# Patient Record
Sex: Female | Born: 1935
Health system: Southern US, Community
[De-identification: ages and names within clinical notes are randomized; demographics above are authoritative.]

## PROBLEM LIST (undated history)

## (undated) DIAGNOSIS — D649 Anemia, unspecified: Secondary | ICD-10-CM

## (undated) DIAGNOSIS — I4892 Unspecified atrial flutter: Secondary | ICD-10-CM

## (undated) DIAGNOSIS — I10 Essential (primary) hypertension: Secondary | ICD-10-CM

## (undated) DIAGNOSIS — I701 Atherosclerosis of renal artery: Secondary | ICD-10-CM

## (undated) DIAGNOSIS — M1990 Unspecified osteoarthritis, unspecified site: Secondary | ICD-10-CM

## (undated) DIAGNOSIS — I4891 Unspecified atrial fibrillation: Secondary | ICD-10-CM

## (undated) DIAGNOSIS — R002 Palpitations: Secondary | ICD-10-CM

## (undated) DIAGNOSIS — E039 Hypothyroidism, unspecified: Secondary | ICD-10-CM

## (undated) HISTORY — DX: Unspecified osteoarthritis, unspecified site: M19.90

## (undated) HISTORY — DX: Unspecified atrial fibrillation: I48.91

## (undated) HISTORY — DX: Palpitations: R00.2

## (undated) HISTORY — DX: Hypothyroidism, unspecified: E03.9

## (undated) HISTORY — PX: TUBAL LIGATION: SHX77

## (undated) HISTORY — DX: Unspecified atrial flutter: I48.92

## (undated) HISTORY — DX: Anemia, unspecified: D64.9

## (undated) HISTORY — PX: KNEE ARTHROSCOPY: SUR90

## (undated) HISTORY — PX: A FLUTTER ABLATION: SHX5348

## (undated) HISTORY — DX: Essential (primary) hypertension: I10

---

## 1997-05-11 ENCOUNTER — Other Ambulatory Visit: Admission: RE | Admit: 1997-05-11 | Discharge: 1997-05-11 | Payer: Self-pay | Admitting: Obstetrics and Gynecology

## 1998-05-31 ENCOUNTER — Other Ambulatory Visit: Admission: RE | Admit: 1998-05-31 | Discharge: 1998-05-31 | Payer: Self-pay | Admitting: Obstetrics and Gynecology

## 1999-06-01 ENCOUNTER — Other Ambulatory Visit: Admission: RE | Admit: 1999-06-01 | Discharge: 1999-06-01 | Payer: Self-pay | Admitting: Obstetrics and Gynecology

## 2000-06-04 ENCOUNTER — Other Ambulatory Visit: Admission: RE | Admit: 2000-06-04 | Discharge: 2000-06-04 | Payer: Self-pay | Admitting: Obstetrics and Gynecology

## 2001-05-15 ENCOUNTER — Ambulatory Visit (HOSPITAL_COMMUNITY): Admission: RE | Admit: 2001-05-15 | Discharge: 2001-05-15 | Payer: Self-pay | Admitting: Cardiology

## 2003-12-06 ENCOUNTER — Ambulatory Visit: Payer: Self-pay | Admitting: Cardiology

## 2004-04-09 ENCOUNTER — Ambulatory Visit: Payer: Self-pay | Admitting: Internal Medicine

## 2004-04-17 ENCOUNTER — Ambulatory Visit: Payer: Self-pay | Admitting: Internal Medicine

## 2004-05-24 ENCOUNTER — Ambulatory Visit: Payer: Self-pay | Admitting: Internal Medicine

## 2004-07-09 ENCOUNTER — Ambulatory Visit: Payer: Self-pay | Admitting: Internal Medicine

## 2005-05-16 ENCOUNTER — Ambulatory Visit: Payer: Self-pay | Admitting: Internal Medicine

## 2005-06-05 ENCOUNTER — Ambulatory Visit: Payer: Self-pay | Admitting: Cardiology

## 2005-06-05 ENCOUNTER — Ambulatory Visit: Payer: Self-pay | Admitting: Internal Medicine

## 2005-06-07 ENCOUNTER — Ambulatory Visit: Payer: Self-pay

## 2005-06-27 ENCOUNTER — Ambulatory Visit: Payer: Self-pay | Admitting: Internal Medicine

## 2005-07-04 ENCOUNTER — Ambulatory Visit (HOSPITAL_COMMUNITY): Admission: RE | Admit: 2005-07-04 | Discharge: 2005-07-04 | Payer: Self-pay | Admitting: Cardiovascular Disease

## 2005-12-03 ENCOUNTER — Ambulatory Visit: Payer: Self-pay | Admitting: *Deleted

## 2005-12-27 ENCOUNTER — Ambulatory Visit: Payer: Self-pay | Admitting: Internal Medicine

## 2006-01-30 ENCOUNTER — Ambulatory Visit (HOSPITAL_BASED_OUTPATIENT_CLINIC_OR_DEPARTMENT_OTHER): Admission: RE | Admit: 2006-01-30 | Discharge: 2006-01-30 | Payer: Self-pay | Admitting: Orthopedic Surgery

## 2006-04-04 ENCOUNTER — Ambulatory Visit: Payer: Self-pay | Admitting: Internal Medicine

## 2006-04-07 ENCOUNTER — Ambulatory Visit: Payer: Self-pay | Admitting: Internal Medicine

## 2006-04-10 ENCOUNTER — Ambulatory Visit: Payer: Self-pay | Admitting: Cardiology

## 2006-04-17 ENCOUNTER — Ambulatory Visit: Payer: Self-pay | Admitting: Cardiology

## 2006-04-23 ENCOUNTER — Ambulatory Visit: Payer: Self-pay | Admitting: Cardiovascular Disease

## 2006-04-30 ENCOUNTER — Ambulatory Visit: Payer: Self-pay | Admitting: Internal Medicine

## 2006-04-30 LAB — CONVERTED CEMR LAB
BUN: 18 mg/dL (ref 6–23)
Calcium: 9.7 mg/dL (ref 8.4–10.5)
Eosinophils Absolute: 0 10*3/uL (ref 0.0–0.6)
GFR calc non Af Amer: 52 mL/min
HCT: 33.7 % — ABNORMAL LOW (ref 36.0–46.0)
Hemoglobin: 11.7 g/dL — ABNORMAL LOW (ref 12.0–15.0)
Monocytes Absolute: 0.6 10*3/uL (ref 0.2–0.7)
Monocytes Relative: 8.6 % (ref 3.0–11.0)
Neutrophils Relative %: 67 % (ref 43.0–77.0)
Prothrombin Time: 20.8 s — ABNORMAL HIGH (ref 10.0–14.0)
RBC: 3.65 M/uL — ABNORMAL LOW (ref 3.87–5.11)
Sodium: 141 meq/L (ref 135–145)

## 2006-05-07 ENCOUNTER — Ambulatory Visit: Payer: Self-pay | Admitting: Cardiology

## 2006-05-09 ENCOUNTER — Ambulatory Visit (HOSPITAL_COMMUNITY): Admission: RE | Admit: 2006-05-09 | Discharge: 2006-05-10 | Payer: Self-pay | Admitting: Internal Medicine

## 2006-05-09 ENCOUNTER — Ambulatory Visit: Payer: Self-pay | Admitting: Internal Medicine

## 2006-05-12 ENCOUNTER — Ambulatory Visit: Payer: Self-pay | Admitting: Cardiology

## 2006-05-21 ENCOUNTER — Ambulatory Visit: Payer: Self-pay | Admitting: Internal Medicine

## 2006-06-06 ENCOUNTER — Ambulatory Visit: Payer: Self-pay | Admitting: Cardiology

## 2006-06-13 ENCOUNTER — Ambulatory Visit: Payer: Self-pay | Admitting: Internal Medicine

## 2006-07-03 ENCOUNTER — Ambulatory Visit: Payer: Self-pay | Admitting: Internal Medicine

## 2006-09-09 IMAGING — CT CT HEART WO/W CTA ONLY W/ CA
1 of 2 series · 8 of 20 positions shown, 10 images · IV contrast (APPLIED)
Comparison: none

CLINICAL DATA: 69-year-old female followed by Dr. Bushula, with history of paroxysmal atrial tachycardia for which she was on Flecainide.  Recent myocardial perfusion study showed possible reversibility in the anterior and apical wall.  She has a history of a normal coronary catheterization in Saturday April, 2001.
CARDIAC CT (INCLUDING CORONARY CALCIUM SCORING, CORONARY ARTERY CTA, AND LEFT VENTRICULAR FUNCTION EVALUATION) ? 07/04/05:
TECHNIQUE: The patient received 50 mg of oral Toprol XL on the night prior to and the morning of the examination.  Due to her PAT, she required 12.5 mg of intravenous Lopressor and was also given 150 mg of intravenous amiodarone prior to imaging.  0.4 mg of sublingual nitroglycerin was administered immediately prior to imaging.  The heart rate was in the low 70?s, and several premature atrial contractions occurred during the imaging sequence. 
After an AP topogram, unenhanced CT through the heart was performed at 3-mm collimation for the purposes of coronary calcium scoring.  20 cc of Omnipaque 350 were then utilized for a test bolus, with the region of interest curve drawn in the ascending aorta just distal to the coronary origins.  80 cc of Omnipaque 350 was then used for the coronary CTA, followed by a saline chaser bolus (100 cc Omnipaque 350 total).  Gantry rotation speed was 330 milliseconds, and 0.6-mm collimation was utilized with 0.4-mm overlap.

[Series 6: soft tissue · axial · 0.52mm/px · z∈[-265,-139]mm · 8 of 54 slices shown, 10 images]
[im 6/54  vessel]
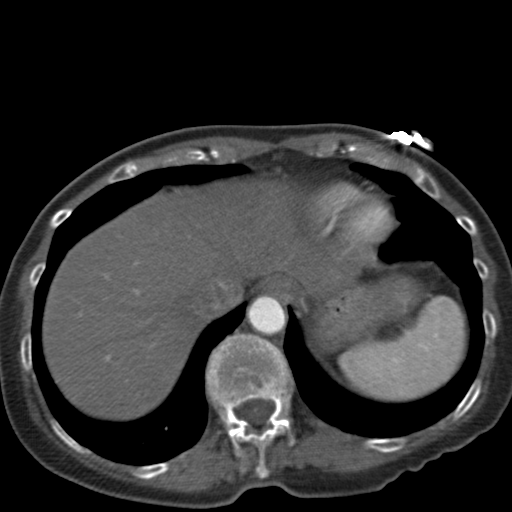
[im 6/54  lung]
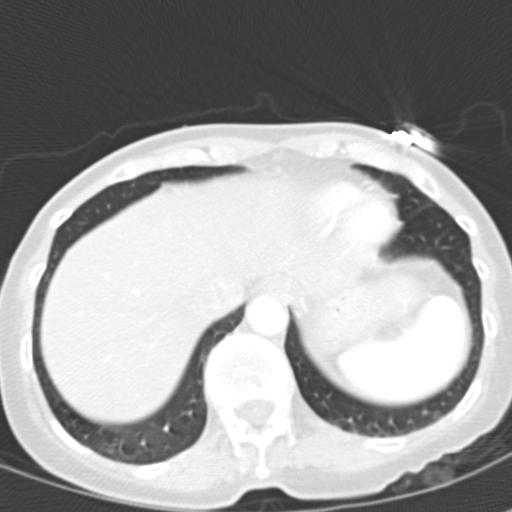
[im 12/54  vessel]
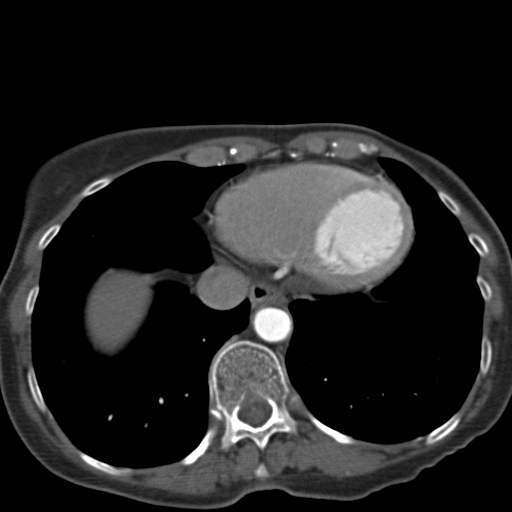
[im 18/54  vessel]
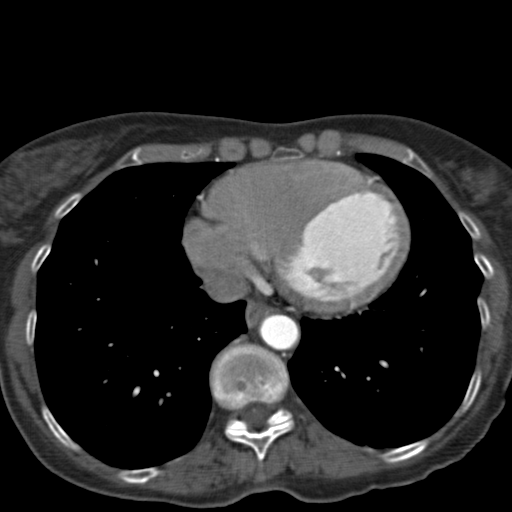
[im 24/54  vessel]
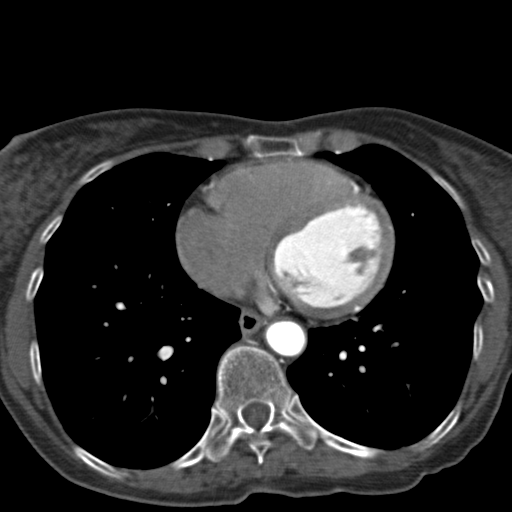
[im 30/54  vessel]
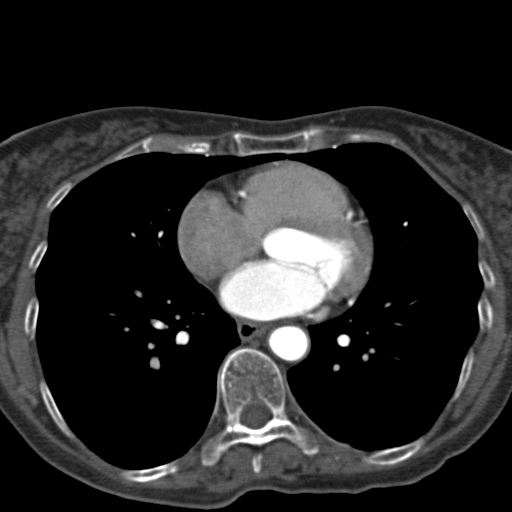
[im 30/54  lung]
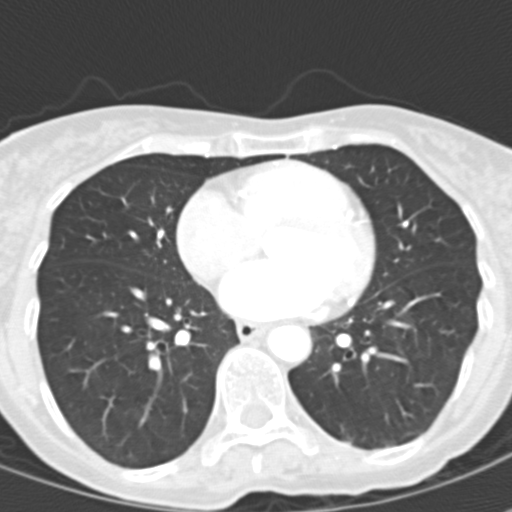
[im 36/54  vessel]
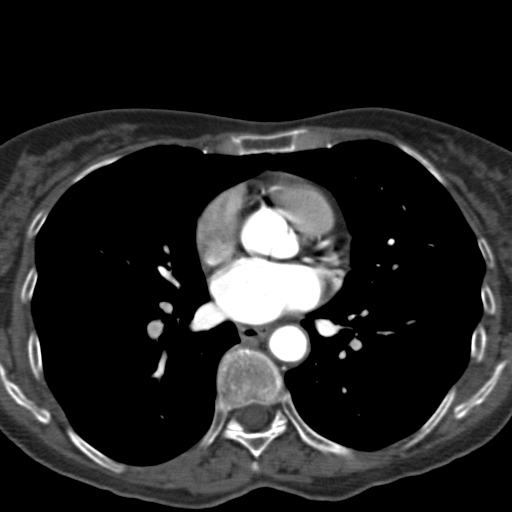
[im 42/54  vessel]
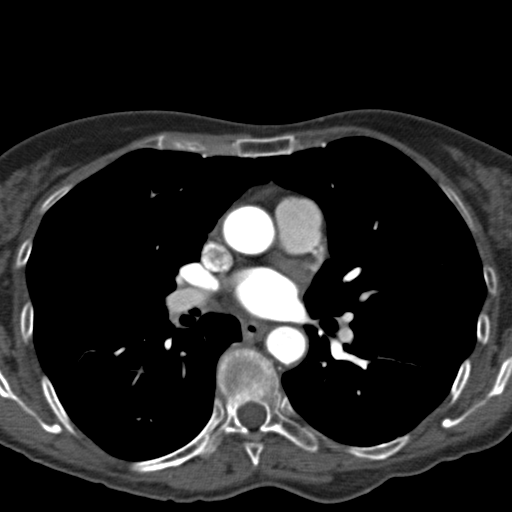
[im 48/54  vessel]
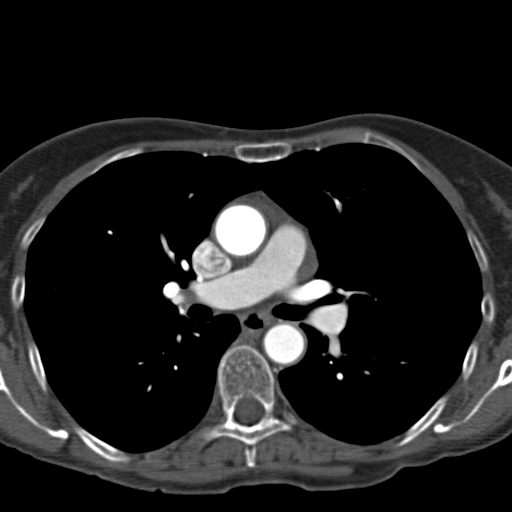

[8 of 20 positions shown; findings below may reference images not displayed]

FINDINGS: Coronary calcium score measured 0. 
Coronary CTA:  Because of the multiple premature atrial contractions, some misregistration artifact occurred, but a diagnostic study was obtained.  
The left main coronary artery arises normally from the left coronary cusp.  The left anterior descending artery is widely patent.  There are two diagonal branches arising from the LAD, which are normal. 
The left circumflex coronary artery is free of significant disease.  There are two obtuse marginals, which are also normal. 
The right coronary artery is widely patent throughout its course.  I believe a small posterior descending artery arises from the right coronary artery, making her right dominant. 
The aortic valve is trileaflet, and there is no evidence of aortic stenosis.  The left atrial appendage is normal in appearance.  There is an accessory right upper pulmonary vein.
LV function:  Time volume analysis estimated the resting left ventricular ejection fraction at 46%.
Extracardiac findings:  There is no significant lymphadenopathy in the visualized mediastinum.  The visualized upper abdomen is normal for the early arterial phase of enhancement.  The visualized lungs are clear, and there are no pleural effusions.
IMPRESSION: 1.  Coronary artery calcium Agatston score of 0.
2.  Normal appearing coronary arteries throughout. 
3.  Estimated resting left ventricular ejection fraction 46% with normal wall motion. 
4.  Accessory right upper pulmonary vein. 
This examination was performed in conjunction with Dr. Renars Unwin of [place] [facility]. [REDACTED] to [place] this study.

## 2006-11-13 ENCOUNTER — Ambulatory Visit: Payer: Self-pay | Admitting: Internal Medicine

## 2006-12-05 ENCOUNTER — Ambulatory Visit: Payer: Self-pay | Admitting: Internal Medicine

## 2007-01-20 ENCOUNTER — Ambulatory Visit: Payer: Self-pay | Admitting: Cardiovascular Disease

## 2007-02-12 ENCOUNTER — Ambulatory Visit: Payer: Self-pay | Admitting: Internal Medicine

## 2007-11-30 ENCOUNTER — Ambulatory Visit: Payer: Self-pay | Admitting: Internal Medicine

## 2007-11-30 LAB — CONVERTED CEMR LAB
Basophils Relative: 0.6 % (ref 0.0–3.0)
Eosinophils Relative: 1.4 % (ref 0.0–5.0)
Hemoglobin: 11.1 g/dL — ABNORMAL LOW (ref 12.0–15.0)
Lymphocytes Relative: 23.4 % (ref 12.0–46.0)
MCV: 93.4 fL (ref 78.0–100.0)
Monocytes Absolute: 0.3 10*3/uL (ref 0.1–1.0)
Monocytes Relative: 3.9 % (ref 3.0–12.0)
Neutro Abs: 5.4 10*3/uL (ref 1.4–7.7)
Platelets: 183 10*3/uL (ref 150–400)
RBC: 3.38 M/uL — ABNORMAL LOW (ref 3.87–5.11)
TSH: 2.15 microintl units/mL (ref 0.35–5.50)

## 2008-04-01 ENCOUNTER — Ambulatory Visit: Payer: Self-pay | Admitting: Internal Medicine

## 2008-05-04 ENCOUNTER — Other Ambulatory Visit: Payer: Self-pay | Admitting: Internal Medicine

## 2008-06-22 ENCOUNTER — Telehealth: Payer: Self-pay | Admitting: Internal Medicine

## 2008-08-02 ENCOUNTER — Telehealth: Payer: Self-pay | Admitting: Internal Medicine

## 2008-09-03 DIAGNOSIS — I10 Essential (primary) hypertension: Secondary | ICD-10-CM | POA: Insufficient documentation

## 2008-09-03 DIAGNOSIS — Z9889 Other specified postprocedural states: Secondary | ICD-10-CM | POA: Insufficient documentation

## 2008-09-03 DIAGNOSIS — Z8679 Personal history of other diseases of the circulatory system: Secondary | ICD-10-CM | POA: Insufficient documentation

## 2008-10-25 ENCOUNTER — Telehealth: Payer: Self-pay | Admitting: Internal Medicine

## 2008-11-14 ENCOUNTER — Telehealth: Payer: Self-pay | Admitting: Internal Medicine

## 2008-11-15 ENCOUNTER — Ambulatory Visit: Payer: Self-pay | Admitting: Internal Medicine

## 2008-11-16 ENCOUNTER — Ambulatory Visit: Payer: Self-pay | Admitting: Internal Medicine

## 2008-11-16 DIAGNOSIS — E875 Hyperkalemia: Secondary | ICD-10-CM | POA: Insufficient documentation

## 2008-11-17 LAB — CONVERTED CEMR LAB
BUN: 26 mg/dL — ABNORMAL HIGH (ref 6–23)
CO2: 29 meq/L (ref 19–32)
CO2: 27 meq/L (ref 19–32)
Calcium: 9.8 mg/dL (ref 8.4–10.5)
Calcium: 9.3 mg/dL (ref 8.4–10.5)
Cholesterol: 208 mg/dL — ABNORMAL HIGH (ref 0–200)
Creatinine, Ser: 1.6 mg/dL — ABNORMAL HIGH (ref 0.4–1.2)
Direct LDL: 84.9 mg/dL
Glucose, Bld: 75 mg/dL (ref 70–99)
HDL: 100.5 mg/dL (ref >39.00)
Potassium: 5.7 meq/L — ABNORMAL HIGH (ref 3.5–5.1)
Potassium: 4.7 meq/L (ref 3.5–5.1)
Sodium: 141 meq/L (ref 135–145)
Sodium: 140 meq/L (ref 135–145)
Total CHOL/HDL Ratio: 2
Triglycerides: 50 mg/dL (ref 0.0–149.0)

## 2008-11-18 ENCOUNTER — Ambulatory Visit: Payer: Self-pay | Admitting: Internal Medicine

## 2009-01-12 ENCOUNTER — Telehealth: Payer: Self-pay | Admitting: Internal Medicine

## 2009-03-16 ENCOUNTER — Encounter (INDEPENDENT_AMBULATORY_CARE_PROVIDER_SITE_OTHER): Payer: Self-pay | Admitting: *Deleted

## 2009-04-11 ENCOUNTER — Telehealth: Payer: Self-pay | Admitting: Internal Medicine

## 2009-05-03 ENCOUNTER — Encounter: Payer: Self-pay | Admitting: Internal Medicine

## 2009-05-25 ENCOUNTER — Encounter: Payer: Self-pay | Admitting: Internal Medicine

## 2009-06-01 ENCOUNTER — Ambulatory Visit: Payer: Self-pay | Admitting: Internal Medicine

## 2009-12-04 ENCOUNTER — Ambulatory Visit: Payer: Self-pay | Admitting: Internal Medicine

## 2009-12-20 ENCOUNTER — Ambulatory Visit: Payer: Self-pay | Admitting: Internal Medicine

## 2009-12-22 ENCOUNTER — Telehealth: Payer: Self-pay | Admitting: Internal Medicine

## 2009-12-22 LAB — CONVERTED CEMR LAB
BUN: 15 mg/dL
CO2: 29 meq/L
Calcium: 9.5 mg/dL
Chloride: 104 meq/L
Creatinine, Ser: 1.3 mg/dL — ABNORMAL HIGH
GFR calc non Af Amer: 41.28 mL/min
Glucose, Bld: 75 mg/dL
Potassium: 4.8 meq/L
Sodium: 143 meq/L

## 2010-02-22 NOTE — Progress Notes (Signed)
Summary: refill meds   Phone Note Refill Request Call back at Home Phone [phone] Call back at [phone] Message from:  Patient on April 11, 2009 4:07 PM  Refills Requested: Medication #1:  LISINOPRIL 10 MG TABS Take one tablet by mouth daily [facility] [place]    Method Requested: Fax to Local Pharmacy Initial call taken by: Lorne Skeens,  April 11, 2009 4:07 PM    Prescriptions: LISINOPRIL 10 MG TABS (LISINOPRIL) Take one tablet by mouth daily  #30 x 6   Entered by:   Burnett Kanaris, CNA   Authorized by:   Sherrill Raring, MD, [facility]   Signed by:   Burnett Kanaris, CNA on 04/12/2009   Method used:   [facility]  [address] [id number]       [address]       [place], [place]  [address]       Ph: [phone] or [phone]       Fax: [phone]   RxID:   [id number]

## 2010-02-22 NOTE — Assessment & Plan Note (Signed)
Summary: 6 month ov/sl  Medications Added LISINOPRIL 10 MG TABS (LISINOPRIL) Take one-half- tablet by mouth daily FLECAINIDE ACETATE 50 MG TABS (FLECAINIDE ACETATE) Take one tablet by mouth every 12 hours MOBIC 15 MG TABS (MELOXICAM) matbe three times a week * COSAMINE DS multi vit for bones      Allergies Added: NKDA  Visit Type:  Follow-up Primary Provider:  Dr Bufford Spikes  CC:  no complaints.  History of Present Illness:  [name] is a 75 year old with a history of hypertension, atrial flutter (s/p ablation) and atrial fibrillation.  She is on flecanide. Since I saw her last fall she has been doing well.  She denies palpitations.  Breathing is good.  No chest pain.  She is planning to increase her activity this summer.  Current Medications (verified): 1)  Lisinopril 10 Mg Tabs (Lisinopril) .... Take One-Half- Tablet By Mouth Daily 2)  Flecainide Acetate 50 Mg Tabs (Flecainide Acetate) .... Take One Tablet By Mouth Every 12 Hours 3)  Aspirin 81 Mg Tbec (Aspirin) .... Take One Tablet By Mouth Daily 4)  Calcium Carbonate   Powd (Calcium Carbonate) .Marland Kitchen.. 1 Tab Once Daily 5)  Multivitamins   Tabs (Multiple Vitamin) .Marland Kitchen.. 1 Tab Once Daily 6)  Fosamax 70 Mg Tabs (Alendronate Sodium) .... Q Weekly 7)  Prempro 0.3-1.5 Mg Tabs (Conj Estrog-Medroxyprogest Ace) .Marland Kitchen.. 1 Tab Twice A Week 8)  Aleve 220 Mg Tabs (Naproxen Sodium) .... As Needed 9)  Mobic 15 Mg Tabs (Meloxicam) .... Matbe Three Times A Week 10)  Synthroid 50 Mcg Tabs (Levothyroxine Sodium) .Marland Kitchen.. 1 Tablet Every Day 11)  Dilacor Xr 240 Mg Xr24h-Cap (Diltiazem Hcl) .Marland Kitchen.. 1 Tablet Every Day 12)  Cosamine Ds .... Multi Vit For Bones  Allergies (verified): No Known Drug Allergies  Past History:  Past medical, surgical, family and social histories (including risk factors) reviewed, and no changes noted (except as noted below).  Past Medical History: Reviewed history from 11/18/2008 and no changes required. Atrial flutter, s/p  ablation PALPITATIONS, HX OF (ICD-V12.50) HYPERTENSION (ICD-401.9)  Atrial fibrillation  Past Surgical History: Reviewed history from 09/03/2008 and no changes required. * CATHETER ABLATION of atrial flutter ARTHROSCOPY, KNEE, HX OF (ICD-V45.89)  Family History: Reviewed history from 09/03/2008 and no changes required. Father:Died from stroke age 31 Mother:Died from stroke age 36  Social History: Reviewed history from 09/03/2008 and no changes required. Retired  Married  1 child No tobacco  Review of Systems       all systems reviewed.  Negative to the above problem except as noted above.  Note labs in April showed. K of 5.5, Cr of 1.53.  Patient admits to not drinking much fluid during the day.  Vital Signs:  Patient profile:   75 year old female Height:      68 inches Weight:      140 pounds BMI:     21.36 Pulse rate:   57 / minute BP sitting:   115 / 69  (left arm) Cuff size:   regular  Vitals Entered By: Burnett Kanaris, CNA (Jun 01, 2009 2:24 PM)  Physical Exam  Additional Exam:  Patient is in NAD HEENT:  Normocephalic, atraumatic. EOMI, PERRLA.  Neck: JVP is normal. No thyromegaly. No bruits.  Lungs: clear to auscultation. No rales no wheezes.  Heart: Regular rate and rhythm with frequent isolated skips. Normal S1, S2. No S3.   No significant murmurs. PMI not displaced.  Abdomen:  Supple, nontender. Normal bowel sounds. No  masses. No hepatomegaly.  Extremities:   Good distal pulses throughout. No lower extremity edema.  Musculoskeletal :moving all extremities.  Neuro:   alert and oriented x3.    EKG  Procedure date:  06/01/2009  Findings:      NSR.   80 bpm.   Occasional PAC.   RBBB.  Impression & Recommendations:  Problem # 1:  PALPITATIONS, HX OF (ICD-V12.50) Patient is s/p atiral flutter ablation.  She remains on flecanide.  symptomatically doing well.  I would keep on same regimen.  She has had labs repeated by dr. Shela Commons office.  She will make  sure they are faxed here. I encouraged her to increase her fluid intake, encouraged her to  increase her activity.  Problem # 2:  HYPERPOTASSEMIA (ICD-276.7) Lab was repeated.  Pending.  Problem # 3:  ATRIAL FIBRILLATION, HX OF (ICD-V12.59) COntinue meds.  Problem # 4:  HYPERTENSION (ICD-401.9) Adequate control  Other Orders: EKG w/ Interpretation (93000)

## 2010-02-22 NOTE — Assessment & Plan Note (Signed)
Summary: 1 YR F/U  Medications Added MOBIC 15 MG TABS (MELOXICAM) 1 tablet, as needed      Allergies Added: NKDA  Visit Type:  Follow-up Primary Provider:  Dr Bufford Spikes   History of Present Illness: [name] is a 75 year old with a history of hypertension, atrial flutter (s/p ablation) and atrial fibrillation.  She is on flecanide. Since I saw she deneis palpitations.  NO chest pain.  No SOB.  No dizziness  Current Medications (verified): 1)  Lisinopril 10 Mg Tabs (Lisinopril) .... Take One-Half- Tablet By Mouth Daily 2)  Flecainide Acetate 50 Mg Tabs (Flecainide Acetate) .... Take One Tablet By Mouth Every 12 Hours 3)  Aspirin 81 Mg Tbec (Aspirin) .... Take One Tablet By Mouth Daily 4)  Calcium Carbonate   Powd (Calcium Carbonate) .Marland Kitchen.. 1 Tab Once Daily 5)  Multivitamins   Tabs (Multiple Vitamin) .Marland Kitchen.. 1 Tab Once Daily 6)  Fosamax 70 Mg Tabs (Alendronate Sodium) .... Q Weekly 7)  Prempro 0.3-1.5 Mg Tabs (Conj Estrog-Medroxyprogest Ace) .Marland Kitchen.. 1 Tab Twice A Week 8)  Synthroid 50 Mcg Tabs (Levothyroxine Sodium) .Marland Kitchen.. 1 Tablet Every Day 9)  Dilacor Xr 240 Mg Xr24h-Cap (Diltiazem Hcl) .Marland Kitchen.. 1 Tablet Every Day 10)  Cosamine Ds .... Multi Vit For Bones 11)  Mobic 15 Mg Tabs (Meloxicam) .Marland Kitchen.. 1 Tablet, As Needed  Allergies (verified): No Known Drug Allergies  Past History:  Past medical, surgical, family and social histories (including risk factors) reviewed, and no changes noted (except as noted below).  Past Medical History: Reviewed history from 11/18/2008 and no changes required. Atrial flutter, s/p ablation PALPITATIONS, HX OF (ICD-V12.50) HYPERTENSION (ICD-401.9)  Atrial fibrillation  Past Surgical History: Reviewed history from 09/03/2008 and no changes required. * CATHETER ABLATION of atrial flutter ARTHROSCOPY, KNEE, HX OF (ICD-V45.89)  Family History: Reviewed history from 09/03/2008 and no changes required. Father:Died from stroke age 58 Mother:Died from stroke  age 38  Social History: Reviewed history from 09/03/2008 and no changes required. Retired  Married  1 child No tobacco  Review of Systems       All systems reviewed.  Neg to above problem.  Vital Signs:  Patient profile:   75 year old female Height:      68 inches Weight:      143 pounds BMI:     21.82 Pulse rate:   56 / minute BP sitting:   150 / 82  (left arm) Cuff size:   regular  Vitals Entered By: Caralee Ates CMA (December 04, 2009 4:32 PM)  Physical Exam  Additional Exam:  Patinet is in NAD HEENT:  Normocephalic, atraumatic. EOMI, PERRLA.  Neck: JVP is normal. No thyromegaly. No bruits.  Lungs: clear to auscultation. No rales no wheezes.  Heart: Regularly rate and rhythm. Normal S1, S2. No S3.   No significant murmurs. PMI not displaced.  Abdomen:  Supple, nontender. Normal bowel sounds. No masses. No hepatomegaly.  Extremities:   Good distal pulses throughout. No lower extremity edema.  Musculoskeletal :moving all extremities.  Neuro:   alert and oriented x3.    EKG  Procedure date:  12/04/2009  Findings:      NSR with PACs  Impression & Recommendations:  Problem # 1:  ATRIAL FIBRILLATION, HX OF (ICD-V12.59) Remains in SR with PACs.  I would continue on current regimen.  Problem # 2:  HYPERTENSION (ICD-401.9) Keep on same regimen.  BP is up today but at home is much lower.  Occas dizzy Follow at  home .  Bring cuff to next doctors visit.  If persistently low could stop lisinopril Will need to f/u on KCL Her updated medication list for this problem includes:    Lisinopril 10 Mg Tabs (Lisinopril) .Marland Kitchen... Take one-half- tablet by mouth daily    Aspirin 81 Mg Tbec (Aspirin) .Marland Kitchen... Take one tablet by mouth daily    Dilacor Xr 240 Mg Xr24h-cap (Diltiazem hcl) .Marland Kitchen... 1 tablet every day  Appended Document: 1 YR F/U LMOM to schedule BMET per Dr.Itha Kroeker.  Appended Document: 1 YR F/U Called patient...she will have BMET on 11/30.

## 2010-02-22 NOTE — Letter (Signed)
Summary: Appointment - Reminder 2  [facility], Main Office  [address]   [place], [place] [address]   Phone: [phone]  Fax: [phone]     March 16, 2009 MRN: [id number]   Amy [name] [address] [place], [place]  [address]   Dear Ms. Gaughran,  Our records indicate that it is time to schedule a follow-up appointment with Dr. Tenny Craw. It is very important that we reach you to schedule this appointment. We look forward to participating in your health care needs. Please contact us at the number listed above at your earliest convenience to schedule your appointment.  If you are unable to make an appointment at this time, give [place] a call so we can update our records.     Sincerely,   Migdalia Dk [facility] Scheduling Team

## 2010-02-22 NOTE — Progress Notes (Signed)
Summary: pt rtn call   Phone Note Call from Patient Call back at Home Phone [phone]   Caller: Patient Reason for Call: Talk to Nurse, Talk to Doctor, Lab or Test Results Summary of Call: pt rtn call  Initial call taken by: Omer Jack,  December 22, 2009 12:43 PM  Follow-up for Phone Call        12/22/09--1240pm--results of BMET given to pt and advised to continue with present msd regime--pt agrees--nt Follow-up by: Ledon Snare, RN,  December 22, 2009 12:50 PM

## 2010-03-16 ENCOUNTER — Ambulatory Visit: Payer: Self-pay | Admitting: Internal Medicine

## 2010-04-04 ENCOUNTER — Encounter: Payer: Self-pay | Admitting: Internal Medicine

## 2010-04-09 ENCOUNTER — Ambulatory Visit: Payer: Self-pay | Admitting: Internal Medicine

## 2010-05-09 ENCOUNTER — Other Ambulatory Visit: Payer: Self-pay | Admitting: Internal Medicine

## 2010-05-18 ENCOUNTER — Encounter: Payer: Self-pay | Admitting: Internal Medicine

## 2010-05-21 ENCOUNTER — Encounter: Payer: Self-pay | Admitting: Internal Medicine

## 2010-05-21 ENCOUNTER — Ambulatory Visit (INDEPENDENT_AMBULATORY_CARE_PROVIDER_SITE_OTHER): Payer: Medicare Other | Admitting: Internal Medicine

## 2010-05-21 DIAGNOSIS — I119 Hypertensive heart disease without heart failure: Secondary | ICD-10-CM

## 2010-05-21 DIAGNOSIS — Z8679 Personal history of other diseases of the circulatory system: Secondary | ICD-10-CM

## 2010-05-21 DIAGNOSIS — R002 Palpitations: Secondary | ICD-10-CM

## 2010-05-21 DIAGNOSIS — I10 Essential (primary) hypertension: Secondary | ICD-10-CM

## 2010-05-21 NOTE — Patient Instructions (Signed)
Your physician has recommended that you wear a holter monitor. Holter monitors are medical devices that record the heart's electrical activity. Doctors most often use these monitors to diagnose arrhythmias. Arrhythmias are problems with the speed or rhythm of the heartbeat. The monitor is a small, portable device. You can wear one while you do your normal daily activities. This is usually used to diagnose what is causing palpitations/syncope (passing out).  Your physician recommends that you schedule a follow-up appointment in: November 2012

## 2010-05-22 NOTE — Assessment & Plan Note (Signed)
Adequate control.  Continue.

## 2010-05-22 NOTE — Assessment & Plan Note (Signed)
Will set up for 48 hour holter monitor.    Ectopy is more pronounced.  On ASA

## 2010-05-22 NOTE — Progress Notes (Signed)
HPI Patient is a 75 year old with a history of hypertension, atrial flutter (s/p ablation) and atrial fibrllation.  She is on Flecanide.  I saw her last in the Fall, 2011. Since seen she has done OK   She denies signif palpitations.  No dizziness.  No SOB.  No Known Allergies  Current Outpatient Prescriptions  Medication Sig Dispense Refill  . alendronate (FOSAMAX) 70 MG tablet Take 70 mg by mouth every 7 (seven) days. Take with a full glass of water on an empty stomach.       Marland Kitchen aspirin 81 MG tablet Take 81 mg by mouth daily.        . Calcium Carbonate POWD 1 tablet by Does not apply route daily.        Marland Kitchen diltiazem (DILACOR XR) 240 MG 24 hr capsule Take 240 mg by mouth daily.        . flecainide (TAMBOCOR) 50 MG tablet TAKE ONE TABLET BY MOUTH EVERY 12 HOURS  60 tablet  6  . levothyroxine (SYNTHROID) 50 MCG tablet Take 50 mcg by mouth daily.        . meloxicam (MOBIC) 15 MG tablet Take 15 mg by mouth as needed.        . Multiple Vitamin (MULTIVITAMIN PO) Take by mouth. Multivitamin for bones       . Multiple Vitamin (MULTIVITAMIN PO) Take by mouth daily.        Marland Kitchen estrogen, conjugated,-medroxyprogesterone (PREMPRO) 0.3-1.5 MG per tablet Take 1 tablet by mouth 2 (two) times a week. ON HOLD      . lisinopril (PRINIVIL,ZESTRIL) 10 MG tablet 1/2 tablet by mouth daily         Past Medical History  Diagnosis Date  . Atrial flutter     s/p ablation  . Palpitations   . HTN (hypertension)   . Atrial fibrillation     Past Surgical History  Procedure Date  . A flutter ablation   . Knee arthroscopy     Family History  Problem Relation Age of Onset  . Stroke Father 10    died from stroke at age 44  . Stroke Mother 4    died from stroke at age 53    History   Social History  . Marital Status: Married    Spouse Name: N/A    Number of Children: 1  . Years of Education: N/A   Occupational History  . Retired    Social History Main Topics  . Smoking status: Never Smoker   .  Smokeless tobacco: Not on file  . Alcohol Use: Not on file  . Drug Use: Not on file  . Sexually Active: Not on file   Other Topics Concern  . Not on file   Social History Narrative  . No narrative on file    Review of Systems:  All systems reviewed.  They are negative to the above problem except as previously stated.  Vital Signs: BP 138/68  Pulse 77  Ht 5\' 8"  (1.727 m)  Wt 140 lb (63.504 kg)  BMI 21.29 kg/m2  Physical Exam  HEENT:  Normocephalic, atraumatic. EOMI, PERRLA.  Neck: JVP is normal. No thyromegaly. No bruits.  Lungs: clear to auscultation. No rales no wheezes.  Heart: Initally very irregular the regular with frequent skips. Normal S1, S2. No S3.   No significant murmurs. PMI not displaced.  Abdomen:  Supple, nontender. Normal bowel sounds. No masses. No hepatomegaly.  Extremities:   Good distal pulses  throughout. No lower extremity edema.  Musculoskeletal :moving all extremities.  Neuro:   alert and oriented x3.  CN II-XII grossly intact. EKG:  NSR  77 bpm.  RBBB>  Occasional PAC   Assessment and Plan:

## 2010-05-28 ENCOUNTER — Encounter (INDEPENDENT_AMBULATORY_CARE_PROVIDER_SITE_OTHER): Payer: Medicare Other

## 2010-05-28 DIAGNOSIS — R002 Palpitations: Secondary | ICD-10-CM

## 2010-05-31 ENCOUNTER — Telehealth: Payer: Self-pay | Admitting: *Deleted

## 2010-05-31 DIAGNOSIS — I4891 Unspecified atrial fibrillation: Secondary | ICD-10-CM

## 2010-05-31 MED ORDER — WARFARIN SODIUM 5 MG PO TABS: ORAL_TABLET | ORAL | Status: DC

## 2010-05-31 NOTE — Telephone Encounter (Signed)
Dr.Ross called patient concerning her holter monitor that showed atrial fib and the need to start Coumadin. [facility] [place].

## 2010-06-04 ENCOUNTER — Telehealth: Payer: Self-pay | Admitting: Internal Medicine

## 2010-06-04 NOTE — Telephone Encounter (Signed)
Pt wants to know what else she needs to do because she has not heard anything since she was put on coumadin

## 2010-06-04 NOTE — Telephone Encounter (Signed)
Called patient and advised per Dr.Ross to increase Flecainide to 75mg  BID check INR on 5/15 and have 12 lead EKG on 5/18. Patient verbalized understanding.

## 2010-06-05 ENCOUNTER — Ambulatory Visit (INDEPENDENT_AMBULATORY_CARE_PROVIDER_SITE_OTHER): Payer: Medicare Other | Admitting: *Deleted

## 2010-06-05 DIAGNOSIS — I4891 Unspecified atrial fibrillation: Secondary | ICD-10-CM | POA: Insufficient documentation

## 2010-06-05 NOTE — Assessment & Plan Note (Signed)
[facility]                       [place] CARDIOLOGY OFFICE NOTE   Amy [name], Amy [name]                       MRN:          [id number]  DATE:11/13/2006                            DOB:          [birth year]    IDENTIFICATION:  Amy [name] is a 75 year old woman with a history of  atrial arrhythmia.  She is status post atrial flutter ablation.  I last  saw her back in June.   In the interval, she still has complaints of palpitations.  She tried to  get an appointment actually in July but could not, so she contacted her  primary M.D. who increased her verapamil to 240.  She says when she  explains the palpitations, they last about 30 seconds.  She gets them 2-  4 times per week, sometimes less than that.  She gets some dizziness or  spacy feeling with these.  Her blood pressure at home has been 110 to  130.   CURRENT MEDICATIONS:  1. Aspirin 81.  2. Fosamax.  3. Multivitamin.  4. Calcium with D.  5. Hydrochlorothiazide 12.5.  6. Verapamil 240.  7. Note, recently started estrogen replacement and hot flashes are      improving.   PHYSICAL EXAMINATION:  GENERAL:  The patient is in no distress.  VITAL SIGNS:  Blood pressure is 112/72, pulse is 78, weight 141 up from  137 on last visit.  LUNGS:  Clear.  CARDIAC:  Regular rate and rhythm.  S1 S2.  No S3.  No murmurs.  ABDOMEN:  Benign.   IMPRESSION:  1. Atrial arrhythmias.  Would have the patient set up for an event      monitor, continue on current regimen.  2. Floating feeling again.  I am not sure what these represent but      will follow and watch event monitor.  3. Health care maintenance.  Excellent lipid control.   Followup in 6 months.  But, I will be in touch with her regarding the  monitor results.     Pricilla Riffle, MD, [facility]  Electronically Signed    PVR/MedQ  DD: 11/13/2006  DT: 11/14/2006  Job #: [id number]

## 2010-06-05 NOTE — Assessment & Plan Note (Signed)
[facility]                         ELECTROPHYSIOLOGY OFFICE NOTE   Amy [name], Amy [name]                       MRN:          [id number]  DATE:06/13/2006                            DOB:          [birth year]    Amy [name] returns today for followup.  She is a very pleasant middle-  aged woman with a history of atrial flutter who underwent  electrophysiologic study and catheter ablation back in April.  At that  time we saw nonsustained atypical flutter of unclear clinical  significance.  The patient has had no palpitations since we performed  catheter ablation.  She states that she checks her heart rate and blood  pressure on a regular basis and has had no heart rates above 100.  When  she was out of rhythm, her heart rate was over 120 b.p.m.  She has had  no syncope and otherwise feeling well.   PHYSICAL EXAM:  She is a pleasant, well-appearing woman in no acute  distress.  The blood pressure was 142/72, the pulse was 81 and regular,  the respirations were 18.  NECK:  No jugular venous distention.  LUNGS:  Clear bilaterally to auscultation.  There were no wheezes, rales  or rhonchi.  CARDIOVASCULAR EXAM:  A regular rate and rhythm with normal S1 and a  split S2.  EXTREMITIES:  No edema.   MEDICATIONS:  1. Aspirin 81 mg daily.  2. Fosamax 70 a day.  3. Multivitamin.  4. Calcium supplement.  5. HCTZ 12.5 daily.  6. Verapamil 180 mg daily.  7. Flecainide 50 mg t.i.d.  8. Coumadin.   IMPRESSION:  1. Atrial flutter status post catheter ablation.  2. Hypertension.  3. Chronic Coumadin therapy secondary to number 1.  4. Hypertension.   DISCUSSION:  [name] has had problems with compliance in the past.  She appears to be maintaining sinus rhythm very nicely.  She is now a  month out from her catheter ablation.  I have recommended that she go  ahead and stop her Coumadin and stop her low dose flecainide at present.  If she develops atrial  fibrillation, then I would have her start back  her flecainide as well as her Coumadin therapy.  I will plan to see her  back in the office on a p.r.n. basis.  She is  instructed to call us if she notices that her cardiac monitor heart rate  is above 100 b.p.m. or if she feels her heart beating irregularly.     [name]. Ladona Ridgel, MD  Electronically Signed    GWT/MedQ  DD: 06/13/2006  DT: 06/13/2006  Job #: [id number]   cc:   Samuel Jester

## 2010-06-05 NOTE — Assessment & Plan Note (Signed)
[facility] HEALTHCARE                            CARDIOLOGY OFFICE NOTE   TILIA, FASO                       MRN:          [id number]  DATE:11/30/2007                            DOB:          12-21-1935    IDENTIFICATION:  Amy [name] is a 75 year old woman.  I last saw her back  in January of this year.  She has a history of palpitations, atrial  flutter, status post ablation.  Also, history of hypertension.   When I saw her last, I recommended, she continue on flecainide and  Cartia.  Note, she had a vent monitor in December 2008, that showed no  AFib.  I recommended, she continue on 50 b.i.d. of flecainide.   Since seen, [place].  She  said for the past few weeks she was feeling more tired.  Her diastolic  pressure was running a little low.  She was concerned she backed down on  her Cartia to 180 mg (from 360).  She is also taking her flecainide only  once a day 100 mg.   She thinks her energy might be picking up now.  She denies any  palpitations.   CURRENT MEDICINES:  1. Aspirin 81 mg.  2. Fosamax 70 q. week.  3. Multivitamin.  4. Calcium.  5. Hormone with Prempro.  6. Lisinopril 10.  7. Dilacor 180.  8. Vitamin D.  9. Tylenol PM.  10.Aleve.   PHYSICAL EXAMINATION:  GENERAL:  On exam, the patient is in no distress  at rest.  VITAL SIGNS:  Blood pressure is 143/49, pulse is 70 and regular, weight  143.  NECK:  JVP is normal.  No bruits.  No thyromegaly.  LUNGS:  Clear without rales or wheezes.  CARDIAC:  Regular rate and rhythm, S1 and S2.  No S3, no murmurs.  ABDOMEN:  Benign.  No hepatomegaly.  EXTREMITIES:  Good pulses.  No edema.   A 12-lead EKG shows normal sinus rhythm 70 beats per minute.  Right  bundle-branch block with a PR interval of 156 milliseconds.   1. Atrial arrhythmias.  Clinically, it seems to be doing better.  I      discussed with Amy [name] she really should be on.  Flecainide 50      b.i.d. and I recommended this.  2. Hypertension, adequate control.  I am not convinced the diastolic      where it is, is explaining her symptoms.  We will check a thyroid      as well as CBC today.  Forward to Dr. Charm Barges.   I encouraged her to stay active.  Otherwise, I will plan followup for 6  months' time.     Amy Riffle, MD, [facility]  Electronically Signed    PVR/MedQ  DD: 11/30/2007  DT: 12/01/2007  Job #: [id number]   cc:   Amy [name]

## 2010-06-05 NOTE — Assessment & Plan Note (Signed)
[facility]ALTHC[facility]                       [place] CARDIOLOGY OFFICE NOTE   VERGENE, MARLAND                       MRN:          [id number]  DATE:07/03/2006                            DOB:          1935/09/30    IDENTIFICATION:  Ms. Davern is a 75 year old woman with a history of  atrial arrhythmias.  She is now status post flutter ablation.  She was  last seen in cardiology clinic by [name]. Ladona Ridgel, MD, back on May 23.   In the interval she denies any significant palpitations.  She says  sometimes in the morning she will get up and so some chores before  eating and before taking her medicines and she just feels kind of funny  in her head.  She checks her pulse, her blood pressure, they are okay.  She feels just a little goofy.   At other times she feels fine.  Again, this is the only time of the day.  Note, she is off hormone replacement therapy.   CURRENT MEDICATIONS:  1. Aspirin 81 mg daily.  2. Fosamax 70 mg each week.  3. Multivitamin.  4. Calcium with vitamin D.  5. Hydrochlorothiazide 12.5 mg daily.  6. Verapamil 180 mg daily.   PHYSICAL EXAMINATION:  GENERAL:  On exam, the patient is in no distress.  VITAL SIGNS:  Blood pressure is 118/62, pulse is 76, weight 137.  LUNGS:  Clear.  NECK:  JVP is normal.  CARDIAC:  Regular rate and rhythm, S2 and S2, no S3, no murmurs.  ABDOMEN:  Benign.  EXTREMITIES:  No edema.   IMPRESSION:  1. Atrial arrhythmia seems to be resolved.  Again, will watch for      atrial fibrillation as he had said.  If it comes back, we will      start flecainide as well as Coumadin, but would not for now.      Again, she had documented rhythm issues and I do not get a sense      she is having any now.  2. Floaty feeling.  I am not sure what this represents.  I question      a little hypoglycemic.  Her fasting sugars were in the 50s.      Question coming off of hormone replacement.  Unclear.  I would      follow.   Before she does her morning chores, I encourage her to eat      a snack.  3. Health care maintenance.  Again, recent labs were done.  Total      cholesterol 232 but her HDL was 111, LDL was 108, triglycerides 66.      Again, follow.   I encouraged her to stay active.  Again, she is quite anxious.  Both  parents had strokes.  I think she is okay to stay on aspirin for now.  Will set follow-up for October.   ADDENDUM:  If she feels bad and her blood pressure is on the low side,  she could try stepping back and stopping her hydrochlorothiazide.     Pricilla Riffle, MD, [facility]  Electronically Signed    PVR/MedQ  DD: 07/03/2006  DT: 07/04/2006  Job #: [id number]

## 2010-06-05 NOTE — Assessment & Plan Note (Signed)
[facility] HEALTHCARE                       [place] CARDIOLOGY OFFICE NOTE   Amy [name], Amy [name]                       MRN:          [id number]  DATE:02/12/2007                            DOB:          Nov 24, 1935    IDENTIFICATION:  Ms. Love is a 75 year old woman whom I last saw back  in October 2008.  She has a history of atrial fibrillation as well as  atrial flutter (post flutter ablation).  She also has a history of  hypertension.   Since seen, I have been in touch with her actually on the phone.  We  increased her verapamil to 360 daily.  On telemonitor, she had a rapid  atrial fibrillation.  She remains on flecainide 50 b.i.d. (higher dose  led to widening of the QRS).  She says with the higher dose of  verapamil, she is feeling better, and on review of her telemetry she  actually was improved by the monitor.   She comes in today.  She is concerned about her Maxzide leading to sun  sensitivity.  She likes to sit out in the sun in the summer and wonders  if it can be switched.  Note she is doing better with her hair now that  she is off the beta blocker.  Review of her blood pressures from home  checked.  It has been anywhere from the 110s to 150 at the most.  Average looked like it is in the 120s-130s.  Her pulse has been good.   PHYSICAL EXAMINATION:  GENERAL:  On exam, the patient is currently in no  distress.  VITAL SIGNS:  Her blood pressure is 137/63, pulse 77, weight is 143.  LUNGS:  Clear.  CARDIAC:  Regular rate and rhythm, S1, S2.  No S3.  ABDOMEN:  Benign.  EXTREMITIES:  No edema.   IMPRESSION:  1. Atrial arrhythmias.  Continue on flecainide and Cartia at current      dosing.  She is clinically doing better.  2. Hypertension.  I have given her a prescription for lisinopril.  She      had been on it for a short time the past.  I am not sure what      happened.  There were some times when she was concerned about hair      loss, and  things were changed.  She asked about Tekturna today, but      on review of my notes she did not tolerate it when placed by her      primary physician, Dr. Charm Barges, for a short time.  Also, it could be      quite expensive.  Other options would be Diovan or Cozaar.  She      will call back.  She has got some medicine at home.  Stop the      Maxzide for now, and call back with her pressures. Otherwise,      tentative followup for 6 months.     Pricilla Riffle, MD, [facility]  Electronically Signed    PVR/MedQ  DD: 02/12/2007  DT: 02/13/2007  Job #:  [id number]   cc:   Dr. Charm Barges

## 2010-06-05 NOTE — Assessment & Plan Note (Signed)
[facility] HEALTHCARE                            CARDIOLOGY OFFICE NOTE   JACKELINE, GUTKNECHT                       MRN:          [id number]  DATE:04/01/2008                            DOB:          09/11/1935    IDENTIFICATION:  Ms. Nakajima is a 75 year old woman.  I last saw her back  in November.  She has a history of atrial flutter status post ablation;  atrial fibrillation on flecainide.  She also has a history of  hypertension.   Since seen she has not had any significant palpitations.  She is walking  a lot without a problem.   She does note spells where she will get flushed.  Her husband says her  face or ears flushed.  Her blood pressure will drop into the 160 range.  She denies any wheezing.  No GI complaints and the episode eases off on  its own.  Not associated with any particular activity any time of day.  No palpitations.   CURRENT MEDICINES:  Include  1. Aspirin 81.  2. Fosamax every week.  3. Multivitamin.  4. Calcium.  5. Prempro twice a week.  6. Lisinopril 10.  7. Dilacor 180.  8. Tylenol.  9. Flecainide 50 b.i.d.   PHYSICAL EXAMINATION:  GENERAL:  The patient is in no distress.  VITAL SIGNS:  Blood pressure 143/49.  Pulse is 70 and regular.  Weight  143 stable.  NECK:  JVP is normal.  LUNGS:  Clear.  No wheezes.  CARDIAC:  Regular rate and rhythm with occasional skip.  S1 and S2.  No  murmurs.  ABDOMEN:  Benign.  EXTREMITIES:  No edema.   Rhythm strip shows normal sinus rhythm with frequent PACs.   While examining, the patient noted some flushing indeed.  Indeed her  right ear became red.  I checked her blood pressure that was 140/70.   IMPRESSION:  1. Atrial arrhythmia, overall doing fairly well.  I would keep her on      the same regimen.  2. Hypertension, adequate control.  She does have some high spells,      little odd with flushing.  I am not convinced if there is any other      endocrine problems to explain this,  but I would have her talk to      Dr. Charm Barges regarding her hormone therapy.  May be this needs to be      increased some.  She has not switched to a generic.   Again I will defer any further workup with regards to flushing unless  she becomes more problematic or associated with other symptoms beyond.  I would defer again workup beyond, possibly increasing her hormone  therapy.   I will set to see her in 6 months, sooner if problems develops.     Pricilla Riffle, MD, [facility]  Electronically Signed    PVR/MedQ  DD: 04/01/2008  DT: 04/02/2008  Job #: [id number]   cc:   Samuel Jester

## 2010-06-08 ENCOUNTER — Other Ambulatory Visit: Payer: Self-pay | Admitting: *Deleted

## 2010-06-08 ENCOUNTER — Encounter (INDEPENDENT_AMBULATORY_CARE_PROVIDER_SITE_OTHER): Payer: Medicare Other

## 2010-06-08 DIAGNOSIS — R9431 Abnormal electrocardiogram [ECG] [EKG]: Secondary | ICD-10-CM

## 2010-06-08 MED ORDER — DILTIAZEM HCL ER 240 MG PO CP24: 240.0000 mg | ORAL_CAPSULE | Freq: Every day | ORAL | Status: DC

## 2010-06-08 NOTE — Assessment & Plan Note (Signed)
[facility] HEALTHCARE                       [place] CARDIOLOGY OFFICE NOTE   TENNA, LACKO                       MRN:          [id number]  DATE:02/27/2006                            DOB:          January 13, 1936    IDENTIFICATION:  Ms. Pucci did not have an appointment today.  She comes  in with her husband.  I follow her also in cardiology clinic.  She was  last seen in December and actually did not have a followup appointment  set.   She has a history of hypertension, also PACs, paroxysmal SVT.   When I saw her last, she was not tolerating Tekturna.  I put her on  lisinopril 10? 20.  I told her to start with 10, increase to 20.  It is  not clear which dose she is on.   She comes in today with a blood pressure chart.  Note also she had went  back to [place] but did not tolerate it and has gone back since to her  verapamil.  Takes a Flecainide 1/2 tablet b.i.d.  Has taken lisinopril  and, intermittently, her hydrochlorothiazide, which I had told her to  hold.  Her blood pressure has been variable.  There have been days where  she is running quite high up in to the 175 range.  She is taking  lisinopril for this and then she has had days where it is low.  After a  fluid pill, it is 89/50.  Again, her heartbeat at times is a bit  irregular, question if this is affecting the accuracy of her blood  pressure recordings.   Otherwise feeling well.   Current medications by understanding including:  1. Verapamil 90 daily.  2. Hydrochlorothiazide p.r.n. 25.  3. Flecainide 50 b.i.d.  4. Lisinopril ?10 versus 20.  5. Aspirin 81 mg daily.  6. Fosamax.  7. Prempro.  8. Multivitamin.  9. Calcium with D.   PHYSICAL EXAM:  The patient is in no distress today.  Blood pressure is 156 systolic.   1. What I have recommended is that she add hydrochlorothiazide 1/2      tablet daily to her regimen on a regular basis.  Continue on the      Verapamil 90.  Continue on  lisinopril 10 versus 20.  Continue on      flecainide.  Continue on aspirin.  All set to see her in 6 weeks'      time.  She should start taking her blood pressure in a couples'      week time.  2. Ectopy.  Continue on Flecainide.  I will review.  I do not have her      chart available at present,      with extensive ectopy.  I do not think she has had a Holter      monitor.  I know this must have been documented.  We will review.      Continue on current regimen for now.     Pricilla Riffle, MD, [facility]  Electronically Signed    PVR/MedQ  DD: 02/27/2006  DT:  02/27/2006  Job #: [id number]

## 2010-06-08 NOTE — Assessment & Plan Note (Signed)
[facility] HEALTHCARE                         ELECTROPHYSIOLOGY OFFICE NOTE   Amy [name]                       MRN:          [id number]  DATE:04/07/2006                            DOB:          1935/12/03    REFERRING PHYSICIAN:  Pricilla Riffle, MD, [facility]   Amy [name] is referred today by Dr. Dietrich Pates for evaluation of atrial  flutter.   HISTORY OF PRESENT ILLNESS:  The patient is a very pleasant 75 year old  woman who has a history of palpitations for many years.  She has had  documented episodes of atrial tach in the past and perhaps some atrial  flutter, although this is not very well characterized, and perhaps some  afib, although again looking at her strips her rhythm diagnosis is very  difficult to know for certain.  The patient, however, over the last  several weeks has had increasingly frequent episodes of tachy  palpitations and was found to have heart rate in the 130-140 b.p.m.  range.  She was seen in the office last week and was found to be in  atrial flutter with 2:1 AV conduction.  At that time, she was on  flecainide for her palpitations.  Of note, the patient has been tried on  beta-blockers in the past and had alopecia, by her report; she had been  on low-dose verapamil since then as well.  She is also on HCTZ.   ADDITIONAL PAST MEDICAL HISTORY:  Notable for:  1. A history of hypertension, which has been very well controlled in      the past.  2. She has a history of a right bundle branch block in the past.   FAMILY HISTORY:  Notable for both of her parents dying of strokes in  their 68's, the etiology of the stroke was unknown.   SOCIAL HISTORY:  1. The patient is married.  2. She denies tobacco or ethanol use.   Of note, the patient had a coronary CT scan done back in June of 2007  which demonstrated no calcium.   REVIEW OF SYSTEMS:  Is notable for palpitations.  She has very minimal  dyspnea with exertion.  She denies  chest pain, she denies syncope, she  denies much in the way of arthritic problems or GI problems, she denies  any neurologic problems.  The rest of her review of systems was  unremarkable.   PHYSICAL EXAM:  She is a pleasant, well-appearing woman in no distress.  The blood pressure today was 112/62, the pulse was 90 and irregular, the  respirations were 18, the weight was 140 pounds.  HEENT:  Normocephalic and atraumatic.  Pupils equal and round.  Oropharynx is moist.  Sclera are anicteric.  NECK:  No jugular venous distention, there was no thyromegaly, trachea  was midline, the carotids were 2+ and symmetric.  LUNGS:  Clear bilaterally to auscultation.  There are no wheezes, rales  or rhonchi.  There was no increased worker breathing.  CARDIOVASCULAR:  Irregular rate and rhythm with a normal S1 and S2.  I  do not appreciate any murmurs today.  The PMI was not enlarged nor was  it laterally displaced.  ABDOMINAL:  Soft, nontender, nondistended.  There was no organomegaly.  The bowel sounds were present, there was no rebound or guarding.  EXTREMITIES:  No clubbing, cyanosis or edema.  The pulses were 2+ and  symmetric.  NEUROLOGIC:  Alert and oriented x3 with cranial nerves intact.  The  strength was 5/5 and symmetric.   An EKG today demonstrates sinus rhythm with PACs.   IMPRESSION:  1. Symptomatic atrial flutter.  2. Questionable atrial fibrillation in the past.  3. Questionable atrial tachycardia.   DISCUSSION:  I have discussed the treatment options with the patient and  her husband in detail. I have recommended that we proceed with catheter  ablation of her atrial flutter as this appears to be her dominant  arrhythmia.  I would expect that she would require flecainide and I  would probably at least, in the short term, have her on Coumadin.  Today  I have written her a prescription for Coumadin 5 mg daily.  Following  her ablation, we would go through a period of watchful  waiting and if  her symptoms were controlled on flecainide and verapamil then we might  not make her go back to Coumadin.  On the other hand, if she ends up  having recurrences of afib despite flecainide then Coumadin,  particularly with her family history being so strong for dying of  strokes, would be worthwhile.     Amy [name]. Amy Ridgel, MD  Electronically Signed    GWT/MedQ  DD: 04/07/2006  DT: 04/08/2006  Job #: [id number]   cc:   Amy [name]

## 2010-06-08 NOTE — Op Note (Signed)
NAMEORETA, SOLOWAY                ACCOUNT NO.:  [id number]   MEDICAL RECORD NO.:  [id number]          PATIENT TYPE:  OIB   LOCATION:  4735                         FACILITY:  [facility]   PHYSICIAN:  [name]. Ladona Ridgel, MD    DATE OF BIRTH:  [birth year]   DATE OF PROCEDURE:  05/09/2006  DATE OF DISCHARGE:                               OPERATIVE REPORT   PROCEDURE PERFORMED:  Electrophysiologic study and RF catheter ablation  of atrial flutter.   INTRODUCTION:  The patient is a very pleasant 75 year old woman with a  history of atrial arrhythmias in the past.  She was found to have atrial  flutter and follow-up with a rapid ventricular response in the setting  of right bundle branch block.  She is now referred for  electrophysiologic study and catheter ablation.   PROCEDURE:  After informed consent was obtained, the patient was taken  to the diagnostic EP lab in the fasting state.  After the usual  preparation and draping, intravenous fentanyl and midazolam was given  for sedation.  A 6-French hexapolar catheter was inserted percutaneously  in the right jugular vein and advanced to the coronary sinus.  A 7-  [place] 20 pole halo catheter was inserted percutaneously in the right  femoral vein and advanced to right atrium.  A 5-French quadripolar  catheter was inserted percutaneously in the right femoral vein and  advanced to the His bundle region.  After measuring basic intervals,  rapid atrial pacing was carried out from the coronary sinus at pacing  cycle length of 600 milliseconds abdomen stepwise decreased down to 430  milliseconds where AV Wenckebach was observed.  During rapid atrial  pacing, the PR interval was less than the RR interval and there was no  inducible SVT.  Next, programmed atrial stimulation was carried out from  the coronary sinus and  the high right atrium at base drive cycle length  of 161 milliseconds.  The S1 S2 interval was stepwise decreased down to  440  milliseconds where the AV node ERP was observed.  During probing  stimulation, there were no AH jumps and no echo beats and no inducible  SVT.  Additional rapid atrial pacing was then carried out from the  coronary sinus at a pacing cycle length of 390 milliseconds and stepwise  decreased down to 230 milliseconds resulting in the initiation of atrial  flutter.  This was a right bundle branch block atrial flutter with cycle  length of 315 milliseconds.  Mapping was carried out demonstrating  typical counterclockwise tricuspid annular reentry around the right  atrium and earliest activation in the CS from proximal to distal.  With  confirmation of typical counterclockwise atrial flutter, a 7-French  quadripolar ablation catheter was maneuvered into the right atrium and  on in the usual atrial flutter isthmus between the tricuspid valve  annulus and the inferior vena cava.  Seven RF energy applications were  delivered in this area.  It should be noted that mapping demonstrated  that the atrial flutter isthmus was smaller than usual.  Her heart was  rightwardly rotated.  During the second RF energy application, atrial  flutter terminated after slowing and sinus rhythm was restored.  To  additional RF energy applications resulted in isthmus block.  Three  bonus RF energy applications were then delivered.  During this time, the  ablation catheter was maneuvered into the right ventricle and rapid  ventricular pacing was carried out from the right ventricle at 600  milliseconds which demonstrated VA dissociation.  Programmed ventricular  stimulation was also carried out from the right ventricle at 600  milliseconds also demonstrating VA dissociation.  At this point,  additional rapid atrial pacing was carried out from the coronary sinus,  resulting in the induction of nonsustained left atrial flutter at a  cycle length of 235 milliseconds.  This atrial flutter was atypical in  appearance and  terminated spontaneously.  With all of the above, the  patient was observed for 30 minutes and had no recurrent atrial flutter  isthmus conduction. The catheters removed.  Hemostasis was assured and  the patient was returned to his room in satisfactory condition.   COMPLICATIONS:  There were no immediate procedure complications.   RESULTS:  1. Baseline ECG:  The baseline ECG demonstrates sinus rhythm with      right bundle branch block.  2. Baseline intervals:  The sinus node cycle length was 770      milliseconds.  The HV interval was 60 milliseconds.  The QRS      duration was 154 milliseconds.  3. Rapid ventricular pacing:  Rapid atrial pacing from the RV apex      demonstrated VA dissociation at 600 milliseconds.  4. Programmed ventricular stimulation:  Programmed ventricular      stimulation was carried out from the RV apex at a base drive cycle      length of 600 milliseconds also demonstrating VA dissociation.  5. Rapid atrial pacing:  Rapid atrial pacing was carried out from the      coronary sinus and high right atrium, base drive cycle length of      600 milliseconds and stepwise decreased down to 430 milliseconds      demonstrating AV Wenckebach.  During rapid atrial pacing, the PR      interval was less than the RR interval and there was no inducible      SVT.  6. Programmed atrial stimulation:  Programmed atrial stimulation was      carried out from the coronary sinus and high right atrium, base      drive cycle length of 478 milliseconds.  The S1 S2 interval      stepwise decreased down to 440 milliseconds where the AV node ERP      was observed.  During programmed atrial stimulation there were no      AH jumps and no echo beats and no inducible SVT.  7. Arrhythmias observed:      a.     Atrial flutter:  Initiation of rapid atrial pacing duration       was sustained, termination was with catheter ablation cycle length       350 milliseconds.     b.     Atypical  flutter:  Initiation of rapid atrial pacing       duration was sustained cycle length 235 milliseconds, method of       termination was spontaneous.  Of note, this was nonsustained.  8. Mapping:  Mapping of the atrial flutter isthmus demonstrated a      smaller than usual  atrial flutter isthmus.  9. RF energy application:  A total of seven RF energy applications      were delivered.  During the second RF energy application, atrial      flutter was terminated with RF catheter ablation.  Isthmus      conduction was present.  Two additional RF energy applications were      delivered resulting in isthmus block.  Three bonus RF energy      applications were then delivered and the patient was observed 30      minutes with no atrial flutter isthmus conduction.   CONCLUSION:  This study demonstrates successful electrophysiologic study  and radiofrequency catheter ablation of typical atrial flutter with  total seven radiofrequency energy applications delivered at the usual  atrial flutter isthmus.  It also demonstrates inducible nonsustained  left atrial flutter of uncertain clinical significance.      [name]. Ladona Ridgel, MD  Electronically Signed     GWT/MEDQ  D:  05/09/2006  T:  05/09/2006  Job:  [id number]   cc:   Pricilla Riffle, MD, [facility]  Samuel Jester

## 2010-06-08 NOTE — Assessment & Plan Note (Signed)
[facility]                       [facility] CARDIOLOGY OFFICE NOTE   ALAIJAH, GIBLER                       MRN:          [id number]  DATE:12/27/2005                            DOB:          1935-10-18    IDENTIFICATION:  Ms. Sherman is a 75 year old woman who I followed in  Cardiology Clinic.  She has a history of palpitations.  She was last  seen actually on November 13 by Jacolyn Reedy for blood pressure  elevation.   HISTORY OF PRESENT ILLNESS:  The patient has a longstanding history of  palpitations with PACs, paroxysmal SVT.  She is on flecainide therapy as  well as verapamil (note with beta blockers she had excessive hair loss).   Her blood pressure back on Halloween was elevated at 207.  She called  her primary MD and was placed on Tekturna.  Blood pressure dropped some,  into the 120s to 150s over several weeks.  There were still peaks in the  170s.   Note the patient since has gone down on November 18 to a half of  verapamil, half of flecainide, concerned again about excess hair loss as  well as constipation.  Her constipation has improved but the hair loss  continues.  Blood pressures since this change were in the 140s to 160s,  even 170s.  She was seen on November 13 and diuretic,  hydrochlorothiazide 25, was started, and blood pressure has responded  nicely into the 110s to 140s mainly, with occasional peak at 185.   The patient notes no increased palpitations, the constipation is a  little better.   CURRENT MEDICATIONS:  1. Verapamil 90.  2. Hydrochlorothiazide 25.  3. Flecainide 50 b.i.d.  4. Tekturna 150 daily.  5. Aspirin 81 daily.  6. Fosamax q. week.  7. Prempro.  8. Multivitamin, Centrum.  9. Calcium with D b.i.d.   PHYSICAL EXAMINATION:  The patient is in no distress.  Blood pressure is 130/70, pulse is 80 and regular, weight 140.  LUNGS:  Clear.  NECK:  JVP is normal, no thyromegaly.  CARDIAC:  Regular rate and  rhythm, S1, S2, no S3, S4 or murmurs.  ABDOMEN:  Benign.  EXTREMITIES:  No edema.   IMPRESSION:  1. Hypertension, which I have simplified her medicine and from a cost      perspective as well I have given her a prescription for lisinopril      20 instead of the Tekturna, start at 10 and increase to 20, at some      point may be able to make a combination of hydrochlorothiazide with      the lisinopril if she tolerates it.  Again, side effects with the      hair loss low, less than 1% with lisinopril.  2. Palpitations.  Since she has not noticed a change, stay on      flecainide b.i.d. 50, continue on half of verapamil.  I will need      to review her records, her main chart is not here today.  She has      tried Cardizem in the  past, it might give her a little less      constipation and also may be a little less hair loss.  Again, side      effect profile for verapamil is less than 1% with alopecia.  I am      not sure how to explain all this.  I am not convinced the 2 drugs      are acting in synergy to promote hair loss.   The patient does note her father had no hair, does not know of other  family members though on his side, as they have all died.   I will be in touch with the patient regarding followup.     Pricilla Riffle, MD, [facility]  Electronically Signed    PVR/MedQ  DD: 12/27/2005  DT: 12/28/2005  Job #: [id number]   cc:   Samuel Jester

## 2010-06-08 NOTE — Assessment & Plan Note (Signed)
[facility]                              CARDIOLOGY OFFICE NOTE   Amy [name], Amy [name]                       MRN:          [id number]  DATE:12/03/2005                            DOB:          December 29, 1935    The patient is seen as an add-on in the [facility] clinic on December 03, 2005, for hypertension.  This is a very pleasant 75 year old married white  female patient of Dr. Dietrich Pates who is followed for palpitations with PACs  and paroxysmal supraventricular tachycardia who is currently being treated  with flecainide.  Recently, her blood pressure has gone up and she was  started on Tekturna by her family doctor.  She was here today because her  blood pressure is still up and she wanted to make sure it is not interacting  with her flecainide.   The patient also is having difficulty with constipation and hair loss.  She  said she had hair loss on Toprol-XL and this got better when it was stopped  and she was switched to verapamil, but now she is having it on verapamil.  She has a Medical sales representative with side effects that include hair loss with  verapamil with her, and has also printed out side effects with flecainide,  which she is worried about the interactive with verapamil.   CURRENT MEDICATIONS:  1. Prempro 0.3/1.5 every third or fourth day.  2. Fosamax 70 mg weekly.  3. Calcium 600 two daily.  4. Multivitamin daily.  5. Allegra 180 daily.  6. Verapamil 180 mg daily.  7. Aspirin 81 mg daily.  8. Tekturna 150 mg daily.  9. Flecainide 100 mg b.i.d.   PHYSICAL EXAMINATION:  GENERAL:  This is a very pleasant 75 year old white  female in no acute distress.  VITAL SIGNS:  Blood pressure 150/72, pulse 88, weight 142.  NECK:  Without JVD, HJR, bruit or thyroid enlargement.  LUNGS:  Clear anterior, posterior and lateral.  HEART:  Regular rate and rhythm at 80 beats per minute.  Normal S1 and S2  with a 1/6 systolic ejection murmur at the left  sternal border.  ABDOMEN:  Soft without organomegaly, masses, lesions or abnormal tenderness.  EXTREMITIES:  Without cyanosis, clubbing or edema.  She has good distal  pulses.   EKG:  Normal sinus rhythm with right bundle-branch block, no acute change.   IMPRESSION:  1. Hypertension, uncontrolled.  2. Palpitations with premature atrial contractions and paroxysmal      supraventricular tachycardia being treated with flecainide and      verapamil.  3. Chronic right bundle-branch block.  4. Osteoporosis.  5. Normal coronary arteries on catheterization in 2003 with recent CT of      her coronaries after an abnormal Cardiolite showing calcium score of 0,      ejection fraction of 46%.  6. Hair loss, question secondary to verapamil.   PLAN AT THIS TIME:  I have asked the patient to take hydrochlorothiazide 25  mg in addition to her other medications to help better control her blood  pressure.  I will schedule  her a followup with Dr. Tenny Craw to talk about  possibly stopping the verapamil because of the constipation and hair loss.  She will see her back within a month and I have asked her to follow up with  her medical doctor for further blood pressure management.      Jacolyn Reedy, PA-C  Electronically Signed      Cecil Cranker, MD, [facility]  Electronically Signed   ML/MedQ  DD: 12/03/2005  DT: 12/03/2005  Job #: [id number]   cc:   Samuel Jester

## 2010-06-08 NOTE — Op Note (Signed)
NAMEKAROLYN, MESSING                ACCOUNT NO.:  [id number]   MEDICAL RECORD NO.:  [id number]          PATIENT TYPE:  AMB   LOCATION:  DSC                          FACILITY:  [facility]   PHYSICIAN:  Loreta Ave, M.D. DATE OF BIRTH:  [birth year]   DATE OF PROCEDURE:  01/30/2006  DATE OF DISCHARGE:                               OPERATIVE REPORT   PREOPERATIVE DIAGNOSIS:  Right knee medial and lateral meniscus tear,  chondromalacia patella.   POSTOPERATIVE DIAGNOSIS:  Right knee medial and lateral meniscus tear,  chondromalacia patella.  Some focal grade 2 and 3 chondromalacia  weightbearing down lateral femoral condyle and condylar loose bodies.   OPERATIVE PROCEDURE:  Right knee exam under anesthesia, arthroscopy,  relatively extensive lateral meniscectomy, and posterior horn medial  meniscectomy, chondroplasty patella with removal of condylar loose  bodies.   SURGEON:  Loreta Ave, M.D.   ASSISTANT:  Genene Churn. Denton Meek.   ANESTHESIA:  General.   BLOOD LOSS:  Minimal.   TOURNIQUET:  Not employed.   SPECIMENS:  None.   CULTURES:  None.   COMPLICATIONS:  None.   DRESSING:  Soft compressive.   PROCEDURE:  The patient brought to the operating room, placed on the  operative table in the supine position.  After adequate anesthesia had  been obtained, the right knee examined.  Chondromalacia of patella with  crepitus.  Good motion and stability.  Tourniquet leg holder applied.  The leg prepped and draped in the usual sterile fashion.  Three portals  created.  One superolateral and one in each medial and lateral  parapatella.  Inflow catheter introduced.  Standard arthroscope  introduced, knee inspected.  Grade 2 and 3 chondromalacia of patella,  peaking at the patella and relatively diffuse.  Chondroplasty to a  stable surface.  Tracking good.  Trochlea looked good.  Cruciate  ligament was intact.  The medial compartment not much in the way of  degenerative change,  but complex tearing of posterior half of the  meniscus.  Taken out to the rim, tapering into __________ meniscus,  salvaging the anterior half.  Laterally marked complex tearing of the  entire lateral meniscus.  Entire anterior half removed and about half of  the posterior half before I got back to healthy tissue.  Hemostasis with  cautery.  A few condylar loose bodies removed.  One focal 1.5 cm area of  grade 3 change posterior aspect lateral femoral condyle, but other than  that that  compartment looked good.  Entire knee examined.  No other findings  appreciated.  Instruments and fluid removed.  Portals of the knee  injected with Marcaine.  Portals closed with 4-0 nylon.  Sterile  compression dressing applied.  Anesthesia reversed.  Brought to recovery  room.  Tolerated surgery well.  No complications.      Loreta Ave, M.D.  Electronically Signed     DFM/MEDQ  D:  01/30/2006  T:  01/30/2006  Job:  [id number]

## 2010-06-08 NOTE — Cardiovascular Report (Signed)
[place]  Patient:    Amy [name], Amy [name] Visit Number: [id number] MRN: [id number]          Service Type: CAT Location: [facility] 2867 01 Attending Physician:  Ronaldo Miyamoto Dictated by:   Arturo Morton Riley Kill, M.D. [facility] Proc. Date: 05/15/01 Admit Date:  05/15/2001 Discharge Date: 05/15/2001   CC:         Jonelle Sidle, M.D. [facility]  Samuel Jester, M.D.  CV Laboratory   Cardiac Catheterization  INDICATIONS: The patient is a delightful 75 year old, who has had an episode of chest pain. Her baseline ECG showed right bundle-branch block with rightward oriented axis. She had an exercise tolerance test which demonstrated exercise-induced ST depression and because of this she was referred for coronary arteriography by Dr. Diona Browner. Her Cardiolite also suggested inferobasal ischemia. The echocardiogram which was read by Dr. Andee Lineman revealed left ventricular hypertrophy. The right-sided chambers were not enlarged. There was some thickening of both the aortic and mitral valves, but no significant stenosis.  PROCEDURES: 1. Left heart catheterization. 2. Selective coronary arteriography. 3. Selective left ventriculography. 4. Proximal root aortography.  DESCRIPTION OF PROCEDURE: The procedure was performed from the right femoral artery using 6 French catheters.  She tolerated the procedure well without complication. She was taken to the holding area in satisfactory clinical condition.  HEMODYNAMICS: 1. Central aorta 160/72. 2. Left ventricular 144/11. 3. No aortic left ventricular gradient on pullback.  ANGIOGRAPHIC DATA: 1. Ventriculography was performed in the RAO projection. Overall, systolic    function was well preserved and no segmental abnormalities contraction    were identified. 2. The aortic root shot revealed a normal sized aorta with no evidence of    aortic dissection. We actually performed this because of ostial    spasm in  the right coronary artery and this revealed that the right    ostium was widely patent after we administered aortic root nitroglycerin. 3. The left main coronary artery appeared to be free of critical disease. 4. The left anterior descending artery coursed to the apex.  The LAD had    one small to moderate sized diagonal branch and then terminated with the    bifurcation at the apex. The LAD appeared smooth and free of critical    disease. 5. The circumflex proper provided a small first marginal, a very large second    marginal and then terminated as a vessel with four smaller posterolateral    branches and possibly a codominant system. It was free of critical disease. 6. The right coronary artery demonstrates a little bit of ostial spasm. The    distal right is very small in caliber. There is a fairly acute marginal    branch of which appears to be free of critical disease.  CONCLUSIONS: 1. Normal left ventricular function. 2. No evidence of aortic dissection. 3. Widely patent coronary arteries without luminal irregularities.  DISPOSITION: The patient will be seen and followed by Dr. Diona Browner. Dictated by:   Arturo Morton Riley Kill, M.D. [facility] Attending Physician:  Ronaldo Miyamoto DD:  05/15/01 TD:  05/16/01 Job: [id number]

## 2010-06-08 NOTE — Assessment & Plan Note (Signed)
[facility]                       [place] CARDIOLOGY OFFICE NOTE   AKASHA, MELENA                       MRN:          [id number]  DATE:04/04/2006                            DOB:          03-Jan-1936    IDENTIFICATION:  Ms. Trotti is a woman who I have followed in clinic.  She has a history of atrial tachycardia.  She was last seen in  cardiology clinic in February.  At the time I recommended adding back  hydrochlorothiazide for better blood pressure control; continue on  verapamil, lisinopril; continue flecainide and aspirin.   Note since seen the patient has continued palpitations.  She actually  took an extra flecainide.  She comes in today for further evaluation.  Denies dizziness.   CURRENT MEDICATIONS:  1. Flecainide 50 b.i.d.  2. Aspirin 81 mg daily.  3. Fosamax weekly.  4. Multivitamin.  5. Calcium with D b.i.d.  6. Verapamil one-half tablet of 40 daily.  7. Lisinopril 10 daily.  8. Hydrochlorothiazide 12.5 daily.   PHYSICAL EXAMINATION:  GENERAL:  The patient is in no distress.  VITAL SIGNS:  Blood pressure is 110/68, pulse is 130 and regular, weight  135, down from 140.  LUNGS:  Clear.  CARDIAC:  Regular rate and rhythm, tachycardic.  S1, S2.  No S3, no  murmurs.  ABDOMEN:  Benign.  EXTREMITIES:  No edema.   A 12-lead EKG shows a wide-complex tachycardia, right bundle branch  morphology that is similar to her previous, presumed therefore SVT,  looks like atrial flutter with flutter waves in leads III and F.  With  carotid sinus massage I was unable to slow the rhythm.  I did not try  adenosine.   I have discussed with [name].  He will see the patient on Monday.  I told her for now to stop the flecainide, continue on verapamil.  She  has been very sensitive as far as her medicines.  I told her, though, to  take an additional whole verapamil, up to two verapamil per day, to slow  her rate down.  She will see Dr.  Ladona Ridgel on Monday.  For now, continue on  aspirin though she may need Coumadin but would hold for now.     Pricilla Riffle, MD, [facility]  Electronically Signed   PVR/MedQ  DD: 04/07/2006  DT: 04/07/2006  Job #: [id number]

## 2010-06-08 NOTE — Discharge Summary (Signed)
Amy [name], Amy [name]                ACCOUNT NO.:  [id number]   MEDICAL RECORD NO.:  [id number]          PATIENT TYPE:  OIB   LOCATION:  4735                         FACILITY:  [facility]   PHYSICIAN:  [name]. Ladona Ridgel, MD    DATE OF BIRTH:  November 25, 1935   DATE OF ADMISSION:  05/09/2006  DATE OF DISCHARGE:  05/10/2006                               DISCHARGE SUMMARY   PRIMARY CARDIOLOGIST:  Dr. Huston Foley   ELECTROPHYSIOLOGIST:  Dr. Ladona Ridgel   PRIMARY CARE PHYSICIAN:  Dr. Samuel Jester   PRINCIPAL DIAGNOSIS:  Atrial flutter, symptomatic.   SECONDARY DIAGNOSES:  1. Questionable history of atrial fibrillation in the past.  2. Questionable atrial tachycardia.  3. Hypertension.  4. Palpitations.  5. Nonobstructive coronary artery disease by cath in August of 2003.   ALLERGIES:  BETA BLOCKERS CAUSE ALOPECIA.   PROCEDURE:  Atrial flutter radiofrequency ablation.   HISTORY OF PRESENT ILLNESS:  A 75 year old Caucasian female with prior  history of palpitations and documented atrial tachycardia and atrial  flutter treated with verapamil and flecainide in the past who recently  saw Dr. Gilman Schmidt in clinic on April 07, 2006 at which point  decision was made to pursue radiofrequency ablation of atrial flutter.   HOSPITAL COURSE:  Patient presented to the electrophysiology lab on  May 09, 2006 and underwent successful atrial flutter radiofrequency  ablation.  The patient tolerated the procedure well and has had no  additional atrial flutter.  She has been ambulating without difficulty  and will be discharged home today in satisfactory condition.   DISCHARGE LABS:  Hemoglobin 12.7, hematocrit 37.2, WBC 7.6, platelets  259.  PT 22.2, INR 1.9.  Sodium 138, potassium 3.4, chloride 100, CO2  31, BUN 17, creatinine 1.16, glucose 96, calcium 9.9.   DISPOSITION:  The patient is being discharged home today in good  condition.   FOLLOWUP PLANS AND APPOINTMENTS:  She has a followup appointment  with  Dr. Gilman Schmidt on May 23 at 10:15 a.m.   DISCHARGE MEDICATIONS:  1. Verapamil 100 80 mg 1 1/2 tablets q.d.  2. Enteric-coated aspirin 81 mg q.d.  3. Fosamax 70 mg q.week.  4. HCTZ 12.5 mg q.d.  5. Flecainide 50 mg t.i.d.   OUTSTANDING LABS AND STUDIES:  None.   Duration of discharge encounter 32 minutes including physician time.      Nicolasa Ducking, ANP      [name]. Ladona Ridgel, MD  Electronically Signed    CB/MEDQ  D:  05/10/2006  T:  05/10/2006  Job:  [id number]   cc:   Samuel Jester

## 2010-06-11 ENCOUNTER — Ambulatory Visit (INDEPENDENT_AMBULATORY_CARE_PROVIDER_SITE_OTHER): Payer: Medicare Other | Admitting: *Deleted

## 2010-06-11 DIAGNOSIS — I4891 Unspecified atrial fibrillation: Secondary | ICD-10-CM

## 2010-06-11 LAB — POCT INR: INR: 2.4

## 2010-06-11 MED ORDER — ATENOLOL 25 MG PO TABS: 25.0000 mg | ORAL_TABLET | Freq: Every day | ORAL | Status: DC

## 2010-06-11 NOTE — Patient Instructions (Signed)
Per pt saw Dr Tenny Craw in office for EKG at last week, she advised she was going to add another medication to pt's current regimen, but pt checked at [facility] and no rx had been called in.  Called Dr Tenny Craw and she wishes for pt to start Atenolol 25mg  qd. Rx sent to pharmacy pt aware, done electronically while pt was in office.

## 2010-06-19 ENCOUNTER — Ambulatory Visit (INDEPENDENT_AMBULATORY_CARE_PROVIDER_SITE_OTHER): Payer: Medicare Other | Admitting: *Deleted

## 2010-06-19 ENCOUNTER — Telehealth: Payer: Self-pay | Admitting: Internal Medicine

## 2010-06-19 DIAGNOSIS — I4891 Unspecified atrial fibrillation: Secondary | ICD-10-CM

## 2010-06-19 NOTE — Telephone Encounter (Signed)
Pt needs flecainide to be call in [phone] pt needs 90 days supply

## 2010-06-20 ENCOUNTER — Other Ambulatory Visit: Payer: Self-pay | Admitting: *Deleted

## 2010-06-20 NOTE — Telephone Encounter (Signed)
Pt flecainide Rx called into pharmacy for 90 day supply with 3 additional refills.  Judithe Modest, CMA

## 2010-06-28 ENCOUNTER — Other Ambulatory Visit: Payer: Self-pay

## 2010-06-28 DIAGNOSIS — I4891 Unspecified atrial fibrillation: Secondary | ICD-10-CM

## 2010-06-28 MED ORDER — WARFARIN SODIUM 5 MG PO TABS: ORAL_TABLET | ORAL | Status: DC

## 2010-07-03 ENCOUNTER — Ambulatory Visit (INDEPENDENT_AMBULATORY_CARE_PROVIDER_SITE_OTHER): Payer: Medicare Other | Admitting: *Deleted

## 2010-07-03 ENCOUNTER — Encounter: Payer: Self-pay | Admitting: Physician Assistant

## 2010-07-03 ENCOUNTER — Ambulatory Visit (INDEPENDENT_AMBULATORY_CARE_PROVIDER_SITE_OTHER): Payer: Medicare Other | Admitting: Physician Assistant

## 2010-07-03 VITALS — BP 146/65 | HR 95 | Resp 14 | Ht 68.0 in | Wt 140.0 lb

## 2010-07-03 DIAGNOSIS — I4891 Unspecified atrial fibrillation: Secondary | ICD-10-CM

## 2010-07-03 DIAGNOSIS — I491 Atrial premature depolarization: Secondary | ICD-10-CM

## 2010-07-03 MED ORDER — ATENOLOL 25 MG PO TABS: 25.0000 mg | ORAL_TABLET | Freq: Two times a day (BID) | ORAL | Status: DC

## 2010-07-03 NOTE — Patient Instructions (Signed)
Your physician recommends that you schedule a follow-up appointment in: 4 weeks with Dr Tenny Craw  Your physician has recommended you make the following change in your medication: Continue with the Flecainide at 50mg  twice daily and increase Atenolol to 25mg  twice daily

## 2010-07-03 NOTE — Assessment & Plan Note (Addendum)
As noted above, we had a long discussion regarding heart rate variability and the possibility that PACs may be causing difficulty in detecting her heart rate on her blood pressure cuff.  She was uncomfortable taking Flecainide 75 mg twice a day.  She reduced it back to 50 mg BID on her own.  She can continue this.  Her heart rate is elevated.  She does have lots of PACs.  I have recommended that she increase her atenolol to 25 mg twice a day.  She can continue to monitor blood pressure and heart rate at home and followup with Dr. Tenny Craw in 4 weeks.

## 2010-07-03 NOTE — Progress Notes (Signed)
History of Present Illness: Primary Cardiologist:  Dr. Dietrich Pates  Amy [name] is a 75 y.o. female With a history of atrial fibrillation and atrial flutter and hypertension.  She has a history of radio frequency catheter ablation.  She is on flecainide therapy.  She had normal coronaries by catheterization in 2003.  She had a cardiac CT in 2007 that demonstrated normal coronary arteries.  She saw Dr. Tenny Craw in April and had a Holter monitor.  This apparently demonstrated atrial fibrillation.  She was placed on Coumadin.  She was also asked to increase her flecainide to 75 mg twice a day.  She has noted increased heart rate variability on her blood pressure cuff at home.  She was concerned about this and cut her foot on a back to 50 mg twice a day.  She had multiple questions and set up an appointment today.  We talked about how low blood pressure should be.  We also talked about her heart rate variability.  She has multiple PACs on her EKG today.  I explained to her that this may be causing abnormal readings with her blood pressure cuff.  She denies chest pain, shortness of breath, syncope.  She denies orthopnea, PND or edema.  She denies palpitations.  Past Medical History  Diagnosis Date  . Atrial flutter     s/p ablation  . Palpitations   . HTN (hypertension)   . Atrial fibrillation     Current Outpatient Prescriptions  Medication Sig Dispense Refill  . alendronate (FOSAMAX) 70 MG tablet Take 70 mg by mouth every 7 (seven) days. Take with a full glass of water on an empty stomach.       Marland Kitchen aspirin 81 MG tablet Take 81 mg by mouth daily.        Marland Kitchen atenolol (TENORMIN) 25 MG tablet Take 1 tablet (25 mg total) by mouth daily.  30 tablet  11  . Calcium Carbonate-Vitamin D (CALCIUM + D PO) 1 tab po qd       . diltiazem (DILACOR XR) 240 MG 24 hr capsule Take 1 capsule (240 mg total) by mouth daily.  30 capsule  11  . estrogen, conjugated,-medroxyprogesterone (PREMPRO) 0.3-1.5 MG per tablet Take 1  tablet by mouth 2 (two) times a week.       . flecainide (TAMBOCOR) 50 MG tablet Take 50 mg by mouth 2 (two) times daily.       Marland Kitchen levothyroxine (SYNTHROID) 50 MCG tablet Take 50 mcg by mouth daily.        . meloxicam (MOBIC) 15 MG tablet Take 15 mg by mouth as needed.        . Multiple Vitamin (MULTIVITAMIN PO) Take by mouth. Multivitamin for bones       . Multiple Vitamin (MULTIVITAMIN PO) Take by mouth daily.        Marland Kitchen warfarin (COUMADIN) 5 MG tablet Take as directed   30 tablet  0  . DISCONTD: Calcium Carbonate POWD 1 tablet by Does not apply route daily.          Allergies: No Known Allergies  Vital Signs: BP 146/65  Pulse 95  Resp 14  Ht 5\' 8"  (1.727 m)  Wt 140 lb (63.504 kg)  BMI 21.29 kg/m2  PHYSICAL EXAM: Well nourished, well developed, in no acute distress HEENT: normal Neck: no JVD Cardiac:  normal S1, S2; Regularly irregular; no murmur Lungs:  clear to auscultation bilaterally, no wheezing, rhonchi or rales Abd: soft, nontender Ext:  no edema Skin: warm and dry Neuro:  CNs 2-12 intact, no focal abnormalities noted  EKG:  Sinus rhythm, heart rate 95, frequent PACs, nonspecific ST-T wave changes, right bundle branch block, no significant change when compared to prior tracing  ASSESSMENT AND PLAN:

## 2010-07-31 ENCOUNTER — Encounter: Payer: Medicare Other | Admitting: *Deleted

## 2010-07-31 ENCOUNTER — Ambulatory Visit (INDEPENDENT_AMBULATORY_CARE_PROVIDER_SITE_OTHER): Payer: Medicare Other | Admitting: Internal Medicine

## 2010-07-31 DIAGNOSIS — I4891 Unspecified atrial fibrillation: Secondary | ICD-10-CM

## 2010-07-31 MED ORDER — WARFARIN SODIUM 5 MG PO TABS: ORAL_TABLET | ORAL | Status: DC

## 2010-08-03 ENCOUNTER — Encounter: Payer: Medicare Other | Admitting: *Deleted

## 2010-08-03 ENCOUNTER — Ambulatory Visit: Payer: Medicare Other | Admitting: Internal Medicine

## 2010-08-28 ENCOUNTER — Ambulatory Visit (INDEPENDENT_AMBULATORY_CARE_PROVIDER_SITE_OTHER): Payer: Medicare Other | Admitting: *Deleted

## 2010-08-28 ENCOUNTER — Encounter: Payer: Medicare Other | Admitting: *Deleted

## 2010-08-28 DIAGNOSIS — I4891 Unspecified atrial fibrillation: Secondary | ICD-10-CM

## 2010-08-28 LAB — POCT INR: INR: 2.2

## 2010-09-26 ENCOUNTER — Encounter: Payer: Medicare Other | Admitting: *Deleted

## 2010-09-26 ENCOUNTER — Ambulatory Visit (INDEPENDENT_AMBULATORY_CARE_PROVIDER_SITE_OTHER): Payer: Medicare Other | Admitting: *Deleted

## 2010-09-26 DIAGNOSIS — I4891 Unspecified atrial fibrillation: Secondary | ICD-10-CM

## 2010-09-26 LAB — POCT INR: INR: 2.1

## 2010-10-02 ENCOUNTER — Other Ambulatory Visit: Payer: Self-pay | Admitting: Cardiology

## 2010-10-02 DIAGNOSIS — I4891 Unspecified atrial fibrillation: Secondary | ICD-10-CM

## 2010-10-02 MED ORDER — WARFARIN SODIUM 5 MG PO TABS: ORAL_TABLET | ORAL | Status: DC

## 2010-10-02 NOTE — Telephone Encounter (Signed)
Med refill

## 2010-10-18 ENCOUNTER — Ambulatory Visit (INDEPENDENT_AMBULATORY_CARE_PROVIDER_SITE_OTHER): Payer: Medicare Other | Admitting: *Deleted

## 2010-10-18 DIAGNOSIS — I4891 Unspecified atrial fibrillation: Secondary | ICD-10-CM

## 2010-11-15 ENCOUNTER — Ambulatory Visit (INDEPENDENT_AMBULATORY_CARE_PROVIDER_SITE_OTHER): Payer: Medicare Other | Admitting: *Deleted

## 2010-11-15 DIAGNOSIS — I4891 Unspecified atrial fibrillation: Secondary | ICD-10-CM

## 2010-11-26 ENCOUNTER — Other Ambulatory Visit: Payer: Self-pay | Admitting: *Deleted

## 2010-11-26 MED ORDER — FLECAINIDE ACETATE 50 MG PO TABS: 50.0000 mg | ORAL_TABLET | Freq: Two times a day (BID) | ORAL | Status: DC

## 2010-12-28 ENCOUNTER — Ambulatory Visit (INDEPENDENT_AMBULATORY_CARE_PROVIDER_SITE_OTHER): Payer: Medicare Other | Admitting: *Deleted

## 2010-12-28 ENCOUNTER — Encounter: Payer: Self-pay | Admitting: Internal Medicine

## 2010-12-28 ENCOUNTER — Ambulatory Visit (INDEPENDENT_AMBULATORY_CARE_PROVIDER_SITE_OTHER): Payer: Medicare Other | Admitting: Internal Medicine

## 2010-12-28 VITALS — BP 148/68 | HR 83 | Ht 68.0 in | Wt 138.0 lb

## 2010-12-28 DIAGNOSIS — I4891 Unspecified atrial fibrillation: Secondary | ICD-10-CM

## 2010-12-28 DIAGNOSIS — Z8679 Personal history of other diseases of the circulatory system: Secondary | ICD-10-CM

## 2010-12-28 DIAGNOSIS — I10 Essential (primary) hypertension: Secondary | ICD-10-CM

## 2010-12-28 LAB — CBC WITH DIFFERENTIAL/PLATELET
Basophils Absolute: 0 10*3/uL (ref 0.0–0.1)
Eosinophils Relative: 0 % (ref 0.0–5.0)
HCT: 35.7 % — ABNORMAL LOW (ref 36.0–46.0)
Hemoglobin: 12 g/dL (ref 12.0–15.0)
Lymphs Abs: 1.4 10*3/uL (ref 0.7–4.0)
MCV: 92.8 fl (ref 78.0–100.0)
Monocytes Absolute: 0.8 10*3/uL (ref 0.1–1.0)
Monocytes Relative: 9.3 % (ref 3.0–12.0)
Neutro Abs: 6.1 10*3/uL (ref 1.4–7.7)
Platelets: 339 10*3/uL (ref 150.0–400.0)
RDW: 13.1 % (ref 11.5–14.6)

## 2010-12-28 LAB — BASIC METABOLIC PANEL
BUN: 15 mg/dL (ref 6–23)
CO2: 26 mEq/L (ref 19–32)
Chloride: 104 mEq/L (ref 96–112)
GFR: 43.79 mL/min — ABNORMAL LOW (ref >60.00)
Glucose, Bld: 90 mg/dL (ref 70–99)
Potassium: 4.1 mEq/L (ref 3.5–5.1)
Sodium: 140 mEq/L (ref 135–145)

## 2010-12-28 NOTE — Progress Notes (Signed)
HPI  Patient is a 75 year old with a history of atrial flutter (s/p ablation), afib.  She has been on flecanide and is now on coumadin She was seen by s Alben Spittle in June  On 75 bid of flecanide she noticed increased HR variablitiy.  She cut back to 50 bid.  He went ahead and increased her b blocker.  She could not tolerate and stopped it. Since seen she has done ok.  No signif palpitations.  BP fairly well contolled.  No CP.  No Known Allergies  Current Outpatient Prescriptions  Medication Sig Dispense Refill  . alendronate (FOSAMAX) 70 MG tablet Take 70 mg by mouth every 7 (seven) days. Take with a full glass of water on an empty stomach.       Marland Kitchen aspirin 81 MG tablet Take 81 mg by mouth daily.        . Calcium Carbonate-Vitamin D (CALCIUM + D PO) 1 tab po qd       . diltiazem (DILACOR XR) 240 MG 24 hr capsule Take 1 capsule (240 mg total) by mouth daily.  30 capsule  11  . estrogen, conjugated,-medroxyprogesterone (PREMPRO) 0.3-1.5 MG per tablet Take 1 tablet by mouth 2 (two) times a week.       . flecainide (TAMBOCOR) 50 MG tablet Take 1 tablet (50 mg total) by mouth 2 (two) times daily.  60 tablet  2  . levothyroxine (SYNTHROID) 50 MCG tablet Take 50 mcg by mouth daily.        . meloxicam (MOBIC) 15 MG tablet Take 15 mg by mouth as needed.        . Multiple Vitamin (MULTIVITAMIN PO) Take by mouth. Multivitamin for bones       . warfarin (COUMADIN) 5 MG tablet Take as directed by Anticoagulation clinic    35 tablet  3    Past Medical History  Diagnosis Date  . Atrial flutter     s/p ablation  . Palpitations   . HTN (hypertension)   . Atrial fibrillation     Past Surgical History  Procedure Date  . A flutter ablation   . Knee arthroscopy     Family History  Problem Relation Age of Onset  . Stroke Father 65    died from stroke at age 5  . Stroke Mother 69    died from stroke at age 58    History   Social History  . Marital Status: Married    Spouse Name: N/A   Number of Children: 1  . Years of Education: N/A   Occupational History  . Retired    Social History Main Topics  . Smoking status: Never Smoker   . Smokeless tobacco: Not on file  . Alcohol Use: Not on file  . Drug Use: Not on file  . Sexually Active: Not on file   Other Topics Concern  . Not on file   Social History Narrative  . No narrative on file    Review of Systems:  All systems reviewed.  They are negative to the above problem except as previously stated.  Vital Signs: BP 148/68  Pulse 83  Ht 5\' 8"  (1.727 m)  Wt 138 lb (62.596 kg)  BMI 20.98 kg/m2  Physical Exam  Patient is in NAD HEENT:  Normocephalic, atraumatic. EOMI, PERRLA.  Neck: JVP is normal. No thyromegaly. No bruits.  Lungs: clear to auscultation. No rales no wheezes.  Heart: Regular rate and rhythm. Normal S1, S2. No S3.  No significant murmurs. PMI not displaced.  Abdomen:  Supple, nontender. Normal bowel sounds. No masses. No hepatomegaly.  Extremities:   Good distal pulses throughout. No lower extremity edema.  Musculoskeletal :moving all extremities.  Neuro:   alert and oriented x3.  CN II-XII grossly intact.  EKG  Sinus rhythm.  83 bpm.  RBBB.  Assessment and Plan:

## 2010-12-28 NOTE — Patient Instructions (Signed)
Your physician wants you to follow-up in:  6 months. You will receive a reminder letter in the mail two months in advance. If you don't receive a letter, please call our office to schedule the follow-up appointment.   

## 2010-12-30 NOTE — Assessment & Plan Note (Signed)
Symptoms have improved.  I am not sure why.  I would not change regimen.

## 2010-12-30 NOTE — Assessment & Plan Note (Addendum)
Fair control  Better at other times.  Keep on same regimen.  Wii check BMET

## 2011-02-08 ENCOUNTER — Ambulatory Visit (INDEPENDENT_AMBULATORY_CARE_PROVIDER_SITE_OTHER): Payer: Medicare Other | Admitting: *Deleted

## 2011-02-08 DIAGNOSIS — I4891 Unspecified atrial fibrillation: Secondary | ICD-10-CM

## 2011-02-18 ENCOUNTER — Other Ambulatory Visit: Payer: Self-pay | Admitting: Pharmacist

## 2011-02-18 DIAGNOSIS — I4891 Unspecified atrial fibrillation: Secondary | ICD-10-CM

## 2011-02-18 MED ORDER — WARFARIN SODIUM 5 MG PO TABS: ORAL_TABLET | ORAL | Status: DC

## 2011-02-26 ENCOUNTER — Other Ambulatory Visit: Payer: Self-pay | Admitting: Internal Medicine

## 2011-02-26 MED ORDER — FLECAINIDE ACETATE 50 MG PO TABS: 50.0000 mg | ORAL_TABLET | Freq: Two times a day (BID) | ORAL | Status: DC

## 2011-03-01 ENCOUNTER — Other Ambulatory Visit: Payer: Self-pay

## 2011-03-01 MED ORDER — FLECAINIDE ACETATE 50 MG PO TABS: 50.0000 mg | ORAL_TABLET | Freq: Two times a day (BID) | ORAL | Status: DC

## 2011-03-08 ENCOUNTER — Ambulatory Visit (INDEPENDENT_AMBULATORY_CARE_PROVIDER_SITE_OTHER): Payer: Medicare Other | Admitting: *Deleted

## 2011-03-08 DIAGNOSIS — I4891 Unspecified atrial fibrillation: Secondary | ICD-10-CM

## 2011-03-28 ENCOUNTER — Ambulatory Visit (INDEPENDENT_AMBULATORY_CARE_PROVIDER_SITE_OTHER): Payer: Medicare Other | Admitting: Internal Medicine

## 2011-03-28 ENCOUNTER — Encounter: Payer: Self-pay | Admitting: Internal Medicine

## 2011-03-28 VITALS — BP 154/70 | HR 72 | Ht 69.0 in | Wt 142.0 lb

## 2011-03-28 DIAGNOSIS — Z8679 Personal history of other diseases of the circulatory system: Secondary | ICD-10-CM

## 2011-03-28 DIAGNOSIS — I4891 Unspecified atrial fibrillation: Secondary | ICD-10-CM

## 2011-03-28 DIAGNOSIS — I10 Essential (primary) hypertension: Secondary | ICD-10-CM

## 2011-03-28 LAB — PROTIME-INR
INR: 1.4 ratio — ABNORMAL HIGH (ref 0.8–1.0)
Prothrombin Time: 15.6 s — ABNORMAL HIGH (ref 10.2–12.4)

## 2011-03-28 LAB — BASIC METABOLIC PANEL
Chloride: 105 mEq/L (ref 96–112)
Creatinine, Ser: 1.3 mg/dL — ABNORMAL HIGH (ref 0.4–1.2)
Potassium: 4.4 mEq/L (ref 3.5–5.1)
Sodium: 140 mEq/L (ref 135–145)

## 2011-03-28 NOTE — Assessment & Plan Note (Signed)
BP is up and down   Better at home. Will ask her to increased atenolol to 25/12.5 per day.  She is very sensitive to meds. Exercise.  Follow BP.  Call if remains high

## 2011-03-28 NOTE — Assessment & Plan Note (Signed)
Discussed with S. Putt.  Will check Cr.  Based on previous would prob recomm Pradaxa 150 bid.  Will check INR today IN regards to palpitations, she was the one who pulled back on flecanide 75 bid as she felt this lead to increased HR varialbity. I have asked her to increase atenolol to 25 AM 12.5 pm  Will follow.

## 2011-03-28 NOTE — Patient Instructions (Signed)
Atenolol 25mg  one half a tab 2 times per day.  Lab work today. We will call you about starting Pradaxa.

## 2011-03-28 NOTE — Progress Notes (Signed)
HPI Patient is a 76 year old with a history of atrial flutter (s/p ablation) and afib.  I saw her in December.  When i saw her she was not having signif palpitations.   She returns today to discuss other anticoagulants. She says she feels her heart flutter not infrequently  No dizziness.  No signif SOB. Her bp has been labile, up at times.  She is taking 25 (1) of atenolol.  No Known Allergies  Current Outpatient Prescriptions  Medication Sig Dispense Refill  . alendronate (FOSAMAX) 70 MG tablet Take 70 mg by mouth every 7 (seven) days. Take with a full glass of water on an empty stomach.       Marland Kitchen aspirin 81 MG tablet Take 81 mg by mouth daily.        Marland Kitchen atenolol (TENORMIN) 25 MG tablet Pt takes if she needs it PRN      . Calcium Carbonate-Vitamin D (CALCIUM + D PO) 1 tab po qd       . diltiazem (DILACOR XR) 240 MG 24 hr capsule Take 1 capsule (240 mg total) by mouth daily.  30 capsule  11  . flecainide (TAMBOCOR) 50 MG tablet Take 1 tablet (50 mg total) by mouth 2 (two) times daily.  60 tablet  10  . levothyroxine (SYNTHROID) 50 MCG tablet Take 50 mcg by mouth daily.        . meloxicam (MOBIC) 15 MG tablet Take 15 mg by mouth as needed.        . Multiple Vitamin (MULTIVITAMIN PO) Take by mouth. Multivitamin for bones       . warfarin (COUMADIN) 5 MG tablet Take as directed by Anticoagulation clinic  35 tablet  3    Past Medical History  Diagnosis Date  . Atrial flutter     s/p ablation  . Palpitations   . HTN (hypertension)   . Atrial fibrillation     Past Surgical History  Procedure Date  . A flutter ablation   . Knee arthroscopy     Family History  Problem Relation Age of Onset  . Stroke Father 31    died from stroke at age 52  . Stroke Mother 25    died from stroke at age 74    History   Social History  . Marital Status: Married    Spouse Name: N/A    Number of Children: 1  . Years of Education: N/A   Occupational History  . Retired    Social History Main  Topics  . Smoking status: Never Smoker   . Smokeless tobacco: Not on file  . Alcohol Use: Not on file  . Drug Use: Not on file  . Sexually Active: Not on file   Other Topics Concern  . Not on file   Social History Narrative  . No narrative on file    Review of Systems:  All systems reviewed.  They are negative to the above problem except as previously stated.  Vital Signs: BP 154/70  Pulse 72  Ht 5\' 9"  (1.753 m)  Wt 142 lb (64.411 kg)  BMI 20.97 kg/m2  Physical Exam Patient is in NAD HEENT:  Normocephalic, atraumatic. EOMI, PERRLA.  Neck: JVP is normal. No thyromegaly. No bruits.  Lungs: clear to auscultation. No rales no wheezes.  Heart: Regular rate and rhythm. Normal S1, S2. No S3.   No significant murmurs. PMI not displaced.  Abdomen:  Supple, nontender. Normal bowel sounds. No masses. No hepatomegaly.  Extremities:   Good distal pulses throughout. No lower extremity edema.  Musculoskeletal :moving all extremities.  Neuro:   alert and oriented x3.  CN II-XII grossly intact.   Assessment and Plan:

## 2011-03-28 NOTE — Progress Notes (Signed)
Patient ID: Amy [name], female   DOB: 1935-12-21, 77 y.o.   MRN: [id number]

## 2011-04-01 ENCOUNTER — Other Ambulatory Visit: Payer: Self-pay | Admitting: *Deleted

## 2011-04-01 MED ORDER — DABIGATRAN ETEXILATE MESYLATE 150 MG PO CAPS: 150.0000 mg | ORAL_CAPSULE | Freq: Two times a day (BID) | ORAL | Status: DC

## 2011-04-16 ENCOUNTER — Telehealth: Payer: Self-pay | Admitting: Internal Medicine

## 2011-04-16 DIAGNOSIS — I4891 Unspecified atrial fibrillation: Secondary | ICD-10-CM

## 2011-04-16 DIAGNOSIS — Z79899 Other long term (current) drug therapy: Secondary | ICD-10-CM

## 2011-04-16 DIAGNOSIS — I10 Essential (primary) hypertension: Secondary | ICD-10-CM

## 2011-04-16 NOTE — Telephone Encounter (Signed)
Patient is taken Pradaxa 150 mg twice a day since 03/30/11  She is easily bruce and her face gets read her body and face has worm feelings during the night. Patient has only 3 pills left from the samples given. She would like to have more samples until MD decides if she  needs to continue or change to Xarelto. A 24/150 mg sample Pradaxa capsules placed at the front desk included a saving card, Patient aware.

## 2011-04-16 NOTE — Telephone Encounter (Signed)
Please return call to patient at [phone] regarding medication

## 2011-04-17 MED ORDER — RIVAROXABAN 15 MG PO TABS: 15.0000 mg | ORAL_TABLET | Freq: Every day | ORAL | Status: DC

## 2011-04-17 NOTE — Telephone Encounter (Signed)
Kennon Rounds,  Thoughts.  Is Xarelto 15 out?  She is fixated on this. Mult drug sensitivities

## 2011-04-17 NOTE — Telephone Encounter (Signed)
Patient called back. She will stop Pradaxa and start Xarelto 15mg  every day per Dr.Ross. Advised will let her know when to have BMET done again. Will ask Dr.Ross and call her back.

## 2011-04-17 NOTE — Telephone Encounter (Signed)
LMOM for CB

## 2011-04-17 NOTE — Telephone Encounter (Signed)
SCr from 3/7 was 1.3.  CrCl- 59mL/min.  Okay to use Xarelto 15mg  daily.  Pt should be aware that all anticoagulation agents have the potential to cause her to bruise easily.  I'm not sure how often she takes the meloxicam but the more she takes this, the more likely she will have bruising.

## 2011-04-19 NOTE — Telephone Encounter (Signed)
BMET in 1 week.  CBC at that time.

## 2011-04-25 NOTE — Telephone Encounter (Signed)
Pt aware and will come in on Monday for repeat labs as ordered by Dr Tenny Craw

## 2011-04-29 ENCOUNTER — Other Ambulatory Visit (INDEPENDENT_AMBULATORY_CARE_PROVIDER_SITE_OTHER): Payer: Medicare Other

## 2011-04-29 DIAGNOSIS — I4891 Unspecified atrial fibrillation: Secondary | ICD-10-CM

## 2011-04-29 DIAGNOSIS — Z79899 Other long term (current) drug therapy: Secondary | ICD-10-CM

## 2011-04-29 DIAGNOSIS — I10 Essential (primary) hypertension: Secondary | ICD-10-CM

## 2011-04-29 LAB — CBC WITH DIFFERENTIAL/PLATELET
Basophils Relative: 0.7 % (ref 0.0–3.0)
Eosinophils Relative: 0 % (ref 0.0–5.0)
Lymphocytes Relative: 20.9 % (ref 12.0–46.0)
MCV: 95 fl (ref 78.0–100.0)
Monocytes Absolute: 0.7 10*3/uL (ref 0.1–1.0)
Neutrophils Relative %: 66.1 % (ref 43.0–77.0)
RBC: 2.86 Mil/uL — ABNORMAL LOW (ref 3.87–5.11)
WBC: 5.6 10*3/uL (ref 4.5–10.5)

## 2011-04-29 LAB — BASIC METABOLIC PANEL
Chloride: 105 mEq/L (ref 96–112)
Creatinine, Ser: 1.4 mg/dL — ABNORMAL HIGH (ref 0.4–1.2)

## 2011-04-30 ENCOUNTER — Telehealth: Payer: Self-pay | Admitting: *Deleted

## 2011-04-30 DIAGNOSIS — D649 Anemia, unspecified: Secondary | ICD-10-CM

## 2011-04-30 DIAGNOSIS — Z79899 Other long term (current) drug therapy: Secondary | ICD-10-CM

## 2011-04-30 NOTE — Telephone Encounter (Signed)
Per note from dr ross--will contact pt and advise to hold xarelto, come in for CBC and Anemia panel and per s. Putt will give her stool cards to take home for testing stool for blood--at this time was not able to get pt on phone, but left message to call us back--cell phone not set up for messages--nt

## 2011-04-30 NOTE — Telephone Encounter (Signed)
Spoke with Amy [name].  She is aware to not take any more Xarelto until further notice.  She is going to come into the office tomorrow for lab work.  She states he had a stool sample done at the end of March.  She is going to bring those results tomorrow so we can review and decide if she needs to repeat this or not.

## 2011-05-01 ENCOUNTER — Ambulatory Visit (INDEPENDENT_AMBULATORY_CARE_PROVIDER_SITE_OTHER): Payer: Medicare Other | Admitting: *Deleted

## 2011-05-01 DIAGNOSIS — Z79899 Other long term (current) drug therapy: Secondary | ICD-10-CM

## 2011-05-01 DIAGNOSIS — D649 Anemia, unspecified: Secondary | ICD-10-CM

## 2011-05-01 LAB — CBC
HCT: 29.3 % — ABNORMAL LOW (ref 36.0–46.0)
Hemoglobin: 9.7 g/dL — ABNORMAL LOW (ref 12.0–15.0)
MCHC: 33.3 g/dL (ref 30.0–36.0)
MCV: 94.9 fl (ref 78.0–100.0)
Platelets: 177 10*3/uL (ref 150.0–400.0)
RBC: 3.08 Mil/uL — ABNORMAL LOW (ref 3.87–5.11)

## 2011-05-01 LAB — IBC PANEL
Saturation Ratios: 11.3 % — ABNORMAL LOW (ref 20.0–50.0)
Transferrin: 279 mg/dL (ref 212.0–360.0)

## 2011-05-02 ENCOUNTER — Encounter: Payer: Self-pay | Admitting: Gastroenterology

## 2011-05-02 NOTE — Telephone Encounter (Signed)
Called patient and advised she needs GI consult per Dr.Ross. Set up to see Dr.Jacobs on 4/30 at 915 am at [facility].

## 2011-05-03 MED ORDER — CARBONYL IRON 45 MG PO TABS: 45.0000 mg | ORAL_TABLET | Freq: Every day | ORAL | Status: DC

## 2011-05-03 NOTE — Telephone Encounter (Signed)
Patient will pick up stool cards today and after she completes this she will start [facility] per Dr.Ross.

## 2011-05-03 NOTE — Telephone Encounter (Signed)
Addended by: Vickie Epley on: 05/03/2011 11:06 AM   Modules accepted: Orders

## 2011-05-06 ENCOUNTER — Other Ambulatory Visit: Payer: Medicare Other

## 2011-05-06 LAB — HEMOCCULT SLIDES (X 3 CARDS)
OCCULT 1: NEGATIVE
OCCULT 2: NEGATIVE

## 2011-05-17 ENCOUNTER — Encounter: Payer: Self-pay | Admitting: Internal Medicine

## 2011-05-21 ENCOUNTER — Ambulatory Visit (INDEPENDENT_AMBULATORY_CARE_PROVIDER_SITE_OTHER): Payer: Medicare Other | Admitting: Gastroenterology

## 2011-05-21 ENCOUNTER — Encounter: Payer: Self-pay | Admitting: Gastroenterology

## 2011-05-21 VITALS — BP 142/76 | HR 60 | Ht 68.0 in | Wt 139.0 lb

## 2011-05-21 DIAGNOSIS — R195 Other fecal abnormalities: Secondary | ICD-10-CM

## 2011-05-21 DIAGNOSIS — D649 Anemia, unspecified: Secondary | ICD-10-CM

## 2011-05-21 DIAGNOSIS — Z800 Family history of malignant neoplasm of digestive organs: Secondary | ICD-10-CM

## 2011-05-21 MED ORDER — MOVIPREP 100 G PO SOLR: 1.0000 | ORAL | Status: DC

## 2011-05-21 NOTE — Patient Instructions (Addendum)
You will be set up for a colonoscopy (FH of colon cancer, new anemia) You will be set up for an upper endoscopy. (dark stools last month, anemia).

## 2011-05-21 NOTE — Progress Notes (Signed)
HPI: This is a  very pleasant 76 year old woman whom I am meeting for the first time today.  CBC 3-4 months ago showed a hemoglobin of 12, 3 weeks ago it was 9.1, 2 weeks ago 9.7. Iron levels were equal. Stool for occult blood, done at home was negative at this month   Low blood counts.  No overt gi bleeding.  No nose bleeding.  No vaginal bleeding.  She does now remember dark colored stools about a month ago, this went away after 5 days.  No nausea or abd pains.  Constipation is chornic for her, no changes recently.    Coumadin for about 1 year (afib), then pradaxa (she wanted to change), then xaralto (this was stopped due to anemia 3 weeks ago).    Colonoscopy at [facility] 11 years ago, no polyps and no cancers, +tics per patient.  Her brother had colon cancer.  Retired Theme park manager, very organized.  Takes meloxicam about 2-3 times a week.   Review of systems: Pertinent positive and negative review of systems were noted in the above HPI section. Complete review of systems was performed and was otherwise normal.    Past Medical History  Diagnosis Date  . Atrial flutter     s/p ablation  . Palpitations   . HTN (hypertension)   . Arthritis   . Anemia     Past Surgical History  Procedure Date  . A flutter ablation   . Knee arthroscopy     Current Outpatient Prescriptions  Medication Sig Dispense Refill  . alendronate (FOSAMAX) 70 MG tablet Take 70 mg by mouth every 7 (seven) days. Take with a full glass of water on an empty stomach.       Marland Kitchen aspirin 325 MG tablet Take 325 mg by mouth daily.      Marland Kitchen atenolol (TENORMIN) 25 MG tablet Pt takes if she needs it PRN      . Calcium Carbonate-Vitamin D (CALCIUM + D PO) 1 tab po qd       . carbonyl iron (FEOSOL) 45 MG TABS Take 1 tablet (45 mg total) by mouth daily.  30 tablet  1  . diltiazem (DILACOR XR) 240 MG 24 hr capsule Take 1 capsule (240 mg total) by mouth daily.  30 capsule  11  . flecainide (TAMBOCOR) 50 MG tablet  Take 1 tablet (50 mg total) by mouth 2 (two) times daily.  60 tablet  10  . levothyroxine (SYNTHROID) 50 MCG tablet Take 50 mcg by mouth daily.        . meloxicam (MOBIC) 15 MG tablet Take 15 mg by mouth as needed.        . Multiple Vitamin (MULTIVITAMIN PO) Take by mouth. Multivitamin for bones       . Rivaroxaban (XARELTO) 15 MG TABS tablet Take 15 mg by mouth daily with supper. ON HOLD      . DISCONTD: estrogen, conjugated,-medroxyprogesterone (PREMPRO) 0.3-1.5 MG per tablet Take 1 tablet by mouth 2 (two) times a week.       Marland Kitchen DISCONTD: Rivaroxaban (XARELTO) 15 MG TABS tablet Take 1 tablet (15 mg total) by mouth daily with supper.  30 tablet  3  . DISCONTD: warfarin (COUMADIN) 5 MG tablet Take as directed by Anticoagulation clinic  35 tablet  3    Allergies as of 05/21/2011  . (No Known Allergies)    Family History  Problem Relation Age of Onset  . Stroke Father 13    died from  stroke at age 64  . Stroke Mother 77    died from stroke at age 66  . Colon cancer Brother   . Heart disease Mother     History   Social History  . Marital Status: Married    Spouse Name: N/A    Number of Children: 1  . Years of Education: N/A   Occupational History  . Retired    Social History Main Topics  . Smoking status: Never Smoker   . Smokeless tobacco: Never Used  . Alcohol Use: No  . Drug Use: No  . Sexually Active: Not on file   Other Topics Concern  . Not on file   Social History Narrative   Occ caffeine        Physical Exam: BP 142/76  Pulse 60  Ht 5\' 8"  (1.727 m)  Wt 139 lb (63.05 kg)  BMI 21.13 kg/m2 Constitutional: generally well-appearing Psychiatric: alert and oriented x3 Eyes: extraocular movements intact Mouth: oral pharynx moist, no lesions Neck: supple no lymphadenopathy Cardiovascular: heart regular rate and rhythm Lungs: clear to auscultation bilaterally Abdomen: soft, nontender, nondistended, no obvious ascites, no peritoneal signs, normal bowel  sounds Extremities: no lower extremity edema bilaterally Skin: no lesions on visible extremities    Assessment and plan: 76 y.o. female with   family history of colon cancer, dark stools one month ago, last colonoscopy 11 years ago.  She is anemic.  We'll proceed with full colonoscopy and upper endoscopy if no clear source is identified in her colon. She is on meloxicam periodically, aspirin daily, she was on blood thinners up until a couple weeks ago. I think there is a good chance that she has peptic ulcer disease. She did have dark-colored stools for about 4 days of one month ago. She is on no proton pump inhibitors but pending her results of exams tomorrow I might start her on them.

## 2011-05-22 ENCOUNTER — Ambulatory Visit (AMBULATORY_SURGERY_CENTER): Payer: Medicare Other | Admitting: Gastroenterology

## 2011-05-22 ENCOUNTER — Encounter: Payer: Self-pay | Admitting: Gastroenterology

## 2011-05-22 VITALS — BP 137/59 | HR 84 | Temp 98.0°F | Resp 18 | Ht 68.0 in | Wt 139.0 lb

## 2011-05-22 DIAGNOSIS — K319 Disease of stomach and duodenum, unspecified: Secondary | ICD-10-CM

## 2011-05-22 DIAGNOSIS — D126 Benign neoplasm of colon, unspecified: Secondary | ICD-10-CM

## 2011-05-22 DIAGNOSIS — D649 Anemia, unspecified: Secondary | ICD-10-CM

## 2011-05-22 DIAGNOSIS — K2990 Gastroduodenitis, unspecified, without bleeding: Secondary | ICD-10-CM

## 2011-05-22 DIAGNOSIS — R195 Other fecal abnormalities: Secondary | ICD-10-CM

## 2011-05-22 DIAGNOSIS — Z800 Family history of malignant neoplasm of digestive organs: Secondary | ICD-10-CM

## 2011-05-22 DIAGNOSIS — K635 Polyp of colon: Secondary | ICD-10-CM

## 2011-05-22 MED ORDER — ESOMEPRAZOLE MAGNESIUM 40 MG PO CPDR: 40.0000 mg | DELAYED_RELEASE_CAPSULE | Freq: Every day | ORAL | Status: DC

## 2011-05-22 MED ORDER — SODIUM CHLORIDE 0.9 % IV SOLN: 500.0000 mL | INTRAVENOUS | Status: DC

## 2011-05-22 NOTE — Op Note (Signed)
[facility] [address] [place], [place]  [address]  COLONOSCOPY PROCEDURE REPORT  PATIENT:  Amy, [name]  MR#:  [id number] BIRTHDATE:  1935-12-28, 75 yrs. old  GENDER:  female ENDOSCOPIST:  Rachael Fee, MD REF. BY:  Samuel Jester, PROCEDURE DATE:  05/22/2011 PROCEDURE:  Colonoscopy with snare polypectomy ASA CLASS:  Class II INDICATIONS:  anemia, FH of colon cancer (brother) MEDICATIONS:   Fentanyl 50 mcg IV, These medications were titrated to patient response per physician's verbal order, Versed 8 mg IV  DESCRIPTION OF PROCEDURE:   After the risks benefits and alternatives of the procedure were thoroughly explained, informed consent was obtained.  Digital rectal exam was performed and revealed no rectal masses.   The LB [id number] endoscope was introduced through the anus and advanced to the cecum, which was identified by both the appendix and ileocecal valve, without limitations.  The quality of the prep was good..  The instrument was then slowly withdrawn as the colon was fully examined. <<PROCEDUREIMAGES>> FINDINGS:  A sessile polyp was found in the sigmoid colon. This was 7mm across, removed with cold snare and sent to pathology (jar 1) (see image4 and image5).  Moderate diverticulosis was found in the sigmoid to descending colon segments (see image1).  This was otherwise a normal examination of the colon (see image2, image3, and image6).   Retroflexed views in the rectum revealed no abnormalities. COMPLICATIONS:  None  ENDOSCOPIC IMPRESSION: 1) Sessile polyp in the sigmoid colon; this was removed and sent to pathology 2) Moderate diverticulosis in the sigmoid to descending colon segments 3) Otherwise normal examination  RECOMMENDATIONS: 1) Given your significant family history of colon cancer, you should have a repeat colonoscopy in 5 years even if the polyp removed today is NOT precancerous 2) EGD now to continue the GI workup of your  recent anemia  ______________________________ Rachael Fee, MD  n. eSIGNED:   Rachael Fee at 05/22/2011 09:54 AM  [name], [id number]

## 2011-05-22 NOTE — Patient Instructions (Addendum)

## 2011-05-22 NOTE — Op Note (Signed)
[facility] [address] [place], [place]  [address]  ENDOSCOPY PROCEDURE REPORT  PATIENT:  Amy [name], Amy [name]  MR#:  [id number] BIRTHDATE:  August 24, 1935, 75 yrs. old  GENDER:  female ENDOSCOPIST:  Rachael Fee, MD PROCEDURE DATE:  05/22/2011 PROCEDURE:  EGD with biopsy, [address] ASA CLASS:  Class II INDICATIONS:  dark stools one month ago, new anemia MEDICATIONS:    Fentanyl 25 mcg IV, These medications were titrated to patient response per physician's verbal order, There was residual sedation effect present from prior procedure, Versed 1 mg IV TOPICAL ANESTHETIC:  none  DESCRIPTION OF PROCEDURE:   After the risks benefits and alternatives of the procedure were thoroughly explained, informed consent was obtained.  The LB [id number] endoscope was introduced through the mouth and advanced to the second portion of the duodenum, without limitations.  The instrument was slowly withdrawn as the mucosa was fully examined. <<PROCEDUREIMAGES>> There was moderate gastritis, with erythema throughout and small erosions in distal stomach. Biopsies taken and sent to pathology (jar 1). There was mild duodenitis (see image3 and image5). Otherwise the examination was normal (see image6, image7, and image1).    Retroflexed views revealed no abnormalities.    The scope was then withdrawn from the patient and the procedure completed. COMPLICATIONS:  None  ENDOSCOPIC IMPRESSION: 1) Moderate gastritis and duodenitis, biopsied to check for H. pylori 2) Otherwise normal examination  RECOMMENDATIONS: Start once daily PPI (nexium called into your pharmacy). You should remain on this for as long as you require daily aspirin (indefinitely). If biopsies show H. pylori you will be started on appropriate antibiotics. It is safe to resume blood thinners if needed (I will forward this to Dr. Tenny Craw)  _____________________________ Rachael Fee, MD  cc: Samuel Jester, MD; Dietrich Pates,  MD  n. eSIGNED:   Rachael Fee at 05/22/2011 10:06 AM  [name], [id number]

## 2011-05-22 NOTE — Progress Notes (Signed)
Pt and husband told to call Dr. Charlott Rakes office re: restarting blood thinners and understanding voiced.  Patient did not experience any of the following events: a burn prior to discharge; a fall within the facility; wrong site/side/patient/procedure/implant event; or a hospital transfer or hospital admission upon discharge from the facility. [phone]) Patient did not have preoperative order for IV antibiotic SSI prophylaxis. [phone])

## 2011-05-23 ENCOUNTER — Telehealth: Payer: Self-pay | Admitting: *Deleted

## 2011-05-23 NOTE — Telephone Encounter (Signed)
  Follow up Call-  Call back number 05/22/2011  Post procedure Call Back phone  # [phone]  Permission to leave phone message Yes     Patient questions:  Do you have a fever, pain , or abdominal swelling? no Pain Score  0 *  Have you tolerated food without any problems? yes  Have you been able to return to your normal activities? yes  Do you have any questions about your discharge instructions: Diet   no Medications  no Follow up visit  no  Do you have questions or concerns about your Care? no  Actions: * If pain score is 4 or above: No action needed, pain <4.

## 2011-05-24 ENCOUNTER — Ambulatory Visit (INDEPENDENT_AMBULATORY_CARE_PROVIDER_SITE_OTHER): Payer: Medicare Other | Admitting: Internal Medicine

## 2011-05-24 ENCOUNTER — Encounter: Payer: Self-pay | Admitting: Internal Medicine

## 2011-05-24 VITALS — BP 131/65 | HR 76 | Ht 68.0 in | Wt 139.0 lb

## 2011-05-24 DIAGNOSIS — K2970 Gastritis, unspecified, without bleeding: Secondary | ICD-10-CM

## 2011-05-24 MED ORDER — OMEPRAZOLE 20 MG PO CPDR: 20.0000 mg | DELAYED_RELEASE_CAPSULE | Freq: Two times a day (BID) | ORAL | Status: DC

## 2011-05-24 NOTE — Progress Notes (Signed)
HPI Patient is a 76 year old with a hsitory of atrial flutter (s/p ablation), afib and HTN.  I saw her back in March.  She had been on coumadin Rx and then switched to pradaxa.  Did not tolerate.  Switched to [employer].  Hgb dropped some to 9s.  I stopped and referred patient to GI. COlonoscopy showed 1 polyp as well as diverticulosis.  EGD showed moderate gastritis and duodenitis. No Known Allergies  Current Outpatient Prescriptions  Medication Sig Dispense Refill  . alendronate (FOSAMAX) 70 MG tablet Take 70 mg by mouth every 7 (seven) days. Take with a full glass of water on an empty stomach.       Marland Kitchen aspirin 325 MG tablet Take 325 mg by mouth daily.      Marland Kitchen atenolol (TENORMIN) 25 MG tablet Pt takes if she needs it PRN      . Calcium Carbonate-Vitamin D (CALCIUM + D PO) 1 tab po qd       . carbonyl iron (FEOSOL) 45 MG TABS Take 1 tablet (45 mg total) by mouth daily.  30 tablet  1  . diltiazem (DILACOR XR) 240 MG 24 hr capsule Take 1 capsule (240 mg total) by mouth daily.  30 capsule  11  . esomeprazole (NEXIUM) 40 MG capsule Take 1 capsule (40 mg total) by mouth daily.  30 capsule  11  . flecainide (TAMBOCOR) 50 MG tablet Take 1 tablet (50 mg total) by mouth 2 (two) times daily.  60 tablet  10  . levothyroxine (SYNTHROID) 50 MCG tablet Take 50 mcg by mouth daily.        . meloxicam (MOBIC) 15 MG tablet Take 15 mg by mouth as needed.        . Multiple Vitamin (MULTIVITAMIN PO) Take by mouth. Multivitamin for bones       . Rivaroxaban (XARELTO) 15 MG TABS tablet Take 15 mg by mouth daily with supper. ON HOLD      . DISCONTD: estrogen, conjugated,-medroxyprogesterone (PREMPRO) 0.3-1.5 MG per tablet Take 1 tablet by mouth 2 (two) times a week.       Marland Kitchen DISCONTD: warfarin (COUMADIN) 5 MG tablet Take as directed by Anticoagulation clinic  35 tablet  3    Past Medical History  Diagnosis Date  . Atrial flutter     s/p ablation  . Palpitations   . HTN (hypertension)   . Arthritis   . Anemia      Past Surgical History  Procedure Date  . A flutter ablation   . Knee arthroscopy   . Tubal ligation     Family History  Problem Relation Age of Onset  . Stroke Father 32    died from stroke at age 2  . Stroke Mother 40    died from stroke at age 23  . Colon cancer Brother   . Heart disease Mother     History   Social History  . Marital Status: Married    Spouse Name: N/A    Number of Children: 1  . Years of Education: N/A   Occupational History  . Retired    Social History Main Topics  . Smoking status: Never Smoker   . Smokeless tobacco: Never Used  . Alcohol Use: No  . Drug Use: No  . Sexually Active: Not on file   Other Topics Concern  . Not on file   Social History Narrative   Occ caffeine     Review of Systems:  All  systems reviewed.  They are negative to the above problem except as previously stated.  Vital Signs: BP 131/65  Pulse 76  Ht 5\' 8"  (1.727 m)  Wt 139 lb (63.05 kg)  BMI 21.13 kg/m2  Physical Exam  HEENT:  Normocephalic, atraumatic. EOMI, PERRLA.  Neck: JVP is normal. No thyromegaly. No bruits.  Lungs: clear to auscultation. No rales no wheezes.  Heart: Regular rate and rhythm. Normal S1, S2. No S3.   No significant murmurs. PMI not displaced.  Abdomen:  Supple, nontender. Normal bowel sounds. No masses. No hepatomegaly.  Extremities:   Good distal pulses throughout. No lower extremity edema.  Musculoskeletal :moving all extremities.  Neuro:   alert and oriented x3.  CN II-XII grossly intact.   Assessment and Plan:   1.  Atrial fib.  Intermittent. With age, history HTN would recomm continued anticoag.  With recent EGD showing gastritis, would take PPI and hold Xarelto for another week Will need to follow H/H Continue other meds. 2.  HTN  Continue meds.

## 2011-05-24 NOTE — Patient Instructions (Signed)
Omeprazole 20mg  2 times per day times 1 month then 1 per day  Aspirin for 1 week then stop.  Start Xarelto in 1 week.

## 2011-05-31 ENCOUNTER — Encounter: Payer: Self-pay | Admitting: Gastroenterology

## 2011-06-05 ENCOUNTER — Other Ambulatory Visit: Payer: Self-pay | Admitting: Internal Medicine

## 2011-06-05 MED ORDER — DILTIAZEM HCL ER 240 MG PO CP24: 240.0000 mg | ORAL_CAPSULE | Freq: Every day | ORAL | Status: DC

## 2011-06-06 ENCOUNTER — Telehealth: Payer: Self-pay | Admitting: Internal Medicine

## 2011-06-06 NOTE — Telephone Encounter (Signed)
New msg Pt wants to know should she resume taking aspirin and what dosage. She said if she is not there you can leave a message

## 2011-06-06 NOTE — Telephone Encounter (Signed)
Called patient back and advised no aspirin while taking  Xarelto per Dr.Ross' last note. Will let her know that patient called.

## 2011-06-26 ENCOUNTER — Ambulatory Visit: Payer: Self-pay | Admitting: Cardiovascular Disease

## 2011-06-26 DIAGNOSIS — I4891 Unspecified atrial fibrillation: Secondary | ICD-10-CM

## 2011-08-12 ENCOUNTER — Encounter: Payer: Self-pay | Admitting: Internal Medicine

## 2011-09-10 ENCOUNTER — Other Ambulatory Visit: Payer: Self-pay | Admitting: Internal Medicine

## 2011-09-28 ENCOUNTER — Other Ambulatory Visit: Payer: Self-pay | Admitting: Internal Medicine

## 2011-10-04 ENCOUNTER — Ambulatory Visit: Payer: Medicare Other | Admitting: Internal Medicine

## 2011-11-14 ENCOUNTER — Other Ambulatory Visit: Payer: Self-pay | Admitting: Internal Medicine

## 2011-11-27 ENCOUNTER — Other Ambulatory Visit: Payer: Self-pay | Admitting: Internal Medicine

## 2011-11-29 ENCOUNTER — Encounter: Payer: Self-pay | Admitting: Internal Medicine

## 2011-11-29 ENCOUNTER — Ambulatory Visit (INDEPENDENT_AMBULATORY_CARE_PROVIDER_SITE_OTHER): Payer: Medicare Other | Admitting: Internal Medicine

## 2011-11-29 VITALS — BP 140/64 | HR 65 | Ht 68.0 in | Wt 141.0 lb

## 2011-11-29 DIAGNOSIS — I4891 Unspecified atrial fibrillation: Secondary | ICD-10-CM

## 2011-11-29 LAB — CBC WITH DIFFERENTIAL/PLATELET
Basophils Relative: 0.3 % (ref 0.0–3.0)
Eosinophils Relative: 0.7 % (ref 0.0–5.0)
HCT: 37.7 % (ref 36.0–46.0)
Lymphs Abs: 1.8 10*3/uL (ref 0.7–4.0)
MCV: 93.6 fl (ref 78.0–100.0)
Monocytes Absolute: 0.8 10*3/uL (ref 0.1–1.0)
Neutro Abs: 5.3 10*3/uL (ref 1.4–7.7)
RBC: 4.02 Mil/uL (ref 3.87–5.11)
WBC: 7.9 10*3/uL (ref 4.5–10.5)

## 2011-11-29 NOTE — Patient Instructions (Signed)
Lab work today We will call you with results.  Your physician wants you to follow-up in: August 2014 You will receive a reminder letter in the mail two months in advance. If you don't receive a letter, please call our office to schedule the follow-up appointment.  

## 2011-11-29 NOTE — Progress Notes (Signed)
HPI Patient is a 76 year old with a hsitory of atrial flutter (s/p ablation), afib and HTN. I saw her back in May. SInce seen she has done well.  No signif palpitations.  Breathing is good.    Has kept a bp log.  BP is in 150s to 160s at times.   No Known Allergies  Current Outpatient Prescriptions  Medication Sig Dispense Refill  . alendronate (FOSAMAX) 70 MG tablet Take 70 mg by mouth every 7 (seven) days. Take with a full glass of water on an empty stomach.       Marland Kitchen atenolol (TENORMIN) 25 MG tablet Pt takes if she needs it PRN      . Calcium Carbonate-Vitamin D (CALCIUM + D PO) 1 tab po qd       . diltiazem (DILACOR XR) 240 MG 24 hr capsule Take 1 capsule (240 mg total) by mouth daily.  30 capsule  11  . flecainide (TAMBOCOR) 50 MG tablet TAKE 1 TABLET BY MOUTH TWO TIMES DAILY  60 tablet  5  . levothyroxine (SYNTHROID) 50 MCG tablet Take 50 mcg by mouth daily.        . meloxicam (MOBIC) 15 MG tablet Take 15 mg by mouth as needed.        . Multiple Vitamin (MULTIVITAMIN PO) Take by mouth. Multivitamin for bones       . XARELTO 15 MG TABS tablet TAKE 1 TABLET EVERY DAY WITH SUPPER  30 tablet  0  . [DISCONTINUED] estrogen, conjugated,-medroxyprogesterone (PREMPRO) 0.3-1.5 MG per tablet Take 1 tablet by mouth 2 (two) times a week.       . [DISCONTINUED] warfarin (COUMADIN) 5 MG tablet Take as directed by Anticoagulation clinic  35 tablet  3    Past Medical History  Diagnosis Date  . Atrial flutter     s/p ablation  . Palpitations   . HTN (hypertension)   . Arthritis   . Anemia     Past Surgical History  Procedure Date  . A flutter ablation   . Knee arthroscopy   . Tubal ligation     Family History  Problem Relation Age of Onset  . Stroke Father 43    died from stroke at age 62  . Stroke Mother 53    died from stroke at age 76  . Colon cancer Brother   . Heart disease Mother     History   Social History  . Marital Status: Married    Spouse Name: N/A    Number of  Children: 1  . Years of Education: N/A   Occupational History  . Retired    Social History Main Topics  . Smoking status: Never Smoker   . Smokeless tobacco: Never Used  . Alcohol Use: No  . Drug Use: No  . Sexually Active: Not on file   Other Topics Concern  . Not on file   Social History Narrative   Occ caffeine     Review of Systems:  All systems reviewed.  They are negative to the above problem except as previously stated.  Vital Signs: BP 140/64  Pulse 65  Ht 5\' 8"  (1.727 m)  Wt 141 lb (63.957 kg)  BMI 21.44 kg/m2  Physical Exam  Patient is in NAD HEENT:  Normocephalic, atraumatic. EOMI, PERRLA.  Neck: JVP is normal.  No bruits.  Lungs: clear to auscultation. No rales no wheezes.  Heart: Regular rate and rhythm. Normal S1, S2. No S3.   No  significant murmurs. PMI not displaced.  Abdomen:  Supple, nontender. Normal bowel sounds. No masses. No hepatomegaly.  Extremities:   Good distal pulses throughout. No lower extremity edema.  Musculoskeletal :moving all extremities.  Neuro:   alert and oriented x3.  CN II-XII grossly intact.  EKG:  SR  65 bpm  RBBB.    Assessment and Plan:  1.  Atrial arrhythmias.  Doing well.  No significant symptoms.   2.  HTN  I would recomm that she take atenolol regularly  Follow BP and HR.

## 2011-12-15 ENCOUNTER — Other Ambulatory Visit: Payer: Self-pay | Admitting: Internal Medicine

## 2011-12-16 ENCOUNTER — Telehealth: Payer: Self-pay | Admitting: Internal Medicine

## 2011-12-16 NOTE — Telephone Encounter (Signed)
New PRoblem:    Patient called in wanting to speak with you about her BP.  It had risen to 185/106 yesterday and after taking three of her atenolol (TENORMIN) 25 MG tablet was 150 this morning.  Please call back.

## 2011-12-16 NOTE — Telephone Encounter (Signed)
Called patient back. She wanted to report her BP readings. States that she feels fine without any complaints. No increased salt intake.  BP in the AM around 130/50 to 130/65 and in the PM up to 185/70. Heart rate is regular between 52 and 72. The message taken stating BP of 185/106 is INCORRECT per the patient. She is currently taking Atenolol 25mg  2 times per day,Cardizem 240mg  every day, and Flecainide 50mg  2 times per day.  Advised will pass message to Dr.Ross and call her back.

## 2011-12-17 NOTE — Telephone Encounter (Signed)
Patient's bp is up/down.  One concern is if cuff she is using is still reliable Important to know this if she is making med changes In the office BP was good (140/70_  OK to take 2 tenormin at mid day (say 4 PM) and follow BP May need switch to another agent See if she can use another cuff, check elswhere Do we have cuff at office to lend  BP is not in danger zone if truly high

## 2011-12-17 NOTE — Telephone Encounter (Signed)
LMOM for call back. 

## 2011-12-17 NOTE — Telephone Encounter (Signed)
Patient called back. Advised her to have cuff checked for accuracy at office near her home and if BP continues to rise at the end of the day, she will take Atenolol 50mg  at 4 pm and monitor.

## 2012-03-02 ENCOUNTER — Telehealth: Payer: Self-pay | Admitting: Internal Medicine

## 2012-03-02 NOTE — Telephone Encounter (Signed)
Pt's insurance needs prior auth for xaerlto, [phone], pt requesting call when done [phone]

## 2012-03-02 NOTE — Telephone Encounter (Signed)
Completed and pt aware.

## 2012-05-28 ENCOUNTER — Other Ambulatory Visit: Payer: Self-pay | Admitting: Internal Medicine

## 2012-08-21 ENCOUNTER — Encounter: Payer: Self-pay | Admitting: Internal Medicine

## 2012-08-21 ENCOUNTER — Ambulatory Visit (INDEPENDENT_AMBULATORY_CARE_PROVIDER_SITE_OTHER): Payer: Medicare Other | Admitting: Internal Medicine

## 2012-08-21 VITALS — BP 159/61 | HR 73 | Ht 67.0 in | Wt 140.0 lb

## 2012-08-21 DIAGNOSIS — I4891 Unspecified atrial fibrillation: Secondary | ICD-10-CM

## 2012-08-21 LAB — CBC WITH DIFFERENTIAL/PLATELET
Basophils Relative: 0.4 % (ref 0.0–3.0)
Eosinophils Absolute: 0 10*3/uL (ref 0.0–0.7)
MCHC: 33.8 g/dL (ref 30.0–36.0)
MCV: 92.3 fl (ref 78.0–100.0)
Monocytes Absolute: 0.7 10*3/uL (ref 0.1–1.0)
Neutrophils Relative %: 69.9 % (ref 43.0–77.0)
Platelets: 208 10*3/uL (ref 150.0–400.0)
RDW: 13.4 % (ref 11.5–14.6)

## 2012-08-21 LAB — BASIC METABOLIC PANEL
BUN: 26 mg/dL — ABNORMAL HIGH (ref 6–23)
GFR: 36.25 mL/min — ABNORMAL LOW (ref >60.00)
Potassium: 4.2 mEq/L (ref 3.5–5.1)
Sodium: 139 mEq/L (ref 135–145)

## 2012-08-21 MED ORDER — ATENOLOL 25 MG PO TABS: 25.0000 mg | ORAL_TABLET | ORAL | Status: DC | PRN

## 2012-08-21 NOTE — Patient Instructions (Addendum)
No medication changes were made today  You will need lab work today:  Cbc, bmp  We will call you with your results.   Your physician wants you to follow-up in: March 2015 with Dr. Tenny Craw.  You will receive a reminder letter in the mail two months in advance. If you don't receive a letter, please call our office to schedule the follow-up appointment.

## 2012-08-21 NOTE — Progress Notes (Signed)
HPI Patient is a 77 year old with a hsitory of atrial flutter (s/p ablation), afib and HTN. I saw her back in May.  SInce seen she has done well. No signif palpitations. Breathing is good.  BP at home is OK She was last in clinic in [place] 2013 Admits to taking Xarelto every other day. No Known Allergies  Current Outpatient Prescriptions  Medication Sig Dispense Refill  . alendronate (FOSAMAX) 70 MG tablet Take 70 mg by mouth every 7 (seven) days. Take with a full glass of water on an empty stomach.       Marland Kitchen atenolol (TENORMIN) 25 MG tablet Pt takes if she needs it PRN      . Calcium Carbonate-Vitamin D (CALCIUM + D PO) 1 tab po qd       . diltiazem (CARDIZEM CD) 240 MG 24 hr capsule TAKE ONE CAPSULE BY MOUTH EVERY DAY  30 capsule  6  . flecainide (TAMBOCOR) 50 MG tablet TAKE 1 TABLET BY MOUTH TWO TIMES DAILY  60 tablet  5  . levothyroxine (SYNTHROID) 50 MCG tablet Take 50 mcg by mouth daily.        . meloxicam (MOBIC) 15 MG tablet Take 15 mg by mouth as needed.        . Multiple Vitamin (MULTIVITAMIN PO) Take by mouth. Multivitamin for bones       . XARELTO 15 MG TABS tablet TAKE 1 TABLET BY MOUTH EVERY DAY WITH SUPPER  30 tablet  5  . [DISCONTINUED] estrogen, conjugated,-medroxyprogesterone (PREMPRO) 0.3-1.5 MG per tablet Take 1 tablet by mouth 2 (two) times a week.       . [DISCONTINUED] warfarin (COUMADIN) 5 MG tablet Take as directed by Anticoagulation clinic  35 tablet  3   No current facility-administered medications for this visit.    Past Medical History  Diagnosis Date  . Atrial flutter     s/p ablation  . Palpitations   . HTN (hypertension)   . Arthritis   . Anemia     Past Surgical History  Procedure Laterality Date  . A flutter ablation    . Knee arthroscopy    . Tubal ligation      Family History  Problem Relation Age of Onset  . Stroke Father 48    died from stroke at age 2  . Stroke Mother 57    died from stroke at age 12  . Colon cancer Brother   .  Heart disease Mother     History   Social History  . Marital Status: Married    Spouse Name: N/A    Number of Children: 1  . Years of Education: N/A   Occupational History  . Retired    Social History Main Topics  . Smoking status: Never Smoker   . Smokeless tobacco: Never Used  . Alcohol Use: No  . Drug Use: No  . Sexually Active: Not on file   Other Topics Concern  . Not on file   Social History Narrative   Occ caffeine     Review of Systems:  All systems reviewed.  They are negative to the above problem except as previously stated.  Vital Signs: BP 159/61  Pulse 73  Ht 5\' 7"  (1.702 m)  Wt 140 lb (63.504 kg)  BMI 21.92 kg/m2  SpO2 100%  Physical Exam Patient is in NAD HEENT:  Normocephalic, atraumatic. EOMI, PERRLA.  Neck: JVP is normal.  No bruits.  Lungs: clear to auscultation. No rales  no wheezes.  Heart: Regular rate and rhythm. Normal S1, S2. No S3.   No significant murmurs. PMI not displaced.  Abdomen:  Supple, nontender. Normal bowel sounds. No masses. No hepatomegaly.  Extremities:   Good distal pulses throughout. No lower extremity edema.  Musculoskeletal :moving all extremities.  Neuro:   alert and oriented x3.  CN II-XII grossly intact.  EKG;  SR   73 bpm.  RBBB.  Assessment and Plan:  1.  Arrhythmia  Patient doing well  No signif palpitaitons.  Check labs today  I have told her she needs to take Xarelto daily  2.  HTN  BP is increased on my check.  Follow for now.  She will have checked with cuff calibrated at home

## 2012-08-25 ENCOUNTER — Telehealth: Payer: Self-pay | Admitting: Cardiology

## 2012-08-25 ENCOUNTER — Other Ambulatory Visit: Payer: Self-pay | Admitting: *Deleted

## 2012-08-25 ENCOUNTER — Telehealth: Payer: Self-pay | Admitting: *Deleted

## 2012-08-25 DIAGNOSIS — I4891 Unspecified atrial fibrillation: Secondary | ICD-10-CM

## 2012-08-25 MED ORDER — RIVAROXABAN 15 MG PO TABS: ORAL_TABLET | ORAL | Status: DC

## 2012-08-25 NOTE — Telephone Encounter (Signed)
Message copied by Barrie Folk on Tue Aug 25, 2012  4:27 PM ------      Message from: Dietrich Pates V      Created: Tue Aug 25, 2012  3:35 PM       CBC shows Hgb is minimally decreased      BMET Amy [name] appears to be a little on dry side      I would reocmm that she increase her fluid intake during day so that she is urinating more      WOuld repeat BMET and CBC in about 4 wks. ------

## 2012-08-25 NOTE — Telephone Encounter (Signed)
I spoke with pt & husband about lab results She will come in 09/28/12 for repeat lab work. They will be back from [facility] by then. Will increase fluid intake. Mylo Red RN

## 2012-08-25 NOTE — Telephone Encounter (Signed)
I spoke with the patient. She has reviewed her results on my chart.

## 2012-08-25 NOTE — Telephone Encounter (Signed)
New Prob    Pt calling about previous lab work on Aug 2.

## 2012-08-26 ENCOUNTER — Other Ambulatory Visit: Payer: Self-pay

## 2012-09-28 ENCOUNTER — Other Ambulatory Visit (INDEPENDENT_AMBULATORY_CARE_PROVIDER_SITE_OTHER): Payer: Medicare Other

## 2012-09-28 DIAGNOSIS — I4891 Unspecified atrial fibrillation: Secondary | ICD-10-CM

## 2012-09-28 LAB — CBC WITH DIFFERENTIAL/PLATELET
Basophils Absolute: 0 10*3/uL (ref 0.0–0.1)
Basophils Relative: 0.3 % (ref 0.0–3.0)
Hemoglobin: 11.6 g/dL — ABNORMAL LOW (ref 12.0–15.0)
Lymphocytes Relative: 24.8 % (ref 12.0–46.0)
Monocytes Relative: 10 % (ref 3.0–12.0)
Neutro Abs: 4.1 10*3/uL (ref 1.4–7.7)
RBC: 3.79 Mil/uL — ABNORMAL LOW (ref 3.87–5.11)
WBC: 6.4 10*3/uL (ref 4.5–10.5)

## 2012-09-28 LAB — BASIC METABOLIC PANEL
BUN: 16 mg/dL (ref 6–23)
CO2: 29 mEq/L (ref 19–32)
Chloride: 105 mEq/L (ref 96–112)
Creatinine, Ser: 1.2 mg/dL (ref 0.4–1.2)

## 2012-11-25 ENCOUNTER — Other Ambulatory Visit: Payer: Self-pay

## 2012-11-25 MED ORDER — FLECAINIDE ACETATE 50 MG PO TABS: ORAL_TABLET | ORAL | Status: DC

## 2012-11-26 ENCOUNTER — Other Ambulatory Visit: Payer: Self-pay

## 2012-12-26 ENCOUNTER — Other Ambulatory Visit: Payer: Self-pay | Admitting: Internal Medicine

## 2013-02-01 ENCOUNTER — Telehealth: Payer: Self-pay | Admitting: Internal Medicine

## 2013-02-01 NOTE — Telephone Encounter (Signed)
Spoke with [facility] and appt made for pt to see Dr. Simeon Craft on February 02, 2013 at 2:00. I spoke with pt and gave her appt time and address and phone number for [facility].

## 2013-02-01 NOTE — Telephone Encounter (Signed)
New message   C/o nose bleeds x 4 . Stop taken xarelto on 1/6 .

## 2013-02-01 NOTE — Telephone Encounter (Signed)
Spoke with pt who reports she had first nose bleed in October. Since then has had 4 nosebleeds since end of December. Had one on 1/6 and stopped Xarelto at that time. Also had one on 1/10 and earlier today. Episode today was "not bad" per pt report. She reports chronic nasal drainage and earlier today noticed blood in this drainage. She pinched nose and it stopped after 5 minutes. I explained to pt that it is not uncommon to have occasional nose bleeds while on Xarelto. She is hesitant to resume Xarelto.  I told pt I would review with Dr. Harrington Challenger

## 2013-02-01 NOTE — Telephone Encounter (Signed)
Reviewed with Dr. Harrington Challenger who wants pt to stay off Xarelto for now and start ASA 325 mg daily. Pt will need to eventually go back on Xarelto but cause of bleeding needs to be determined first. Refer pt to [facility] for appt as soon as possible.I spoke with pt and gave her this information.

## 2013-02-03 NOTE — Telephone Encounter (Signed)
Spoke with pt, she had two spots in her nose that dr Simeon Craft cauterized. She saw him yesterday. She cont off the xarelto, she cont to take the aspirin. Will discuss with dr Harrington Challenger regarding when the pt should restart the xarelto. Pt agreed with this plan.

## 2013-02-04 NOTE — Telephone Encounter (Signed)
Can Dr Theressa Millard office fax his clnic note to review?

## 2013-02-05 NOTE — Telephone Encounter (Signed)
Left message for medical records in dr gore's office to fax note.

## 2013-02-08 NOTE — Telephone Encounter (Signed)
Note from dr Simeon Craft reviewed by dr Harrington Challenger, Spoke with pt, aware to stop aspirin and restart xarelto today. Patient voiced understanding

## 2013-03-22 ENCOUNTER — Ambulatory Visit: Payer: Medicare Other | Admitting: Internal Medicine

## 2013-03-22 ENCOUNTER — Other Ambulatory Visit: Payer: Self-pay | Admitting: *Deleted

## 2013-03-22 ENCOUNTER — Other Ambulatory Visit: Payer: Self-pay

## 2013-03-22 MED ORDER — FLECAINIDE ACETATE 50 MG PO TABS
ORAL_TABLET | ORAL | Status: DC
Start: 2013-03-22 — End: 2013-08-11

## 2013-03-22 MED ORDER — DILTIAZEM HCL ER COATED BEADS 240 MG PO CP24
ORAL_CAPSULE | ORAL | Status: DC
Start: 2013-03-22 — End: 2013-05-26

## 2013-04-19 ENCOUNTER — Telehealth: Payer: Self-pay | Admitting: *Deleted

## 2013-04-19 NOTE — Telephone Encounter (Signed)
PA to [facility] for xarelto 

## 2013-04-19 NOTE — Telephone Encounter (Signed)
PA for patients xarelto approved through 04/19/2014, [place] # [id number]

## 2013-05-03 ENCOUNTER — Ambulatory Visit (INDEPENDENT_AMBULATORY_CARE_PROVIDER_SITE_OTHER): Payer: Medicare Other | Admitting: Internal Medicine

## 2013-05-03 ENCOUNTER — Encounter: Payer: Self-pay | Admitting: Internal Medicine

## 2013-05-03 VITALS — BP 130/74 | HR 60 | Ht 67.0 in | Wt 141.0 lb

## 2013-05-03 DIAGNOSIS — I491 Atrial premature depolarization: Secondary | ICD-10-CM

## 2013-05-03 DIAGNOSIS — Z79899 Other long term (current) drug therapy: Secondary | ICD-10-CM

## 2013-05-03 DIAGNOSIS — E875 Hyperkalemia: Secondary | ICD-10-CM

## 2013-05-03 DIAGNOSIS — I4891 Unspecified atrial fibrillation: Secondary | ICD-10-CM

## 2013-05-03 LAB — BASIC METABOLIC PANEL
BUN: 18 mg/dL (ref 6–23)
CHLORIDE: 103 meq/L (ref 96–112)
CO2: 28 meq/L (ref 19–32)
Calcium: 9.3 mg/dL (ref 8.4–10.5)
Creatinine, Ser: 1.2 mg/dL (ref 0.4–1.2)
GFR: 48.79 mL/min — ABNORMAL LOW (ref >60.00)
GLUCOSE: 85 mg/dL (ref 70–99)
Potassium: 4.3 mEq/L (ref 3.5–5.1)
Sodium: 139 mEq/L (ref 135–145)

## 2013-05-03 LAB — CBC
HEMATOCRIT: 35.4 % — AB (ref 36.0–46.0)
HEMOGLOBIN: 11.9 g/dL — AB (ref 12.0–15.0)
MCHC: 33.4 g/dL (ref 30.0–36.0)
MCV: 93.2 fl (ref 78.0–100.0)
Platelets: 211 10*3/uL (ref 150.0–400.0)
RBC: 3.8 Mil/uL — ABNORMAL LOW (ref 3.87–5.11)
RDW: 13.2 % (ref 11.5–14.6)
WBC: 8.2 10*3/uL (ref 4.5–10.5)

## 2013-05-03 NOTE — Progress Notes (Signed)
HPI Patient is a 78 year old with a hsitory of atrial flutter (s/p ablation), afib and HTN. I saw her back in August Since seen she has done well  Rare palpitaition  Breathing is good.   Occasionally forgets Xarelto.    No Known Allergies  Current Outpatient Prescriptions  Medication Sig Dispense Refill  . alendronate (FOSAMAX) 70 MG tablet Take 70 mg by mouth every 7 (seven) days. Take with a full glass of water on an empty stomach.       Marland Kitchen atenolol (TENORMIN) 25 MG tablet Take 1 tablet (25 mg total) by mouth as needed. Pt takes if she needs it PRN  90 tablet  3  . Calcium Carbonate-Vitamin D (CALCIUM + D PO) 1 tab po qd       . diltiazem (CARDIZEM CD) 240 MG 24 hr capsule TAKE ONE CAPSULE BY MOUTH EVERY DAY  90 capsule  0  . flecainide (TAMBOCOR) 50 MG tablet TAKE 1 TABLET BY MOUTH TWO TIMES DAILY  180 tablet  0  . levothyroxine (SYNTHROID) 50 MCG tablet Take 50 mcg by mouth daily.        . meloxicam (MOBIC) 15 MG tablet Take 15 mg by mouth as needed.        . Multiple Vitamin (MULTIVITAMIN PO) Take by mouth. Multivitamin for bones       . Rivaroxaban (XARELTO) 15 MG TABS tablet TAKE 1 TABLET BY MOUTH EVERY DAY WITH SUPPER  30 tablet  5  . [DISCONTINUED] estrogen, conjugated,-medroxyprogesterone (PREMPRO) 0.3-1.5 MG per tablet Take 1 tablet by mouth 2 (two) times a week.       . [DISCONTINUED] warfarin (COUMADIN) 5 MG tablet Take as directed by Anticoagulation clinic  35 tablet  3   No current facility-administered medications for this visit.    Past Medical History  Diagnosis Date  . Atrial flutter     s/p ablation  . Palpitations   . HTN (hypertension)   . Arthritis   . Anemia     Past Surgical History  Procedure Laterality Date  . A flutter ablation    . Knee arthroscopy    . Tubal ligation      Family History  Problem Relation Age of Onset  . Stroke Father 23    died from stroke at age 62  . Stroke Mother 16    died from stroke at age 54  . Colon cancer Brother    . Heart disease Mother     History   Social History  . Marital Status: Married    Spouse Name: N/A    Number of Children: 1  . Years of Education: N/A   Occupational History  . Retired    Social History Main Topics  . Smoking status: Never Smoker   . Smokeless tobacco: Never Used  . Alcohol Use: No  . Drug Use: No  . Sexual Activity: Not on file   Other Topics Concern  . Not on file   Social History Narrative   Occ caffeine     Review of Systems:  All systems reviewed.  They are negative to the above problem except as previously stated.  Vital Signs: BP 130/74  Pulse 60  Ht 5\' 7"  (1.702 m)  Wt 141 lb (63.957 kg)  BMI 22.08 kg/m2  Physical Exam Patient is in NAD HEENT:  Normocephalic, atraumatic. EOMI, PERRLA.  Neck: JVP is normal.  No bruits.  Lungs: clear to auscultation. No rales no wheezes.  Heart:  Regular rate and rhythm. Normal S1, S2. No S3.   No significant murmurs. PMI not displaced.  Abdomen:  Supple, nontender. Normal bowel sounds. No masses. No hepatomegaly.  Extremities:   Good distal pulses throughout. No lower extremity edema.  Musculoskeletal :moving all extremities.  Neuro:   alert and oriented x3.  CN II-XII grossly intact.  EKG;  SR   60 bpm.  RBBB.  Assessment and Plan:  1.  Arrhythmia  Patient doing well  No signif palpitaitons.  Check labs today  I have told her she needs to take Xarelto daily  2.  HTN  BP is good  Keep on same regimen

## 2013-05-03 NOTE — Patient Instructions (Signed)
Your physician recommends that you continue on your current medications as directed. Please refer to the Current Medication list given to you today.  Your physician recommends that you return for lab work in: TODAY (BMET, CBC)  Your physician wants you to follow-up in: [place].  You will receive a reminder letter in the mail two months in advance. If you don't receive a letter, please call our office to schedule the follow-up appointment.

## 2013-05-06 ENCOUNTER — Telehealth: Payer: Self-pay | Admitting: Internal Medicine

## 2013-05-06 NOTE — Telephone Encounter (Signed)
Spoke with pt, aware of lab results. 

## 2013-05-06 NOTE — Telephone Encounter (Signed)
New message     rtn call back to nurse.

## 2013-05-26 ENCOUNTER — Other Ambulatory Visit: Payer: Self-pay | Admitting: *Deleted

## 2013-05-26 MED ORDER — DILTIAZEM HCL ER COATED BEADS 240 MG PO CP24: ORAL_CAPSULE | ORAL | Status: DC

## 2013-07-08 ENCOUNTER — Other Ambulatory Visit: Payer: Self-pay | Admitting: Internal Medicine

## 2013-08-11 ENCOUNTER — Other Ambulatory Visit: Payer: Self-pay | Admitting: Internal Medicine

## 2013-08-17 ENCOUNTER — Encounter: Payer: Self-pay | Admitting: Internal Medicine

## 2013-09-07 ENCOUNTER — Other Ambulatory Visit: Payer: Self-pay | Admitting: Physician Assistant

## 2013-09-07 DIAGNOSIS — D229 Melanocytic nevi, unspecified: Secondary | ICD-10-CM

## 2013-09-07 HISTORY — DX: Melanocytic nevi, unspecified: D22.9

## 2013-09-30 ENCOUNTER — Other Ambulatory Visit: Payer: Self-pay | Admitting: Physician Assistant

## 2013-10-01 ENCOUNTER — Other Ambulatory Visit: Payer: Self-pay

## 2013-10-01 MED ORDER — ATENOLOL 25 MG PO TABS
25.0000 mg | ORAL_TABLET | ORAL | Status: DC | PRN
Start: 2013-10-01 — End: 2014-12-29

## 2013-11-19 ENCOUNTER — Ambulatory Visit (INDEPENDENT_AMBULATORY_CARE_PROVIDER_SITE_OTHER): Payer: Medicare Other | Admitting: Internal Medicine

## 2013-11-19 ENCOUNTER — Encounter: Payer: Self-pay | Admitting: Internal Medicine

## 2013-11-19 VITALS — BP 146/76 | HR 68 | Ht 67.0 in | Wt 139.8 lb

## 2013-11-19 DIAGNOSIS — I491 Atrial premature depolarization: Secondary | ICD-10-CM

## 2013-11-19 NOTE — Progress Notes (Addendum)
HPI Patient is a 78 year old with a hsitory of atrial flutter (s/p ablation), afib and HTN.  Since I saw her last, she has done well  No palpitations.  No SOB   Taking Xarelto every other day.  Concerned about bleeding  Says she does not feel she is having afib   No Known Allergies  Current Outpatient Prescriptions  Medication Sig Dispense Refill  . alendronate (FOSAMAX) 70 MG tablet Take 70 mg by mouth every 7 (seven) days. Take with a full glass of water on an empty stomach.       Marland Kitchen atenolol (TENORMIN) 25 MG tablet Take 1 tablet (25 mg total) by mouth as needed. Pt takes if she needs it PRN  30 tablet  3  . Calcium Carbonate-Vitamin D (CALCIUM + D PO) 1 tab po qd       . diltiazem (CARDIZEM CD) 240 MG 24 hr capsule TAKE ONE CAPSULE BY MOUTH EVERY DAY  90 capsule  1  . flecainide (TAMBOCOR) 50 MG tablet TAKE 1 TABLET BY MOUTH TWO TIMES DAILY  180 tablet  3  . levothyroxine (SYNTHROID) 50 MCG tablet Take 50 mcg by mouth daily.        . meloxicam (MOBIC) 15 MG tablet Take 15 mg by mouth as needed.        . Multiple Vitamin (MULTIVITAMIN PO) Take by mouth. Multivitamin for bones       . XARELTO 15 MG TABS tablet TAKE 1 TABLET EVERY DAY WITH SUPPER  30 tablet  3  . [DISCONTINUED] estrogen, conjugated,-medroxyprogesterone (PREMPRO) 0.3-1.5 MG per tablet Take 1 tablet by mouth 2 (two) times a week.       . [DISCONTINUED] warfarin (COUMADIN) 5 MG tablet Take as directed by Anticoagulation clinic  35 tablet  3   No current facility-administered medications for this visit.    Past Medical History  Diagnosis Date  . Atrial flutter     s/p ablation  . Palpitations   . HTN (hypertension)   . Arthritis   . Anemia     Past Surgical History  Procedure Laterality Date  . A flutter ablation    . Knee arthroscopy    . Tubal ligation      Family History  Problem Relation Age of Onset  . Stroke Father 76    died from stroke at age 26  . Stroke Mother 38    died from stroke at age 64  .  Colon cancer Brother   . Heart disease Mother     History   Social History  . Marital Status: Married    Spouse Name: N/A    Number of Children: 1  . Years of Education: N/A   Occupational History  . Retired    Social History Main Topics  . Smoking status: Never Smoker   . Smokeless tobacco: Never Used  . Alcohol Use: No  . Drug Use: No  . Sexual Activity: Not on file   Other Topics Concern  . Not on file   Social History Narrative   Occ caffeine     Review of Systems:  All systems reviewed.  They are negative to the above problem except as previously stated.  Vital Signs: BP 146/76  Pulse 68  Ht 5\' 7"  (1.702 m)  Wt 139 lb 12.8 oz (63.413 kg)  BMI 21.89 kg/m2 BP on recheck 188/86 Physical Exam Patient is in NAD HEENT:  Normocephalic, atraumatic. EOMI, PERRLA.  Neck: JVP is normal.  No bruits.  Lungs: clear to auscultation. No rales no wheezes.  Heart: Regular rate and rhythm. Normal S1, S2. No S3.   No significant murmurs. PMI not displaced.  Abdomen:  Supple, nontender. Normal bowel sounds. No masses. No hepatomegaly.  Extremities:   Good distal pulses throughout. No lower extremity edema.  Musculoskeletal :moving all extremities.  Neuro:   alert and oriented x3.  CN II-XII grossly intact.  EKG;  SR   68 bpm.  RBBB.  Assessment and Plan:  1.  Arrhythmia  Patient denies palpitations.  EKG without ectopy  Still, concerned about asymtomatic arrhythmia Will review with EP  Discussed risk of CVA on subtherapeutic anticoag.  2.  HTN  BP on initial check fair  Second check high.  They have a new cuff  Need to have calibrated and follow

## 2013-11-22 ENCOUNTER — Other Ambulatory Visit: Payer: Self-pay | Admitting: *Deleted

## 2013-11-22 MED ORDER — DILTIAZEM HCL ER COATED BEADS 240 MG PO CP24: ORAL_CAPSULE | ORAL | Status: DC

## 2013-11-26 ENCOUNTER — Telehealth: Payer: Self-pay | Admitting: Internal Medicine

## 2013-11-26 NOTE — Telephone Encounter (Signed)
New message     Talk to the nurse about ordering lab work and having her PCP draw it

## 2013-11-29 NOTE — Telephone Encounter (Signed)
Patient will not be seeing PCP until 12/15, her labs done that day will be forwarded to Dr. Harrington Challenger.  She wanted to make sure it was ok to wait until then. Told her that would be fine, if Dr. Harrington Challenger wanted labs sooner we will contact her. Pt verbalizes understanding.

## 2013-12-29 ENCOUNTER — Encounter: Payer: Self-pay | Admitting: Internal Medicine

## 2014-04-28 ENCOUNTER — Other Ambulatory Visit: Payer: Self-pay | Admitting: Internal Medicine

## 2014-05-30 ENCOUNTER — Encounter: Payer: Self-pay | Admitting: Internal Medicine

## 2014-05-30 ENCOUNTER — Ambulatory Visit (INDEPENDENT_AMBULATORY_CARE_PROVIDER_SITE_OTHER): Payer: Medicare Other | Admitting: Internal Medicine

## 2014-05-30 VITALS — BP 160/70 | HR 70 | Ht 68.0 in | Wt 139.1 lb

## 2014-05-30 DIAGNOSIS — E039 Hypothyroidism, unspecified: Secondary | ICD-10-CM

## 2014-05-30 DIAGNOSIS — E785 Hyperlipidemia, unspecified: Secondary | ICD-10-CM

## 2014-05-30 DIAGNOSIS — I10 Essential (primary) hypertension: Secondary | ICD-10-CM | POA: Diagnosis not present

## 2014-05-30 LAB — BASIC METABOLIC PANEL
BUN: 22 mg/dL (ref 6–23)
CHLORIDE: 104 meq/L (ref 96–112)
CO2: 29 meq/L (ref 19–32)
CREATININE: 1.31 mg/dL — AB (ref 0.40–1.20)
Calcium: 10 mg/dL (ref 8.4–10.5)
GFR: 41.86 mL/min — ABNORMAL LOW (ref >60.00)
Glucose, Bld: 93 mg/dL (ref 70–99)
Potassium: 4.4 mEq/L (ref 3.5–5.1)
Sodium: 138 mEq/L (ref 135–145)

## 2014-05-30 LAB — LIPID PANEL
Cholesterol: 204 mg/dL — ABNORMAL HIGH (ref 0–200)
HDL: 102.7 mg/dL (ref >39.00)
LDL CALC: 92 mg/dL (ref 0–99)
NonHDL: 101.3
TRIGLYCERIDES: 48 mg/dL (ref 0.0–149.0)
Total CHOL/HDL Ratio: 2
VLDL: 9.6 mg/dL (ref 0.0–40.0)

## 2014-05-30 LAB — TSH: TSH: 2.57 u[IU]/mL (ref 0.35–4.50)

## 2014-05-30 LAB — CBC
HCT: 35.3 % — ABNORMAL LOW (ref 36.0–46.0)
Hemoglobin: 11.9 g/dL — ABNORMAL LOW (ref 12.0–15.0)
MCHC: 33.8 g/dL (ref 30.0–36.0)
MCV: 90.8 fl (ref 78.0–100.0)
PLATELETS: 212 10*3/uL (ref 150.0–400.0)
RBC: 3.89 Mil/uL (ref 3.87–5.11)
RDW: 13.5 % (ref 11.5–15.5)
WBC: 6 10*3/uL (ref 4.0–10.5)

## 2014-05-30 NOTE — Progress Notes (Signed)
Cardiology Office Note   Date:  05/30/2014   ID:  SHANTANIQUE HODO, DOB Dec 28, 1935, MRN [id number]  PCP:  Octavio Graves, DO  Cardiologist:   Dorris Carnes, MD   No chief complaint on file.     History of Present Illness: Amy [name] is a 79 y.o. female with a history of atrial flutter (s/p ablation), afib, HTN   I saw her in October    Since seen she deneis palpitations  No SOB  No dizziness Takes bp at home  Says it is good  Did not bring cuff     Current Outpatient Prescriptions  Medication Sig Dispense Refill  . alendronate (FOSAMAX) 70 MG tablet Take 70 mg by mouth every 7 (seven) days. Take with a full glass of water on an empty stomach.     . ALPHAGAN P 0.1 % SOLN Place 1 drop into the left eye 3 (three) times daily.   4  . atenolol (TENORMIN) 25 MG tablet Take 1 tablet (25 mg total) by mouth as needed. Pt takes if she needs it PRN 30 tablet 3  . Calcium Carbonate-Vitamin D (CALCIUM + D PO) 1 tab po qd     . diltiazem (CARDIZEM CD) 240 MG 24 hr capsule TAKE ONE CAPSULE BY MOUTH EVERY DAY 90 capsule 3  . DUREZOL 0.05 % EMUL   1  . flecainide (TAMBOCOR) 50 MG tablet TAKE 1 TABLET BY MOUTH TWO TIMES DAILY 180 tablet 3  . ketorolac (ACULAR) 0.5 % ophthalmic solution Place 1 drop into the right eye 3 (three) times daily.   1  . levothyroxine (SYNTHROID) 50 MCG tablet Take 50 mcg by mouth daily.      . meloxicam (MOBIC) 15 MG tablet Take 15 mg by mouth as needed.      . Multiple Vitamin (MULTIVITAMIN PO) Take by mouth. Multivitamin for bones     . ofloxacin (OCUFLOX) 0.3 % ophthalmic solution Place 1 drop into the right eye 3 (three) times daily.   1  . XARELTO 15 MG TABS tablet TAKE 1 TABLET BY MOUTH EVERY DAY WITH SUPPER 30 tablet 1  . [DISCONTINUED] estrogen, conjugated,-medroxyprogesterone (PREMPRO) 0.3-1.5 MG per tablet Take 1 tablet by mouth 2 (two) times a week.     . [DISCONTINUED] warfarin (COUMADIN) 5 MG tablet Take as directed by Anticoagulation clinic 35 tablet 3     No current facility-administered medications for this visit.    Allergies:   Review of patient's allergies indicates no known allergies.   Past Medical History  Diagnosis Date  . Atrial flutter     s/p ablation  . Palpitations   . HTN (hypertension)   . Arthritis   . Anemia     Past Surgical History  Procedure Laterality Date  . A flutter ablation    . Knee arthroscopy    . Tubal ligation       Social History:  The patient  reports that she has never smoked. She has never used smokeless tobacco. She reports that she does not drink alcohol or use illicit drugs.   Family History:  The patient's family history includes Colon cancer in her brother; Heart disease in her mother; Stroke (age of onset: 31) in her mother; Stroke (age of onset: 26) in her father.    ROS:  Please see the history of present illness. All other systems are reviewed and  Negative to the above problem except as noted.    PHYSICAL EXAM: VS:  BP 160/70 mmHg  Pulse 70  Ht 5\' 8"  (1.727 m)  Wt 139 lb 1.9 oz (63.104 kg)  BMI 21.16 kg/m2  GEN: Well nourished, well developed, in no acute distress HEENT: normal Neck: no JVD, carotid bruits, or masses Cardiac: RRR; no murmurs, rubs, or gallops,no edema  Respiratory:  clear to auscultation bilaterally, normal work of breathing GI: soft, nontender, nondistended, + BS  No hepatomegaly  MS: no deformity Moving all extremities   Skin: warm and dry, no rash Neuro:  Strength and sensation are intact Psych: euthymic mood, full affect   EKG:  EKG is not  ordered today.     Lipid Panel    Component Value Date/Time   CHOL 208* 11/15/2008 0924   TRIG 50.0 11/15/2008 0924   HDL 100.50 11/15/2008 0924   CHOLHDL 2 11/15/2008 0924   VLDL 10.0 11/15/2008 0924   LDLDIRECT 84.9 11/15/2008 0924      Wt Readings from Last 3 Encounters:  05/30/14 139 lb 1.9 oz (63.104 kg)  11/19/13 139 lb 12.8 oz (63.413 kg)  05/03/13 141 lb (63.957 kg)      ASSESSMENT AND  PLAN: 1  PAF  Denies palpitations  Still taking Xarelto qod  I have discouraged  WIll check CBC and BMET today  2. HCM  Check TSH and lipds    3  HTN  BP is high today  She says it is always up in doctors office   Bring cuff to IM clinic to calibrate  WIl lcall for numbers    Signed, Dorris Carnes, MD  05/30/2014 9:30 AM    [facility] [place], [place], [place]  [address] Phone: [phone]; Fax: [phone]

## 2014-05-30 NOTE — Patient Instructions (Signed)
Medication Instructions:  Your physician recommends that you continue on your current medications as directed. Please refer to the Current Medication list given to you today.   Labwork: Your physician recommends that you return for lab work TODAY (BMET, CBC, LIPIDS, TSH)   Testing/Procedures:   Follow-Up: Your physician wants you to follow-up in: [place].  You will receive a reminder letter in the mail two months in advance. If you don't receive a letter, please call our office to schedule the follow-up appointment. A  Any Other Special Instructions Will Be Listed Below (If Applicable).

## 2014-07-18 ENCOUNTER — Other Ambulatory Visit: Payer: Self-pay

## 2014-08-19 ENCOUNTER — Other Ambulatory Visit: Payer: Self-pay | Admitting: Internal Medicine

## 2014-08-21 ENCOUNTER — Other Ambulatory Visit: Payer: Self-pay | Admitting: Internal Medicine

## 2014-11-03 ENCOUNTER — Other Ambulatory Visit: Payer: Self-pay | Admitting: Internal Medicine

## 2014-11-15 ENCOUNTER — Other Ambulatory Visit: Payer: Self-pay | Admitting: Internal Medicine

## 2014-12-02 ENCOUNTER — Encounter: Payer: Self-pay | Admitting: Internal Medicine

## 2014-12-02 ENCOUNTER — Ambulatory Visit (INDEPENDENT_AMBULATORY_CARE_PROVIDER_SITE_OTHER): Payer: Medicare Other | Admitting: Internal Medicine

## 2014-12-02 VITALS — BP 146/70 | HR 58 | Ht 68.0 in | Wt 136.8 lb

## 2014-12-02 DIAGNOSIS — I4891 Unspecified atrial fibrillation: Secondary | ICD-10-CM | POA: Diagnosis not present

## 2014-12-02 LAB — CBC
HCT: 38.1 % (ref 36.0–46.0)
Hemoglobin: 12.4 g/dL (ref 12.0–15.0)
MCH: 30.5 pg (ref 26.0–34.0)
MCHC: 32.5 g/dL (ref 30.0–36.0)
MCV: 93.6 fL (ref 78.0–100.0)
MPV: 10.8 fL (ref 8.6–12.4)
Platelets: 294 10*3/uL (ref 150–400)
RBC: 4.07 MIL/uL (ref 3.87–5.11)
RDW: 13.3 % (ref 11.5–15.5)
WBC: 8 10*3/uL (ref 4.0–10.5)

## 2014-12-02 LAB — BASIC METABOLIC PANEL WITH GFR
BUN: 15 mg/dL (ref 7–25)
CO2: 24 mmol/L (ref 20–31)
Calcium: 9.7 mg/dL (ref 8.6–10.4)
Chloride: 104 mmol/L (ref 98–110)
Creat: 1.32 mg/dL — ABNORMAL HIGH (ref 0.60–0.93)
Glucose, Bld: 93 mg/dL (ref 65–99)
Potassium: 4.6 mmol/L (ref 3.5–5.3)
Sodium: 139 mmol/L (ref 135–146)

## 2014-12-02 NOTE — Patient Instructions (Signed)
Medication Instructions:   NO CHANGE  Labwork:  Your physician recommends that you HAVE LAB WORK TODAY  Follow-Up:  Your physician wants you to follow-up in: 6 MONTHS WITH DR ROSS You will receive a reminder letter in the mail two months in advance. If you don't receive a letter, please call our office to schedule the follow-up appointment.   If you need a refill on your cardiac medications before your next appointment, please call your pharmacy.    

## 2014-12-02 NOTE — Progress Notes (Addendum)
Cardiology Office Note   Date:  12/02/2014   ID:  Amy [name], DOB June 24, 1935, MRN [id number]  PCP:  Octavio Graves, DO  Cardiologist:   Dorris Carnes, MD   Chief Complaint  Patient presents with  . Atrial Fibrillation      History of Present Illness: Amy [name] is a 79 y.o. female with a history of atrial flutter (s/p ablation), afib, HTN   I saw her in May 2016    Since seen she has done well  Denies palipitations  Breathing is OK  No CP         Current Outpatient Prescriptions  Medication Sig Dispense Refill  . alendronate (FOSAMAX) 70 MG tablet Take 70 mg by mouth every 7 (seven) days. Take with a full glass of water on an empty stomach.     Marland Kitchen atenolol (TENORMIN) 25 MG tablet Take 1 tablet (25 mg total) by mouth as needed. Pt takes if she needs it PRN 30 tablet 3  . Calcium Carbonate-Vitamin D (CALCIUM + D PO) 1 tab po qd     . diltiazem (CARDIZEM CD) 240 MG 24 hr capsule TAKE ONE CAPSULE BY MOUTH EVERY DAY 90 capsule 0  . DUREZOL 0.05 % EMUL   1  . flecainide (TAMBOCOR) 50 MG tablet TAKE 1 TABLET BY MOUTH TWO TIMES DAILY 180 tablet 1  . ketorolac (ACULAR) 0.5 % ophthalmic solution Place 1 drop into the right eye 3 (three) times daily.   1  . latanoprost (XALATAN) 0.005 % ophthalmic solution Place 1 drop into both eyes daily.  4  . levothyroxine (SYNTHROID) 50 MCG tablet Take 50 mcg by mouth daily.      . meloxicam (MOBIC) 15 MG tablet Take 15 mg by mouth as needed.      . Multiple Vitamin (MULTIVITAMIN PO) Take by mouth. Multivitamin for bones     . XARELTO 15 MG TABS tablet TAKE 1 TABLET BY MOUTH EVERY DAY WITH SUPPER 30 tablet 1  . [DISCONTINUED] estrogen, conjugated,-medroxyprogesterone (PREMPRO) 0.3-1.5 MG per tablet Take 1 tablet by mouth 2 (two) times a week.     . [DISCONTINUED] warfarin (COUMADIN) 5 MG tablet Take as directed by Anticoagulation clinic 35 tablet 3   No current facility-administered medications for this visit.    Allergies:   Review of  patient's allergies indicates no known allergies.   Past Medical History  Diagnosis Date  . Atrial flutter ([place])     s/p ablation  . Palpitations   . HTN (hypertension)   . Arthritis   . Anemia     Past Surgical History  Procedure Laterality Date  . A flutter ablation    . Knee arthroscopy    . Tubal ligation       Social History:  The patient  reports that she has never smoked. She has never used smokeless tobacco. She reports that she does not drink alcohol or use illicit drugs.   Family History:  The patient's family history includes Colon cancer in her brother; Heart disease in her mother; Stroke (age of onset: 12) in her mother; Stroke (age of onset: 77) in her father.    ROS:  Please see the history of present illness. All other systems are reviewed and  Negative to the above problem except as noted.    PHYSICAL EXAM: VS:  BP 146/70 mmHg  Pulse 58  Ht 5\' 8"  (1.727 m)  Wt 62.052 kg (136 lb 12.8 oz)  BMI 20.81 kg/m2  GEN: Well nourished, well developed, in no acute distress HEENT: normal Neck: no JVD, carotid bruits, or masses Cardiac: RRR; no murmurs, rubs, or gallops,no edema  Respiratory:  clear to auscultation bilaterally, normal work of breathing GI: soft, nontender, nondistended, + BS  No hepatomegaly  MS: no deformity Moving all extremities   Skin: warm and dry, no rash Neuro:  Strength and sensation are intact Psych: euthymic mood, full affect   EKG:  EKG is ordered today.  SB 58 bpm  RBBB     Lipid Panel    Component Value Date/Time   CHOL 204* 05/30/2014 1009   TRIG 48.0 05/30/2014 1009   HDL 102.70 05/30/2014 1009   CHOLHDL 2 05/30/2014 1009   VLDL 9.6 05/30/2014 1009   LDLCALC 92 05/30/2014 1009   LDLDIRECT 84.9 11/15/2008 0924      Wt Readings from Last 3 Encounters:  12/02/14 62.052 kg (136 lb 12.8 oz)  05/30/14 63.104 kg (139 lb 1.9 oz)  11/19/13 63.413 kg (139 lb 12.8 oz)      ASSESSMENT AND PLAN:  1.  Rhythm.  Pt s/p atrial  flutter ablation  Als a history of atrial fib  Currently in SR   I would keep on current regime  Check labs.   Pt admits to taking xarlto every other day.  I reviewed that this is not adequate for anticoag  2.  Thyroid  Check TSH  F/U in 6 months      Signed, Dorris Carnes, MD  12/02/2014 11:53 AM    [facility], [place], [place]  [address] Phone: [phone]; Fax: [phone]

## 2014-12-03 LAB — TSH: TSH: 2.536 u[IU]/mL (ref 0.350–4.500)

## 2014-12-29 ENCOUNTER — Other Ambulatory Visit: Payer: Self-pay | Admitting: *Deleted

## 2014-12-29 ENCOUNTER — Telehealth: Payer: Self-pay | Admitting: Internal Medicine

## 2014-12-29 MED ORDER — ATENOLOL 25 MG PO TABS: 25.0000 mg | ORAL_TABLET | Freq: Every day | ORAL | Status: DC | PRN

## 2014-12-29 NOTE — Telephone Encounter (Signed)
°*  STAT* If patient is at the pharmacy, call can be transferred to refill team.  1. Which medications need to be refilled? (please list name of each medication and dose if known) atenolol 25mg    2. Which pharmacy/location (including street and city if local pharmacy) is medication to be sent to?[facility]  3. Do they need a 30 day or 90 day supply? [place]

## 2014-12-29 NOTE — Telephone Encounter (Signed)
Rx sent in

## 2015-02-15 ENCOUNTER — Other Ambulatory Visit: Payer: Self-pay | Admitting: Internal Medicine

## 2015-02-15 NOTE — Telephone Encounter (Signed)
Fay Records, MD at 12/02/2014 11:53 AM  flecainide (TAMBOCOR) 50 MG tabletTAKE 1 TABLET BY MOUTH TWO TIMES DAILY diltiazem (CARDIZEM CD) 240 MG 24 hr capsule TAKE ONE CAPSULE BY MOUTH EVERY DAY ASSESSMENT AND PLAN:  1. Rhythm. Pt s/p atrial flutter ablation Als a history of atrial fib Currently in SR  I would keep on current regime Check labs. Pt admits to taking xarlto every other day. I reviewed that this is not adequate for anticoag  Patient Instructions     Medication Instructions:   NO CHANGE

## 2015-04-11 ENCOUNTER — Other Ambulatory Visit: Payer: Self-pay | Admitting: Internal Medicine

## 2015-05-09 ENCOUNTER — Other Ambulatory Visit: Payer: Self-pay | Admitting: Internal Medicine

## 2015-05-13 ENCOUNTER — Other Ambulatory Visit: Payer: Self-pay | Admitting: Internal Medicine

## 2015-06-05 ENCOUNTER — Ambulatory Visit (INDEPENDENT_AMBULATORY_CARE_PROVIDER_SITE_OTHER): Payer: Medicare Other | Admitting: Internal Medicine

## 2015-06-05 VITALS — BP 177/76 | HR 66 | Ht 68.0 in | Wt 134.8 lb

## 2015-06-05 DIAGNOSIS — I10 Essential (primary) hypertension: Secondary | ICD-10-CM

## 2015-06-05 LAB — CBC
HEMATOCRIT: 36.8 % (ref 35.0–45.0)
Hemoglobin: 12.1 g/dL (ref 11.7–15.5)
MCH: 30.6 pg (ref 27.0–33.0)
MCHC: 32.9 g/dL (ref 32.0–36.0)
MCV: 92.9 fL (ref 80.0–100.0)
MPV: 10.3 fL (ref 7.5–12.5)
PLATELETS: 222 10*3/uL (ref 140–400)
RBC: 3.96 MIL/uL (ref 3.80–5.10)
RDW: 13 % (ref 11.0–15.0)
WBC: 6 10*3/uL (ref 3.8–10.8)

## 2015-06-05 LAB — LIPID PANEL
CHOLESTEROL: 218 mg/dL — AB (ref 125–200)
HDL: 109 mg/dL (ref >=46)
LDL Cholesterol: 99 mg/dL (ref <130)
TRIGLYCERIDES: 50 mg/dL (ref <150)
Total CHOL/HDL Ratio: 2 Ratio (ref <=5.0)
VLDL: 10 mg/dL (ref <30)

## 2015-06-05 LAB — BASIC METABOLIC PANEL
BUN: 24 mg/dL (ref 7–25)
CHLORIDE: 106 mmol/L (ref 98–110)
CO2: 25 mmol/L (ref 20–31)
CREATININE: 1.31 mg/dL — AB (ref 0.60–0.93)
Calcium: 9.7 mg/dL (ref 8.6–10.4)
Glucose, Bld: 93 mg/dL (ref 65–99)
Potassium: 4.3 mmol/L (ref 3.5–5.3)
SODIUM: 140 mmol/L (ref 135–146)

## 2015-06-05 LAB — TSH: TSH: 2.42 mIU/L

## 2015-06-05 NOTE — Progress Notes (Addendum)
Cardiology Office Note   Date:  06/05/2015   ID:  Amy [name], DOB 1935/05/17, MRN [id number]  PCP:  Octavio Graves, DO  Cardiologist:   Dorris Carnes, MD   F/U of PAF      History of Present Illness: Amy [name] is a 80 y.o. female with a history of atrial flutter (s/p ablation) , atrial fib, HTN  I saw her in November 2016 SInce seen she deneis palpitations  Breathing is OK Notes BP at home mainly in 140s  Occasional 160, 170   Denies CP     Outpatient Prescriptions Prior to Visit  Medication Sig Dispense Refill  . alendronate (FOSAMAX) 70 MG tablet Take 70 mg by mouth every 7 (seven) days. Take with a full glass of water on an empty stomach.     Marland Kitchen atenolol (TENORMIN) 25 MG tablet Take 1 tablet (25 mg total) by mouth daily as needed. 30 tablet 11  . Calcium Carbonate-Vitamin D (CALCIUM + D PO) 1 tab po qd     . diltiazem (CARDIZEM CD) 240 MG 24 hr capsule TAKE ONE CAPSULE BY MOUTH EVERY DAY 90 capsule 1  . flecainide (TAMBOCOR) 50 MG tablet TAKE 1 TABLET BY MOUTH TWO TIMES DAILY 180 tablet 1  . latanoprost (XALATAN) 0.005 % ophthalmic solution Place 1 drop into both eyes daily.  4  . levothyroxine (SYNTHROID) 50 MCG tablet Take 50 mcg by mouth daily.      . meloxicam (MOBIC) 15 MG tablet Take 15 mg by mouth as needed.      . Multiple Vitamin (MULTIVITAMIN PO) Take by mouth. Multivitamin for bones     . XARELTO 15 MG TABS tablet TAKE 1 TABLET BY MOUTH EVERY DAY WITH SUPPER 30 tablet 6  . DUREZOL 0.05 % EMUL Reported on 06/05/2015  1  . ketorolac (ACULAR) 0.5 % ophthalmic solution Place 1 drop into the right eye 3 (three) times daily. Reported on 06/05/2015  1   No facility-administered medications prior to visit.     Allergies:   Review of patient's allergies indicates no known allergies.   Past Medical History  Diagnosis Date  . Atrial flutter ([place])     s/p ablation  . Palpitations   . HTN (hypertension)   . Arthritis   . Anemia     Past Surgical History    Procedure Laterality Date  . A flutter ablation    . Knee arthroscopy    . Tubal ligation       Social History:  The patient  reports that she has never smoked. She has never used smokeless tobacco. She reports that she does not drink alcohol or use illicit drugs.   Family History:  The patient's family history includes Colon cancer in her brother; Heart disease in her mother; Stroke (age of onset: 72) in her mother; Stroke (age of onset: 58) in her father.    ROS:  Please see the history of present illness. All other systems are reviewed and  Negative to the above problem except as noted.    PHYSICAL EXAM: VS:  BP 177/76 mmHg  Pulse 66  Ht 5\' 8"  (1.727 m)  Wt 134 lb 12.8 oz (61.145 kg)  BMI 20.50 kg/m2  GEN: Well nourished, well developed, in no acute distress HEENT: normal Neck: no JVD, carotid bruits, or masses Cardiac: RRR; no murmurs, rubs, or gallops,no edema  Respiratory:  clear to auscultation bilaterally, normal work of breathing GI: soft, nontender, nondistended, + BS  No hepatomegaly  MS: no deformity Moving all extremities   Skin: warm and dry, no rash Neuro:  Strength and sensation are intact Psych: euthymic mood, full affect   EKG:  EKG is not ordered today.   Lipid Panel    Component Value Date/Time   CHOL 204* 05/30/2014 1009   TRIG 48.0 05/30/2014 1009   HDL 102.70 05/30/2014 1009   CHOLHDL 2 05/30/2014 1009   VLDL 9.6 05/30/2014 1009   LDLCALC 92 05/30/2014 1009   LDLDIRECT 84.9 11/15/2008 0924      Wt Readings from Last 3 Encounters:  06/05/15 134 lb 12.8 oz (61.145 kg)  12/02/14 136 lb 12.8 oz (62.052 kg)  05/30/14 139 lb 1.9 oz (63.104 kg)      ASSESSMENT AND PLAN:  1  HTN  BP is high today  She says it is usually in 130s to 140s  Occasa 150/160  I have asked her to take fairly regularly at home  Keep log  Bring cuff to next visit  If running in 150s or higher needs to call bak  2.  Rhythm  Denies palpitations   Keep on same regimen   SHe is a little erratic on anticoag   Reviewed   Will get labs today     Disposition:   FU with me in 6 months    Signed, Dorris Carnes, MD  06/05/2015 9:14 AM    [facility], [place], [place]  [address] Phone: [phone]; Fax: [phone]

## 2015-06-05 NOTE — Patient Instructions (Signed)
Your physician recommends that you continue on your current medications as directed. Please refer to the Current Medication list given to you today. Your physician recommends that you return for lab work in: today (cbc, bmet, lipids, tsh)  Your physician wants you to follow-up in: 6 months with Dr. Harrington Challenger.  You will receive a reminder letter in the mail two months in advance. If you don't receive a letter, please call our office to schedule the follow-up appointment.

## 2015-06-12 ENCOUNTER — Telehealth: Payer: Self-pay | Admitting: Internal Medicine

## 2015-06-12 NOTE — Telephone Encounter (Signed)
Patient informed of results. Verbalizes understanding. She has been checking BP three times daily, its been in normal range every time.  [place] She will call if BP gets high/remains.

## 2015-06-12 NOTE — Telephone Encounter (Signed)
Called and got voice mail. Advised that I will call back later today.

## 2015-06-12 NOTE — Telephone Encounter (Signed)
New Message:  Pt is calling in to get the results to her lab tests that was done on 5/15. Please f/u with the pt.

## 2015-11-06 ENCOUNTER — Other Ambulatory Visit: Payer: Self-pay | Admitting: Internal Medicine

## 2015-11-18 ENCOUNTER — Other Ambulatory Visit: Payer: Self-pay | Admitting: Internal Medicine

## 2015-11-27 ENCOUNTER — Other Ambulatory Visit: Payer: Self-pay | Admitting: Internal Medicine

## 2015-12-29 ENCOUNTER — Encounter: Payer: Self-pay | Admitting: Internal Medicine

## 2016-01-04 ENCOUNTER — Encounter: Payer: Self-pay | Admitting: Internal Medicine

## 2016-01-04 ENCOUNTER — Ambulatory Visit (INDEPENDENT_AMBULATORY_CARE_PROVIDER_SITE_OTHER): Payer: Medicare Other | Admitting: Internal Medicine

## 2016-01-04 VITALS — BP 124/70 | HR 60 | Ht 67.0 in | Wt 134.2 lb

## 2016-01-04 DIAGNOSIS — I4891 Unspecified atrial fibrillation: Secondary | ICD-10-CM | POA: Diagnosis not present

## 2016-01-04 DIAGNOSIS — Z8679 Personal history of other diseases of the circulatory system: Secondary | ICD-10-CM

## 2016-01-04 DIAGNOSIS — I10 Essential (primary) hypertension: Secondary | ICD-10-CM | POA: Diagnosis not present

## 2016-01-04 LAB — CBC
HCT: 35.7 % (ref 35.0–45.0)
HEMOGLOBIN: 11.7 g/dL (ref 11.7–15.5)
MCH: 31 pg (ref 27.0–33.0)
MCHC: 32.8 g/dL (ref 32.0–36.0)
MCV: 94.7 fL (ref 80.0–100.0)
MPV: 10.2 fL (ref 7.5–12.5)
Platelets: 233 10*3/uL (ref 140–400)
RBC: 3.77 MIL/uL — AB (ref 3.80–5.10)
RDW: 13.3 % (ref 11.0–15.0)
WBC: 7.2 10*3/uL (ref 3.8–10.8)

## 2016-01-04 LAB — BASIC METABOLIC PANEL
BUN: 22 mg/dL (ref 7–25)
CHLORIDE: 108 mmol/L (ref 98–110)
CO2: 25 mmol/L (ref 20–31)
Calcium: 9.2 mg/dL (ref 8.6–10.4)
Creat: 1.5 mg/dL — ABNORMAL HIGH (ref 0.60–0.93)
Glucose, Bld: 81 mg/dL (ref 65–99)
Potassium: 4.7 mmol/L (ref 3.5–5.3)
SODIUM: 140 mmol/L (ref 135–146)

## 2016-01-04 LAB — TSH: TSH: 2.52 mIU/L

## 2016-01-04 NOTE — Patient Instructions (Signed)
Your physician recommends that you continue on your current medications as directed. Please refer to the Current Medication list given to you today.  Your physician recommends that you return for lab work today (bmet, cbc, tsh)  Your physician wants you to follow-up in: 6 months with Dr. Harrington Challenger [facility]). You will receive a reminder letter in the mail two months in advance. If you don't receive a letter, please call our office to schedule the follow-up appointment.

## 2016-01-04 NOTE — Progress Notes (Signed)
Cardiology Office Note   Date:  01/04/2016   ID:  [name], DOB 1935-10-04, MRN [id number]  PCP:  [name], PA-C  Cardiologist:   Dorris Carnes, MD    F/U of HTN and PAF     History of Present Illness: Amy [name] is a 80 y.o. female with a history of atrial flutter (s/p ablation), PAF and HTN  I saw her in May 2017 Since seen she has done OK  No signif palpitations  No SOB   Brings in BP log from home and cuff    BPs are labile  107s to 175  Higher BP is often early am   Takes atenolol only if BP is high        Outpatient Medications Prior to Visit  Medication Sig Dispense Refill  . atenolol (TENORMIN) 25 MG tablet Take 1 tablet (25 mg total) by mouth daily as needed. 30 tablet 11  . Calcium Carbonate-Vitamin D (CALCIUM + D PO) 1 tab po qd     . diltiazem (CARDIZEM CD) 240 MG 24 hr capsule TAKE ONE CAPSULE BY MOUTH EVERY DAY 90 capsule 1  . flecainide (TAMBOCOR) 50 MG tablet TAKE 1 TABLET BY MOUTH TWO TIMES DAILY 180 tablet 1  . latanoprost (XALATAN) 0.005 % ophthalmic solution Place 1 drop into both eyes daily.  4  . levothyroxine (SYNTHROID) 50 MCG tablet Take 50 mcg by mouth daily.      . meloxicam (MOBIC) 15 MG tablet Take 15 mg by mouth as needed.      . Multiple Vitamin (MULTIVITAMIN PO) Take by mouth. Multivitamin for bones     . XARELTO 15 MG TABS tablet TAKE 1 TABLET BY MOUTH EVERY DAY WITH SUPPER 30 tablet 6  . alendronate (FOSAMAX) 70 MG tablet Take 70 mg by mouth every 7 (seven) days. Take with a full glass of water on an empty stomach.      No facility-administered medications prior to visit.      Allergies:   Patient has no known allergies.   Past Medical History:  Diagnosis Date  . Anemia   . Arthritis   . Atrial flutter ([place])    s/p ablation  . HTN (hypertension)   . Palpitations     Past Surgical History:  Procedure Laterality Date  . A FLUTTER ABLATION    . KNEE ARTHROSCOPY    . TUBAL LIGATION       Social History:  The  patient  reports that she has never smoked. She has never used smokeless tobacco. She reports that she does not drink alcohol or use drugs.   Family History:  The patient's family history includes Colon cancer in her brother; Heart disease in her mother; Stroke (age of onset: 75) in her mother; Stroke (age of onset: 49) in her father.    ROS:  Please see the history of present illness. All other systems are reviewed and  Negative to the above problem except as noted.    PHYSICAL EXAM: VS:  BP 124/70   Pulse 60   Ht 5\' 7"  (1.702 m)   Wt 134 lb 3.2 oz (60.9 kg)   LMP  (LMP Unknown)   BMI 21.02 kg/m      BP L arm Pt's cuff:  180/80  BP office cuff  180/70  \  GEN: Well nourished, well developed, in no acute distress  HEENT: normal  Neck: no JVD, carotid bruits, or masses Cardiac: RRR; no murmurs,  rubs, or gallops,no edema  Respiratory:  clear to auscultation bilaterally, normal work of breathing GI: soft, nontender, nondistended, + BS  No hepatomegaly  MS: no deformity Moving all extremities   Skin: warm and dry, no rash Neuro:  Strength and sensation are intact Psych: euthymic mood, full affect   EKG:  EKG is ordered today.   Lipid Panel    Component Value Date/Time   CHOL 218 (H) 06/05/2015 0946   TRIG 50 06/05/2015 0946   HDL 109 06/05/2015 0946   CHOLHDL 2.0 06/05/2015 0946   VLDL 10 06/05/2015 0946   LDLCALC 99 06/05/2015 0946   LDLDIRECT 84.9 11/15/2008 0924      Wt Readings from Last 3 Encounters:  01/04/16 134 lb 3.2 oz (60.9 kg)  06/05/15 134 lb 12.8 oz (61.1 kg)  12/02/14 136 lb 12.8 oz (62.1 kg)      ASSESSMENT AND PLAN:  1  HTN  Need to verify arms  Tech in office did R arm  Above reflect L WIll review labs  She probably should be on low dose of something to help steady BP  2  PAF  Deneis symptoms  Will check CBC and BMET     Current medicines are reviewed at length with the patient today.  The patient does not have concerns regarding  medicines.  Signed, Dorris Carnes, MD  01/04/2016 10:39 AM    [place] [place], [address], [place]  [address] Phone: [phone]; Fax: [phone]

## 2016-01-10 ENCOUNTER — Other Ambulatory Visit: Payer: Self-pay | Admitting: *Deleted

## 2016-01-10 MED ORDER — ATENOLOL 25 MG PO TABS: 25.0000 mg | ORAL_TABLET | Freq: Every day | ORAL | 11 refills | Status: DC

## 2016-01-10 NOTE — Progress Notes (Signed)
Notes Recorded by Fay Records, MD on 01/07/2016 at 11:16 PM EST REviewed labs with pt  Discussed taht she needs to get adequate fluids Also discussed bp I would recomm that she take b blocker regularlly Not PRN  Asked her to call back with BP response in a couple wks     Changed atenolol from PRN to daily.

## 2016-01-12 ENCOUNTER — Telehealth: Payer: Self-pay | Admitting: Internal Medicine

## 2016-01-12 NOTE — Telephone Encounter (Signed)
Patient wanted to just call and let Dr. Harrington Challenger know that she is feeling good. She wanted to report her BP's. Patient denies any dizziness, lightheadedness, chest pain or SOB. Patient's BP 146/67 HR 63 today. Highest 168/77 (only one day) and lowest 116/52 (mornings mostly). Informed patient that we would send message to Dr. Harrington Challenger and her nurse. Encouraged patient to call back if she has any other concerns or questions. Patient verbalized understanding.

## 2016-01-12 NOTE — Telephone Encounter (Signed)
PT CALL WITH MED UPDATE FROM STARTING NEW MED LAST FRIDAY-PLS CALL

## 2016-01-16 NOTE — Telephone Encounter (Signed)
Good   Keep on same meds Continue tokeep log  Call if runs high consistently

## 2016-01-17 NOTE — Telephone Encounter (Signed)
Spoke with patient and reviewed Dr. Harrington Challenger' advice.  I reviewed BP parameters with patient and she verbalized understanding and agreement to call back with abnormal BP readings.  She thanked me for the call.

## 2016-01-26 ENCOUNTER — Ambulatory Visit (INDEPENDENT_AMBULATORY_CARE_PROVIDER_SITE_OTHER): Payer: Medicare Other | Admitting: Physician Assistant

## 2016-01-26 ENCOUNTER — Encounter: Payer: Self-pay | Admitting: Physician Assistant

## 2016-01-26 VITALS — BP 139/54 | HR 58 | Temp 97.0°F | Ht 67.0 in | Wt 135.0 lb

## 2016-01-26 DIAGNOSIS — I491 Atrial premature depolarization: Secondary | ICD-10-CM | POA: Diagnosis not present

## 2016-01-26 DIAGNOSIS — Z8679 Personal history of other diseases of the circulatory system: Secondary | ICD-10-CM

## 2016-01-26 DIAGNOSIS — N3281 Overactive bladder: Secondary | ICD-10-CM | POA: Diagnosis not present

## 2016-01-26 DIAGNOSIS — E039 Hypothyroidism, unspecified: Secondary | ICD-10-CM | POA: Diagnosis not present

## 2016-01-26 DIAGNOSIS — I10 Essential (primary) hypertension: Secondary | ICD-10-CM

## 2016-01-26 DIAGNOSIS — I4891 Unspecified atrial fibrillation: Secondary | ICD-10-CM | POA: Diagnosis not present

## 2016-01-26 DIAGNOSIS — M150 Primary generalized (osteo)arthritis: Secondary | ICD-10-CM | POA: Diagnosis not present

## 2016-01-26 DIAGNOSIS — M159 Polyosteoarthritis, unspecified: Secondary | ICD-10-CM

## 2016-01-26 MED ORDER — MELOXICAM 15 MG PO TABS: 15.0000 mg | ORAL_TABLET | ORAL | 2 refills | Status: DC | PRN

## 2016-01-26 MED ORDER — OXYBUTYNIN CHLORIDE ER 5 MG PO TB24: 5.0000 mg | ORAL_TABLET | Freq: Every day | ORAL | 3 refills | Status: DC

## 2016-01-26 MED ORDER — LEVOTHYROXINE SODIUM 50 MCG PO TABS: 50.0000 ug | ORAL_TABLET | Freq: Every day | ORAL | 3 refills | Status: DC

## 2016-01-26 NOTE — Patient Instructions (Signed)
Overactive Bladder, Adult Introduction Overactive bladder is a group of urinary symptoms. With overactive bladder, you may suddenly feel the need to pass urine (urinate) right away. After feeling this sudden urge, you might also leak urine if you cannot get to the bathroom fast enough (urinary incontinence). These symptoms might interfere with your daily work or social activities. Overactive bladder symptoms may also wake you up at night. Overactive bladder affects the nerve signals between your bladder and your brain. Your bladder may get the signal to empty before it is full. Very sensitive muscles can also make your bladder squeeze too soon. What are the causes? Many things can cause an overactive bladder. Possible causes include:  Urinary tract infection.  Infection of nearby tissues, such as the prostate.  Prostate enlargement.  Being pregnant with twins or more (multiples).  Surgery on the uterus or urethra.  Bladder stones, inflammation, or tumors.  Drinking too much caffeine or alcohol.  Certain medicines, especially those that you take to help your body get rid of extra fluid (diuretics) by increasing urine production.  Muscle or nerve weakness, especially from:  A spinal cord injury.  Stroke.  Multiple sclerosis.  Parkinson disease.  Diabetes. This can cause a high urine volume that fills the bladder so quickly that the normal urge to urinate is triggered very strongly.  Constipation. A buildup of too much stool can put pressure on your bladder. What increases the risk? You may be at greater risk for overactive bladder if you:  Are an older adult.  Smoke.  Are going through menopause.  Have prostate problems.  Have a neurological disease, such as stroke, dementia, Parkinson disease, or multiple sclerosis (MS).  Eat or drink things that irritate the bladder. These include alcohol, spicy food, and caffeine.  Are overweight or obese. What are the signs or  symptoms? The signs and symptoms of an overactive bladder include:  Sudden, strong urges to urinate.  Leaking urine.  Urinating eight or more times per day.  Waking up to urinate two or more times per night. How is this diagnosed? Your health care provider may suspect overactive bladder based on your symptoms. The health care provider will do a physical exam and take your medical history. Blood or urine tests may also be done. For example, you might need to have a bladder function test to check how well you can hold your urine. You might also need to see a health care provider who specializes in the urinary tract (urologist). How is this treated? Treatment for overactive bladder depends on the cause of your condition and whether it is mild or severe. Certain treatments can be done in your health care provider's office or clinic. You can also make lifestyle changes at home. Options include: Behavioral Treatments  Biofeedback. A specialist uses sensors to help you become aware of your body's signals.  Keeping a daily log of when you need to urinate and what happens after the urge. This may help you manage your condition.  Bladder training. This helps you learn to control the urge to urinate by following a schedule that directs you to urinate at regular intervals (timed voiding). At first, you might have to wait a few minutes after feeling the urge. In time, you should be able to schedule bathroom visits an hour or more apart.  Kegel exercises. These are exercises to strengthen the pelvic floor muscles, which support the bladder. Toning these muscles can help you control urination, even if your bladder muscles   are overactive. A specialist will teach you how to do these exercises correctly. They require daily practice.  Weight loss. If you are obese or overweight, losing weight might relieve your symptoms of overactive bladder. Talk to your health care provider about losing weight and whether  there is a specific program or method that would work best for you.  Diet change. This might help if constipation is making your overactive bladder worse. Your health care provider or a dietitian can explain ways to change what you eat to ease constipation. You might also need to consume less alcohol and caffeine or drink other fluids at different times of the day.  Stopping smoking.  Wearing pads to absorb leakage while you wait for other treatments to take effect. Physical Treatments  Electrical stimulation. Electrodes send gentle pulses of electricity to strengthen the nerves or muscles that help to control the bladder. Sometimes, the electrodes are placed outside of the body. In other cases, they might be placed inside the body (implanted). This treatment can take several months to have an effect.  Supportive devices. Women may need a plastic device that fits into the vagina and supports the bladder (pessary). Medicines  Several medicines can help treat overactive bladder and are usually used along with other treatments. Some are injected into the muscles involved in urination. Others come in pill form. Your health care provider may prescribe:  Antispasmodics. These medicines block the signals that the nerves send to the bladder. This keeps the bladder from releasing urine at the wrong time.  Tricyclic antidepressants. These types of antidepressants also relax bladder muscles. Surgery  You may have a device implanted to help manage the nerve signals that indicate when you need to urinate.  You may have surgery to implant electrodes for electrical stimulation.  Sometimes, very severe cases of overactive bladder require surgery to change the shape of the bladder. Follow these instructions at home:  Take medicines only as directed by your health care provider.  Use any implants or a pessary as directed by your health care provider.  Make any diet or lifestyle changes that are  recommended by your health care provider. These might include:  Drinking less fluid or drinking at different times of the day. If you need to urinate often during the night, you may need to stop drinking fluids early in the evening.  Cutting down on caffeine or alcohol. Both can make an overactive bladder worse. Caffeine is found in coffee, tea, and sodas.  Doing Kegel exercises to strengthen muscles.  Losing weight if you need to.  Eating a healthy and balanced diet to prevent constipation.  Keep a journal or log to track how much and when you drink and also when you feel the need to urinate. This will help your health care provider to monitor your condition. Contact a health care provider if:  Your symptoms do not get better after treatment.  Your pain and discomfort are getting worse.  You have more frequent urges to urinate.  You have a fever. Get help right away if: You are not able to control your bladder at all. This information is not intended to replace advice given to you by your health care provider. Make sure you discuss any questions you have with your health care provider. Document Released: 11/03/2008 Document Revised: 06/15/2015 Document Reviewed: 06/02/2013  2017 Elsevier  

## 2016-01-28 DIAGNOSIS — M159 Polyosteoarthritis, unspecified: Secondary | ICD-10-CM | POA: Insufficient documentation

## 2016-01-28 DIAGNOSIS — M150 Primary generalized (osteo)arthritis: Secondary | ICD-10-CM

## 2016-01-28 DIAGNOSIS — E039 Hypothyroidism, unspecified: Secondary | ICD-10-CM | POA: Insufficient documentation

## 2016-01-28 NOTE — Progress Notes (Signed)
BP (!) 139/54   Pulse (!) 58   Temp 97 F (36.1 C) (Oral)   Ht 5\' 7"  (1.702 m)   Wt 135 lb (61.2 kg)   LMP  (LMP Unknown)   BMI 21.14 kg/m    Subjective:    Patient ID: Amy [name], female    DOB: 12-Oct-1935, 81 y.o.   MRN: [id number]  Amy [name] is a 81 y.o. female presenting on 01/26/2016 for New Patient (Initial Visit) and Establish Care  HPI Patient here to be established as new patient at [facility].  This patient is [facility] to [facility] from [facility]. This patient comes in for periodic recheck on medications and conditions. All medications are reviewed today. There are no reports of any problems with the medications. All of the medical conditions are reviewed and updated.  Lab work is reviewed and will be ordered as medically necessary. There are no new problems reported with today's visit.    Past Medical History:  Diagnosis Date  . Anemia   . Arthritis   . Atrial flutter ([place])    s/p ablation  . HTN (hypertension)   . Palpitations    Relevant past medical, surgical, family and social history reviewed and updated as indicated. Interim medical history since our last visit reviewed. Allergies and medications reviewed and updated.   Data reviewed from any sources in EPIC.  Review of Systems  Constitutional: Negative.  Negative for activity change, fatigue and fever.  HENT: Negative.   Eyes: Negative.   Respiratory: Negative.  Negative for cough.   Cardiovascular: Negative.  Negative for chest pain.  Gastrointestinal: Negative.  Negative for abdominal pain.  Endocrine: Negative.   Genitourinary: Negative.  Negative for dysuria.  Musculoskeletal: Negative.   Skin: Negative.   Neurological: Negative.      Social History   Social History  . Marital status: Married    Spouse name: N/A  . Number of children: 1  . Years of education: N/A   Occupational History  . Retired    Social History Main Topics  . Smoking status: Never  Smoker  . Smokeless tobacco: Never Used  . Alcohol use No  . Drug use: No  . Sexual activity: Not on file   Other Topics Concern  . Not on file   Social History Narrative   Occ caffeine     Past Surgical History:  Procedure Laterality Date  . A FLUTTER ABLATION    . KNEE ARTHROSCOPY    . TUBAL LIGATION      Family History  Problem Relation Age of Onset  . Stroke Father 65    died from stroke at age 62  . Stroke Mother 30    died from stroke at age 75  . Heart disease Mother   . Colon cancer Brother     Allergies as of 01/26/2016   No Known Allergies     Medication List       Accurate as of 01/26/16 11:59 PM. Always use your most recent med list.          atenolol 25 MG tablet Commonly known as:  TENORMIN Take 1 tablet (25 mg total) by mouth daily.   CALCIUM + D PO 1 tab po qd   diltiazem 240 MG 24 hr capsule Commonly known as:  CARDIZEM CD TAKE ONE CAPSULE BY MOUTH EVERY DAY   dorzolamide 2 % ophthalmic solution Commonly known as:  TRUSOPT Place 1 drop into  both eyes 2 (two) times daily.   flecainide 50 MG tablet Commonly known as:  TAMBOCOR TAKE 1 TABLET BY MOUTH TWO TIMES DAILY   latanoprost 0.005 % ophthalmic solution Commonly known as:  XALATAN Place 1 drop into both eyes daily.   levothyroxine 50 MCG tablet Commonly known as:  SYNTHROID Take 1 tablet (50 mcg total) by mouth daily.   meloxicam 15 MG tablet Commonly known as:  MOBIC Take 1 tablet (15 mg total) by mouth as needed.   MULTIVITAMIN PO Take by mouth. Multivitamin for bones   oxybutynin 5 MG 24 hr tablet Commonly known as:  DITROPAN-XL Take 1 tablet (5 mg total) by mouth at bedtime.   XARELTO 15 MG Tabs tablet Generic drug:  Rivaroxaban TAKE 1 TABLET BY MOUTH EVERY DAY WITH SUPPER          Objective:    BP (!) 139/54   Pulse (!) 58   Temp 97 F (36.1 C) (Oral)   Ht 5\' 7"  (1.702 m)   Wt 135 lb (61.2 kg)   LMP  (LMP Unknown)   BMI 21.14 kg/m   No Known  Allergies Wt Readings from Last 3 Encounters:  01/26/16 135 lb (61.2 kg)  01/04/16 134 lb 3.2 oz (60.9 kg)  06/05/15 134 lb 12.8 oz (61.1 kg)    Physical Exam  Constitutional: She is oriented to person, place, and time. She appears well-developed and well-nourished.  HENT:  Head: Normocephalic and atraumatic.  Right Ear: Tympanic membrane, external ear and ear canal normal.  Left Ear: Tympanic membrane, external ear and ear canal normal.  Nose: Nose normal. No rhinorrhea.  Mouth/Throat: Oropharynx is clear and moist and mucous membranes are normal. No oropharyngeal exudate or posterior oropharyngeal erythema.  Eyes: Conjunctivae and EOM are normal. Pupils are equal, round, and reactive to light.  Neck: Normal range of motion. Neck supple.  Cardiovascular: Normal rate, regular rhythm, normal heart sounds and intact distal pulses.   Pulmonary/Chest: Effort normal and breath sounds normal.  Abdominal: Soft. Bowel sounds are normal.  Neurological: She is alert and oriented to person, place, and time. She has normal reflexes.  Skin: Skin is warm and dry. No rash noted.  Psychiatric: She has a normal mood and affect. Her behavior is normal. Judgment and thought content normal.    Results for orders placed or performed in visit on [id number]  Basic metabolic panel  Result Value Ref Range   Sodium 140 135 - 146 mmol/L   Potassium 4.7 3.5 - 5.3 mmol/L   Chloride 108 98 - 110 mmol/L   CO2 25 20 - 31 mmol/L   Glucose, Bld 81 65 - 99 mg/dL   BUN 22 7 - 25 mg/dL   Creat 1.50 (H) 0.60 - 0.93 mg/dL   Calcium 9.2 8.6 - 10.4 mg/dL  CBC  Result Value Ref Range   WBC 7.2 3.8 - 10.8 K/uL   RBC 3.77 (L) 3.80 - 5.10 MIL/uL   Hemoglobin 11.7 11.7 - 15.5 g/dL   HCT 35.7 35.0 - 45.0 %   MCV 94.7 80.0 - 100.0 fL   MCH 31.0 27.0 - 33.0 pg   MCHC 32.8 32.0 - 36.0 g/dL   RDW 13.3 11.0 - 15.0 %   Platelets 233 140 - 400 K/uL   MPV 10.2 7.5 - 12.5 fL  TSH  Result Value Ref Range   TSH 2.52 mIU/L       Assessment & Plan:   1. Atrial fibrillation, unspecified type (  [place]) Xarelto 15mg  one daily  2. Essential hypertension Diltiazem CD 240mg  one daily flecainaide 50mg  1 BID Atenolol 25 mg one daily  3. Premature atrial contractions  4. History of cardiovascular disorder  5. Overactive bladder - oxybutynin (DITROPAN-XL) 5 MG 24 hr tablet; Take 1 tablet (5 mg total) by mouth at bedtime.  Dispense: 90 tablet; Refill: 3  6. Hypothyroidism, unspecified type - levothyroxine (SYNTHROID) 50 MCG tablet; Take 1 tablet (50 mcg total) by mouth daily.  Dispense: 90 tablet; Refill: 3  7. Primary osteoarthritis involving multiple joints - meloxicam (MOBIC) 15 MG tablet; Take 1 tablet (15 mg total) by mouth as needed.  Dispense: 90 tablet; Refill: 2   Continue all other maintenance medications as listed above. Educational handout given for health maintenance                        Follow up plan: Return in about 1 year (around 01/25/2017) for recheck.  [name] PA-C [facility] [address]  [place], [place] [address] [phone]   01/28/2016, 6:04 PM

## 2016-02-06 ENCOUNTER — Other Ambulatory Visit: Payer: Self-pay | Admitting: Internal Medicine

## 2016-04-10 ENCOUNTER — Other Ambulatory Visit: Payer: Self-pay | Admitting: *Deleted

## 2016-04-11 ENCOUNTER — Other Ambulatory Visit: Payer: Self-pay | Admitting: Internal Medicine

## 2016-04-11 MED ORDER — ATENOLOL 25 MG PO TABS: 25.0000 mg | ORAL_TABLET | Freq: Every day | ORAL | 2 refills | Status: DC | PRN

## 2016-04-11 MED ORDER — RIVAROXABAN 15 MG PO TABS: ORAL_TABLET | ORAL | 5 refills | Status: DC

## 2016-04-11 NOTE — Telephone Encounter (Signed)
Agge 80 Wt 60.9 kg (01/04/2016)  Saw Dr Harrington Challenger Dec 14,2017 SrCr 1.5 (01/04/2016 ) Hgb 11.7 HCT 35.7 (01/04/2016) CrCl 29.7 Refill done for Xarelto 15mg  daily

## 2016-04-18 ENCOUNTER — Encounter: Payer: Self-pay | Admitting: Gastroenterology

## 2016-04-30 ENCOUNTER — Other Ambulatory Visit: Payer: Self-pay | Admitting: Internal Medicine

## 2016-05-20 ENCOUNTER — Encounter: Payer: Self-pay | Admitting: Internal Medicine

## 2016-05-21 ENCOUNTER — Other Ambulatory Visit: Payer: Self-pay | Admitting: Internal Medicine

## 2016-05-29 ENCOUNTER — Encounter: Payer: Self-pay | Admitting: Internal Medicine

## 2016-07-08 ENCOUNTER — Ambulatory Visit: Payer: Medicare Other | Admitting: Internal Medicine

## 2016-07-08 ENCOUNTER — Ambulatory Visit: Payer: Medicare Other | Admitting: Gastroenterology

## 2016-07-11 ENCOUNTER — Encounter: Payer: Self-pay | Admitting: Internal Medicine

## 2016-07-12 ENCOUNTER — Ambulatory Visit: Payer: Medicare Other | Admitting: Internal Medicine

## 2016-07-27 NOTE — Progress Notes (Signed)
Cardiology Office Note   Date:  07/29/2016   ID:  Amy [name], DOB 1935/12/07, MRN [id number]  PCP:  [name], PA-C  Cardiologist:   Dorris Carnes, MD    F/U of HTN and PAF     History of Present Illness: Amy [name] is a 81 y.o. female with a history of atrial flutter (s/p ablation), PAF and HTN  I saw her in Dec 2017 Since seen her breathing has been good  No CP  No palpitations  Walking some, admits to needing to do more      Outpatient Medications Prior to Visit  Medication Sig Dispense Refill  . atenolol (TENORMIN) 25 MG tablet Take 1 tablet (25 mg total) by mouth daily as needed. 90 tablet 2  . Calcium Carbonate-Vitamin D (CALCIUM + D PO) 1 tab po qd     . diltiazem (CARDIZEM CD) 240 MG 24 hr capsule TAKE ONE CAPSULE BY MOUTH EVERY DAY 90 capsule 1  . dorzolamide (TRUSOPT) 2 % ophthalmic solution Place 1 drop into both eyes 2 (two) times daily.  4  . flecainide (TAMBOCOR) 50 MG tablet TAKE 1 TABLET BY MOUTH TWO TIMES DAILY 180 tablet 1  . latanoprost (XALATAN) 0.005 % ophthalmic solution Place 1 drop into both eyes daily.  4  . levothyroxine (SYNTHROID) 50 MCG tablet Take 1 tablet (50 mcg total) by mouth daily. 90 tablet 3  . meloxicam (MOBIC) 15 MG tablet Take 1 tablet (15 mg total) by mouth as needed. 90 tablet 2  . Multiple Vitamin (MULTIVITAMIN PO) Take by mouth. Multivitamin for bones     . oxybutynin (DITROPAN-XL) 5 MG 24 hr tablet Take 1 tablet (5 mg total) by mouth at bedtime. 90 tablet 3  . Rivaroxaban (XARELTO) 15 MG TABS tablet TAKE 1 TABLET BY MOUTH EVERY DAY WITH SUPPER 30 tablet 5   No facility-administered medications prior to visit.      Allergies:   Patient has no known allergies.   Past Medical History:  Diagnosis Date  . Anemia   . Arthritis   . Atrial flutter ([place])    s/p ablation  . HTN (hypertension)   . Palpitations     Past Surgical History:  Procedure Laterality Date  . A FLUTTER ABLATION    . KNEE ARTHROSCOPY    . TUBAL  LIGATION       Social History:  The patient  reports that she has never smoked. She has never used smokeless tobacco. She reports that she does not drink alcohol or use drugs.   Family History:  The patient's family history includes Colon cancer in her brother; Heart disease in her mother; Stroke (age of onset: 54) in her mother; Stroke (age of onset: 24) in her father.    ROS:  Please see the history of present illness. All other systems are reviewed and  Negative to the above problem except as noted.    PHYSICAL EXAM: VS:  BP 138/68   Pulse (!) 55   Ht 5\' 7"  (1.702 m)   Wt 60.7 kg (133 lb 12.8 oz)   LMP  (LMP Unknown)   BMI 20.96 kg/m      BP L arm Pt's cuff:  186/80  BP office cuff  19070  \  GEN: Well nourished, well developed, in no acute distress  HEENT: normal  Neck: no JVD, carotid bruits, or masses Cardiac: RRR; no murmurs, rubs, or gallops,no edema  Respiratory:  clear to auscultation bilaterally,  normal work of breathing GI: soft, nontender, nondistended, + BS  No hepatomegaly  MS: no deformity Moving all extremities   Skin: warm and dry, no rash Neuro:  Strength and sensation are intact Psych: euthymic mood, full affect   EKG:  EKG is ordered today.  SB  RBBB  QRS 166    Lipid Panel    Component Value Date/Time   CHOL 218 (H) 06/05/2015 0946   TRIG 50 06/05/2015 0946   HDL 109 06/05/2015 0946   CHOLHDL 2.0 06/05/2015 0946   VLDL 10 06/05/2015 0946   LDLCALC 99 06/05/2015 0946   LDLDIRECT 84.9 11/15/2008 0924      Wt Readings from Last 3 Encounters:  07/29/16 60.7 kg (133 lb 12.8 oz)  01/26/16 61.2 kg (135 lb)  01/04/16 60.9 kg (134 lb 3.2 oz)      ASSESSMENT AND PLAN:  1  HTN  Pt brings in cuff to clinic today along with log   ON my check of cuff rel equal BP readings from home are up and down  HIghest 150s/  Lowest 90s  Pt asympomtatic throughout I woukd keep on same regimen  2  PAF  Continue on current regimen  Check labs  F?U in 6  months       Current medicines are reviewed at length with the patient today.  The patient does not have concerns regarding medicines.  Signed, Dorris Carnes, MD  07/29/2016 8:29 AM    [facility] [place], [place], [place]  [address] Phone: [phone]; Fax: [phone]

## 2016-07-29 ENCOUNTER — Ambulatory Visit (INDEPENDENT_AMBULATORY_CARE_PROVIDER_SITE_OTHER): Payer: Medicare Other | Admitting: Internal Medicine

## 2016-07-29 ENCOUNTER — Encounter: Payer: Self-pay | Admitting: Internal Medicine

## 2016-07-29 VITALS — BP 138/68 | HR 55 | Ht 67.0 in | Wt 133.8 lb

## 2016-07-29 DIAGNOSIS — I10 Essential (primary) hypertension: Secondary | ICD-10-CM | POA: Diagnosis not present

## 2016-07-29 DIAGNOSIS — E039 Hypothyroidism, unspecified: Secondary | ICD-10-CM

## 2016-07-29 DIAGNOSIS — I4891 Unspecified atrial fibrillation: Secondary | ICD-10-CM

## 2016-07-29 LAB — LIPID PANEL
Chol/HDL Ratio: 1.8 ratio (ref 0.0–4.4)
Cholesterol, Total: 205 mg/dL — ABNORMAL HIGH (ref 100–199)
HDL: 112 mg/dL (ref >39)
LDL CALC: 82 mg/dL (ref 0–99)
Triglycerides: 55 mg/dL (ref 0–149)
VLDL CHOLESTEROL CAL: 11 mg/dL (ref 5–40)

## 2016-07-29 LAB — BASIC METABOLIC PANEL
BUN / CREAT RATIO: 17 (ref 12–28)
BUN: 24 mg/dL (ref 8–27)
CO2: 21 mmol/L (ref 20–29)
Calcium: 9.6 mg/dL (ref 8.7–10.3)
Chloride: 105 mmol/L (ref 96–106)
Creatinine, Ser: 1.4 mg/dL — ABNORMAL HIGH (ref 0.57–1.00)
GFR calc Af Amer: 41 mL/min/{1.73_m2} — ABNORMAL LOW (ref >59)
GFR calc non Af Amer: 36 mL/min/{1.73_m2} — ABNORMAL LOW (ref >59)
GLUCOSE: 84 mg/dL (ref 65–99)
Potassium: 4.9 mmol/L (ref 3.5–5.2)
Sodium: 143 mmol/L (ref 134–144)

## 2016-07-29 LAB — CBC
HEMOGLOBIN: 11.9 g/dL (ref 11.1–15.9)
Hematocrit: 35.6 % (ref 34.0–46.6)
MCH: 31.2 pg (ref 26.6–33.0)
MCHC: 33.4 g/dL (ref 31.5–35.7)
MCV: 93 fL (ref 79–97)
Platelets: 220 10*3/uL (ref 150–379)
RBC: 3.81 x10E6/uL (ref 3.77–5.28)
RDW: 13 % (ref 12.3–15.4)
WBC: 6 10*3/uL (ref 3.4–10.8)

## 2016-07-29 LAB — TSH: TSH: 4.59 u[IU]/mL — ABNORMAL HIGH (ref 0.450–4.500)

## 2016-07-29 NOTE — Patient Instructions (Signed)
Your physician recommends that you continue on your current medications as directed. Please refer to the Current Medication list given to you today.  Your physician recommends that you return for lab work today (BMET, CBC, LIPIDS, TSH)  Your physician wants you to follow-up in: [place]. You will receive a reminder letter in the mail two months in advance. If you don't receive a letter, please call our office to schedule the follow-up appointment.

## 2016-07-30 ENCOUNTER — Ambulatory Visit: Payer: Medicare Other | Admitting: Physician Assistant

## 2016-08-05 ENCOUNTER — Ambulatory Visit: Payer: Medicare Other | Admitting: Gastroenterology

## 2016-08-19 ENCOUNTER — Ambulatory Visit: Payer: Medicare Other | Admitting: Gastroenterology

## 2016-10-18 ENCOUNTER — Ambulatory Visit: Payer: Medicare Other | Admitting: Gastroenterology

## 2016-11-05 ENCOUNTER — Other Ambulatory Visit: Payer: Self-pay | Admitting: Internal Medicine

## 2016-11-21 ENCOUNTER — Other Ambulatory Visit: Payer: Self-pay | Admitting: Internal Medicine

## 2016-11-28 ENCOUNTER — Telehealth: Payer: Self-pay | Admitting: Internal Medicine

## 2016-11-28 NOTE — Telephone Encounter (Signed)
New Message       *STAT* If patient is at the pharmacy, call can be transferred to refill team.   1. Which medications need to be refilled? (please list name of each medication and dose if known)  flecainide (TAMBOCOR) 50 MG tablet TAKE 1 TABLET BY MOUTH TWO TIMES DAILY     2. Which pharmacy/location (including street and city if local pharmacy) is medication to be sent to?  [facility]   3. Do they need a 30 day or 90 day supply? 90days

## 2016-11-28 NOTE — Telephone Encounter (Signed)
Called pt's pharmacy to inform them that pt's medication has already been sent to their pharmacy with refills. Pharmacy's tech verbalized understanding.

## 2016-12-03 ENCOUNTER — Encounter: Payer: Self-pay | Admitting: Physician Assistant

## 2016-12-03 ENCOUNTER — Ambulatory Visit: Payer: Medicare Other | Admitting: Physician Assistant

## 2016-12-03 VITALS — BP 130/54 | HR 59 | Temp 97.0°F | Ht 67.0 in | Wt 134.8 lb

## 2016-12-03 DIAGNOSIS — I4891 Unspecified atrial fibrillation: Secondary | ICD-10-CM

## 2016-12-03 DIAGNOSIS — H9313 Tinnitus, bilateral: Secondary | ICD-10-CM

## 2016-12-03 DIAGNOSIS — E039 Hypothyroidism, unspecified: Secondary | ICD-10-CM | POA: Diagnosis not present

## 2016-12-03 DIAGNOSIS — M1990 Unspecified osteoarthritis, unspecified site: Secondary | ICD-10-CM | POA: Insufficient documentation

## 2016-12-03 MED ORDER — APOAEQUORIN 10 MG PO CAPS: 1.0000 | ORAL_CAPSULE | Freq: Every day | ORAL | Status: DC

## 2016-12-03 NOTE — Patient Instructions (Signed)
In a few days you may receive a survey in the mail or online from [employer] regarding your visit with us today. Please take a moment to fill this out. Your feedback is very important to our whole office. It can help us better understand your needs as well as improve your experience and satisfaction. Thank you for taking your time to complete it. We care about you.  Senan Urey, PA-C  

## 2016-12-03 NOTE — Progress Notes (Signed)
BP (!) 130/54   Pulse (!) 59   Temp (!) 97 F (36.1 C) (Oral)   Ht 5\' 7"  (1.702 m)   Wt 134 lb 12.8 oz (61.1 kg)   LMP  (LMP Unknown)   BMI 21.11 kg/m    Subjective:    Patient ID: Amy [name], female    DOB: Dec 14, 1935, 81 y.o.   MRN: [id number]  HPI: Amy [name] is a 81 y.o. female presenting on 12/03/2016 for Follow-up (6 month ); Hypothyroidism; and Hypertension  This patient comes in for 29-month recheck on her conditions.  She has hypothyroidism, hypertension, atrial fibrillation with anticoagulation medication.  She also has long-standing joint pain and osteoarthritis.  She had a history of a meniscal tear in her right knee.  She is mostly complaining of pain in both knees and sometimes swelling in the left.  It does come and go.  It is not persistent.  She has not had any falls.  We have discussed using an orthopedic referral in the future.  She agrees with this.  She is also been feeling quite good with her atrial fibrillation.  Coincidentally ever since she was put on Xarelto she has not had any atrial fibrillation.  But she is still taking her medication well.  She will be due labs in 6 months.  Relevant past medical, surgical, family and social history reviewed and updated as indicated. Allergies and medications reviewed and updated.  Past Medical History:  Diagnosis Date  . Anemia   . Arthritis   . Atrial flutter ([place])    s/p ablation  . HTN (hypertension)   . Palpitations     Past Surgical History:  Procedure Laterality Date  . A FLUTTER ABLATION    . KNEE ARTHROSCOPY    . TUBAL LIGATION      Review of Systems  Constitutional: Negative.  Negative for activity change, fatigue and fever.  HENT: Positive for tinnitus.   Eyes: Negative.   Respiratory: Negative.  Negative for cough.   Cardiovascular: Negative.  Negative for chest pain.  Gastrointestinal: Negative.  Negative for abdominal pain.  Endocrine: Negative.   Genitourinary: Positive for frequency.  Negative for dysuria.  Musculoskeletal: Positive for arthralgias and joint swelling.  Skin: Negative.   Neurological: Negative.   Psychiatric/Behavioral: Negative.     Allergies as of 12/03/2016   No Known Allergies     Medication List        Accurate as of 12/03/16  3:12 PM. Always use your most recent med list.          Apoaequorin 10 MG Caps Commonly known as:  PREVAGEN Take 1 tablet daily by mouth.   atenolol 25 MG tablet Commonly known as:  TENORMIN Take 1 tablet (25 mg total) by mouth daily as needed.   Biotin 5000 MCG Caps Take by mouth.   CALCIUM + D PO 1 tab po qd   diltiazem 240 MG 24 hr capsule Commonly known as:  CARDIZEM CD TAKE ONE CAPSULE BY MOUTH EVERY DAY   flecainide 50 MG tablet Commonly known as:  TAMBOCOR TAKE 1 TABLET BY MOUTH TWO TIMES DAILY   latanoprost 0.005 % ophthalmic solution Commonly known as:  XALATAN Place 1 drop into both eyes daily.   levothyroxine 50 MCG tablet Commonly known as:  SYNTHROID Take 1 tablet (50 mcg total) by mouth daily.   meloxicam 15 MG tablet Commonly known as:  MOBIC Take 1 tablet (15 mg total) by mouth as needed.  Rivaroxaban 15 MG Tabs tablet Commonly known as:  XARELTO TAKE 1 TABLET BY MOUTH EVERY DAY WITH SUPPER          Objective:    BP (!) 130/54   Pulse (!) 59   Temp (!) 97 F (36.1 C) (Oral)   Ht 5\' 7"  (1.702 m)   Wt 134 lb 12.8 oz (61.1 kg)   LMP  (LMP Unknown)   BMI 21.11 kg/m   No Known Allergies  Physical Exam  Constitutional: She is oriented to person, place, and time. She appears well-developed and well-nourished.  HENT:  Head: Normocephalic and atraumatic.  Right Ear: Tympanic membrane, external ear and ear canal normal.  Left Ear: Tympanic membrane, external ear and ear canal normal.  Nose: Nose normal. No rhinorrhea.  Mouth/Throat: Oropharynx is clear and moist and mucous membranes are normal. No oropharyngeal exudate or posterior oropharyngeal erythema.  Eyes:  Conjunctivae and EOM are normal. Pupils are equal, round, and reactive to light.  Neck: Normal range of motion. Neck supple.  Cardiovascular: Normal rate, regular rhythm, normal heart sounds and intact distal pulses.  Pulmonary/Chest: Effort normal and breath sounds normal.  Abdominal: Soft. Bowel sounds are normal.  Neurological: She is alert and oriented to person, place, and time. She has normal reflexes.  Skin: Skin is warm and dry. No rash noted.  Psychiatric: She has a normal mood and affect. Her behavior is normal. Judgment and thought content normal.  Nursing note and vitals reviewed.   Results for orders placed or performed in visit on 07/29/16  CBC  Result Value Ref Range   WBC 6.0 3.4 - 10.8 x10E3/uL   RBC 3.81 3.77 - 5.28 x10E6/uL   Hemoglobin 11.9 11.1 - 15.9 g/dL   Hematocrit 35.6 34.0 - 46.6 %   MCV 93 79 - 97 fL   MCH 31.2 26.6 - 33.0 pg   MCHC 33.4 31.5 - 35.7 g/dL   RDW 13.0 12.3 - 15.4 %   Platelets 220 150 - 379 [id number]  Basic metabolic panel  Result Value Ref Range   Glucose 84 65 - 99 mg/dL   BUN 24 8 - 27 mg/dL   Creatinine, Ser 1.40 (H) 0.57 - 1.00 mg/dL   GFR calc non Af Amer 36 (L) >59 mL/min/1.73   GFR calc Af Amer 41 (L) >59 mL/min/1.73   BUN/Creatinine Ratio 17 12 - 28   Sodium 143 134 - 144 mmol/L   Potassium 4.9 3.5 - 5.2 mmol/L   Chloride 105 96 - 106 mmol/L   CO2 21 20 - 29 mmol/L   Calcium 9.6 8.7 - 10.3 mg/dL  Lipid panel  Result Value Ref Range   Cholesterol, Total 205 (H) 100 - 199 mg/dL   Triglycerides 55 0 - 149 mg/dL   HDL 112 >39 mg/dL   VLDL Cholesterol Cal 11 5 - 40 mg/dL   LDL Calculated 82 0 - 99 mg/dL   Chol/HDL Ratio 1.8 0.0 - 4.4 ratio  TSH  Result Value Ref Range   TSH 4.590 (H) 0.450 - 4.500 uIU/mL      Assessment & Plan:   1. Atrial fibrillation, unspecified type ([place])  2. Hypothyroidism, unspecified type  3. Osteoarthritis, unspecified osteoarthritis type, unspecified site  4. Tinnitus of both  ears  Continue all current medications at this time.  Continue care with cardiology.  Recheck labs in 6 months.  Call if knees get any worse.  Will consider Ortho appointment.   Current Outpatient Medications:  .  atenolol (TENORMIN) 25 MG tablet, Take 1 tablet (25 mg total) by mouth daily as needed., Disp: 90 tablet, Rfl: 2 .  Biotin 5000 MCG CAPS, Take by mouth., Disp: , Rfl:  .  Calcium Carbonate-Vitamin D (CALCIUM + D PO), 1 tab po qd , Disp: , Rfl:  .  diltiazem (CARDIZEM CD) 240 MG 24 hr capsule, TAKE ONE CAPSULE BY MOUTH EVERY DAY, Disp: 90 capsule, Rfl: 2 .  flecainide (TAMBOCOR) 50 MG tablet, TAKE 1 TABLET BY MOUTH TWO TIMES DAILY, Disp: 180 tablet, Rfl: 1 .  latanoprost (XALATAN) 0.005 % ophthalmic solution, Place 1 drop into both eyes daily., Disp: , Rfl: 4 .  levothyroxine (SYNTHROID) 50 MCG tablet, Take 1 tablet (50 mcg total) by mouth daily., Disp: 90 tablet, Rfl: 3 .  meloxicam (MOBIC) 15 MG tablet, Take 1 tablet (15 mg total) by mouth as needed., Disp: 90 tablet, Rfl: 2 .  Rivaroxaban (XARELTO) 15 MG TABS tablet, TAKE 1 TABLET BY MOUTH EVERY DAY WITH SUPPER, Disp: 30 tablet, Rfl: 5 .  Apoaequorin (PREVAGEN) 10 MG CAPS, Take 1 tablet daily by mouth., Disp: , Rfl:  Continue all other maintenance medications as listed above.  Follow up plan: Return in about 6 months (around 06/02/2017) for recheck.  Educational handout given for [place] PA-C [place] [address]  [place], [place] [address] [phone]   12/03/2016, 3:12 PM

## 2016-12-23 ENCOUNTER — Encounter: Payer: Self-pay | Admitting: Internal Medicine

## 2017-01-01 ENCOUNTER — Telehealth: Payer: Self-pay | Admitting: Internal Medicine

## 2017-01-01 DIAGNOSIS — I10 Essential (primary) hypertension: Secondary | ICD-10-CM

## 2017-01-01 NOTE — Telephone Encounter (Signed)
New message   Patient calling to report BP concerns.   Pt c/o BP issue: STAT if pt c/o blurred vision, one-sided weakness or slurred speech  1. What are your last 5 BP readings?  200/88, 181/78,   2. Are you having any other symptoms (ex. Dizziness, headache, blurred vision, passed out)? No  3. What is your BP issue? BP too high, no chest pain , no shortness of breath

## 2017-01-01 NOTE — Telephone Encounter (Signed)
BP this month has been little higher than normal.  160s-180s earlier this month. 200/88 just after midnight.  Face felt flushed, little headache. Went to bed.  Took 2 atenolols 6pm, 11pm One atenolol today about 4:20 pm. BP 146/76, HR 63, regular.- then 166/76, HR 63 around 5:40pm. Started using CBD oil around Thanksgiving.  Otherwise no changes in medications or diet.  She is aware I am forwarding to Dr. Harrington Challenger for recommendations and will call her back tomorrow.

## 2017-01-02 MED ORDER — LOSARTAN POTASSIUM 25 MG PO TABS: 25.0000 mg | ORAL_TABLET | Freq: Every day | ORAL | 6 refills | Status: DC

## 2017-01-02 NOTE — Telephone Encounter (Signed)
BP is up    I would recomm 25 cozaar    F?U BP over next few wks  This is low dose  May need to increase Get BMET in 7 days

## 2017-01-02 NOTE — Telephone Encounter (Signed)
Follow up  Pt verbalized that she is returning call for the rn

## 2017-01-02 NOTE — Telephone Encounter (Signed)
Spoke with patient. She will start cozaar 25 mg once per day. Appointment for bmet on 12/20. Pt will continue to keep track of BPs and send/call with update in about 3 weeks.

## 2017-01-04 ENCOUNTER — Encounter: Payer: Self-pay | Admitting: Internal Medicine

## 2017-01-08 ENCOUNTER — Telehealth: Payer: Self-pay | Admitting: Internal Medicine

## 2017-01-08 ENCOUNTER — Telehealth: Payer: Self-pay | Admitting: Cardiology

## 2017-01-08 MED ORDER — LOSARTAN POTASSIUM 50 MG PO TABS: 50.0000 mg | ORAL_TABLET | Freq: Every day | ORAL | 3 refills | Status: DC

## 2017-01-08 NOTE — Telephone Encounter (Signed)
Pt called answering service to discuss elevated BP of 182/78 and 192/76. I called her back and she states that since her call to answering service she has talked to [facility], Dr. Alan Ripper nurse and they discovered that when the patient recently increased her losartan from 25 mg to 50 mg she was taking it in divided doses, 25 mg am and 25 mg pm. She was advised to take all of the 50 mg at once in the morning. She is not having any worrisome symptoms. I reassured her that it will take several days for the medication adjustment to take effect. She is going to take one of her prn atenolol 25 mg doses as she would feel better to do something. She has an appt for lab work tomorrow. If her BP remains up after a few more days she may need to go to the hypertension clinic for further med adjustment.   She felt better at the end of the call.  Daune Perch, AGNP-C [facility] 01/08/2017  6:07 PM

## 2017-01-08 NOTE — Telephone Encounter (Signed)
Pt called in regards to her High BP today 166/74   Hr 75,  Pt will be here for Blood work tomorrow 12/20.  Pt would like to know what to do.

## 2017-01-08 NOTE — Telephone Encounter (Signed)
Fay Records, MD  Rodman Key, RN        Ms Fewell's BP was still high at times on 25 losartan Is increasing to 50 mg   I told her that you would call her to sched BMET next week on this dose.     Patient is scheduled 12/20 for blood work (since she was new to losartan 1 wk ago).  Reached patient on mobile number. She has been taking losartan 25 mg two times a day instead of once daily.  BP this afternoon 4:30 pm182/78 and then 192/76. (after taking 2nd tablet of losartan). Feels flushed, otherwise, no other symptoms, no neuro changes.  She will start tomorrow taking losartan 50 mg once a day.   When she comes for lab work tomorrow she will bring her cuff and I can check her BP.  We will set her up to be seen in HTN  Clinic.

## 2017-01-09 ENCOUNTER — Ambulatory Visit (INDEPENDENT_AMBULATORY_CARE_PROVIDER_SITE_OTHER): Payer: Medicare Other | Admitting: *Deleted

## 2017-01-09 ENCOUNTER — Other Ambulatory Visit: Payer: Medicare Other | Admitting: *Deleted

## 2017-01-09 VITALS — BP 178/72 | Resp 16 | Wt 131.1 lb

## 2017-01-09 DIAGNOSIS — Z0130 Encounter for examination of blood pressure without abnormal findings: Secondary | ICD-10-CM | POA: Diagnosis not present

## 2017-01-09 DIAGNOSIS — I10 Essential (primary) hypertension: Secondary | ICD-10-CM

## 2017-01-09 LAB — BASIC METABOLIC PANEL
BUN/Creatinine Ratio: 13 (ref 12–28)
BUN: 20 mg/dL (ref 8–27)
CALCIUM: 10 mg/dL (ref 8.7–10.3)
CO2: 24 mmol/L (ref 20–29)
CREATININE: 1.59 mg/dL — AB (ref 0.57–1.00)
Chloride: 102 mmol/L (ref 96–106)
GFR, EST AFRICAN AMERICAN: 35 mL/min/{1.73_m2} — AB (ref >59)
GFR, EST NON AFRICAN AMERICAN: 31 mL/min/{1.73_m2} — AB (ref >59)
Glucose: 65 mg/dL (ref 65–99)
Potassium: 4.8 mmol/L (ref 3.5–5.2)
Sodium: 141 mmol/L (ref 134–144)

## 2017-01-09 NOTE — Progress Notes (Signed)
1.) Reason for visit: BP check  2.) Name of MD requesting visit: Dr. Harrington Challenger  3.) H&P: BP high on patient's home cuff.  Recently started losartan 25 mg and increased to 50 mg 3 days ago but pt was taking 25 mg BID instead.  Uses atenolol 25 mg daily as needed for BP also.  4.) ROS related to problem: Home cuff today 164/82, manual cuff BP is 178/72.  Pt has not taken losartan yet today.  Will take it now and then every AM as 50 mg.  5.) Assessment and plan per MD: Advised patient to try to limit number of times she checks BP at home.  She has days with numerous BP readings, which she admits makes her anxious and possibly contributing to raising BP.  Advised her to take losartan 50 mg daily and in about 10 days use MyChart to let Dr. Harrington Challenger know BPs.  Educated on sodium reduction.  Reviewed s/s of stroke (her mother had a stroke and she is fearful of this happening to her). Also reviewed symptoms of low BP.  Advised if Dr. Harrington Challenger has further recommendations we will contact her.

## 2017-01-13 ENCOUNTER — Telehealth: Payer: Self-pay | Admitting: Internal Medicine

## 2017-01-13 NOTE — Telephone Encounter (Signed)
BP is high before meds   After meds 140s, 120s, 141  Before bed  169/73  P OK    50 mg losartan Has been on this since 12/20 Cardiazem 240    Takes atenolol 25 prn for bp  REcomm:  Take losartan at lunch Take atenolol 25 daily not as needed Keep on cardiazem   email in at end of week Drink more fluids

## 2017-01-21 ENCOUNTER — Encounter: Payer: Self-pay | Admitting: Internal Medicine

## 2017-01-23 ENCOUNTER — Ambulatory Visit (INDEPENDENT_AMBULATORY_CARE_PROVIDER_SITE_OTHER): Payer: Medicare Other | Admitting: Pharmacist

## 2017-01-23 VITALS — BP 152/60 | HR 63

## 2017-01-23 DIAGNOSIS — I10 Essential (primary) hypertension: Secondary | ICD-10-CM

## 2017-01-23 MED ORDER — LOSARTAN POTASSIUM 50 MG PO TABS: 75.0000 mg | ORAL_TABLET | Freq: Every day | ORAL | 11 refills | Status: DC

## 2017-01-23 NOTE — Patient Instructions (Addendum)
It was nice to meet you today!  Your blood pressure goal is less than 140/90  Continue taking your diltiazem in the morning  Take your losartan and atenolol at 2pm. Increase the losartan to 75mg  daily.  Check your blood pressure at 4pm or later, no more than 2 or 3 times per day.  Follow up in clinic in 2 weeks on Friday, January 18th at 3pm

## 2017-01-23 NOTE — Telephone Encounter (Signed)
Called patient and scheduled her with HTN clinic PharmD today at 4pm.  Pt is appreciative for this appointment.

## 2017-01-23 NOTE — Progress Notes (Signed)
Patient ID: Amy [name]                 DOB: 25-Feb-1935                      MRN: [id number]     HPI: Amy [name] is a pleasant 82 y.o. female referred by Dr. Harrington [name] to HTN clinic. PMH is significant for atrial flutter s/p ablation and HTN. BP readings have typically ranged 130s/50-60s, however BP was elevated at 178/72 at recent nurse visit on 12/20. Pt called clinic a few days later with reports of elevated BP before taking her medications. After HTN medication administration, SBP improved to 120-140s. Pt was advised to change atenolol from prn to daily. Pt remained concerned about fluctuating BP readings throughout the day and presents today for further management.  Pt presents today with her husband in good spirits. She has been taking her diltiazem in the morning and reports that this keeps her BP well controlled. She takes her losartan in the afternoon and her atenolol in the evening. She reports that her BP readings start trending up around 3-4pm to 150-160s. She had a few isolated readings of 409 and 811 systolic. She does check her BP at home frequently which causes some stress and anxiety. Her mother had a stroke and pt is fearful of this happening to her. She does not use her meloxicam at all. She has been using a CBD sublingual oil that has been helping with her arthritic pain.   Current HTN meds: atenolol 25mg  daily (AM), losartan 50mg  daily (lunch), diltiazem 240mg  daily9 MA) BP goal: <140/49mmHg due to age  Family History: Mother had history of stroke.  Social History: Denies tobacco, alcohol, and illicit drug use.  Diet: No caffeine. Eats out more than cooking at home. Likes [employer] and [employer].  Exercise: Walks frequently   Home BP readings: 914N systolic in the morning. Well controlled through the early afternoon. BP starts increasing around 3-4pm to 829-562Z systolic. A few occasional high readings of 170 and 200.  Wt Readings from Last 3 Encounters:  01/09/17 131 lb 1.9 oz  (59.5 kg)  12/03/16 134 lb 12.8 oz (61.1 kg)  07/29/16 133 lb 12.8 oz (60.7 kg)   BP Readings from Last 3 Encounters:  01/09/17 (!) 178/72  12/03/16 (!) 130/54  07/29/16 138/68   Pulse Readings from Last 3 Encounters:  12/03/16 (!) 59  07/29/16 (!) 55  01/26/16 (!) 58    Renal function: CrCl cannot be calculated (Unknown ideal weight.).  Past Medical History:  Diagnosis Date  . Anemia   . Arthritis   . Atrial flutter ([place])    s/p ablation  . HTN (hypertension)   . Palpitations     Current Outpatient Medications on File Prior to Visit  Medication Sig Dispense Refill  . Apoaequorin (PREVAGEN) 10 MG CAPS Take 1 tablet daily by mouth.    Marland Kitchen atenolol (TENORMIN) 25 MG tablet Take 1 tablet (25 mg total) by mouth daily as needed. 90 tablet 2  . Biotin 5000 MCG CAPS Take by mouth.    . Calcium Carbonate-Vitamin D (CALCIUM + D PO) 1 tab po qd     . diltiazem (CARDIZEM CD) 240 MG 24 hr capsule TAKE ONE CAPSULE BY MOUTH EVERY DAY 90 capsule 2  . flecainide (TAMBOCOR) 50 MG tablet TAKE 1 TABLET BY MOUTH TWO TIMES DAILY 180 tablet 1  . latanoprost (XALATAN) 0.005 % ophthalmic solution Place 1  drop into both eyes daily.  4  . levothyroxine (SYNTHROID) 50 MCG tablet Take 1 tablet (50 mcg total) by mouth daily. 90 tablet 3  . losartan (COZAAR) 50 MG tablet Take 1 tablet (50 mg total) by mouth daily. 90 tablet 3  . meloxicam (MOBIC) 15 MG tablet Take 1 tablet (15 mg total) by mouth as needed. 90 tablet 2  . Rivaroxaban (XARELTO) 15 MG TABS tablet TAKE 1 TABLET BY MOUTH EVERY DAY WITH SUPPER (Patient taking differently: 15 mg every other day. TAKE 1 TABLET BY MOUTH EVERY DAY WITH SUPPER) 30 tablet 5  . [DISCONTINUED] estrogen, conjugated,-medroxyprogesterone (PREMPRO) 0.3-1.5 MG per tablet Take 1 tablet by mouth 2 (two) times a week.     . [DISCONTINUED] warfarin (COUMADIN) 5 MG tablet Take as directed by Anticoagulation clinic 35 tablet 3   No current facility-administered medications on  file prior to visit.     No Known Allergies   Assessment/Plan:  1. Hypertension - BP remains elevated above goal <140/26mmHg however pt has not taken her losartan yet today which she usually takes around lunch. HR in the low 60s so will not titrate up diltiazem or atenolol. SCr relatively stable ~1.5 and K 4.7-4.9. Will increase losartan to 75mg  daily and move atenolol and losartan dose to 2pm to help target elevated PM readings that begin around 3-4pm. Encouraged pt to check her BP no more than 3x per day. Will f/u in HTN clinic in 2 weeks and check BMET at that time. Of note, f/u visit is the day after pt's husband has rotator cuff surgery, so pt may be a bit stressed. Pt will keep f/u visit with Dr Amy [name] in 1 month.   Yahmir Sokolov E. Mackinley Kiehn, PharmD, CPP, [place] [facility] [address] [place], [place] [address] Phone: [phone]; Fax: [phone] 01/23/2017 4:33 PM

## 2017-01-29 ENCOUNTER — Other Ambulatory Visit: Payer: Self-pay | Admitting: Physician Assistant

## 2017-01-29 DIAGNOSIS — E039 Hypothyroidism, unspecified: Secondary | ICD-10-CM

## 2017-02-04 NOTE — Progress Notes (Signed)
Patient ID: Amy [name]                 DOB: June 25, 1935                      MRN: [id number]     HPI: Amy [name] is a pleasant 82 y.o. female referred by Dr. Harrington Challenger to HTN clinic. PMH is significant for atrial flutter s/p ablation and HTN. BP readings have typically ranged 130s/50-60s, however BP was elevated at 178/72 at recent nurse visit on 12/20. Pt called clinic a few days later with reports of elevated BP before taking her medications. After HTN medication administration, SBP improved to 120-140s. Pt was advised to change atenolol from prn to daily. Pt remained concerned about fluctuating BP readings throughout the day. At her most recent OV in HTN clinic her losartan was increased to 75mg  daily and her atenolol dose was shifted to the afternoon to help with elevated afternoon measurements.    Previously she reported that her mother had a stroke and pt is fearful of this happening to her. She does not use her meloxicam at all. She has been using a CBD sublingual oil that has been helping with her arthritic pain.   Pt presents today for follow up with her husband and reports that pressures are about the same. She still has elevated pressures in the evening time and early mornings. Of note her elevated morning pressures are usually prior to medications.   She did have one "low" measurement with systolic being 301. She reports she felt ok during this time.  Meloxicam only once or twice a week. She uses as sparingly as possible.   Current HTN meds: atenolol 25mg  daily (AM), losartan 75mg  daily (lunch), diltiazem 240mg  daily 9 AM) BP goal: <140/50mmHg due to age  Family History: Mother had history of stroke.  Social History: Denies tobacco, alcohol, and illicit drug use.  Diet: No caffeine. Eats out more than cooking at home. Likes [employer] and [employer].  Exercise: Walks frequently   Home BP readings: early mornings (before medications) - 140s-170s/60-70s, mid morning/early afternoons 103 -  140s/60-70s, mid afternoon/early evening 120-170s/60-70s (mostly 601U systolic), evening/late evening 140s-170s/60s-70s (mostly 932T systolic)  Wt Readings from Last 3 Encounters:  01/09/17 131 lb 1.9 oz (59.5 kg)  12/03/16 134 lb 12.8 oz (61.1 kg)  07/29/16 133 lb 12.8 oz (60.7 kg)   BP Readings from Last 3 Encounters:  01/23/17 (!) 152/60  01/09/17 (!) 178/72  12/03/16 (!) 130/54   Pulse Readings from Last 3 Encounters:  01/23/17 63  12/03/16 (!) 59  07/29/16 (!) 55    Renal function: CrCl cannot be calculated (Patient's most recent lab result is older than the maximum 21 days allowed.).  Past Medical History:  Diagnosis Date  . Anemia   . Arthritis   . Atrial flutter ([place])    s/p ablation  . HTN (hypertension)   . Palpitations     Current Outpatient Medications on File Prior to Visit  Medication Sig Dispense Refill  . Apoaequorin (PREVAGEN) 10 MG CAPS Take 1 tablet daily by mouth.    Marland Kitchen atenolol (TENORMIN) 25 MG tablet Take 1 tablet (25 mg total) by mouth daily.    . Biotin 5000 MCG CAPS Take by mouth.    . Calcium Carbonate-Vitamin D (CALCIUM + D PO) 1 tab po qd     . diltiazem (CARDIZEM CD) 240 MG 24 hr capsule TAKE ONE CAPSULE BY MOUTH  EVERY DAY 90 capsule 2  . flecainide (TAMBOCOR) 50 MG tablet TAKE 1 TABLET BY MOUTH TWO TIMES DAILY 180 tablet 1  . latanoprost (XALATAN) 0.005 % ophthalmic solution Place 1 drop into both eyes daily.  4  . levothyroxine (SYNTHROID, LEVOTHROID) 50 MCG tablet TAKE 1 TABLET (50 MCG TOTAL) BY MOUTH DAILY. 90 tablet 0  . losartan (COZAAR) 50 MG tablet Take 1.5 tablets (75 mg total) by mouth daily. 45 tablet 11  . meloxicam (MOBIC) 15 MG tablet Take 1 tablet (15 mg total) by mouth as needed. 90 tablet 2  . Rivaroxaban (XARELTO) 15 MG TABS tablet TAKE 1 TABLET BY MOUTH EVERY DAY WITH SUPPER (Patient taking differently: 15 mg every other day. TAKE 1 TABLET BY MOUTH EVERY DAY WITH SUPPER) 30 tablet 5  . [DISCONTINUED] estrogen,  conjugated,-medroxyprogesterone (PREMPRO) 0.3-1.5 MG per tablet Take 1 tablet by mouth 2 (two) times a week.     . [DISCONTINUED] warfarin (COUMADIN) 5 MG tablet Take as directed by Anticoagulation clinic 35 tablet 3   No current facility-administered medications on file prior to visit.     No Known Allergies  BP - 138/64 HR - 56  Assessment/Plan:  1. Hypertension - repeat BMET today. Increase losartan to 100mg  daily and continue to take in early afternoon to control evening time pressures. This will also hopefully decrease those early morning pressures as well. Will plan to follow up with Dr. Harrington Challenger in 2 weeks as scheduled (will order BMET for this day as well) and HTN clinic as needed after for additional medication management.    Thank you, Lelan Pons. [facility] [place], [place]  [address], [place], [place] [address]  Phone: [phone]; Fax: [phone] 02/04/2017 12:30 PM

## 2017-02-05 ENCOUNTER — Other Ambulatory Visit: Payer: Medicare Other | Admitting: *Deleted

## 2017-02-05 ENCOUNTER — Ambulatory Visit (INDEPENDENT_AMBULATORY_CARE_PROVIDER_SITE_OTHER): Payer: Medicare Other | Admitting: Pharmacist

## 2017-02-05 VITALS — BP 138/64 | HR 56

## 2017-02-05 DIAGNOSIS — I10 Essential (primary) hypertension: Secondary | ICD-10-CM | POA: Diagnosis not present

## 2017-02-05 LAB — BASIC METABOLIC PANEL
BUN / CREAT RATIO: 16 (ref 12–28)
BUN: 27 mg/dL (ref 8–27)
CO2: 22 mmol/L (ref 20–29)
CREATININE: 1.66 mg/dL — AB (ref 0.57–1.00)
Calcium: 9.7 mg/dL (ref 8.7–10.3)
Chloride: 101 mmol/L (ref 96–106)
GFR calc Af Amer: 34 mL/min/{1.73_m2} — ABNORMAL LOW (ref >59)
GFR, EST NON AFRICAN AMERICAN: 29 mL/min/{1.73_m2} — AB (ref >59)
GLUCOSE: 54 mg/dL — AB (ref 65–99)
POTASSIUM: 4.5 mmol/L (ref 3.5–5.2)
SODIUM: 139 mmol/L (ref 134–144)

## 2017-02-05 MED ORDER — LOSARTAN POTASSIUM 100 MG PO TABS: 100.0000 mg | ORAL_TABLET | Freq: Every day | ORAL | 1 refills | Status: DC

## 2017-02-05 NOTE — Patient Instructions (Addendum)
INCREASE losartan to 100mg  daily (you make take 2 tablets losartan 50mg  daily until you run out then pick up higher strength at pharmacy 1 tablet 100mg  daily)  ONLY monitor pressures 3 times daily   Follow up in blood pressure clinic in 3-4 weeks

## 2017-02-07 ENCOUNTER — Other Ambulatory Visit: Payer: Medicare Other

## 2017-02-07 ENCOUNTER — Ambulatory Visit: Payer: Medicare Other

## 2017-02-17 ENCOUNTER — Encounter: Payer: Self-pay | Admitting: Physician Assistant

## 2017-02-17 ENCOUNTER — Ambulatory Visit: Payer: Medicare Other | Admitting: Physician Assistant

## 2017-02-17 VITALS — BP 138/89 | HR 68 | Temp 97.9°F | Ht 67.0 in | Wt 137.0 lb

## 2017-02-17 DIAGNOSIS — K5904 Chronic idiopathic constipation: Secondary | ICD-10-CM

## 2017-02-17 MED ORDER — LACTULOSE 20 G PO PACK: 20.0000 g | PACK | Freq: Two times a day (BID) | ORAL | 5 refills | Status: DC

## 2017-02-17 NOTE — Patient Instructions (Signed)
Fecal Impaction °A fecal impaction is a large, firm amount of stool (feces) that will not pass out of the body. A fecal impaction usually occurs in the end of the large intestine (rectum). It can block the large intestine and cause significant problems. °What are the causes? °This condition may be caused by anything that slows down bowel movements, including: °· Long-term use of medicines that help you have a bowel movement (laxatives). °· Constipation. °· Pain in the rectum. Fecal impaction can occur if you avoid having bowel movements due to the pain. Pain in the rectum can result from a medical condition, such as hemorrhoids or anal fissures. °· Narcotic pain-relieving medicines, such as methadone, morphine, or codeine. °· Not drinking enough fluids. °· Being inactive for a long period of time. °· Diseases of the brain or nervous system that damage nerves that control the muscles of the intestines. ° °What are the signs or symptoms? °Symptoms of this condition include: °· Breathing problems. °· Nausea, vomiting, and dehydration. °· Dizziness. °· Confusion. °· Rapid heartbeat. °· Fever. °· Sweating. °· Changes in blood pressure. °· Not having a normal number of bowel movements. °· Changes in bowel patterns. This may include going to the bathroom less often or not at all. °· A sense of fullness in the rectum but being unable to pass stool. °· Pain or cramps in the abdominal area. These often happen after meals. °· Thin, watery discharge from the rectum. ° °How is this diagnosed? °This condition may be diagnosed based on your symptoms and an exam of your rectum. Sometimes X-rays or lab tests are done to confirm the diagnosis and to check for other problems. °How is this treated? °This condition may be treated by: °· Having your health care provider remove the stool using a gloved finger. °· Taking medicine. °· A suppository or enema given in the rectum to soften the stool, which can stimulate a bowel  movement. ° °Follow these instructions at home: °Eating and drinking °· Drink enough fluid to keep your urine clear or pale yellow. °· Include a lot of fiber in your diet. Foods with a lot of fiber include fruits, vegetables, and oatmeal. °· If you begin to get constipated, increase the amount of fiber in your diet. °General instructions °· Develop bowel habits. An example of a bowel habit is having a bowel movement right after breakfast every day. Be sure to give yourself enough time on the toilet. This may require using enemas, bowel softeners, or suppositories at home, as directed by your health care provider. It may also include using mineral oil or olive oil. °· Exercise regularly. °· Take over-the-counter and prescription medicines only as told by your health care provider. °Contact a health care provider if: °· You have ongoing pain in your rectum. °· You need to use an enema or a suppository more than 2 times a week. °· You have rectal bleeding. °· You continue to have problems. The problems may include not being able to go to the bathroom and long-term (chronic) constipation. °· You have pain in your abdomen. °· You have thin, pencil-like stools. °Get help right away if: °· You have black or tarry stools. °This information is not intended to replace advice given to you by your health care provider. Make sure you discuss any questions you have with your health care provider. °Document Released: 09/30/2003 Document Revised: 08/11/2015 Document Reviewed: 07/13/2015 °Elsevier Interactive Patient Education © 2018 [employer] ° °

## 2017-02-18 DIAGNOSIS — K5904 Chronic idiopathic constipation: Secondary | ICD-10-CM | POA: Insufficient documentation

## 2017-02-18 NOTE — Progress Notes (Signed)
BP 138/89   Pulse 68   Temp 97.9 F (36.6 C) (Oral)   Ht 5\' 7"  (1.702 m)   Wt 137 lb (62.1 kg)   LMP  (LMP Unknown)   BMI 21.46 kg/m    Subjective:    Patient ID: Amy [name], female    DOB: [birth year], 82 y.o.   MRN: [id number]  HPI: Amy [name] is a 82 y.o. female presenting on 02/17/2017 for Flank Pain (left side )  This patient has had lifelong issues with constipation.  She states she has been having a significant problem over the past couple of months.  She has used over-the-counter medications without much relief.  She will occasionally have fecal soiling where she has to wear a pad.  I explained to her that when you have an impaction in the colon so full of stool that liquidy stool is the only thing that can escape.  She feels that that is what is happening here.  She denies any fever or chills.  There is no blood.  There is no mucus in the stool.  Relevant past medical, surgical, family and social history reviewed and updated as indicated. Allergies and medications reviewed and updated.  Past Medical History:  Diagnosis Date  . Anemia   . Arthritis   . Atrial flutter ([place])    s/p ablation  . HTN (hypertension)   . Palpitations     Past Surgical History:  Procedure Laterality Date  . A FLUTTER ABLATION    . KNEE ARTHROSCOPY    . TUBAL LIGATION      Review of Systems  Constitutional: Negative.   HENT: Negative.   Eyes: Negative.   Respiratory: Negative.   Gastrointestinal: Positive for abdominal pain, constipation and rectal pain.  Genitourinary: Negative.     Allergies as of 02/17/2017   No Known Allergies     Medication List        Accurate as of 02/17/17 11:59 PM. Always use your most recent med list.          atenolol 25 MG tablet Commonly known as:  TENORMIN Take 1 tablet (25 mg total) by mouth daily.   Biotin 5000 MCG Caps Take by mouth.   CALCIUM + D PO 1 tab po qd   diltiazem 240 MG 24 hr capsule Commonly known as:  CARDIZEM  CD TAKE ONE CAPSULE BY MOUTH EVERY DAY   flecainide 50 MG tablet Commonly known as:  TAMBOCOR TAKE 1 TABLET BY MOUTH TWO TIMES DAILY   lactulose 20 g packet Commonly known as:  CEPHULAC Take 1 packet (20 g total) by mouth 2 (two) times daily.   latanoprost 0.005 % ophthalmic solution Commonly known as:  XALATAN Place 1 drop into both eyes daily.   levothyroxine 50 MCG tablet Commonly known as:  SYNTHROID, LEVOTHROID TAKE 1 TABLET (50 MCG TOTAL) BY MOUTH DAILY.   losartan 100 MG tablet Commonly known as:  COZAAR Take 1 tablet (100 mg total) by mouth daily.   meloxicam 15 MG tablet Commonly known as:  MOBIC Take 1 tablet (15 mg total) by mouth as needed.   Rivaroxaban 15 MG Tabs tablet Commonly known as:  XARELTO TAKE 1 TABLET BY MOUTH EVERY DAY WITH SUPPER          Objective:    BP 138/89   Pulse 68   Temp 97.9 F (36.6 C) (Oral)   Ht 5\' 7"  (1.702 m)   Wt 137 lb (62.1 kg)  LMP  (LMP Unknown)   BMI 21.46 kg/m   No Known Allergies  Physical Exam  Constitutional: She is oriented to person, place, and time. She appears well-developed and well-nourished.  HENT:  Head: Normocephalic and atraumatic.  Right Ear: Tympanic membrane, external ear and ear canal normal.  Left Ear: Tympanic membrane, external ear and ear canal normal.  Nose: Nose normal. No rhinorrhea.  Mouth/Throat: Oropharynx is clear and moist and mucous membranes are normal. No oropharyngeal exudate or posterior oropharyngeal erythema.  Eyes: Conjunctivae and EOM are normal. Pupils are equal, round, and reactive to light.  Neck: Normal range of motion. Neck supple.  Cardiovascular: Normal rate, regular rhythm, normal heart sounds and intact distal pulses.  Pulmonary/Chest: Effort normal and breath sounds normal.  Abdominal: Soft. Bowel sounds are normal. She exhibits no distension and no mass. There is tenderness. There is no rebound and no guarding.  Neurological: She is alert and oriented to  person, place, and time. She has normal reflexes.  Skin: Skin is warm and dry. No rash noted.  Psychiatric: She has a normal mood and affect. Her behavior is normal. Judgment and thought content normal.    Results for orders placed or performed in visit on 00/86/76  Basic Metabolic Panel (BMET)  Result Value Ref Range   Glucose 54 (L) 65 - 99 mg/dL   BUN 27 8 - 27 mg/dL   Creatinine, Ser 1.66 (H) 0.57 - 1.00 mg/dL   GFR calc non Af Amer 29 (L) >59 mL/min/1.73   GFR calc Af Amer 34 (L) >59 mL/min/1.73   BUN/Creatinine Ratio 16 12 - 28   Sodium 139 134 - 144 mmol/L   Potassium 4.5 3.5 - 5.2 mmol/L   Chloride 101 96 - 106 mmol/L   CO2 22 20 - 29 mmol/L   Calcium 9.7 8.7 - 10.3 mg/dL      Assessment & Plan:   1. Chronic idiopathic constipation - lactulose (CEPHULAC) 20 g packet; Take 1 packet (20 g total) by mouth 2 (two) times daily.  Dispense: 60 each; Refill: 5    Current Outpatient Medications:  .  atenolol (TENORMIN) 25 MG tablet, Take 1 tablet (25 mg total) by mouth daily., Disp: , Rfl:  .  Biotin 5000 MCG CAPS, Take by mouth., Disp: , Rfl:  .  Calcium Carbonate-Vitamin D (CALCIUM + D PO), 1 tab po qd , Disp: , Rfl:  .  diltiazem (CARDIZEM CD) 240 MG 24 hr capsule, TAKE ONE CAPSULE BY MOUTH EVERY DAY, Disp: 90 capsule, Rfl: 2 .  flecainide (TAMBOCOR) 50 MG tablet, TAKE 1 TABLET BY MOUTH TWO TIMES DAILY, Disp: 180 tablet, Rfl: 1 .  lactulose (CEPHULAC) 20 g packet, Take 1 packet (20 g total) by mouth 2 (two) times daily., Disp: 60 each, Rfl: 5 .  latanoprost (XALATAN) 0.005 % ophthalmic solution, Place 1 drop into both eyes daily., Disp: , Rfl: 4 .  levothyroxine (SYNTHROID, LEVOTHROID) 50 MCG tablet, TAKE 1 TABLET (50 MCG TOTAL) BY MOUTH DAILY., Disp: 90 tablet, Rfl: 0 .  losartan (COZAAR) 100 MG tablet, Take 1 tablet (100 mg total) by mouth daily., Disp: 30 tablet, Rfl: 1 .  meloxicam (MOBIC) 15 MG tablet, Take 1 tablet (15 mg total) by mouth as needed., Disp: 90 tablet, Rfl:  2 .  Rivaroxaban (XARELTO) 15 MG TABS tablet, TAKE 1 TABLET BY MOUTH EVERY DAY WITH SUPPER (Patient taking differently: 15 mg daily with supper. TAKE 1 TABLET BY MOUTH EVERY DAY WITH SUPPER), Disp:  30 tablet, Rfl: 5 Continue all other maintenance medications as listed above.  Follow up plan: Return if symptoms worsen or fail to improve.  Educational handout given for chronic constipation  [name] PA-C [facility] [address]  [place], [place] [address] [phone]   02/18/2017, 1:53 PM

## 2017-02-19 ENCOUNTER — Encounter: Payer: Self-pay | Admitting: Physician Assistant

## 2017-02-20 ENCOUNTER — Ambulatory Visit: Payer: Medicare Other | Admitting: Internal Medicine

## 2017-02-20 ENCOUNTER — Encounter: Payer: Self-pay | Admitting: Internal Medicine

## 2017-02-20 ENCOUNTER — Other Ambulatory Visit: Payer: Medicare Other | Admitting: *Deleted

## 2017-02-20 VITALS — BP 172/78 | HR 61 | Ht 67.0 in | Wt 133.0 lb

## 2017-02-20 DIAGNOSIS — I10 Essential (primary) hypertension: Secondary | ICD-10-CM | POA: Diagnosis not present

## 2017-02-20 DIAGNOSIS — I480 Paroxysmal atrial fibrillation: Secondary | ICD-10-CM | POA: Diagnosis not present

## 2017-02-20 MED ORDER — SPIRONOLACTONE 25 MG PO TABS: 12.5000 mg | ORAL_TABLET | Freq: Every day | ORAL | 6 refills | Status: DC

## 2017-02-20 NOTE — Progress Notes (Signed)
Cardiology Office Note   Date:  02/20/2017   ID:  Amy [name], DOB 02/16/1935, MRN [id number]  PCP:  [name], PA-C  Cardiologist:   Dorris Carnes, MD    F/U of HTN and PAF     History of Present Illness: Amy [name] is a 82 y.o. female with a history of atrial flutter (s/p ablation), PAF and HTN  I saw her in July 2018 She has been seen in the interval in HTN clinic by M Supple and K Auten  BP has been labile  Initially she was taking BP meds as needed  Now taking regularly  Meds and timing adjusted  On last clinic visit with K Auten bp was bettter  Pt's breathing is good   No CP  No palpitations      Outpatient Medications Prior to Visit  Medication Sig Dispense Refill  . atenolol (TENORMIN) 25 MG tablet Take 1 tablet (25 mg total) by mouth daily.    . Biotin 5000 MCG CAPS Take by mouth.    . Calcium Carbonate-Vitamin D (CALCIUM + D PO) 1 tab po qd     . diltiazem (CARDIZEM CD) 240 MG 24 hr capsule TAKE ONE CAPSULE BY MOUTH EVERY DAY 90 capsule 2  . flecainide (TAMBOCOR) 50 MG tablet TAKE 1 TABLET BY MOUTH TWO TIMES DAILY 180 tablet 1  . lactulose (CEPHULAC) 20 g packet Take 1 packet (20 g total) by mouth 2 (two) times daily. 60 each 5  . latanoprost (XALATAN) 0.005 % ophthalmic solution Place 1 drop into both eyes daily.  4  . levothyroxine (SYNTHROID, LEVOTHROID) 50 MCG tablet TAKE 1 TABLET (50 MCG TOTAL) BY MOUTH DAILY. 90 tablet 0  . losartan (COZAAR) 100 MG tablet Take 1 tablet (100 mg total) by mouth daily. 30 tablet 1  . meloxicam (MOBIC) 15 MG tablet Take 1 tablet (15 mg total) by mouth as needed. 90 tablet 2  . Rivaroxaban (XARELTO) 15 MG TABS tablet TAKE 1 TABLET BY MOUTH EVERY DAY WITH SUPPER (Patient taking differently: 15 mg daily with supper. TAKE 1 TABLET BY MOUTH EVERY DAY WITH SUPPER) 30 tablet 5   No facility-administered medications prior to visit.      Allergies:   Patient has no known allergies.   Past Medical History:  Diagnosis Date  .  Anemia   . Arthritis   . Atrial flutter ([place])    s/p ablation  . HTN (hypertension)   . Palpitations     Past Surgical History:  Procedure Laterality Date  . A FLUTTER ABLATION    . KNEE ARTHROSCOPY    . TUBAL LIGATION       Social History:  The patient  reports that  has never smoked. she has never used smokeless tobacco. She reports that she does not drink alcohol or use drugs.   Family History:  The patient's family history includes Colon cancer in her brother; Heart disease in her mother; Stroke (age of onset: 57) in her mother; Stroke (age of onset: 65) in her father.    ROS:  Please see the history of present illness. All other systems are reviewed and  Negative to the above problem except as noted.    PHYSICAL EXAM: VS:  BP (!) 172/78   Pulse 61   Ht 5\' 7"  (1.702 m)   Wt 133 lb (60.3 kg)   LMP  (LMP Unknown)   SpO2 99%   BMI 20.83 kg/m  GEN: Thin 82 yo , in no acute distress  HEENT: normal  Neck: no JVD, carotid bruits, or masses Cardiac: RRR; no murmurs, rubs, or gallops,no edema  Respiratory:  clear to auscultation bilaterally, normal work of breathing GI: soft, nontender, nondistended, + BS  No hepatomegaly  MS: no deformity Moving all extremities   Skin: warm and dry, no rash Neuro:  Strength and sensation are intact Psych: euthymic mood, full affect   EKG:  EKG is not ordered today.   Lipid Panel    Component Value Date/Time   CHOL 205 (H) 07/29/2016 0920   TRIG 55 07/29/2016 0920   HDL 112 07/29/2016 0920   CHOLHDL 1.8 07/29/2016 0920   CHOLHDL 2.0 06/05/2015 0946   VLDL 10 06/05/2015 0946   LDLCALC 82 07/29/2016 0920   LDLDIRECT 84.9 11/15/2008 0924      Wt Readings from Last 3 Encounters:  02/20/17 133 lb (60.3 kg)  02/17/17 137 lb (62.1 kg)  01/09/17 131 lb 1.9 oz (59.5 kg)      ASSESSMENT AND PLAN:  1  HTN  BP remains high today  Reviewed with M Supple  Would continue current meds and add 12.5 mg aldactone.  F/U with BMET and  for BP check in a couple wks     2  PAF  Continue on current regimen   F/U in HTN clinic in a couple wkts    Tentaive f/u with me in 6 months      Current medicines are reviewed at length with the patient today.  The patient does not have concerns regarding medicines.  Signed, Dorris Carnes, MD  02/20/2017 11:51 AM    [place], [place], [place]  [address] Phone: [phone]; Fax: [phone]

## 2017-02-20 NOTE — Patient Instructions (Addendum)
Your physician has recommended you make the following change in your medication:  1.) start spironolactone (aldactone) 25 mg--take 1/2 tablet once daily  Your physician recommends that you return for lab work today Artist)  Your physician recommends that you schedule a follow-up appointment in: 2 weeks with Hypertension Clinic with PharmD.  Your physician wants you to follow-up in: 6 months with Dr. Harrington Challenger.  You will receive a reminder letter in the mail two months in advance. If you don't receive a letter, please call our office to schedule the follow-up appointment.

## 2017-02-21 LAB — BASIC METABOLIC PANEL
BUN / CREAT RATIO: 14 (ref 12–28)
BUN: 19 mg/dL (ref 8–27)
CO2: 22 mmol/L (ref 20–29)
CREATININE: 1.36 mg/dL — AB (ref 0.57–1.00)
Calcium: 10.1 mg/dL (ref 8.7–10.3)
Chloride: 103 mmol/L (ref 96–106)
GFR calc Af Amer: 43 mL/min/{1.73_m2} — ABNORMAL LOW (ref >59)
GFR, EST NON AFRICAN AMERICAN: 37 mL/min/{1.73_m2} — AB (ref >59)
GLUCOSE: 94 mg/dL (ref 65–99)
Potassium: 4.6 mmol/L (ref 3.5–5.2)
Sodium: 143 mmol/L (ref 134–144)

## 2017-03-03 ENCOUNTER — Ambulatory Visit: Payer: Medicare Other | Admitting: *Deleted

## 2017-03-06 ENCOUNTER — Ambulatory Visit (INDEPENDENT_AMBULATORY_CARE_PROVIDER_SITE_OTHER): Payer: Medicare Other | Admitting: Pharmacist

## 2017-03-06 VITALS — BP 140/62 | HR 52

## 2017-03-06 DIAGNOSIS — I10 Essential (primary) hypertension: Secondary | ICD-10-CM | POA: Diagnosis not present

## 2017-03-06 NOTE — Progress Notes (Signed)
Patient ID: Amy [name]                 DOB: 1935-09-02                      MRN: [id number]     HPI: Amy [name] is a pleasant 82 y.o. female referred by Dr. Harrington Challenger to HTN clinic. PMH is significant for atrial flutter s/p ablation and HTN. BP readings have typically ranged 130s/50-60s, however BP was elevated at 178/72 at recent nurse visit on 12/20. Pt called clinic a few days later with reports of elevated BP before taking her medications. After HTN medication administration, SBP improved to 120-140s. Pt was advised to change atenolol from prn to daily. Pt remained concerned about fluctuating BP readings throughout the day. Previously she reported that her mother had a stroke and pt is fearful of this happening to her. She does not use her meloxicam at all. She has been using a CBD sublingual oil that has been helping with her arthritic pain. Since her last HTN clinic visit she has been seen by Dr. Harrington Challenger and she was started on spironolactone 12.5mg  daily.   Pt presents today for follow up. She reports she is doing well, but has not really noticed a difference with her blood pressures. She denies chest pain, SOB, and dizziness. Based on her log her pressures are controlled after morning doses of medications (BP 130Q-657Q systolic) until about 469-6EX. Around this time the pressures slowly creep up to 528U systolic.   Current HTN meds: atenolol 25mg  daily (PM), losartan 100mg  daily (lunch), diltiazem 240mg  daily 9 AM), spironolactone 12.5mg  daily  BP goal: <140/10mmHg due to age  Family History: Mother had history of stroke.  Social History: Denies tobacco, alcohol, and illicit drug use.  Diet: No caffeine. Eats out more than cooking at home. Likes [employer] and Janine Limbo.  Exercise: Walks frequently   Home BP readings: Home HR 60s consistently.   Wt Readings from Last 3 Encounters:  02/20/17 133 lb (60.3 kg)  02/17/17 137 lb (62.1 kg)  01/09/17 131 lb 1.9 oz (59.5 kg)   BP Readings from Last  3 Encounters:  02/20/17 (!) 172/78  02/17/17 138/89  02/05/17 138/64   Pulse Readings from Last 3 Encounters:  02/20/17 61  02/17/17 68  02/05/17 (!) 56    Renal function: Estimated Creatinine Clearance: 31.9 mL/min (A) (by C-G formula based on SCr of 1.36 mg/dL (H)).  Past Medical History:  Diagnosis Date  . Anemia   . Arthritis   . Atrial flutter ([place])    s/p ablation  . HTN (hypertension)   . Palpitations     Current Outpatient Medications on File Prior to Visit  Medication Sig Dispense Refill  . atenolol (TENORMIN) 25 MG tablet Take 1 tablet (25 mg total) by mouth daily.    . Biotin 5000 MCG CAPS Take by mouth.    . Calcium Carbonate-Vitamin D (CALCIUM + D PO) 1 tab po qd     . diltiazem (CARDIZEM CD) 240 MG 24 hr capsule TAKE ONE CAPSULE BY MOUTH EVERY DAY 90 capsule 2  . flecainide (TAMBOCOR) 50 MG tablet TAKE 1 TABLET BY MOUTH TWO TIMES DAILY 180 tablet 1  . lactulose (CEPHULAC) 20 g packet Take 1 packet (20 g total) by mouth 2 (two) times daily. 60 each 5  . latanoprost (XALATAN) 0.005 % ophthalmic solution Place 1 drop into both eyes daily.  4  . levothyroxine (  SYNTHROID, LEVOTHROID) 50 MCG tablet TAKE 1 TABLET (50 MCG TOTAL) BY MOUTH DAILY. 90 tablet 0  . losartan (COZAAR) 100 MG tablet Take 1 tablet (100 mg total) by mouth daily. 30 tablet 1  . meloxicam (MOBIC) 15 MG tablet Take 1 tablet (15 mg total) by mouth as needed. 90 tablet 2  . Rivaroxaban (XARELTO) 15 MG TABS tablet TAKE 1 TABLET BY MOUTH EVERY DAY WITH SUPPER (Patient taking differently: 15 mg daily with supper. TAKE 1 TABLET BY MOUTH EVERY DAY WITH SUPPER) 30 tablet 5  . spironolactone (ALDACTONE) 25 MG tablet Take 0.5 tablets (12.5 mg total) by mouth daily. 30 tablet 6  . [DISCONTINUED] estrogen, conjugated,-medroxyprogesterone (PREMPRO) 0.3-1.5 MG per tablet Take 1 tablet by mouth 2 (two) times a week.     . [DISCONTINUED] warfarin (COUMADIN) 5 MG tablet Take as directed by Anticoagulation clinic 35  tablet 3   No current facility-administered medications on file prior to visit.     No Known Allergies    Assessment/Plan:  1. Hypertension - repeat BMET today. Since pressures are consistently controlled after morning medications, and she states this was so prior to starting spironolactone, will adjust timing of medication to evening to help with evening pressures. Continue to monitor pressures and follow up in HTN clinic in 2-3 weeks.    Thank you, Lelan Pons. Patterson Hammersmith, [place]  [address], [place], [place] [address]  Phone: [phone]; Fax: [phone] 03/06/2017 7:17 AM

## 2017-03-06 NOTE — Patient Instructions (Signed)
Return for a follow up appointment in 2-3 weeks  Check your blood pressure at home daily (if able) and keep record of the readings.  Take your BP meds as follows: START taking spironolactone with your diltiazem to control evening pressures   Bring all of your meds, your BP cuff and your record of home blood pressures to your next appointment.  Exercise as you're able, try to walk approximately 30 minutes per day.  Keep salt intake to a minimum, especially watch canned and prepared boxed foods.  Eat more fresh fruits and vegetables and fewer canned items.  Avoid eating in fast food restaurants.    HOW TO TAKE YOUR BLOOD PRESSURE: . Rest 5 minutes before taking your blood pressure. .  Don't smoke or drink caffeinated beverages for at least 30 minutes before. . Take your blood pressure before (not after) you eat. . Sit comfortably with your back supported and both feet on the floor (don't cross your legs). . Elevate your arm to heart level on a table or a desk. . Use the proper sized cuff. It should fit smoothly and snugly around your bare upper arm. There should be enough room to slip a fingertip under the cuff. The bottom edge of the cuff should be 1 inch above the crease of the elbow. . Ideally, take 3 measurements at one sitting and record the average.

## 2017-03-07 ENCOUNTER — Encounter: Payer: Self-pay | Admitting: Pharmacist

## 2017-03-07 LAB — BASIC METABOLIC PANEL
BUN/Creatinine Ratio: 14 (ref 12–28)
BUN: 23 mg/dL (ref 8–27)
CO2: 23 mmol/L (ref 20–29)
Calcium: 9.5 mg/dL (ref 8.7–10.3)
Chloride: 101 mmol/L (ref 96–106)
Creatinine, Ser: 1.61 mg/dL — ABNORMAL HIGH (ref 0.57–1.00)
GFR calc Af Amer: 35 mL/min/{1.73_m2} — ABNORMAL LOW (ref >59)
GFR calc non Af Amer: 30 mL/min/{1.73_m2} — ABNORMAL LOW (ref >59)
GLUCOSE: 83 mg/dL (ref 65–99)
POTASSIUM: 5.2 mmol/L (ref 3.5–5.2)
SODIUM: 140 mmol/L (ref 134–144)

## 2017-03-10 ENCOUNTER — Other Ambulatory Visit: Payer: Self-pay

## 2017-03-10 ENCOUNTER — Encounter: Payer: Self-pay | Admitting: Internal Medicine

## 2017-03-10 MED ORDER — LOSARTAN POTASSIUM 100 MG PO TABS: 100.0000 mg | ORAL_TABLET | Freq: Every day | ORAL | 10 refills | Status: DC

## 2017-03-11 ENCOUNTER — Other Ambulatory Visit: Payer: Self-pay | Admitting: Pharmacist

## 2017-03-11 DIAGNOSIS — I10 Essential (primary) hypertension: Secondary | ICD-10-CM

## 2017-03-11 MED ORDER — LOSARTAN POTASSIUM 100 MG PO TABS: 100.0000 mg | ORAL_TABLET | Freq: Every day | ORAL | 10 refills | Status: DC

## 2017-03-20 ENCOUNTER — Other Ambulatory Visit: Payer: Medicare Other | Admitting: *Deleted

## 2017-03-20 ENCOUNTER — Ambulatory Visit: Payer: Medicare Other | Admitting: Family Medicine

## 2017-03-20 DIAGNOSIS — I10 Essential (primary) hypertension: Secondary | ICD-10-CM

## 2017-03-21 LAB — BASIC METABOLIC PANEL
BUN / CREAT RATIO: 19 (ref 12–28)
BUN: 30 mg/dL — AB (ref 8–27)
CHLORIDE: 103 mmol/L (ref 96–106)
CO2: 22 mmol/L (ref 20–29)
CREATININE: 1.62 mg/dL — AB (ref 0.57–1.00)
Calcium: 9.9 mg/dL (ref 8.7–10.3)
GFR calc Af Amer: 35 mL/min/{1.73_m2} — ABNORMAL LOW (ref >59)
GFR calc non Af Amer: 30 mL/min/{1.73_m2} — ABNORMAL LOW (ref >59)
GLUCOSE: 108 mg/dL — AB (ref 65–99)
Potassium: 5.1 mmol/L (ref 3.5–5.2)
Sodium: 142 mmol/L (ref 134–144)

## 2017-03-27 ENCOUNTER — Ambulatory Visit: Payer: Medicare Other

## 2017-03-28 ENCOUNTER — Encounter: Payer: Self-pay | Admitting: Pharmacist

## 2017-03-28 ENCOUNTER — Ambulatory Visit (INDEPENDENT_AMBULATORY_CARE_PROVIDER_SITE_OTHER): Payer: Medicare Other | Admitting: Pharmacist

## 2017-03-28 VITALS — BP 164/76 | HR 79

## 2017-03-28 DIAGNOSIS — I10 Essential (primary) hypertension: Secondary | ICD-10-CM

## 2017-03-28 LAB — BASIC METABOLIC PANEL
BUN/Creatinine Ratio: 10 — ABNORMAL LOW (ref 12–28)
BUN: 17 mg/dL (ref 8–27)
CALCIUM: 10.1 mg/dL (ref 8.7–10.3)
CHLORIDE: 104 mmol/L (ref 96–106)
CO2: 25 mmol/L (ref 20–29)
Creatinine, Ser: 1.74 mg/dL — ABNORMAL HIGH (ref 0.57–1.00)
GFR calc non Af Amer: 27 mL/min/{1.73_m2} — ABNORMAL LOW (ref >59)
GFR, EST AFRICAN AMERICAN: 32 mL/min/{1.73_m2} — AB (ref >59)
Glucose: 99 mg/dL (ref 65–99)
POTASSIUM: 5.4 mmol/L — AB (ref 3.5–5.2)
Sodium: 142 mmol/L (ref 134–144)

## 2017-03-28 MED ORDER — HYDRALAZINE HCL 25 MG PO TABS: 25.0000 mg | ORAL_TABLET | Freq: Three times a day (TID) | ORAL | 3 refills | Status: DC

## 2017-03-28 NOTE — Patient Instructions (Addendum)
START taking hydralazine 25mg  three times a day  ONLY monitor your pressures 3 times a day.    DASH Eating Plan DASH stands for "Dietary Approaches to Stop Hypertension." The DASH eating plan is a healthy eating plan that has been shown to reduce high blood pressure (hypertension). It may also reduce your risk for type 2 diabetes, heart disease, and stroke. The DASH eating plan may also help with weight loss. What are tips for following this plan? General guidelines  Avoid eating more than 2,300 mg (milligrams) of salt (sodium) a day. If you have hypertension, you may need to reduce your sodium intake to 1,500 mg a day.  Limit alcohol intake to no more than 1 drink a day for nonpregnant women and 2 drinks a day for men. One drink equals 12 oz of beer, 5 oz of wine, or 1 oz of hard liquor.  Work with your health care provider to maintain a healthy body weight or to lose weight. Ask what an ideal weight is for you.  Get at least 30 minutes of exercise that causes your heart to beat faster (aerobic exercise) most days of the week. Activities may include walking, swimming, or biking.  Work with your health care provider or diet and nutrition specialist (dietitian) to adjust your eating plan to your individual calorie needs. Reading food labels  Check food labels for the amount of sodium per serving. Choose foods with less than 5 percent of the Daily Value of sodium. Generally, foods with less than 300 mg of sodium per serving fit into this eating plan.  To find whole grains, look for the word "whole" as the first word in the ingredient list. Shopping  Buy products labeled as "low-sodium" or "no salt added."  Buy fresh foods. Avoid canned foods and premade or frozen meals. Cooking  Avoid adding salt when cooking. Use salt-free seasonings or herbs instead of table salt or sea salt. Check with your health care provider or pharmacist before using salt substitutes.  Do not fry foods. Cook  foods using healthy methods such as baking, boiling, grilling, and broiling instead.  Cook with heart-healthy oils, such as olive, canola, soybean, or sunflower oil. Meal planning   Eat a balanced diet that includes: ? 5 or more servings of fruits and vegetables each day. At each meal, try to fill half of your plate with fruits and vegetables. ? Up to 6-8 servings of whole grains each day. ? Less than 6 oz of lean meat, poultry, or fish each day. A 3-oz serving of meat is about the same size as a deck of cards. One egg equals 1 oz. ? 2 servings of low-fat dairy each day. ? A serving of nuts, seeds, or beans 5 times each week. ? Heart-healthy fats. Healthy fats called Omega-3 fatty acids are found in foods such as flaxseeds and coldwater fish, like sardines, salmon, and mackerel.  Limit how much you eat of the following: ? Canned or prepackaged foods. ? Food that is high in trans fat, such as fried foods. ? Food that is high in saturated fat, such as fatty meat. ? Sweets, desserts, sugary drinks, and other foods with added sugar. ? Full-fat dairy products.  Do not salt foods before eating.  Try to eat at least 2 vegetarian meals each week.  Eat more home-cooked food and less restaurant, buffet, and fast food.  When eating at a restaurant, ask that your food be prepared with less salt or no salt, if  possible. What foods are recommended? The items listed may not be a complete list. Talk with your dietitian about what dietary choices are best for you. Grains Whole-grain or whole-wheat bread. Whole-grain or whole-wheat pasta. Brown rice. [place]. Bulgur. Whole-grain and low-sodium cereals. Pita bread. Low-fat, low-sodium crackers. Whole-wheat flour tortillas. Vegetables Fresh or frozen vegetables (raw, steamed, roasted, or grilled). Low-sodium or reduced-sodium tomato and vegetable juice. Low-sodium or reduced-sodium tomato sauce and tomato paste. Low-sodium or reduced-sodium  canned vegetables. Fruits All fresh, dried, or frozen fruit. Canned fruit in natural juice (without added sugar). Meat and other protein foods Skinless chicken or [place]. Ground chicken or [place]. Pork with fat trimmed off. Fish and seafood. Egg whites. Dried beans, peas, or lentils. Unsalted nuts, nut butters, and seeds. Unsalted canned beans. Lean cuts of beef with fat trimmed off. Low-sodium, lean deli meat. Dairy Low-fat (1%) or fat-free (skim) milk. Fat-free, low-fat, or reduced-fat cheeses. Nonfat, low-sodium ricotta or cottage cheese. Low-fat or nonfat yogurt. Low-fat, low-sodium cheese. Fats and oils Soft margarine without trans fats. Vegetable oil. Low-fat, reduced-fat, or light mayonnaise and salad dressings (reduced-sodium). Canola, safflower, olive, soybean, and sunflower oils. Avocado. Seasoning and other foods Herbs. Spices. Seasoning mixes without salt. Unsalted popcorn and pretzels. Fat-free sweets. What foods are not recommended? The items listed may not be a complete list. Talk with your dietitian about what dietary choices are best for you. Grains Baked goods made with fat, such as croissants, muffins, or some breads. Dry pasta or rice meal packs. Vegetables Creamed or fried vegetables. Vegetables in a cheese sauce. Regular canned vegetables (not low-sodium or reduced-sodium). Regular canned tomato sauce and paste (not low-sodium or reduced-sodium). Regular tomato and vegetable juice (not low-sodium or reduced-sodium). Angie Fava. Olives. Fruits Canned fruit in a light or heavy syrup. Fried fruit. Fruit in cream or butter sauce. Meat and other protein foods Fatty cuts of meat. Ribs. Fried meat. Berniece Salines. Sausage. Bologna and other processed lunch meats. Salami. Fatback. Hotdogs. Bratwurst. Salted nuts and seeds. Canned beans with added salt. Canned or smoked fish. Whole eggs or egg yolks. Chicken or [place] with skin. Dairy Whole or 2% milk, cream, and half-and-half. Whole or  full-fat cream cheese. Whole-fat or sweetened yogurt. Full-fat cheese. Nondairy creamers. Whipped toppings. Processed cheese and cheese spreads. Fats and oils Butter. Stick margarine. Lard. Shortening. Ghee. Bacon fat. Tropical oils, such as coconut, palm kernel, or palm oil. Seasoning and other foods Salted popcorn and pretzels. Onion salt, garlic salt, seasoned salt, table salt, and sea salt. Worcestershire sauce. Tartar sauce. Barbecue sauce. Teriyaki sauce. Soy sauce, including reduced-sodium. Steak sauce. Canned and packaged gravies. Fish sauce. Oyster sauce. Cocktail sauce. Horseradish that you find on the shelf. Ketchup. Mustard. Meat flavorings and tenderizers. Bouillon cubes. Hot sauce and Tabasco sauce. Premade or packaged marinades. Premade or packaged taco seasonings. Relishes. Regular salad dressings. Where to find more information:  National Heart, Lung, and [place]: https://wilson-eaton.com/  American Heart Association: www.heart.org Summary  The DASH eating plan is a healthy eating plan that has been shown to reduce high blood pressure (hypertension). It may also reduce your risk for type 2 diabetes, heart disease, and stroke.  With the DASH eating plan, you should limit salt (sodium) intake to 2,300 mg a day. If you have hypertension, you may need to reduce your sodium intake to 1,500 mg a day.  When on the DASH eating plan, aim to eat more fresh fruits and vegetables, whole grains, lean proteins, low-fat dairy, and heart-healthy fats.  Work with your health care provider or diet and nutrition specialist (dietitian) to adjust your eating plan to your individual calorie needs. This information is not intended to replace advice given to you by your health care provider. Make sure you discuss any questions you have with your health care provider. Document Released: 12/27/2010 Document Revised: 01/01/2016 Document Reviewed: 01/01/2016 Elsevier Interactive Patient Education  [employer].

## 2017-03-28 NOTE — Progress Notes (Signed)
Patient ID: Amy [name]                 DOB: 1935-10-26                      MRN: [id number]     HPI: Amy [name] is a pleasant 82 y.o. female referred by Dr. Harrington Challenger to HTN clinic. PMH is significant for atrial flutter s/p ablation and HTN. BP readings have typically ranged 130s/50-60s, however BP was elevated at 178/72 at recent nurse visit on 12/20. Pt called clinic a few days later with reports of elevated BP before taking her medications. After HTN medication administration, SBP improved to 120-140s. Pt was advised to change atenolol from prn to daily. Pt remained concerned about fluctuating BP readings throughout the day. Previously she reported that her mother had a stroke and pt is fearful of this happening to her. She does not use her meloxicam at all. She has been using a CBD sublingual oil that has been helping with her arthritic pain. She has had several BMET since she was started on spironolactone and both revealed that she was slightly dry. We have discussed changing to hyrdralazine if she remains dry today.   Pt presents today for follow up with her husband. She reports that she has been drinking water religiously since we last spoke. She also bring a list of questions with her to her visit today.   Current HTN meds: atenolol 25mg  daily (PM), losartan 100mg  daily (lunch), diltiazem 240mg  daily 9 AM), spironolactone 12.5mg  daily  BP goal: <140/22mmHg due to age  Family History: Mother had history of stroke.  Social History: Denies tobacco, alcohol, and illicit drug use.  Diet: No caffeine. Eats out more than cooking at home. Likes [employer] and Janine Limbo.  Exercise: Walks frequently   Home BP readings: 110s-180s/70s - mostly 150s/70s  Wt Readings from Last 3 Encounters:  02/20/17 133 lb (60.3 kg)  02/17/17 137 lb (62.1 kg)  01/09/17 131 lb 1.9 oz (59.5 kg)   BP Readings from Last 3 Encounters:  03/28/17 (!) 164/76  03/06/17 140/62  02/20/17 (!) 172/78   Pulse Readings from  Last 3 Encounters:  03/28/17 79  03/06/17 (!) 52  02/20/17 61    Renal function: CrCl cannot be calculated (Unknown ideal weight.).  Past Medical History:  Diagnosis Date  . Anemia   . Arthritis   . Atrial flutter ([place])    s/p ablation  . HTN (hypertension)   . Palpitations     Current Outpatient Medications on File Prior to Visit  Medication Sig Dispense Refill  . atenolol (TENORMIN) 25 MG tablet Take 1 tablet (25 mg total) by mouth daily.    . Biotin 5000 MCG CAPS Take by mouth.    . Calcium Carbonate-Vitamin D (CALCIUM + D PO) 1 tab po qd     . diltiazem (CARDIZEM CD) 240 MG 24 hr capsule TAKE ONE CAPSULE BY MOUTH EVERY DAY 90 capsule 2  . flecainide (TAMBOCOR) 50 MG tablet TAKE 1 TABLET BY MOUTH TWO TIMES DAILY 180 tablet 1  . lactulose (CEPHULAC) 20 g packet Take 1 packet (20 g total) by mouth 2 (two) times daily. 60 each 5  . latanoprost (XALATAN) 0.005 % ophthalmic solution Place 1 drop into both eyes daily.  4  . levothyroxine (SYNTHROID, LEVOTHROID) 50 MCG tablet TAKE 1 TABLET (50 MCG TOTAL) BY MOUTH DAILY. 90 tablet 0  . losartan (COZAAR) 100 MG tablet Take  1 tablet (100 mg total) by mouth daily. 30 tablet 10  . meloxicam (MOBIC) 15 MG tablet Take 1 tablet (15 mg total) by mouth as needed. (Patient not taking: Reported on 03/06/2017) 90 tablet 2  . Rivaroxaban (XARELTO) 15 MG TABS tablet TAKE 1 TABLET BY MOUTH EVERY DAY WITH SUPPER (Patient taking differently: 15 mg daily with supper. TAKE 1 TABLET BY MOUTH EVERY DAY WITH SUPPER) 30 tablet 5  . spironolactone (ALDACTONE) 25 MG tablet Take 0.5 tablets (12.5 mg total) by mouth daily. 30 tablet 6  . [DISCONTINUED] estrogen, conjugated,-medroxyprogesterone (PREMPRO) 0.3-1.5 MG per tablet Take 1 tablet by mouth 2 (two) times a week.     . [DISCONTINUED] warfarin (COUMADIN) 5 MG tablet Take as directed by Anticoagulation clinic 35 tablet 3   No current facility-administered medications on file prior to visit.     No Known  Allergies    Assessment/Plan:  1. Hypertension - repeat BMET today. Since pressures remain elevated will start hydralazine 25mg  TID. We will not titrate spironolactone at this time due to her being dry on her last 2 panels. If dry today will discontinue spironolactone. Pt in agreement with plan. Also advised to decrease the number of times per day that she monitors as this creates anxiety and increases her pressures. She was told to monitor 3 times a day and follow up in 2-3 weeks.    Thank you, Lelan Pons. Patterson Hammersmith, [place]  [address], [place], [place] [address]  Phone: [phone]; Fax: [phone] 03/28/2017 2:04 PM

## 2017-03-31 ENCOUNTER — Telehealth: Payer: Self-pay | Admitting: Internal Medicine

## 2017-03-31 ENCOUNTER — Telehealth: Payer: Self-pay | Admitting: Pharmacist

## 2017-03-31 MED ORDER — LOSARTAN POTASSIUM 100 MG PO TABS: 50.0000 mg | ORAL_TABLET | Freq: Every day | ORAL | 10 refills | Status: DC

## 2017-03-31 NOTE — Telephone Encounter (Signed)
Pt advised to stop spironolactone due to increase in K and SCr on most recent BMET. Will recheck labs at next HTN f/u in 2 weeks.

## 2017-03-31 NOTE — Telephone Encounter (Signed)
Pt calling regarding seeing Amy [name] on Friday and med was changed , BP low this am 106/51 has not taken med yet [phone] -pls advise

## 2017-03-31 NOTE — Telephone Encounter (Signed)
Spoke with Jinny Blossom, PharmD., who reviewed chart and spoke to patient this morning about blood work.  She told patient to stop spironolactone.   Pt was taking that at night. Per Jinny Blossom, PharmD, called patient and advised to decrease losartan to 50 mg daily, and take all morning medicines now.  Most recent BP at 10:45 am was 129/59.  Advised to continue to take meds as scheduled and monitor BPs and stay hydrated.  Call with further concerns. Pt verbalizes understanding and agreement with this plan.

## 2017-04-02 ENCOUNTER — Ambulatory Visit: Payer: Medicare Other | Admitting: Physician Assistant

## 2017-04-02 ENCOUNTER — Encounter: Payer: Self-pay | Admitting: Physician Assistant

## 2017-04-02 ENCOUNTER — Telehealth: Payer: Self-pay | Admitting: Physician Assistant

## 2017-04-02 VITALS — BP 138/52 | HR 64 | Temp 97.0°F | Ht 67.0 in | Wt 139.6 lb

## 2017-04-02 DIAGNOSIS — K5904 Chronic idiopathic constipation: Secondary | ICD-10-CM | POA: Diagnosis not present

## 2017-04-02 DIAGNOSIS — Z1211 Encounter for screening for malignant neoplasm of colon: Secondary | ICD-10-CM

## 2017-04-02 MED ORDER — LUBIPROSTONE 8 MCG PO CAPS: 8.0000 ug | ORAL_CAPSULE | Freq: Two times a day (BID) | ORAL | 5 refills | Status: DC

## 2017-04-02 NOTE — Telephone Encounter (Signed)
Patient advised that you know she is on BP medication.

## 2017-04-02 NOTE — Telephone Encounter (Signed)
BP has been good, so continue in taking it.

## 2017-04-03 NOTE — Progress Notes (Signed)
[name] [address], [place] [address] Phone: [phone] Subjective:    I'm seeing this patient by the request  of:    CC: Knee pain  CHY:[id number]  Amy [name] is a 82 y.o. female coming in with complaint of bilateral knee pain. Knee gets stiff. States she thinks her knees are swollen.  Onset- Chronic Location- whole knee  Duration- Worse night  Character- achy Aggravating factors- Going downstairs Reliving factors- Heat Therapies tried-icing. Severity-sometimes severe enough to stop her from daily activities.  7 out of 10     Past Medical History:  Diagnosis Date  . Anemia   . Arthritis   . Atrial flutter ([place])    s/p ablation  . HTN (hypertension)   . Palpitations    Past Surgical History:  Procedure Laterality Date  . A FLUTTER ABLATION    . KNEE ARTHROSCOPY    . TUBAL LIGATION     Social History   Socioeconomic History  . Marital status: Married    Spouse name: None  . Number of children: 1  . Years of education: None  . Highest education level: None  Social Needs  . Financial resource strain: None  . Food insecurity - worry: None  . Food insecurity - inability: None  . Transportation needs - medical: None  . Transportation needs - non-medical: None  Occupational History  . Occupation: Retired  Tobacco Use  . Smoking status: Never Smoker  . Smokeless tobacco: Never Used  Substance and Sexual Activity  . Alcohol use: No  . Drug use: No  . Sexual activity: None  Other Topics Concern  . None  Social History Narrative   Occ caffeine    No Known Allergies Family History  Problem Relation Age of Onset  . Stroke Father 19       died from stroke at age 51  . Stroke Mother 36       died from stroke at age 50  . Heart disease Mother   . Colon cancer Brother      Past medical history, social, surgical and family history all reviewed in electronic medical record.  No pertanent information unless  stated regarding to the chief complaint.   Review of Systems:Review of systems updated and as accurate as of 04/04/17  No headache, visual changes, nausea, vomiting, diarrhea, constipation, dizziness, abdominal pain, skin rash, fevers, chills, night sweats, weight loss, swollen lymph nodes, body aches, joint swelling, muscle aches, chest pain, shortness of breath, mood changes.   Objective  Blood pressure 130/60, pulse (!) 59, height 5\' 7"  (1.702 m), weight 141 lb (64 kg), SpO2 98 %. Systems examined below as of 04/04/17   General: No apparent distress alert and oriented x3 mood and affect normal, dressed appropriately.  HEENT: Pupils equal, extraocular movements intact  Respiratory: Patient's speak in full sentences and does not appear short of breath  Cardiovascular: No lower extremity edema, non tender, no erythema  Skin: Warm dry intact with no signs of infection or rash on extremities or on axial skeleton.  Abdomen: Soft nontender  Neuro: Cranial nerves II through XII are intact, neurovascularly intact in all extremities with 2+ DTRs and 2+ pulses.  Lymph: No lymphadenopathy of posterior or anterior cervical chain or axillae bilaterally.  Gait antalgic gait MSK:  Non tender with full range of motion and good stability and symmetric strength and tone of shoulders, elbows, wrist, hip,  and ankles bilaterally.  Knee: Bilateral  valgus deformity noted. Large thigh to calf ratio.  Tender to palpation over medial and PF joint line.  ROM full in flexion and extension and lower leg rotation. instability with valgus force.  painful patellar compression. Patellar glide with moderate crepitus. Patellar and quadriceps tendons unremarkable. Hamstring and quadriceps strength is normal.  Impression and Recommendations:     This case required medical decision making of moderate complexity.      Note: This dictation was prepared with Dragon dictation along with smaller phrase technology. Any  transcriptional errors that result from this process are unintentional.

## 2017-04-04 ENCOUNTER — Ambulatory Visit: Payer: Medicare Other | Admitting: Family Medicine

## 2017-04-04 ENCOUNTER — Encounter: Payer: Self-pay | Admitting: Family Medicine

## 2017-04-04 ENCOUNTER — Ambulatory Visit: Payer: Self-pay

## 2017-04-04 ENCOUNTER — Ambulatory Visit (INDEPENDENT_AMBULATORY_CARE_PROVIDER_SITE_OTHER)
Admission: RE | Admit: 2017-04-04 | Discharge: 2017-04-04 | Disposition: A | Discharge status: Home / Self Care | Payer: Medicare Other | Source: Ambulatory Visit | Attending: Family Medicine | Admitting: Family Medicine

## 2017-04-04 VITALS — BP 130/60 | HR 59 | Ht 67.0 in | Wt 141.0 lb

## 2017-04-04 DIAGNOSIS — M25562 Pain in left knee: Secondary | ICD-10-CM

## 2017-04-04 DIAGNOSIS — M25561 Pain in right knee: Secondary | ICD-10-CM

## 2017-04-04 DIAGNOSIS — M170 Bilateral primary osteoarthritis of knee: Secondary | ICD-10-CM

## 2017-04-04 NOTE — Patient Instructions (Signed)
Good to see you  Alvera Singh is your friend. Ice 20 minutes 2 times daily. Usually after activity and before bed. Exercises 3 times a week.  pennsaid pinkie amount topically 2 times daily as needed.  Over the counter get  Vitamin D 2000 I daily  Tart cherry extract any dose at night  Spenco orthotics "total support" online would be great  Good shoes with rigid bottom.  Jalene Mullet, [place] or New balance greater then 700 See me again in 4 weeks

## 2017-04-04 NOTE — Progress Notes (Signed)
BP (!) 138/52   Pulse 64   Temp (!) 97 F (36.1 C) (Oral)   Ht 5\' 7"  (1.702 m)   Wt 139 lb 9.6 oz (63.3 kg)   LMP  (LMP Unknown)   SpO2 97%   BMI 21.86 kg/m    Subjective:    Patient ID: Amy [name], female    DOB: February 28, 1935, 83 y.o.   MRN: [id number]  HPI: Amy [name] is a 82 y.o. female presenting on 04/02/2017 for Constipation  This has been going on for months, with movements only 1 time per week.  In 2013 the patient had a colonoscopy performed by Dr. Ardis Hughs .  There was one sessile adenoma found with only grade 1 dysplasia.  He is reported to have it repeated in 5 years.  She knows she is due for this, but questions about a virtual colonoscopy.  I am going to give her a call back and talk about the options and off for cologuard.    She has never taken a prescription constipation med. All over the counters have not been effective.  Past Medical History:  Diagnosis Date  . Anemia   . Arthritis   . Atrial flutter ([place])    s/p ablation  . HTN (hypertension)   . Palpitations    Relevant past medical, surgical, family and social history reviewed and updated as indicated. Interim medical history since our last visit reviewed. Allergies and medications reviewed and updated. DATA REVIEWED: CHART IN EPIC  Family History reviewed for pertinent findings.  Review of Systems  Constitutional: Negative.  Negative for activity change, fatigue and fever.  HENT: Negative.   Eyes: Negative.   Respiratory: Negative.  Negative for cough.   Cardiovascular: Negative.  Negative for chest pain.  Gastrointestinal: Positive for abdominal distention, abdominal pain and constipation.  Endocrine: Negative.   Genitourinary: Negative.  Negative for dysuria.  Musculoskeletal: Negative.   Skin: Negative.   Neurological: Negative.     Allergies as of 04/02/2017   No Known Allergies     Medication List        Accurate as of 04/02/17 11:59 PM. Always use your most recent med list.          atenolol 25 MG tablet Commonly known as:  TENORMIN Take 1 tablet (25 mg total) by mouth daily.   Biotin 5000 MCG Caps Take by mouth.   CALCIUM + D PO 1 tab po qd   diltiazem 240 MG 24 hr capsule Commonly known as:  CARDIZEM CD TAKE ONE CAPSULE BY MOUTH EVERY DAY   flecainide 50 MG tablet Commonly known as:  TAMBOCOR TAKE 1 TABLET BY MOUTH TWO TIMES DAILY   hydrALAZINE 25 MG tablet Commonly known as:  APRESOLINE Take 1 tablet (25 mg total) by mouth 3 (three) times daily.   lactulose 20 g packet Commonly known as:  CEPHULAC Take 1 packet (20 g total) by mouth 2 (two) times daily.   latanoprost 0.005 % ophthalmic solution Commonly known as:  XALATAN Place 1 drop into both eyes daily.   levothyroxine 50 MCG tablet Commonly known as:  SYNTHROID, LEVOTHROID TAKE 1 TABLET (50 MCG TOTAL) BY MOUTH DAILY.   losartan 100 MG tablet Commonly known as:  COZAAR Take 0.5 tablets (50 mg total) by mouth daily.   lubiprostone 8 MCG capsule Commonly known as:  AMITIZA Take 1 capsule (8 mcg total) by mouth 2 (two) times daily with a meal.   meloxicam 15 MG tablet Commonly  known as:  MOBIC Take 1 tablet (15 mg total) by mouth as needed.   Rivaroxaban 15 MG Tabs tablet Commonly known as:  XARELTO TAKE 1 TABLET BY MOUTH EVERY DAY WITH SUPPER          Objective:    BP (!) 138/52   Pulse 64   Temp (!) 97 F (36.1 C) (Oral)   Ht 5\' 7"  (1.702 m)   Wt 139 lb 9.6 oz (63.3 kg)   LMP  (LMP Unknown)   SpO2 97%   BMI 21.86 kg/m   No Known Allergies  Wt Readings from Last 3 Encounters:  04/04/17 141 lb (64 kg)  04/02/17 139 lb 9.6 oz (63.3 kg)  02/20/17 133 lb (60.3 kg)    Physical Exam  Constitutional: She is oriented to person, place, and time. She appears well-developed and well-nourished.  HENT:  Head: Normocephalic and atraumatic.  Right Ear: Tympanic membrane, external ear and ear canal normal.  Left Ear: Tympanic membrane, external ear and ear canal  normal.  Nose: Nose normal. No rhinorrhea.  Mouth/Throat: Oropharynx is clear and moist and mucous membranes are normal. No oropharyngeal exudate or posterior oropharyngeal erythema.  Eyes: Conjunctivae and EOM are normal. Pupils are equal, round, and reactive to light.  Neck: Normal range of motion. Neck supple.  Cardiovascular: Normal rate, regular rhythm, normal heart sounds and intact distal pulses.  Pulmonary/Chest: Effort normal and breath sounds normal.  Abdominal: Soft. Bowel sounds are normal.  Neurological: She is alert and oriented to person, place, and time. She has normal reflexes.  Skin: Skin is warm and dry. No rash noted.  Psychiatric: She has a normal mood and affect. Her behavior is normal. Judgment and thought content normal.    Results for orders placed or performed in visit on 97/98/92  Basic Metabolic Panel (BMET)  Result Value Ref Range   Glucose 99 65 - 99 mg/dL   BUN 17 8 - 27 mg/dL   Creatinine, Ser 1.74 (H) 0.57 - 1.00 mg/dL   GFR calc non Af Amer 27 (L) >59 mL/min/1.73   GFR calc Af Amer 32 (L) >59 mL/min/1.73   BUN/Creatinine Ratio 10 (L) 12 - 28   Sodium 142 134 - 144 mmol/L   Potassium 5.4 (H) 3.5 - 5.2 mmol/L   Chloride 104 96 - 106 mmol/L   CO2 25 20 - 29 mmol/L   Calcium 10.1 8.7 - 10.3 mg/dL      Assessment & Plan:   1. Chronic idiopathic constipation - lubiprostone (AMITIZA) 8 MCG capsule; Take 1 capsule (8 mcg total) by mouth 2 (two) times daily with a meal.  Dispense: 60 capsule; Refill: 5  2. Screening for colon cancer Considering virtual colonoscopy at [facility] all other maintenance medications as listed above.  Follow up plan: Return if symptoms worsen or fail to improve.  Educational handout given for [place] PA-C [facility] [address]  [place], [place] [address] [phone]   04/04/2017, 2:15 PM

## 2017-04-04 NOTE — Patient Instructions (Signed)
In a few days you may receive a survey in the mail or online from [employer] regarding your visit with us today. Please take a moment to fill this out. Your feedback is very important to our whole office. It can help us better understand your needs as well as improve your experience and satisfaction. Thank you for taking your time to complete it. We care about you.  Arika Mainer, PA-C  

## 2017-04-04 NOTE — Assessment & Plan Note (Signed)
Degenerative knee bilaterally.  Patient has seen an outside facility with some injections greater than 1 year ago with minimal improvement.  Wants to try conservative therapy.  Discussed icing regimen, home exercise, which activities in which wants to avoid.  Patient is to increase activity as tolerated.  Patient states overall if any worsening pain we will consider potential injections.  Declined formal physical therapy.

## 2017-04-07 ENCOUNTER — Telehealth: Payer: Self-pay | Admitting: Physician Assistant

## 2017-04-08 ENCOUNTER — Ambulatory Visit (INDEPENDENT_AMBULATORY_CARE_PROVIDER_SITE_OTHER): Payer: Medicare Other

## 2017-04-08 ENCOUNTER — Telehealth: Payer: Self-pay | Admitting: Physician Assistant

## 2017-04-08 ENCOUNTER — Ambulatory Visit: Payer: Medicare Other | Admitting: Family

## 2017-04-08 ENCOUNTER — Encounter: Payer: Self-pay | Admitting: Family

## 2017-04-08 ENCOUNTER — Other Ambulatory Visit: Payer: Self-pay | Admitting: Physician Assistant

## 2017-04-08 ENCOUNTER — Telehealth: Payer: Self-pay

## 2017-04-08 ENCOUNTER — Other Ambulatory Visit: Payer: Medicare Other

## 2017-04-08 VITALS — BP 158/65 | HR 57 | Temp 96.8°F | Ht 67.0 in | Wt 140.0 lb

## 2017-04-08 DIAGNOSIS — K5900 Constipation, unspecified: Secondary | ICD-10-CM

## 2017-04-08 DIAGNOSIS — R109 Unspecified abdominal pain: Secondary | ICD-10-CM

## 2017-04-08 DIAGNOSIS — R1030 Lower abdominal pain, unspecified: Secondary | ICD-10-CM

## 2017-04-08 MED ORDER — CIPROFLOXACIN HCL 250 MG PO TABS: 250.0000 mg | ORAL_TABLET | Freq: Two times a day (BID) | ORAL | 0 refills | Status: DC

## 2017-04-08 MED ORDER — LINACLOTIDE 145 MCG PO CAPS: 145.0000 ug | ORAL_CAPSULE | Freq: Every day | ORAL | 5 refills | Status: DC

## 2017-04-08 NOTE — Telephone Encounter (Signed)
Patient aware.

## 2017-04-08 NOTE — Progress Notes (Signed)
   Subjective:    Patient ID: Amy [name], female    DOB: 12/30/1935, 82 y.o.   MRN: [id number]  Pt presents to the office  Flank Pain  This is a new problem. The current episode started today. The problem occurs constantly. The problem is unchanged. Pain location: left flank. The quality of the pain is described as stabbing. The pain is at a severity of 10/10. The pain is moderate. Pertinent negatives include no bladder incontinence, bowel incontinence, dysuria, fever, leg pain, tingling or weakness. The treatment provided mild relief.      Review of Systems  Constitutional: Negative for fever.  Gastrointestinal: Positive for constipation. Negative for bowel incontinence.  Genitourinary: Positive for flank pain. Negative for bladder incontinence and dysuria.  Neurological: Negative for tingling and weakness.  All other systems reviewed and are negative.      Objective:   Physical Exam  Constitutional: She is oriented to person, place, and time. She appears well-developed and well-nourished. No distress.  HENT:  Head: Normocephalic.  Eyes: Pupils are equal, round, and reactive to light.  Neck: Normal range of motion. Neck supple. No thyromegaly present.  Cardiovascular: Normal rate, regular rhythm, normal heart sounds and intact distal pulses.  No murmur heard. Pulmonary/Chest: Effort normal and breath sounds normal. No respiratory distress. She has no wheezes.  Abdominal: Soft. Bowel sounds are normal. She exhibits no distension. There is tenderness (mild lower abd tenderness).  Musculoskeletal: Normal range of motion. She exhibits no edema or tenderness.  Mild left flank pain  Neurological: She is alert and oriented to person, place, and time.  Skin: Skin is warm and dry.  Psychiatric: She has a normal mood and affect. Her behavior is normal. Judgment and thought content normal.  Vitals reviewed.  KUB- Moderate amount of constipation  BP (!) 158/65   Pulse (!) 57   Temp  (!) 96.8 F (36 C) (Oral)   Ht 5\' 7"  (1.702 m)   Wt 140 lb (63.5 kg)   LMP  (LMP Unknown)   BMI 21.93 kg/m      Assessment & Plan:  1. Flank pain - Urinalysis, Complete - DG Abd 1 View; Future  2. Constipation, unspecified constipation type Continue Lactulose  - DG Abd 1 View; Future  3. Lower abdominal pain - DG Abd 1 View; Future  Pt unable to leave urine, pt will bring back sample today IF urine positive will treat, if negative will treat as constipation   Evelina Dun, FNP

## 2017-04-08 NOTE — Telephone Encounter (Signed)
linzess 145 mg one tab daily has been sent to her pharmacy

## 2017-04-08 NOTE — Telephone Encounter (Signed)
UTI, treated with Cipro per Dr. Wendi Snipes- patient aware

## 2017-04-08 NOTE — Patient Instructions (Signed)

## 2017-04-09 LAB — MICROSCOPIC EXAMINATION
Epithelial Cells (non renal): >10 /hpf — AB (ref 0–10)
RBC MICROSCOPIC, UA: NONE SEEN /HPF (ref 0–?)
Renal Epithel, UA: NONE SEEN /hpf

## 2017-04-09 LAB — URINALYSIS, COMPLETE
BILIRUBIN UA: NEGATIVE
Glucose, UA: NEGATIVE
Ketones, UA: NEGATIVE
NITRITE UA: NEGATIVE
PH UA: 7 (ref 5.0–7.5)
Protein, UA: NEGATIVE
RBC UA: NEGATIVE
Specific Gravity, UA: 1.01 (ref 1.005–1.030)
UUROB: 0.2 mg/dL (ref 0.2–1.0)

## 2017-04-09 NOTE — Telephone Encounter (Signed)
Aware. She is taking linzess instead of other medication.

## 2017-04-09 NOTE — Telephone Encounter (Signed)
Please review urine results and advise.

## 2017-04-09 NOTE — Telephone Encounter (Signed)
Aware. 

## 2017-04-09 NOTE — Telephone Encounter (Signed)
It looks like Dr. Wendi Snipes already saw the urine results and sent in ciprofloxacin for her last night.  It did look like she had a mild UTI going on. Caryl Pina, MD [facility] 04/09/2017, 10:20 AM

## 2017-04-10 ENCOUNTER — Other Ambulatory Visit: Payer: Self-pay | Admitting: Family

## 2017-04-10 LAB — URINE CULTURE

## 2017-04-17 NOTE — Progress Notes (Signed)
Patient ID: Amy [name]                 DOB: Jul 11, 1935                      MRN: [id number]     HPI: Amy [name] is a pleasant 82 y.o. female referred by Dr. Harrington Challenger to HTN clinic. PMH is significant for atrial flutter s/p ablation and HTN. Pt remains concerned about fluctuating BP readings throughout the day. Previously she reported that her mother had a stroke and pt is fearful of this happening to her. At her most recent visit in HTN clinic, her BP was elevated at 164/76. She was started on hydralazine 25mg  TID. Her SCr increased to 1.74 and K increased to 5.4 on recent spironolactone which pt was advised to stop taking. Pt called clinic 3 days after that visit with report of low BP to 106/51. She was advised to decrease her losartan to 50mg  daily. She presents today for follow up.  Pt presents today in good spirits with her husband. She continues to check her BP at home at least 7x per day which causes her anxiety about elevated readings. Most readings range 130-170/60-70s with higher readings during recent UTI, after eating BBQ at a restaurant, and when BP is checked at the same time she is due to take her BP medications. She is tolerating her medications well and denies dizziness, blurred vision, falls, or headache. She does notice mild flushing in her cheeks or ears when her BP higher.  She does not use her meloxicam at all. She has been using a CBD sublingual oil that has been helping with her arthritic pain. She wonders if she can have her magnesium checked today since she has not been sleeping well.  Current HTN meds: atenolol 25mg  daily (PM), losartan 50mg  daily (lunch), diltiazem 240mg  daily (AM), hydralazine 25mg  TID Previously tried: spironolactone 12.5mg  daily - increase in SCr and K to 5.4 BP goal: <140/42mmHg due to age  Family History: Mother had history of stroke.  Social History: Denies tobacco, alcohol, and illicit drug use.  Diet: No caffeine. Eats out more than cooking at  home. Likes [employer] and [employer].  Exercise: Walks frequently   Home BP readings: 130-170/60-70s, HR 60-70s at home   Wt Readings from Last 3 Encounters:  04/08/17 140 lb (63.5 kg)  04/04/17 141 lb (64 kg)  04/02/17 139 lb 9.6 oz (63.3 kg)   BP Readings from Last 3 Encounters:  04/08/17 (!) 158/65  04/04/17 130/60  04/02/17 (!) 138/52   Pulse Readings from Last 3 Encounters:  04/08/17 (!) 57  04/04/17 (!) 59  04/02/17 64    Renal function: Estimated Creatinine Clearance: 25.5 mL/min (A) (by C-G formula based on SCr of 1.74 mg/dL (H)).  Past Medical History:  Diagnosis Date  . Anemia   . Arthritis   . Atrial flutter ([place])    s/p ablation  . HTN (hypertension)   . Palpitations     Current Outpatient Medications on File Prior to Visit  Medication Sig Dispense Refill  . atenolol (TENORMIN) 25 MG tablet Take 1 tablet (25 mg total) by mouth daily.    . Biotin 5000 MCG CAPS Take by mouth.    . Calcium Carbonate-Vitamin D (CALCIUM + D PO) 1 tab po qd     . ciprofloxacin (CIPRO) 250 MG tablet Take 1 tablet (250 mg total) by mouth 2 (two) times daily. 14 tablet  0  . diltiazem (CARDIZEM CD) 240 MG 24 hr capsule TAKE ONE CAPSULE BY MOUTH EVERY DAY 90 capsule 2  . flecainide (TAMBOCOR) 50 MG tablet TAKE 1 TABLET BY MOUTH TWO TIMES DAILY 180 tablet 1  . hydrALAZINE (APRESOLINE) 25 MG tablet Take 1 tablet (25 mg total) by mouth 3 (three) times daily. 270 tablet 3  . lactulose (CEPHULAC) 20 g packet Take 1 packet (20 g total) by mouth 2 (two) times daily. 60 each 5  . latanoprost (XALATAN) 0.005 % ophthalmic solution Place 1 drop into both eyes daily.  4  . levothyroxine (SYNTHROID, LEVOTHROID) 50 MCG tablet TAKE 1 TABLET (50 MCG TOTAL) BY MOUTH DAILY. 90 tablet 0  . linaclotide (LINZESS) 145 MCG CAPS capsule Take 1 capsule (145 mcg total) by mouth daily before breakfast. 30 capsule 5  . losartan (COZAAR) 100 MG tablet Take 0.5 tablets (50 mg total) by mouth daily. 30 tablet 10  .  meloxicam (MOBIC) 15 MG tablet Take 1 tablet (15 mg total) by mouth as needed. 90 tablet 2  . Rivaroxaban (XARELTO) 15 MG TABS tablet TAKE 1 TABLET BY MOUTH EVERY DAY WITH SUPPER (Patient taking differently: 15 mg daily with supper. TAKE 1 TABLET BY MOUTH EVERY DAY WITH SUPPER) 30 tablet 5   No current facility-administered medications on file prior to visit.     No Known Allergies    Assessment/Plan:  1. Hypertension - BP improved however remains above goal <140/4mmHg in clinic and for most home readings. Will increase hydralazine to 50mg  TID and continue other medications. Checking BMET today with recent spironolactone discontinuation and losartan dose decrease. Also advised to decrease hoome blood pressure checks to no more than 3x per day due to anxiety regarding readings which further increases her BP. Checking Mg today per pt request since she has not been sleeping well. F/u in HTN clinic in 4 weeks.   Aryannah Mohon E. Alpha Mysliwiec, PharmD, CPP, [facility] [address], [place], [place] [address] Phone: [phone]; Fax: [phone] 04/18/2017 10:44 AM

## 2017-04-18 ENCOUNTER — Ambulatory Visit (INDEPENDENT_AMBULATORY_CARE_PROVIDER_SITE_OTHER): Payer: Medicare Other | Admitting: Pharmacist

## 2017-04-18 VITALS — BP 146/56 | HR 54

## 2017-04-18 DIAGNOSIS — I10 Essential (primary) hypertension: Secondary | ICD-10-CM | POA: Diagnosis not present

## 2017-04-18 LAB — BASIC METABOLIC PANEL
BUN / CREAT RATIO: 16 (ref 12–28)
BUN: 27 mg/dL (ref 8–27)
CALCIUM: 9.6 mg/dL (ref 8.7–10.3)
CO2: 22 mmol/L (ref 20–29)
CREATININE: 1.69 mg/dL — AB (ref 0.57–1.00)
Chloride: 103 mmol/L (ref 96–106)
GFR calc non Af Amer: 28 mL/min/{1.73_m2} — ABNORMAL LOW (ref >59)
GFR, EST AFRICAN AMERICAN: 33 mL/min/{1.73_m2} — AB (ref >59)
Glucose: 89 mg/dL (ref 65–99)
Potassium: 5.1 mmol/L (ref 3.5–5.2)
Sodium: 138 mmol/L (ref 134–144)

## 2017-04-18 LAB — MAGNESIUM: Magnesium: 2.1 mg/dL (ref 1.6–2.3)

## 2017-04-18 MED ORDER — LOSARTAN POTASSIUM 50 MG PO TABS: 50.0000 mg | ORAL_TABLET | Freq: Every day | ORAL | 3 refills | Status: DC

## 2017-04-18 MED ORDER — HYDRALAZINE HCL 50 MG PO TABS: 50.0000 mg | ORAL_TABLET | Freq: Three times a day (TID) | ORAL | 11 refills | Status: DC

## 2017-04-18 NOTE — Patient Instructions (Addendum)
Increase your hydralazine to 50mg  3x per day. You can take 2 of your 25mg  tablets 3x per day until you run out, then pick up your new prescription and take 1 tablet 3x per day.  Continue taking your other medications  Check your blood pressure 1-2 hours after taking your medication   Check your blood pressure no more than 3 times per day  Follow up in clinic in 4 weeks

## 2017-04-23 ENCOUNTER — Other Ambulatory Visit: Payer: Self-pay | Admitting: Physician Assistant

## 2017-04-23 DIAGNOSIS — E039 Hypothyroidism, unspecified: Secondary | ICD-10-CM

## 2017-05-05 ENCOUNTER — Encounter: Payer: Self-pay | Admitting: Gastroenterology

## 2017-05-05 ENCOUNTER — Ambulatory Visit: Payer: Medicare Other | Admitting: Gastroenterology

## 2017-05-05 VITALS — BP 130/58 | HR 60 | Ht 65.75 in | Wt 136.0 lb

## 2017-05-05 DIAGNOSIS — R198 Other specified symptoms and signs involving the digestive system and abdomen: Secondary | ICD-10-CM | POA: Diagnosis not present

## 2017-05-05 DIAGNOSIS — Z800 Family history of malignant neoplasm of digestive organs: Secondary | ICD-10-CM

## 2017-05-05 MED ORDER — LINACLOTIDE 145 MCG PO CAPS: 145.0000 ug | ORAL_CAPSULE | Freq: Every day | ORAL | 5 refills | Status: DC

## 2017-05-05 MED ORDER — PEG 3350-KCL-NA BICARB-NACL 420 G PO SOLR: 4000.0000 mL | ORAL | 0 refills | Status: DC

## 2017-05-05 MED ORDER — LINACLOTIDE 72 MCG PO CAPS: 72.0000 ug | ORAL_CAPSULE | Freq: Every day | ORAL | 11 refills | Status: DC

## 2017-05-05 NOTE — Telephone Encounter (Signed)
[facility] Pre-operative Risk Assessment     Request for surgical clearance:     Endoscopy Procedure  What type of surgery is being performed?     colonoscopy  When is this surgery scheduled?     06/13/17  What type of clearance is required ?   Pharmacy  Are there any medications that need to be held prior to surgery and how long? Xarelto  Practice name and name of physician performing surgery?      [facility]  What is your office phone and fax number?      Phone- [phone]  [phone]  Anesthesia type (None, local, MAC, general) ?       MAC

## 2017-05-05 NOTE — Telephone Encounter (Addendum)
Patient with diagnosis of Afib on Xarelto for anticoagulation.    Procedure: colonoscopy Date of procedure: 06/13/17  CHADS2-VASc score of  4 (CHF, HTN, AGE, DM2, stroke/tia x 2, CAD, AGE, female)  CrCl 10ml/min  Per office protocol, patient can hold Xarelto for 48 hours prior to procedure.

## 2017-05-05 NOTE — Telephone Encounter (Signed)
   Primary Cardiologist: No primary care provider on file.  Chart reviewed as part of pre-operative protocol coverage. Patient was contacted 05/05/2017 in reference to pre-operative risk assessment for pending surgery as outlined below.  Amy [name] was last seen on 02/20/17 by Dr. Harrington Challenger.  Since that day, Amy [name] has done well with no recurrences of afib and no chest discomfort or shortness of breath. .  Therefore, based on ACC/AHA guidelines, the patient would be at acceptable risk for the planned procedure without further cardiovascular testing.   I will route this to the preop pharmacy team for recommendations on holding Xarelto.   Once pharmacy weighs in please route this recommendation to the requesting party via [place] fax function and remove from pre-op pool.  Please call with questions.  Daune Perch, NP 05/05/2017, 2:33 PM

## 2017-05-05 NOTE — Progress Notes (Signed)
Review of pertinent gastrointestinal problems: 1.  FH colon cancer (brother) also personal history of precancerous polyp: Colonoscopy May 2013, Dr. Ardis Hughs, found subcentimeter adenoma in sigmoid, diverticulosis.   HPI: This is a very pleasant 82 year old Amy [name] who was referred to me by [name], PA-C  to evaluate change in bowels.    Chief complaint is change in bowels, constipation  2-3 months ago had fecal impaction, was given Kristalose powder and this helped.  But since then she's had worse than usual constipation.   She has had to take something for her bowels to move. Linzess 145 she takes it 2-3 times per weeks.  Has never tried fiber supplement  Never blood in her stool  Has gained 3 pounds   In the past 2-3 months.  Change in bowels, family history colon cancer, personal history of colon polyps  Review of systems: Pertinent positive and negative review of systems were noted in the above HPI section. All other review negative.   Past Medical History:  Diagnosis Date  . Anemia   . Arthritis   . Atrial fibrillation ([place])   . Atrial flutter ([place])    s/p ablation  . HTN (hypertension)   . Hypothyroidism   . Palpitations     Past Surgical History:  Procedure Laterality Date  . A FLUTTER ABLATION    . KNEE ARTHROSCOPY Right   . TUBAL LIGATION      Current Outpatient Medications  Medication Sig Dispense Refill  . acetaminophen (TYLENOL) 650 MG CR tablet Take 1,300 mg by mouth 2 (two) times daily as needed for pain.    Marland Kitchen AMBULATORY NON FORMULARY MEDICATION CBD Oil 1 dose under tongue once daily    . atenolol (TENORMIN) 25 MG tablet Take 1 tablet (25 mg total) by mouth daily.    . Biotin 5000 MCG CAPS Take by mouth.    . Calcium Carbonate-Vitamin D (CALCIUM + D PO) 1 tab po qd     . diltiazem (CARDIZEM CD) 240 MG 24 hr capsule TAKE ONE CAPSULE BY MOUTH EVERY DAY 90 capsule 2  . flecainide (TAMBOCOR) 50 MG tablet TAKE 1 TABLET BY MOUTH TWO TIMES DAILY 180 tablet  1  . hydrALAZINE (APRESOLINE) 50 MG tablet Take 1 tablet (50 mg total) by mouth 3 (three) times daily. 90 tablet Amy  . lactulose (CEPHULAC) 20 g packet Take 1 packet (20 g total) by mouth 2 (two) times daily. 60 each 5  . latanoprost (XALATAN) 0.005 % ophthalmic solution Place 1 drop into both eyes daily.  4  . levothyroxine (SYNTHROID, LEVOTHROID) 50 MCG tablet TAKE 1 TABLET (50 MCG TOTAL) BY MOUTH DAILY. 90 tablet 0  . linaclotide (LINZESS) 145 MCG CAPS capsule Take 1 capsule (145 mcg total) by mouth daily before breakfast. 30 capsule 5  . losartan (COZAAR) 50 MG tablet Take 1 tablet (50 mg total) by mouth daily. 90 tablet 3  . meloxicam (MOBIC) 15 MG tablet Take 1 tablet (15 mg total) by mouth as needed. 90 tablet 2  . Rivaroxaban (XARELTO) 15 MG TABS tablet TAKE 1 TABLET BY MOUTH EVERY DAY WITH SUPPER (Patient taking differently: 15 mg daily with supper. TAKE 1 TABLET BY MOUTH EVERY DAY WITH SUPPER) 30 tablet 5   No current facility-administered medications for this visit.     Allergies as of 05/05/2017  . (No Known Allergies)    Family History  Problem Relation Age of Onset  . Stroke Father 78  died from stroke at age 42  . Stroke Mother 42       died from stroke at age 70  . Heart disease Mother   . Colon cancer Brother        ? pt not sure    Social History   Socioeconomic History  . Marital status: Married    Spouse name: Not on file  . Number of children: 1  . Years of education: Not on file  . Highest education level: Not on file  Occupational History  . Occupation: Retired  Scientific laboratory technician  . Financial resource strain: Not on file  . Food insecurity:    Worry: Not on file    Inability: Not on file  . Transportation needs:    Medical: Not on file    Non-medical: Not on file  Tobacco Use  . Smoking status: Never Smoker  . Smokeless tobacco: Never Used  Substance and Sexual Activity  . Alcohol use: No  . Drug use: No  . Sexual activity: Not on file   Lifestyle  . Physical activity:    Days per week: Not on file    Minutes per session: Not on file  . Stress: Not on file  Relationships  . Social connections:    Talks on phone: Not on file    Gets together: Not on file    Attends religious service: Not on file    Active member of club or organization: Not on file    Attends meetings of clubs or organizations: Not on file    Relationship status: Not on file  . Intimate partner violence:    Fear of current or ex partner: Not on file    Emotionally abused: Not on file    Physically abused: Not on file    Forced sexual activity: Not on file  Other Topics Concern  . Not on file  Social History Narrative   Occ caffeine      Physical Exam: BP (!) 130/58 (BP Location: Left Arm, Patient Position: Sitting, Cuff Size: Normal)   Pulse 60   Ht 5' 5.75" (1.67 m) Comment: height measured without shoes  Wt 136 lb (61.7 kg)   LMP  (LMP Unknown)   BMI 22.12 kg/m  Constitutional: generally well-appearing Psychiatric: alert and oriented x3 Eyes: extraocular movements intact Mouth: oral pharynx moist, no lesions Neck: supple no lymphadenopathy Cardiovascular: heart regular rate and rhythm Lungs: clear to auscultation bilaterally Abdomen: soft, nontender, nondistended, no obvious ascites, no peritoneal signs, normal bowel sounds Extremities: no lower extremity edema bilaterally Skin: no lesions on visible extremities   Assessment and plan: 82 y.o. female with change in bowels, constipation, family history colon cancer, personal history of adenomatous colon polyps  I recommended that we proceed with colonoscopy at her soonest convenience to exclude significant neoplastic changes.  Since she has been gaining some weight in the past few months I think it is unlikely.  We will change her Linzess dosing to 72 mcg pills.  She will take 1 pill once daily for now.  She is on Xarelto for history of atrial fibrillation.  She knows that being on  that medicine increases the risk of significant bleeding during her colonoscopy.  I recommended that she hold the medicine for 1 day prior to the colonoscopy.  We will communicate with her cardiologist to make sure they feel that recommendation is safe.    Please see the "Patient Instructions" section for addition details about the plan.  Owens Loffler, MD [facility] 05/05/2017, 10:39 AM  Cc: [name], PA-C

## 2017-05-05 NOTE — Patient Instructions (Addendum)
You will be set up for a colonoscopy for change in bowels, FH of colon cancer, personal history of precancerous polyp. Hold your xarelto for 1 day prior to the colonoscopy. We will make sure your cardiologist feels it is safe. CHange linzess to 28mcg pill dose, one pill once daily, disp 30 with 11 refills. Amy [name]

## 2017-05-06 NOTE — Telephone Encounter (Signed)
Clearance faxed via Epic and faxed machine

## 2017-05-07 ENCOUNTER — Ambulatory Visit: Payer: Medicare Other | Admitting: Family Medicine

## 2017-05-07 ENCOUNTER — Encounter: Payer: Self-pay | Admitting: Family Medicine

## 2017-05-07 DIAGNOSIS — M170 Bilateral primary osteoarthritis of knee: Secondary | ICD-10-CM | POA: Diagnosis not present

## 2017-05-07 NOTE — Patient Instructions (Signed)
Great to see you  You are doing great! Keep it up  pennsaid pinkie amount topically 2 times daily as needed.  As long as you do well see me when you need me

## 2017-05-07 NOTE — Assessment & Plan Note (Signed)
Patient is doing very well with conservative therapy at this time.  We discussed icing regimen and home exercises.  Which activities doing which wants to avoid.  Topical anti-inflammatories.  As long as patient is welcome follow-up as needed

## 2017-05-07 NOTE — Progress Notes (Signed)
[name] [place], [place] [address] Phone: [phone] Subjective:      CC: Knee pain  ZTI:[id number]  Amy [name] is a 82 y.o. female coming in with complaint of knee pain. She has been able to garden without pain in her knees. She feels like they are stiff and sore with palpation.  States 80-90% better      Past Medical History:  Diagnosis Date  . Anemia   . Arthritis   . Atrial fibrillation ([place])   . Atrial flutter ([place])    s/p ablation  . HTN (hypertension)   . Hypothyroidism   . Palpitations    Past Surgical History:  Procedure Laterality Date  . A FLUTTER ABLATION    . KNEE ARTHROSCOPY Right   . TUBAL LIGATION     Social History   Socioeconomic History  . Marital status: Married    Spouse name: Not on file  . Number of children: 1  . Years of education: Not on file  . Highest education level: Not on file  Occupational History  . Occupation: Retired  Scientific laboratory technician  . Financial resource strain: Not on file  . Food insecurity:    Worry: Not on file    Inability: Not on file  . Transportation needs:    Medical: Not on file    Non-medical: Not on file  Tobacco Use  . Smoking status: Never Smoker  . Smokeless tobacco: Never Used  Substance and Sexual Activity  . Alcohol use: No  . Drug use: No  . Sexual activity: Not on file  Lifestyle  . Physical activity:    Days per week: Not on file    Minutes per session: Not on file  . Stress: Not on file  Relationships  . Social connections:    Talks on phone: Not on file    Gets together: Not on file    Attends religious service: Not on file    Active member of club or organization: Not on file    Attends meetings of clubs or organizations: Not on file    Relationship status: Not on file  Other Topics Concern  . Not on file  Social History Narrative   Occ caffeine    No Known Allergies Family History  Problem Relation Age of Onset  . Stroke Father 64         died from stroke at age 8  . Stroke Mother 95       died from stroke at age 40  . Heart disease Mother   . Colon cancer Brother        ? pt not sure     Past medical history, social, surgical and family history all reviewed in electronic medical record.  No pertanent information unless stated regarding to the chief complaint.   Review of Systems:Review of systems updated and as accurate as of 05/07/17  No headache, visual changes, nausea, vomiting, diarrhea, constipation, dizziness, abdominal pain, skin rash, fevers, chills, night sweats, weight loss, swollen lymph nodes, body aches, joint swelling, muscle aches, chest pain, shortness of breath, mood changes.   Objective  Blood pressure (!) 128/54, pulse (!) 59, height 5' 5.75" (1.67 m), weight 136 lb (61.7 kg), SpO2 98 %. Systems examined below as of 05/07/17   General: No apparent distress alert and oriented x3 mood and affect normal, dressed appropriately.  HEENT: Pupils equal, extraocular movements intact  Respiratory: Patient's speak in full sentences  and does not appear short of breath  Cardiovascular: No lower extremity edema, non tender, no erythema  Skin: Warm dry intact with no signs of infection or rash on extremities or on axial skeleton.  Abdomen: Soft nontender  Neuro: Cranial nerves II through XII are intact, neurovascularly intact in all extremities with 2+ DTRs and 2+ pulses.  Lymph: No lymphadenopathy of posterior or anterior cervical chain or axillae bilaterally.  Gait antalgic gait MSK: Mild tender with full range of motion and good stability and symmetric strength and tone of shoulders, elbows, wrist, hip, and ankles bilaterally.  Moderate arthritic changes of multiple joints Knee exams do have significant arthritic changes.  Patient does have near full range of motion.  Mild instability noted with valgus force.  Crepitus noted with range of motion.  Neurovascularly intact distally and 4+ out of 5 strength at  symmetric    Impression and Recommendations:     This case required medical decision making of moderate complexity.      Note: This dictation was prepared with Dragon dictation along with smaller phrase technology. Any transcriptional errors that result from this process are unintentional.

## 2017-05-20 ENCOUNTER — Ambulatory Visit (INDEPENDENT_AMBULATORY_CARE_PROVIDER_SITE_OTHER): Payer: Medicare Other | Admitting: Pharmacist

## 2017-05-20 ENCOUNTER — Encounter: Payer: Self-pay | Admitting: Pharmacist

## 2017-05-20 VITALS — BP 132/58 | HR 60

## 2017-05-20 DIAGNOSIS — I10 Essential (primary) hypertension: Secondary | ICD-10-CM | POA: Diagnosis not present

## 2017-05-20 NOTE — Progress Notes (Signed)
Patient ID: Amy [name]                 DOB: Mar 31, 1935                      MRN: [id number]     HPI: Amy [name] is a pleasant 82 y.o. female referred by Dr. Harrington Challenger to HTN clinic. PMH is significant for atrial flutter s/p ablation and HTN. Pt remains concerned about fluctuating BP readings throughout the day. Previously she reported that her mother had a stroke and pt is fearful of this happening to her. Her SCr increased to 1.74 and K increased to 5.4 on recent spironolactone which pt was advised to stop taking. Pt called clinic 3 days after that visit with report of low BP to 106/51. She was advised to decrease her losartan to 50mg  daily. Most recently BP was elevated at 146/56 and hydralazine was increased to 50mg  TID.  Pt presents today in good spirits with her husband. She has been checking her BP at home less frequently, about 3x a day. BP readings have been improved 120-150/60-70, HR 60s. She has been holding her arm out straight rather than resting it on a table when she checks her BP at home which may be increasing some home BP readings. She has been feeling well and denies dizziness, blurred vision, falls, or headaches. She does not use her meloxicam at all. She has been using a CBD sublingual oil that has been helping with her arthritic pain.   Current HTN meds: atenolol 25mg  daily (PM), losartan 50mg  daily (lunch), diltiazem 240mg  daily (AM), hydralazine 50mg  TID (9am, 4-5pm, 11pm) Previously tried: spironolactone 12.5mg  daily - increase in SCr and K to 5.4 BP goal: <140/71mmHg due to age  Family History: Mother had history of stroke.  Social History: Denies tobacco, alcohol, and illicit drug use.  Diet: No caffeine. Eats out more than cooking at home. Likes [employer] and [employer].  Exercise: Walks frequently   Home BP readings: 120-150/60-70s, HR 60s  Wt Readings from Last 3 Encounters:  05/07/17 136 lb (61.7 kg)  05/05/17 136 lb (61.7 kg)  04/08/17 140 lb (63.5 kg)   BP  Readings from Last 3 Encounters:  05/07/17 (!) 128/54  05/05/17 (!) 130/58  04/18/17 (!) 146/56   Pulse Readings from Last 3 Encounters:  05/07/17 (!) 59  05/05/17 60  04/18/17 (!) 54    Renal function: CrCl cannot be calculated (Patient's most recent lab result is older than the maximum 21 days allowed.).  Past Medical History:  Diagnosis Date  . Anemia   . Arthritis   . Atrial fibrillation ([place])   . Atrial flutter ([place])    s/p ablation  . HTN (hypertension)   . Hypothyroidism   . Palpitations     Current Outpatient Medications on File Prior to Visit  Medication Sig Dispense Refill  . acetaminophen (TYLENOL) 650 MG CR tablet Take 1,300 mg by mouth 2 (two) times daily as needed for pain.    Marland Kitchen AMBULATORY NON FORMULARY MEDICATION CBD Oil 1 dose under tongue once daily    . atenolol (TENORMIN) 25 MG tablet Take 1 tablet (25 mg total) by mouth daily.    . Biotin 5000 MCG CAPS Take by mouth.    . Calcium Carbonate-Vitamin D (CALCIUM + D PO) 1 tab po qd     . diltiazem (CARDIZEM CD) 240 MG 24 hr capsule TAKE ONE CAPSULE BY MOUTH EVERY DAY 90  capsule 2  . flecainide (TAMBOCOR) 50 MG tablet TAKE 1 TABLET BY MOUTH TWO TIMES DAILY 180 tablet 1  . hydrALAZINE (APRESOLINE) 50 MG tablet Take 1 tablet (50 mg total) by mouth 3 (three) times daily. 90 tablet 11  . lactulose (CEPHULAC) 20 g packet Take 1 packet (20 g total) by mouth 2 (two) times daily. 60 each 5  . latanoprost (XALATAN) 0.005 % ophthalmic solution Place 1 drop into both eyes daily.  4  . levothyroxine (SYNTHROID, LEVOTHROID) 50 MCG tablet TAKE 1 TABLET (50 MCG TOTAL) BY MOUTH DAILY. 90 tablet 0  . linaclotide (LINZESS) 145 MCG CAPS capsule Take 1 capsule (145 mcg total) by mouth daily before breakfast. 30 capsule 5  . linaclotide (LINZESS) 72 MCG capsule Take 1 capsule (72 mcg total) by mouth daily before breakfast. 30 capsule 11  . losartan (COZAAR) 50 MG tablet Take 1 tablet (50 mg total) by mouth daily. 90 tablet 3  .  meloxicam (MOBIC) 15 MG tablet Take 1 tablet (15 mg total) by mouth as needed. 90 tablet 2  . polyethylene glycol-electrolytes (NULYTELY/GOLYTELY) 420 g solution Take 4,000 mLs by mouth as directed. 4000 mL 0  . Rivaroxaban (XARELTO) 15 MG TABS tablet TAKE 1 TABLET BY MOUTH EVERY DAY WITH SUPPER (Patient taking differently: 15 mg daily with supper. TAKE 1 TABLET BY MOUTH EVERY DAY WITH SUPPER) 30 tablet 5   No current facility-administered medications on file prior to visit.     No Known Allergies    Assessment/Plan:  1. Hypertension - BP improved and at goal <140/34mmHg since increasing hydralazine to 50mg  TID. Will continue current medications including hydralazine 50mg  TID, losartan 50mg  daily, atenolol 25mg  daily, and diltiazem 240mg  daily. Advised pt to make sure she is resting her arm on a table when she checks her BP at home as current BP technique at home is likely falsely elevating some readings. F/u in HTN clinic as needed.   Avry Roedl E. Supple, PharmD, CPP, [place] [address], [place], [place] [address] Phone: [phone]; Fax: [phone] 05/20/2017 7:16 AM

## 2017-05-20 NOTE — Patient Instructions (Signed)
It was nice to see you today  Your blood pressure looks excellent, continue your current medications  Your blood pressure goal is < 140/4mmHg  Follow up in clinic as needed. [phone]

## 2017-05-21 ENCOUNTER — Other Ambulatory Visit: Payer: Self-pay | Admitting: Internal Medicine

## 2017-05-21 NOTE — Telephone Encounter (Signed)
Assessment/Plan:  1. Hypertension - BP improved and at goal <140/18mmHg since increasing hydralazine to 50mg  TID. Will continue current medications including hydralazine 50mg  TID, losartan 50mg  daily, atenolol 25mg  daily, and diltiazem 240mg  daily. Advised pt to make sure she is resting her arm on a table when she checks her BP at home as current BP technique at home is likely falsely elevating some readings. F/u in HTN clinic as needed.

## 2017-06-02 ENCOUNTER — Encounter: Payer: Self-pay | Admitting: Gastroenterology

## 2017-06-02 ENCOUNTER — Telehealth: Payer: Self-pay | Admitting: Pharmacist

## 2017-06-02 NOTE — Telephone Encounter (Signed)
Pt called and is having swelling in lower extremities. She reports that the last few weeks she has had significant swelling in both lower legs and she is concerned for her heart. Her blood pressure has been doing well on current doses of medications, but she is worried about her swelling. She reports that her legs are sore after being swollen. She does state that propping them up helps, but she is active and not able to prop them up all day. Advised to keep propped up as best as she can and come for visit with pharm tomorrow to address BP and swelling.

## 2017-06-03 ENCOUNTER — Ambulatory Visit (INDEPENDENT_AMBULATORY_CARE_PROVIDER_SITE_OTHER): Payer: Medicare Other | Admitting: Pharmacist

## 2017-06-03 ENCOUNTER — Encounter: Payer: Self-pay | Admitting: Pharmacist

## 2017-06-03 ENCOUNTER — Other Ambulatory Visit: Payer: Self-pay | Admitting: Internal Medicine

## 2017-06-03 VITALS — BP 132/64 | HR 57

## 2017-06-03 DIAGNOSIS — I10 Essential (primary) hypertension: Secondary | ICD-10-CM

## 2017-06-03 LAB — BASIC METABOLIC PANEL
BUN/Creatinine Ratio: 11 — ABNORMAL LOW (ref 12–28)
BUN: 17 mg/dL (ref 8–27)
CALCIUM: 9.9 mg/dL (ref 8.7–10.3)
CO2: 22 mmol/L (ref 20–29)
CREATININE: 1.5 mg/dL — AB (ref 0.57–1.00)
Chloride: 102 mmol/L (ref 96–106)
GFR calc Af Amer: 38 mL/min/{1.73_m2} — ABNORMAL LOW (ref >59)
GFR, EST NON AFRICAN AMERICAN: 33 mL/min/{1.73_m2} — AB (ref >59)
GLUCOSE: 86 mg/dL (ref 65–99)
Potassium: 4.6 mmol/L (ref 3.5–5.2)
Sodium: 141 mmol/L (ref 134–144)

## 2017-06-03 MED ORDER — FUROSEMIDE 20 MG PO TABS: 20.0000 mg | ORAL_TABLET | Freq: Every day | ORAL | 0 refills | Status: DC | PRN

## 2017-06-03 NOTE — Patient Instructions (Addendum)
Blood work today - BMET  Return for a follow up appointment with Dr. Harrington Challenger in July   Check your blood pressure at home daily (if able) and keep record of the readings.  Take your BP meds as follows: We will send for furosemide 20mg  JUST as NEEDED for swelling in your feet/lower legs IF you need to use more than 3 days in a row please call the office [phone]  Bring all of your meds, your BP cuff and your record of home blood pressures to your next appointment.  Exercise as you're able, try to walk approximately 30 minutes per day.  Keep salt intake to a minimum, especially watch canned and prepared boxed foods.  Eat more fresh fruits and vegetables and fewer canned items.  Avoid eating in fast food restaurants.    HOW TO TAKE YOUR BLOOD PRESSURE: . Rest 5 minutes before taking your blood pressure. .  Don't smoke or drink caffeinated beverages for at least 30 minutes before. . Take your blood pressure before (not after) you eat. . Sit comfortably with your back supported and both feet on the floor (don't cross your legs). . Elevate your arm to heart level on a table or a desk. . Use the proper sized cuff. It should fit smoothly and snugly around your bare upper arm. There should be enough room to slip a fingertip under the cuff. The bottom edge of the cuff should be 1 inch above the crease of the elbow. . Ideally, take 3 measurements at one sitting and record the average.

## 2017-06-03 NOTE — Progress Notes (Signed)
Patient ID: Amy [name]                 DOB: 1936/01/08                      MRN: [id number]     HPI: Amy [name] is a 82 y.o. female patient of Dr. Harrington Challenger who presents today for hypertension follow up. PMH significant for atrial flutter s/p ablation and HTN. Pt remains concerned about fluctuating BP readings throughout the day. Previously she reported that her mother had a stroke and pt is fearful of this happening to her. Her SCr increased to 1.74 and K increased to 5.4 on recent spironolactone which pt was advised to stop taking. She was started on hydralazine which was subsequently titrated to 50mg  TID. Her pressures have been controlled on her current regimen. She called yesterday with significant swelling in lower extremities for the last 2 weeks which is new for her.   She presents today with her husband. She states that over the last 2 weeks she has had more swelling in her lower legs. She states that after a long day on her feet she notices that her legs are more puffy than they have been previously. Her pressures have been doing really well on her current regimen. She has also felt well other than the soreness from being swollen. She does not have significant edema today, but she states that it has been significant other days. She monitors her weight and states that she usually does not fluctuate more than 2-3 lbs.   Current HTN meds: atenolol 25mg  daily (PM), losartan 50mg  daily (lunch), diltiazem 240mg  daily (AM), hydralazine 50mg  TID (9am, 4-5pm, 11pm) Previously tried: spironolactone 12.5mg  daily - increase in SCr and K to 5.4 BP goal: <140/78mmHg due to age  Family History: Mother had history of stroke.  Social History: Denies tobacco, alcohol, and illicit drug use.  Diet: No caffeine. Eats out more than cooking at home. Likes [employer] and [employer].  Exercise: Walks frequently  Home BP readings: 120-157/60-70s   Wt Readings from Last 3 Encounters:  05/07/17 136 lb (61.7 kg)    05/05/17 136 lb (61.7 kg)  04/08/17 140 lb (63.5 kg)   BP Readings from Last 3 Encounters:  06/03/17 132/64  05/20/17 (!) 132/58  05/07/17 (!) 128/54   Pulse Readings from Last 3 Encounters:  06/03/17 (!) 57  05/20/17 60  05/07/17 (!) 59    Renal function: CrCl cannot be calculated (Patient's most recent lab result is older than the maximum 21 days allowed.).  Past Medical History:  Diagnosis Date  . Anemia   . Arthritis   . Atrial fibrillation ([place])   . Atrial flutter ([place])    s/p ablation  . HTN (hypertension)   . Hypothyroidism   . Palpitations     Current Outpatient Medications on File Prior to Visit  Medication Sig Dispense Refill  . acetaminophen (TYLENOL) 650 MG CR tablet Take 1,300 mg by mouth 2 (two) times daily as needed for pain.    Marland Kitchen AMBULATORY NON FORMULARY MEDICATION CBD Oil 1 dose under tongue once daily    . atenolol (TENORMIN) 25 MG tablet Take 1 tablet (25 mg total) by mouth daily.    Marland Kitchen atenolol (TENORMIN) 25 MG tablet Take 1 tablet (25 mg total) by mouth daily. 90 tablet 0  . Biotin 5000 MCG CAPS Take by mouth.    . Calcium Carbonate-Vitamin D (CALCIUM + D PO)  1 tab po qd     . diltiazem (CARDIZEM CD) 240 MG 24 hr capsule TAKE ONE CAPSULE BY MOUTH EVERY DAY 90 capsule 2  . flecainide (TAMBOCOR) 50 MG tablet TAKE 1 TABLET BY MOUTH TWO TIMES DAILY 180 tablet 1  . hydrALAZINE (APRESOLINE) 50 MG tablet Take 1 tablet (50 mg total) by mouth 3 (three) times daily. 90 tablet 11  . lactulose (CEPHULAC) 20 g packet Take 1 packet (20 g total) by mouth 2 (two) times daily. 60 each 5  . latanoprost (XALATAN) 0.005 % ophthalmic solution Place 1 drop into both eyes daily.  4  . levothyroxine (SYNTHROID, LEVOTHROID) 50 MCG tablet TAKE 1 TABLET (50 MCG TOTAL) BY MOUTH DAILY. 90 tablet 0  . linaclotide (LINZESS) 145 MCG CAPS capsule Take 1 capsule (145 mcg total) by mouth daily before breakfast. 30 capsule 5  . linaclotide (LINZESS) 72 MCG capsule Take 1 capsule (72  mcg total) by mouth daily before breakfast. 30 capsule 11  . losartan (COZAAR) 50 MG tablet Take 1 tablet (50 mg total) by mouth daily. 90 tablet 3  . meloxicam (MOBIC) 15 MG tablet Take 1 tablet (15 mg total) by mouth as needed. 90 tablet 2  . polyethylene glycol-electrolytes (NULYTELY/GOLYTELY) 420 g solution Take 4,000 mLs by mouth as directed. 4000 mL 0  . Rivaroxaban (XARELTO) 15 MG TABS tablet TAKE 1 TABLET BY MOUTH EVERY DAY WITH SUPPER (Patient taking differently: 15 mg daily with supper. TAKE 1 TABLET BY MOUTH EVERY DAY WITH SUPPER) 30 tablet 5   No current facility-administered medications on file prior to visit.     No Known Allergies  Blood pressure 132/64, pulse (!) 57.   Assessment/Plan: Hypertension: BMET today. Pressure is at goal today. Will continue medications as prescribed for pressure control. Have given her furosemide 20mg  to take PRN edema. She is advised to call if she needs to take more than 3 days in a row. She will also schedule follow up with Dr. Harrington Challenger today. She will need BMET at that visit with addition of furosemide and sooner if she needs furosemide frequently. Follow up in HTN clinic if needed. Pt aware to call with usage and if needed for pressure changes.   Pt can be reached by cell as will be out of town in [place] area.   Thank you, Lelan Pons. Patterson Hammersmith, [facility]  06/03/2017 11:47 AM  ADDENDUM: BMET stable. Continue as above. Spoke with patient and advised of results.

## 2017-06-09 ENCOUNTER — Telehealth: Payer: Self-pay

## 2017-06-09 NOTE — Telephone Encounter (Signed)
Hi [name],  Amy [name] is having a colonoscopy Friday 06/13/17 with Dr. Ardis Hughs. She is on Xarelto. She was seen in the office on 05-05-17 and Dr. Ardis Hughs mentioned that she should hold Xarelto for one day, but he also mentioned that they would check with her cardiologist to make sure that it was safe to do this. I did not see a note from the cardiologist so I want to make sure that it is ok and that the patient knows what to do before her procedure on Friday. Thanks.   Riki Sheer, LPN

## 2017-06-10 NOTE — Telephone Encounter (Signed)
[facility] Pre-operative Risk Assessment     Request for surgical clearance:     Endoscopy Procedure  What type of surgery is being performed?     colonoscopy  When is this surgery scheduled?     06/13/17  What type of clearance is required ?   Pharmacy  Are there any medications that need to be held prior to surgery and how long? 24 hours  Practice name and name of physician performing surgery?      [facility]  What is your office phone and fax number?      Phone- [phone]  [phone]  Anesthesia type (None, local, MAC, general) ?       MAC   Please respond by  Today if possible and route back to Grace Bushy, [place]

## 2017-06-10 NOTE — Telephone Encounter (Signed)
Amy [name] can you check on this for me.  I don't see a response from cardiology.  Thanks

## 2017-06-11 ENCOUNTER — Other Ambulatory Visit: Payer: Self-pay | Admitting: Internal Medicine

## 2017-06-11 NOTE — Telephone Encounter (Signed)
Xarelto 15mg  refill request received; pt is 82 yrs old, wt-61.7kg, Crea-1.50 on 06/03/17, last seen by Dr. Harrington Challenger on 02/20/17, CrCl-29.26ml/min; will send in refill to requested pharmacy.

## 2017-06-11 NOTE — Telephone Encounter (Signed)
The pt has been advised to hold xarelto 1 day prior to procedure and all questions answered

## 2017-06-11 NOTE — Telephone Encounter (Signed)
Patient with diagnosis of A FIB on XARELTOfor anticoagulation.    Procedure: COLONOSCOPY Date of procedure: 06/13/2017  CHADS2-VASc score of  4 (CHF, HTN, AGE, DM2, stroke/tia x 2, CAD, AGE, female)  CrCl = 30 ML/MIN   Per office protocol, patient can hold XARELTO for 1 day prior to procedure.    Brookley Spitler Rodriguez-Guzman PharmD, BCPS, [place] [id number] 06/11/2017 10:13 AM

## 2017-06-13 ENCOUNTER — Other Ambulatory Visit: Payer: Self-pay

## 2017-06-13 ENCOUNTER — Encounter

## 2017-06-13 ENCOUNTER — Ambulatory Visit (AMBULATORY_SURGERY_CENTER): Payer: Medicare Other | Admitting: Gastroenterology

## 2017-06-13 ENCOUNTER — Encounter: Payer: Self-pay | Admitting: Gastroenterology

## 2017-06-13 VITALS — BP 164/73 | HR 77 | Temp 97.8°F | Resp 13 | Ht 65.75 in | Wt 136.0 lb

## 2017-06-13 DIAGNOSIS — Z800 Family history of malignant neoplasm of digestive organs: Secondary | ICD-10-CM

## 2017-06-13 DIAGNOSIS — D120 Benign neoplasm of cecum: Secondary | ICD-10-CM

## 2017-06-13 DIAGNOSIS — K5730 Diverticulosis of large intestine without perforation or abscess without bleeding: Secondary | ICD-10-CM | POA: Diagnosis not present

## 2017-06-13 DIAGNOSIS — Z86010 Personal history of colonic polyps: Secondary | ICD-10-CM

## 2017-06-13 DIAGNOSIS — R198 Other specified symptoms and signs involving the digestive system and abdomen: Secondary | ICD-10-CM

## 2017-06-13 DIAGNOSIS — D123 Benign neoplasm of transverse colon: Secondary | ICD-10-CM

## 2017-06-13 DIAGNOSIS — D125 Benign neoplasm of sigmoid colon: Secondary | ICD-10-CM | POA: Diagnosis not present

## 2017-06-13 MED ORDER — SODIUM CHLORIDE 0.9 % IV SOLN
500.0000 mL | Freq: Once | INTRAVENOUS | Status: DC
Start: 2017-06-13 — End: 2019-09-13

## 2017-06-13 NOTE — Op Note (Signed)
[place] Patient Name: Archie Atilano Procedure Date: 06/13/2017 3:06 PM MRN: [id number] Endoscopist: Milus Banister , MD Age: 82 Referring MD:  Date of Birth: 1935/07/04 Gender: Female Account #: [id number] Procedure:                Colonoscopy Indications:              High risk colon cancer surveillance: FH colon                            cancer (brother) also personal history of                            precancerous polyp: Colonoscopy May 2013, Dr.                            Ardis Hughs, found subcentimeter adenoma in sigmoid,                            diverticulosis. Medicines:                Monitored Anesthesia Care Procedure:                Pre-Anesthesia Assessment:                           - Prior to the procedure, a History and Physical                            was performed, and patient medications and                            allergies were reviewed. The patient's tolerance of                            previous anesthesia was also reviewed. The risks                            and benefits of the procedure and the sedation                            options and risks were discussed with the patient.                            All questions were answered, and informed consent                            was obtained. Prior Anticoagulants: The patient has                            taken no previous anticoagulant or antiplatelet                            agents. ASA Grade Assessment: III - A patient with  severe systemic disease. After reviewing the risks                            and benefits, the patient was deemed in                            satisfactory condition to undergo the procedure.                           After obtaining informed consent, the colonoscope                            was passed under direct vision. Throughout the                            procedure, the patient's blood pressure, pulse, and                  oxygen saturations were monitored continuously. The                            Colonoscope was introduced through the anus and                            advanced to the the cecum, identified by                            appendiceal orifice and ileocecal valve. The                            colonoscopy was performed without difficulty. The                            patient tolerated the procedure well. The quality                            of the bowel preparation was good. The ileocecal                            valve, appendiceal orifice, and rectum were                            photographed. Scope In: 3:13:14 PM Scope Out: 3:26:36 PM Scope Withdrawal Time: 0 hours 9 minutes 30 seconds  Total Procedure Duration: 0 hours 13 minutes 22 seconds  Findings:                 Three sessile polyps were found in the sigmoid                            colon, transverse colon and cecum. The polyps were                            3 to 6 mm in size. These polyps were removed with a  cold snare. Resection and retrieval were complete.                           Multiple small-mouthed diverticula were found in                            the left colon.                           The exam was otherwise without abnormality on                            direct and retroflexion views. Complications:            No immediate complications. Estimated blood loss:                            None. Estimated Blood Loss:     Estimated blood loss: none. Impression:               - Three 3 to 6 mm polyps in the sigmoid colon, in                            the transverse colon and in the cecum, removed with                            a cold snare. Resected and retrieved.                           - Diverticulosis in the left colon.                           - The examination was otherwise normal on direct                            and retroflexion views. Recommendation:            - Patient has a contact number available for                            emergencies. The signs and symptoms of potential                            delayed complications were discussed with the                            patient. Return to normal activities tomorrow.                            Written discharge instructions were provided to the                            patient.                           - Resume previous diet.                           -  Continue present medications. Continue linzess                            one pill once daily since it seems to be helping.                           You do not need any further colon cancer screening                            tests (including stool testing). These types of                            tests generally stop around age 59-80.                           Await final pathology results. You will receive a                            letter within 2-3 weeks with the pathology results                            and my final recommendations. Milus Banister, MD 06/13/2017 3:31:31 PM This report has been signed electronically.

## 2017-06-13 NOTE — Progress Notes (Signed)
Called to room to assist during endoscopic procedure.  Patient ID and intended procedure confirmed with present staff. Received instructions for my participation in the procedure from the performing physician.  

## 2017-06-13 NOTE — Progress Notes (Signed)
Report to PACU, RN, vss, BBS= Clear.  

## 2017-06-13 NOTE — Patient Instructions (Signed)
Await pathology results in mail approximately 2-3 weeks.  Handouts given for polyps and diverticulosis.  Amy [name] tomorrow Saturday 06/14/2017  Continue with Linzess one pill daily.   YOU HAD AN ENDOSCOPIC PROCEDURE TODAY AT [facility]:   Refer to the procedure report that was given to you for any specific questions about what was found during the examination.  If the procedure report does not answer your questions, please call your gastroenterologist to clarify.  If you requested that your care partner not be given the details of your procedure findings, then the procedure report has been included in a sealed envelope for you to review at your convenience later.  YOU SHOULD EXPECT: Some feelings of bloating in the abdomen. Passage of more gas than usual.  Walking can help get rid of the air that was put into your GI tract during the procedure and reduce the bloating. If you had a lower endoscopy (such as a colonoscopy or flexible sigmoidoscopy) you may notice spotting of blood in your stool or on the toilet paper. If you underwent a bowel prep for your procedure, you may not have a normal bowel movement for a few days.  Please Note:  You might notice some irritation and congestion in your nose or some drainage.  This is from the oxygen used during your procedure.  There is no need for concern and it should clear up in a day or so.  SYMPTOMS TO REPORT IMMEDIATELY:   Following lower endoscopy (colonoscopy or flexible sigmoidoscopy):  Excessive amounts of blood in the stool  Significant tenderness or worsening of abdominal pains  Swelling of the abdomen that is new, acute  Fever of 100F or higher    For urgent or emergent issues, a gastroenterologist can be reached at any hour by calling [phone].   DIET:  We do recommend a small meal at first, but then you may proceed to your regular diet.  Drink plenty of fluids but you should avoid alcoholic beverages for  24 hours.  ACTIVITY:  You should plan to take it easy for the rest of today and you should NOT DRIVE or use heavy machinery until tomorrow (because of the sedation medicines used during the test).    FOLLOW UP: Our staff will call the number listed on your records the next business day following your procedure to check on you and address any questions or concerns that you may have regarding the information given to you following your procedure. If we do not reach you, we will leave a message.  However, if you are feeling well and you are not experiencing any problems, there is no need to return our call.  We will assume that you have returned to your regular daily activities without incident.  If any biopsies were taken you will be contacted by phone or by letter within the next 1-3 weeks.  Please call us at [phone] if you have not heard about the biopsies in 3 weeks.    SIGNATURES/CONFIDENTIALITY: You and/or your care partner have signed paperwork which will be entered into your electronic medical record.  These signatures attest to the fact that that the information above on your After Visit Summary has been reviewed and is understood.  Full responsibility of the confidentiality of this discharge information lies with you and/or your care-partner.

## 2017-06-17 ENCOUNTER — Telehealth: Payer: Self-pay | Admitting: *Deleted

## 2017-06-17 NOTE — Telephone Encounter (Signed)
  Follow up Call-  Call back number 06/13/2017  Post procedure Call Back phone  # [phone]  Permission to leave phone message Yes  Some recent data might be hidden     Patient questions:  Do you have a fever, pain , or abdominal swelling? No. Pain Score  0 *  Have you tolerated food without any problems? Yes.    Have you been able to return to your normal activities? Yes.    Do you have any questions about your discharge instructions: Diet   No. Medications  Yes.  Patient was asking about taking the linzess and citrucel daily for constipation. Told her to take it has prescribed and to call back with questions or if the medication wasn't helping. Sm Follow up visit  No.  Do you have questions or concerns about your Care? No.  Actions: * If pain score is 4 or above: No action needed, pain <4.

## 2017-06-20 ENCOUNTER — Encounter: Payer: Self-pay | Admitting: Gastroenterology

## 2017-06-25 ENCOUNTER — Other Ambulatory Visit: Payer: Self-pay | Admitting: Internal Medicine

## 2017-07-10 ENCOUNTER — Encounter: Payer: Self-pay | Admitting: Physician Assistant

## 2017-07-10 ENCOUNTER — Ambulatory Visit: Payer: Medicare Other | Admitting: Physician Assistant

## 2017-07-10 VITALS — BP 126/49 | HR 57 | Temp 98.1°F | Ht 65.75 in | Wt 132.6 lb

## 2017-07-10 DIAGNOSIS — I10 Essential (primary) hypertension: Secondary | ICD-10-CM

## 2017-07-10 DIAGNOSIS — I4891 Unspecified atrial fibrillation: Secondary | ICD-10-CM | POA: Diagnosis not present

## 2017-07-10 DIAGNOSIS — R600 Localized edema: Secondary | ICD-10-CM | POA: Diagnosis not present

## 2017-07-11 NOTE — Progress Notes (Signed)
BP (!) 126/49   Pulse (!) 57   Temp 98.1 F (36.7 C) (Oral)   Ht 5' 5.75" (1.67 m)   Wt 132 lb 9.6 oz (60.1 kg)   LMP  (LMP Unknown)   BMI 21.57 kg/m    Subjective:    Patient ID: Amy [name], female    DOB: 1935/12/15, 82 y.o.   MRN: [id number]  HPI: Amy [name] is a 82 y.o. female presenting on 07/10/2017 for Edema (feet and ankles )  Patient comes in to discuss the edema in her lower legs.  It is mildly bothersome.  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  She had more swelling then.  Her feet do feel warm at this time.  She states that she normally has cold feet.  She is on diltiazem which is a calcium channel blocker and can cause some edema.  She takes this for her heart rhythm issue and were not very comfortable stopping it at this time.  If she can continue to take it, use compression stockings and motion and massage to keep her legs calm down that would be best.  She has not had any skin breaks or or wheezing.  Past Medical History:  Diagnosis Date  . Anemia   . Arthritis   . Atrial fibrillation ([place])   . Atrial flutter ([place])    s/p ablation  . HTN (hypertension)   . Hypothyroidism   . Palpitations    Relevant past medical, surgical, family and social history reviewed and updated as indicated. Interim medical history since our last visit reviewed. Allergies and medications reviewed and updated. DATA REVIEWED: CHART IN EPIC  Family History reviewed for pertinent findings.  Review of Systems  Constitutional: Negative.  Negative for activity change, fatigue and fever.  HENT: Negative.   Eyes: Negative.   Respiratory: Negative.  Negative for cough.   Cardiovascular: Positive for leg swelling. Negative for chest pain.  Gastrointestinal: Negative.  Negative for abdominal pain.  Endocrine: Negative.   Genitourinary: Negative.  Negative for dysuria.  Musculoskeletal: Negative.   Skin: Positive for color change.    Neurological: Negative.     Allergies as of 07/10/2017   No Known Allergies     Medication List        Accurate as of 07/10/17 11:59 PM. Always use your most recent med list.          acetaminophen 650 MG CR tablet Commonly known as:  TYLENOL Take 1,300 mg by mouth 2 (two) times daily as needed for pain.   AMBULATORY NON FORMULARY MEDICATION CBD Oil 1 dose under tongue once daily   atenolol 25 MG tablet Commonly known as:  TENORMIN Take 1 tablet (25 mg total) by mouth daily.   Biotin 5000 MCG Caps Take by mouth.   CALCIUM + D PO 1 tab po qd   diltiazem 240 MG 24 hr capsule Commonly known as:  CARDIZEM CD TAKE ONE CAPSULE BY MOUTH EVERY DAY   flecainide 50 MG tablet Commonly known as:  TAMBOCOR TAKE 1 TABLET BY MOUTH TWICE A DAY   furosemide 20 MG tablet Commonly known as:  LASIX Take 1 tablet (20 mg total) by mouth daily as needed for edema.   hydrALAZINE 50 MG tablet Commonly known as:  APRESOLINE Take 1 tablet (50 mg total) by mouth 3 (three) times daily.   lactulose 20 g packet Commonly known as:  CEPHULAC Take  1 packet (20 g total) by mouth 2 (two) times daily.   latanoprost 0.005 % ophthalmic solution Commonly known as:  XALATAN Place 1 drop into both eyes daily.   levothyroxine 50 MCG tablet Commonly known as:  SYNTHROID, LEVOTHROID TAKE 1 TABLET (50 MCG TOTAL) BY MOUTH DAILY.   linaclotide 145 MCG Caps capsule Commonly known as:  LINZESS Take 1 capsule (145 mcg total) by mouth daily before breakfast.   linaclotide 72 MCG capsule Commonly known as:  LINZESS Take 1 capsule (72 mcg total) by mouth daily before breakfast.   losartan 50 MG tablet Commonly known as:  COZAAR Take 1 tablet (50 mg total) by mouth daily.   meloxicam 15 MG tablet Commonly known as:  MOBIC Take 1 tablet (15 mg total) by mouth as needed.   XARELTO 15 MG Tabs tablet Generic drug:  Rivaroxaban TAKE 1 TABLET BY MOUTH EVERY DAY WITH SUPPER          Objective:     BP (!) 126/49   Pulse (!) 57   Temp 98.1 F (36.7 C) (Oral)   Ht 5' 5.75" (1.67 m)   Wt 132 lb 9.6 oz (60.1 kg)   LMP  (LMP Unknown)   BMI 21.57 kg/m   No Known Allergies  Wt Readings from Last 3 Encounters:  07/10/17 132 lb 9.6 oz (60.1 kg)  06/13/17 136 lb (61.7 kg)  05/07/17 136 lb (61.7 kg)    Physical Exam  Constitutional: She is oriented to person, place, and time. She appears well-developed and well-nourished.  HENT:  Head: Normocephalic and atraumatic.  Eyes: Pupils are equal, round, and reactive to light. Conjunctivae and EOM are normal.  Cardiovascular: Normal rate, regular rhythm, normal heart sounds and intact distal pulses.  Pulmonary/Chest: Effort normal and breath sounds normal.  Abdominal: Soft. Bowel sounds are normal.  Neurological: She is alert and oriented to person, place, and time. She has normal reflexes.  Skin: Skin is warm and dry. No rash noted.  Trace edema pretibial both legs, warm to the touch, slightly darker pink color in her lower legs.  Vascular exam is very good.  Psychiatric: She has a normal mood and affect. Her behavior is normal. Judgment and thought content normal.    Results for orders placed or performed in visit on 44/01/02  Basic Metabolic Panel (BMET)  Result Value Ref Range   Glucose 86 65 - 99 mg/dL   BUN 17 8 - 27 mg/dL   Creatinine, Ser 1.50 (H) 0.57 - 1.00 mg/dL   GFR calc non Af Amer 33 (L) >59 mL/min/1.73   GFR calc Af Amer 38 (L) >59 mL/min/1.73   BUN/Creatinine Ratio 11 (L) 12 - 28   Sodium 141 134 - 144 mmol/L   Potassium 4.6 3.5 - 5.2 mmol/L   Chloride 102 96 - 106 mmol/L   CO2 22 20 - 29 mmol/L   Calcium 9.9 8.7 - 10.3 mg/dL      Assessment & Plan:   1. Localized edema Compression socks, massage  2. Atrial fibrillation, unspecified type (HCC) Continue diltiazem Possible edema from this  3. Essential hypertension Continue medications  Continue all other maintenance medications as listed  above.  Follow up plan: No follow-ups on file.  Educational handout given for [place] PA-C [facility] [address]  [place], [place] [address] [phone]   07/11/2017, 3:14 PM

## 2017-07-16 ENCOUNTER — Telehealth: Payer: Self-pay | Admitting: Pharmacist

## 2017-07-16 NOTE — Telephone Encounter (Signed)
Pt called clinic with reports of low BP reading today 80s/50s and she had less energy. She has not had any other readings like this recently and clinic readings have been controlled [id number] systolic. She took a dose of furosemide today to help with swelling but does not report anything else out of the ordinary. Advised pt to skip her next dose of hydralazine and keep a close eye on her BP. If her readings remain low, may need to permanently decrease hydralazine or losartan dose.

## 2017-07-21 ENCOUNTER — Other Ambulatory Visit: Payer: Self-pay | Admitting: Physician Assistant

## 2017-07-21 DIAGNOSIS — E039 Hypothyroidism, unspecified: Secondary | ICD-10-CM

## 2017-07-31 ENCOUNTER — Other Ambulatory Visit: Payer: Self-pay | Admitting: Internal Medicine

## 2017-08-18 ENCOUNTER — Other Ambulatory Visit: Payer: Self-pay | Admitting: Internal Medicine

## 2017-10-03 ENCOUNTER — Encounter: Payer: Self-pay | Admitting: Internal Medicine

## 2017-10-03 ENCOUNTER — Ambulatory Visit: Payer: Medicare Other | Admitting: Internal Medicine

## 2017-10-03 VITALS — BP 144/60 | HR 57 | Ht 65.75 in | Wt 130.4 lb

## 2017-10-03 DIAGNOSIS — I10 Essential (primary) hypertension: Secondary | ICD-10-CM | POA: Diagnosis not present

## 2017-10-03 DIAGNOSIS — I4891 Unspecified atrial fibrillation: Secondary | ICD-10-CM

## 2017-10-03 NOTE — Progress Notes (Signed)
Cardiology Office Note   Date:  10/03/2017   ID:  Amy [name], DOB 06/02/1935, MRN [id number]  PCP:  [name], PA-C  Cardiologist:   Dorris Carnes, MD    F/U of HTN and PAF     History of Present Illness: Amy [name] is a 82 y.o. female with a history of atrial flutter (s/p ablation), PAF and HTN   Since I saw the pt in clinic she has seen K Auten and M Supple in HTN clinic    BP is better      Pt's breathing is good   No CP  No palpitations  Does note some achinesss        Outpatient Medications Prior to Visit  Medication Sig Dispense Refill  . acetaminophen (TYLENOL) 650 MG CR tablet Take 1,300 mg by mouth 2 (two) times daily as needed for pain.    Marland Kitchen AMBULATORY NON FORMULARY MEDICATION CBD Oil 1 dose under tongue once daily    . atenolol (TENORMIN) 25 MG tablet TAKE 1 TABLET BY MOUTH EVERY DAY 90 tablet 1  . Biotin 5000 MCG CAPS Take by mouth.    . Calcium Carbonate-Vitamin D (CALCIUM + D PO) 1 tab po qd     . diltiazem (CARDIZEM CD) 240 MG 24 hr capsule TAKE ONE CAPSULE BY MOUTH EVERY DAY 90 capsule 1  . flecainide (TAMBOCOR) 50 MG tablet TAKE 1 TABLET BY MOUTH TWICE A DAY 180 tablet 3  . furosemide (LASIX) 20 MG tablet Take 1 tablet (20 mg total) by mouth daily as needed for edema. 30 tablet 6  . hydrALAZINE (APRESOLINE) 50 MG tablet Take 1 tablet (50 mg total) by mouth 3 (three) times daily. 90 tablet 11  . lactulose (CEPHULAC) 20 g packet Take 1 packet (20 g total) by mouth 2 (two) times daily. 60 each 5  . latanoprost (XALATAN) 0.005 % ophthalmic solution Place 1 drop into both eyes daily.  4  . levothyroxine (SYNTHROID, LEVOTHROID) 50 MCG tablet TAKE 1 TABLET (50 MCG TOTAL) BY MOUTH DAILY. 90 tablet 0  . linaclotide (LINZESS) 145 MCG CAPS capsule Take 1 capsule (145 mcg total) by mouth daily before breakfast. 30 capsule 5  . linaclotide (LINZESS) 72 MCG capsule Take 1 capsule (72 mcg total) by mouth daily before breakfast. 30 capsule 11  . losartan (COZAAR)  50 MG tablet Take 1 tablet (50 mg total) by mouth daily. 90 tablet 3  . meloxicam (MOBIC) 15 MG tablet Take 1 tablet (15 mg total) by mouth as needed. 90 tablet 2  . XARELTO 15 MG TABS tablet TAKE 1 TABLET BY MOUTH EVERY DAY WITH SUPPER 90 tablet 2   Facility-Administered Medications Prior to Visit  Medication Dose Route Frequency Provider Last Rate Last Dose  . 0.9 %  sodium chloride infusion  500 mL Intravenous Once Milus Banister, MD         Allergies:   Patient has no known allergies.   Past Medical History:  Diagnosis Date  . Anemia   . Arthritis   . Atrial fibrillation ([place])   . Atrial flutter ([place])    s/p ablation  . HTN (hypertension)   . Hypothyroidism   . Palpitations     Past Surgical History:  Procedure Laterality Date  . A FLUTTER ABLATION    . KNEE ARTHROSCOPY Right   . TUBAL LIGATION       Social History:  The patient  reports that she has never smoked.  She has never used smokeless tobacco. She reports that she does not drink alcohol or use drugs.   Family History:  The patient's family history includes Colon cancer in her brother; Heart disease in her mother; Stroke (age of onset: 93) in her mother; Stroke (age of onset: 4) in her father.    ROS:  Please see the history of present illness. All other systems are reviewed and  Negative to the above problem except as noted.    PHYSICAL EXAM: VS:  BP (!) 144/60   Pulse (!) 57   Ht 5' 5.75" (1.67 m)   Wt 130 lb 6.4 oz (59.1 kg)   LMP  (LMP Unknown)   BMI 21.21 kg/m      GEN: Thin 82 yo , in no acute distress  HEENT: normal  Neck: JVP is normal   No  carotid bruits, or masses Cardiac: RRR; no murmurs, rubs, or gallops,no edema  Respiratory:  clear to auscultation bilaterally, normal work of breathing GI: soft, nontender, nondistended, + BS  No hepatomegaly  MS: no deformity Moving all extremities   Skin: warm and dry, no rash Neuro:  Strength and sensation are intact Psych: euthymic mood, full  affect   EKG:  EKG is ordered today.   SB 57 bpm   RBBB  Lipid Panel    Component Value Date/Time   CHOL 205 (H) 07/29/2016 0920   TRIG 55 07/29/2016 0920   HDL 112 07/29/2016 0920   CHOLHDL 1.8 07/29/2016 0920   CHOLHDL 2.0 06/05/2015 0946   VLDL 10 06/05/2015 0946   LDLCALC 82 07/29/2016 0920   LDLDIRECT 84.9 11/15/2008 0924      Wt Readings from Last 3 Encounters:  10/03/17 130 lb 6.4 oz (59.1 kg)  07/10/17 132 lb 9.6 oz (60.1 kg)  06/13/17 136 lb (61.7 kg)      ASSESSMENT AND PLAN:  1  HTN  BP is better   Looking at her log her BP is low at mid day point   BP 90s to 150s I have asked her to take 2  PAF  Continue on current regimen   F/U in HTN clinic in a couple wkts   Will check CBC and Vit D today     Tentaive f/u with me in 6 months      Current medicines are reviewed at length with the patient today.  The patient does not have concerns regarding medicines.  Signed, Dorris Carnes, MD  10/03/2017 11:32 AM    [address], [place], [place]  [address] Phone: [phone]; Fax: [phone]

## 2017-10-03 NOTE — Patient Instructions (Signed)
Your physician recommends that you continue on your current medications as directed. Please refer to the Current Medication list given to you today.  Your physician recommends that you return for lab work today (Vit D, CBC)  Your physician wants you to follow-up in: 6 months with Dr. Harrington Challenger.  You will receive a reminder letter in the mail two months in advance. If you don't receive a letter, please call our office to schedule the follow-up appointment.

## 2017-10-04 LAB — CBC
Hematocrit: 33.6 % — ABNORMAL LOW (ref 34.0–46.6)
Hemoglobin: 11.4 g/dL (ref 11.1–15.9)
MCH: 31.7 pg (ref 26.6–33.0)
MCHC: 33.9 g/dL (ref 31.5–35.7)
MCV: 93 fL (ref 79–97)
Platelets: 243 x10E3/uL (ref 150–450)
RBC: 3.6 x10E6/uL — ABNORMAL LOW (ref 3.77–5.28)
RDW: 12.5 % (ref 12.3–15.4)
WBC: 7.8 x10E3/uL (ref 3.4–10.8)

## 2017-10-04 LAB — VITAMIN D 25 HYDROXY (VIT D DEFICIENCY, FRACTURES): Vit D, 25-Hydroxy: 21.5 ng/mL — ABNORMAL LOW (ref 30.0–100.0)

## 2017-10-07 ENCOUNTER — Telehealth: Payer: Self-pay | Admitting: Internal Medicine

## 2017-10-07 DIAGNOSIS — R7989 Other specified abnormal findings of blood chemistry: Secondary | ICD-10-CM

## 2017-10-07 NOTE — Telephone Encounter (Signed)
Patient called in  Saw Vit D level   Low I had done a result note for her but it did not get to msg nurse  Recomm Vit D 50, 000 U weakly for 7 wks   Recheck Vit D

## 2017-10-08 MED ORDER — VITAMIN D (ERGOCALCIFEROL) 1.25 MG (50000 UNIT) PO CAPS: 50000.0000 [IU] | ORAL_CAPSULE | ORAL | 0 refills | Status: DC

## 2017-10-08 NOTE — Telephone Encounter (Signed)
Spoke with the pt and informed her that per Dr Harrington Challenger, based on her low Vit D levels, she recommends that she take Vit D 50,000 U po weekly for 7 weeks, then come in for a recheck of her Vit D level.  Confirmed the pharmacy of choice with the pt.  Scheduled the pt to have a recheck of her Vit D level for 11/26/17.  Pt verbalized understanding and agrees with this plan.

## 2017-10-08 NOTE — Addendum Note (Signed)
Addended by: Nuala Alpha on: 10/08/2017 09:31 AM   Modules accepted: Orders

## 2017-10-25 ENCOUNTER — Other Ambulatory Visit: Payer: Self-pay | Admitting: Physician Assistant

## 2017-10-25 DIAGNOSIS — E039 Hypothyroidism, unspecified: Secondary | ICD-10-CM

## 2017-10-27 ENCOUNTER — Telehealth: Payer: Self-pay | Admitting: Internal Medicine

## 2017-10-27 DIAGNOSIS — R7989 Other specified abnormal findings of blood chemistry: Secondary | ICD-10-CM

## 2017-10-27 MED ORDER — LOSARTAN POTASSIUM 25 MG PO TABS: 50.0000 mg | ORAL_TABLET | Freq: Every day | ORAL | 3 refills | Status: DC

## 2017-10-27 MED ORDER — VITAMIN D (ERGOCALCIFEROL) 1.25 MG (50000 UNIT) PO CAPS: 50000.0000 [IU] | ORAL_CAPSULE | ORAL | 0 refills | Status: DC

## 2017-10-27 NOTE — Telephone Encounter (Signed)
Send in Rx for two 25 mg tablets per day   OK to refill D

## 2017-10-27 NOTE — Telephone Encounter (Signed)
[facility] is requesting a refill on vitamin d2 1.25 mg (50,000 unit) and Losartan. Pharmacy stating that Losartan 50 mg tablets is on backorder and pharmacy wanted to know if Losartan 25 mg or Losartan 100 mg tablets could be sent in. Please address

## 2017-10-27 NOTE — Telephone Encounter (Signed)
Pt aware to make appt when she comes back to town

## 2017-10-27 NOTE — Telephone Encounter (Signed)
Refilled Vit D and changed losartan 50 mg daily to losartan 25 mg -2 tabs daily.

## 2017-11-19 ENCOUNTER — Other Ambulatory Visit: Payer: Self-pay | Admitting: Physician Assistant

## 2017-11-19 DIAGNOSIS — E039 Hypothyroidism, unspecified: Secondary | ICD-10-CM

## 2017-11-19 NOTE — Telephone Encounter (Signed)
NTBS. Amy [name]. 30 days given 10/25/17

## 2017-11-19 NOTE — Telephone Encounter (Signed)
Patient is aware,needs to be seen per previous documentation.

## 2017-11-24 ENCOUNTER — Telehealth: Payer: Self-pay | Admitting: Physician Assistant

## 2017-11-24 DIAGNOSIS — E039 Hypothyroidism, unspecified: Secondary | ICD-10-CM

## 2017-11-24 NOTE — Telephone Encounter (Signed)
Okay to order TSH.

## 2017-11-24 NOTE — Telephone Encounter (Signed)
PT will be going to [facility] on Wednesday to have labs done, pt wants to know if we can send order for them to go ahead and draw her thyroid since she will already be having labs drawn. Please call pt to let her know if we can do this, if she doesn't answer okay to leave message.

## 2017-11-24 NOTE — Telephone Encounter (Signed)
Detailed message left that order was placed.

## 2017-11-26 ENCOUNTER — Other Ambulatory Visit: Payer: Medicare Other | Admitting: *Deleted

## 2017-11-26 DIAGNOSIS — R7989 Other specified abnormal findings of blood chemistry: Secondary | ICD-10-CM

## 2017-11-26 DIAGNOSIS — E039 Hypothyroidism, unspecified: Secondary | ICD-10-CM

## 2017-11-26 LAB — TSH: TSH: 2.85 u[IU]/mL (ref 0.450–4.500)

## 2017-11-27 LAB — VITAMIN D 25 HYDROXY (VIT D DEFICIENCY, FRACTURES): Vit D, 25-Hydroxy: 26.2 ng/mL — ABNORMAL LOW (ref 30.0–100.0)

## 2017-11-28 ENCOUNTER — Encounter: Payer: Self-pay | Admitting: Physician Assistant

## 2017-11-28 ENCOUNTER — Other Ambulatory Visit: Payer: Self-pay | Admitting: *Deleted

## 2017-11-28 DIAGNOSIS — R7989 Other specified abnormal findings of blood chemistry: Secondary | ICD-10-CM

## 2017-11-28 MED ORDER — VITAMIN D (ERGOCALCIFEROL) 1.25 MG (50000 UNIT) PO CAPS: 50000.0000 [IU] | ORAL_CAPSULE | ORAL | 0 refills | Status: DC

## 2017-11-30 ENCOUNTER — Other Ambulatory Visit: Payer: Self-pay | Admitting: Physician Assistant

## 2017-12-01 ENCOUNTER — Other Ambulatory Visit: Payer: Self-pay | Admitting: *Deleted

## 2017-12-01 DIAGNOSIS — E039 Hypothyroidism, unspecified: Secondary | ICD-10-CM

## 2017-12-01 MED ORDER — LEVOTHYROXINE SODIUM 50 MCG PO TABS: 50.0000 ug | ORAL_TABLET | Freq: Every day | ORAL | 1 refills | Status: DC

## 2017-12-09 ENCOUNTER — Telehealth: Payer: Self-pay | Admitting: Internal Medicine

## 2017-12-09 NOTE — Telephone Encounter (Signed)
Patient wrote in   Difficulty with losartan Hard to get   Recomm:   Could cut 100 in 1/2     Switch to Valsartan    Would prob talk with her pharmacist

## 2017-12-10 NOTE — Telephone Encounter (Signed)
Replied to patient via her MyChart message ?

## 2017-12-10 NOTE — Telephone Encounter (Signed)
Called [facility]. The 50 mg tablet of losartan is not available.   They have prescription on file for losartan 25 mg with correct instructions to take 50 mg daily. They will fill this.  Patient made aware via MyChart reply.

## 2017-12-15 ENCOUNTER — Telehealth: Payer: Self-pay | Admitting: Pharmacist

## 2017-12-15 NOTE — Telephone Encounter (Signed)
Pt called to report that blood pressure has been BP 170/90s in early evening, otherwise she has been running in the normal range per her report.   She takes hydralazine at 8am, 5pm, and 11pm. Will adjust mid dose to earlier 3-4pm to see if this controls elevated evening pressures.   She will call if no improvement in 1-2 weeks with timing adjustment so that we can adjust doses if needed.

## 2018-01-22 ENCOUNTER — Telehealth: Payer: Self-pay | Admitting: Internal Medicine

## 2018-01-22 DIAGNOSIS — R7989 Other specified abnormal findings of blood chemistry: Secondary | ICD-10-CM

## 2018-01-22 NOTE — Telephone Encounter (Signed)
Please set pt up for Vit D level

## 2018-01-23 ENCOUNTER — Other Ambulatory Visit: Payer: Medicare Other | Admitting: *Deleted

## 2018-01-23 DIAGNOSIS — R7989 Other specified abnormal findings of blood chemistry: Secondary | ICD-10-CM

## 2018-01-23 NOTE — Telephone Encounter (Signed)
Spoke with the pt and informed her that Dr Harrington Challenger would like for her to come in for lab work, to recheck her Vitamin D level.  Scheduled the pt for today 01/23/18, to recheck her Vit D Level.  Pt verbalized understanding and agreed with this plan.

## 2018-01-23 NOTE — Addendum Note (Signed)
Addended by: Nuala Alpha on: 01/23/2018 08:41 AM   Modules accepted: Orders

## 2018-01-24 LAB — VITAMIN D 25 HYDROXY (VIT D DEFICIENCY, FRACTURES): VIT D 25 HYDROXY: 31.1 ng/mL (ref 30.0–100.0)

## 2018-01-26 ENCOUNTER — Other Ambulatory Visit: Payer: Self-pay | Admitting: Internal Medicine

## 2018-01-27 MED ORDER — CHOLECALCIFEROL 100 MCG (4000 UT) PO CAPS: 1.0000 | ORAL_CAPSULE | Freq: Every day | ORAL | Status: DC

## 2018-02-25 ENCOUNTER — Other Ambulatory Visit: Payer: Self-pay | Admitting: Internal Medicine

## 2018-03-13 ENCOUNTER — Telehealth: Payer: Self-pay | Admitting: Physician Assistant

## 2018-03-13 NOTE — Telephone Encounter (Signed)
Yes, it is okay to do DEXA tomorrow

## 2018-03-13 NOTE — Telephone Encounter (Signed)
Verbal Order given  

## 2018-03-19 ENCOUNTER — Encounter: Payer: Self-pay | Admitting: Physician Assistant

## 2018-03-25 ENCOUNTER — Encounter: Payer: Self-pay | Admitting: Physician Assistant

## 2018-03-25 ENCOUNTER — Ambulatory Visit: Payer: Medicare Other | Admitting: Physician Assistant

## 2018-03-25 VITALS — BP 187/77 | HR 58 | Temp 96.5°F | Ht 65.75 in | Wt 134.2 lb

## 2018-03-25 DIAGNOSIS — B029 Zoster without complications: Secondary | ICD-10-CM | POA: Diagnosis not present

## 2018-03-26 NOTE — Progress Notes (Signed)
BP (!) 187/77   Pulse (!) 58   Temp (!) 96.5 F (35.8 C) (Oral)   Ht 5' 5.75" (1.67 m)   Wt 134 lb 3.2 oz (60.9 kg)   LMP  (LMP Unknown)   BMI 21.83 kg/m    Subjective:    Patient ID: Amy [name], female    DOB: May 06, 1935, 83 y.o.   MRN: [id number]  HPI: Amy [name] is a 83 y.o. female presenting on 03/25/2018 for Herpes Zoster  She started 4-5 days with burning and then a rash. The blisters are already gone. Denies severe pain, no fever, no NVD.  She did not get a vaccine.   Past Medical History:  Diagnosis Date  . Anemia   . Arthritis   . Atrial fibrillation ([place])   . Atrial flutter ([place])    s/p ablation  . HTN (hypertension)   . Hypothyroidism   . Palpitations    Relevant past medical, surgical, family and social history reviewed and updated as indicated. Interim medical history since our last visit reviewed. Allergies and medications reviewed and updated. DATA REVIEWED: CHART IN EPIC  Family History reviewed for pertinent findings.  Review of Systems  Constitutional: Negative.   HENT: Negative.   Eyes: Negative.   Respiratory: Negative.   Gastrointestinal: Negative.   Genitourinary: Negative.   Skin: Positive for color change and rash.    Allergies as of 03/25/2018   No Known Allergies     Medication List       Accurate as of March 25, 2018 11:59 PM. Always use your most recent med list.        acetaminophen 650 MG CR tablet Commonly known as:  TYLENOL Take 1,300 mg by mouth 2 (two) times daily as needed for pain.   AMBULATORY NON FORMULARY MEDICATION CBD Oil 1 dose under tongue once daily   atenolol 25 MG tablet Commonly known as:  TENORMIN TAKE 1 TABLET BY MOUTH EVERY DAY   Biotin 5000 MCG Caps Take by mouth.   CALCIUM + D PO 1 tab po qd   Cholecalciferol 100 MCG (4000 UT) Caps Take 1 capsule (4,000 Units total) by mouth daily.   diltiazem 240 MG 24 hr capsule Commonly known as:  CARDIZEM CD TAKE ONE CAPSULE BY MOUTH EVERY DAY   flecainide 50 MG tablet Commonly known as:  TAMBOCOR TAKE 1 TABLET BY MOUTH TWICE A DAY   furosemide 20 MG tablet Commonly known as:  LASIX Take 1 tablet (20 mg total) by mouth daily as needed for edema.   hydrALAZINE 50 MG tablet Commonly known as:  APRESOLINE Take 1 tablet (50 mg total) by mouth 3 (three) times daily.   latanoprost 0.005 % ophthalmic solution Commonly known as:  XALATAN Place 1 drop into both eyes daily.   levothyroxine 50 MCG tablet Commonly known as:  SYNTHROID, LEVOTHROID Take 1 tablet (50 mcg total) by mouth daily.   losartan 25 MG tablet Commonly known as:  COZAAR Take 2 tablets (50 mg total) by mouth daily.   meloxicam 15 MG tablet Commonly known as:  Mobic Take 1 tablet (15 mg total) by mouth as needed.   Xarelto 15 MG Tabs tablet Generic drug:  Rivaroxaban TAKE 1 TABLET BY MOUTH EVERY DAY WITH SUPPER          Objective:    BP (!) 187/77   Pulse (!) 58   Temp (!) 96.5 F (35.8 C) (Oral)   Ht 5' 5.75" (1.67 m)  Wt 134 lb 3.2 oz (60.9 kg)   LMP  (LMP Unknown)   BMI 21.83 kg/m   No Known Allergies  Wt Readings from Last 3 Encounters:  03/25/18 134 lb 3.2 oz (60.9 kg)  10/03/17 130 lb 6.4 oz (59.1 kg)  07/10/17 132 lb 9.6 oz (60.1 kg)    Physical Exam Constitutional:      Appearance: She is well-developed.  HENT:     Head: Normocephalic and atraumatic.  Eyes:     Conjunctiva/sclera: Conjunctivae normal.     Pupils: Pupils are equal, round, and reactive to light.  Cardiovascular:     Rate and Rhythm: Normal rate and regular rhythm.     Heart sounds: Normal heart sounds.  Pulmonary:     Effort: Pulmonary effort is normal.     Breath sounds: Normal breath sounds.  Abdominal:     General: Bowel sounds are normal.     Palpations: Abdomen is soft.  Skin:    General: Skin is warm and dry.     Findings: Erythema and rash present. Rash is macular.       Neurological:     Mental Status: She is alert and oriented to person,  place, and time.     Deep Tendon Reflexes: Reflexes are normal and symmetric.  Psychiatric:        Behavior: Behavior normal.        Thought Content: Thought content normal.        Judgment: Judgment normal.     Results for orders placed or performed in visit on 01/23/18  Vitamin D (25 hydroxy)  Result Value Ref Range   Vit D, 25-Hydroxy 31.1 30.0 - 100.0 ng/mL      Assessment & Plan:   1. Herpes zoster without complication Supportive care Call if any pain begins   Continue all other maintenance medications as listed above.  Follow up plan: No follow-ups on file.  Educational handout given for [place] PA-C [place] [address]  [place], [place] [address] [phone]   03/26/2018, 8:49 PM

## 2018-04-08 ENCOUNTER — Telehealth: Payer: Self-pay | Admitting: Internal Medicine

## 2018-04-08 MED ORDER — FLECAINIDE ACETATE 50 MG PO TABS: 50.0000 mg | ORAL_TABLET | Freq: Two times a day (BID) | ORAL | 3 refills | Status: DC

## 2018-04-08 MED ORDER — RIVAROXABAN 15 MG PO TABS: ORAL_TABLET | ORAL | 2 refills | Status: DC

## 2018-04-08 MED ORDER — HYDRALAZINE HCL 50 MG PO TABS: 50.0000 mg | ORAL_TABLET | Freq: Three times a day (TID) | ORAL | 11 refills | Status: DC

## 2018-04-08 NOTE — Addendum Note (Signed)
Addended by: Rodman Key on: 04/08/2018 03:00 PM   Modules accepted: Orders

## 2018-04-08 NOTE — Telephone Encounter (Addendum)
Cancelled appointment for 3/23. Refilled medications flecainide, xarelto and hydralazine  Routed to r/s pool.

## 2018-04-08 NOTE — Telephone Encounter (Signed)
Spoke to pt  She and her husband are doing OK With corona virus would recomm rescheduling to later this spring when infectious conditions improve Pt understands Will need check on refills  APpt end of may

## 2018-04-13 ENCOUNTER — Ambulatory Visit: Payer: Medicare Other | Admitting: Internal Medicine

## 2018-04-17 ENCOUNTER — Encounter: Payer: Self-pay | Admitting: Family Medicine

## 2018-04-20 ENCOUNTER — Other Ambulatory Visit: Payer: Self-pay | Admitting: *Deleted

## 2018-04-20 MED ORDER — DICLOFENAC SODIUM 2 % TD SOLN: TRANSDERMAL | 3 refills | Status: DC

## 2018-04-23 ENCOUNTER — Encounter: Payer: Self-pay | Admitting: Family Medicine

## 2018-04-23 MED ORDER — DICLOFENAC SODIUM 1 % TD GEL: 2.0000 g | Freq: Four times a day (QID) | TRANSDERMAL | 11 refills | Status: DC

## 2018-04-30 ENCOUNTER — Telehealth: Payer: Self-pay

## 2018-04-30 NOTE — Telephone Encounter (Signed)
Pts March appt canceled d/t COVID Rescheduled for OV with Dr. Harrington Challenger on Aug 20th.

## 2018-04-30 NOTE — Telephone Encounter (Signed)
Called Amy [name] to reschedule her and her husband, Amy [name], appointments which were canceled in March d/t COVID.  Pt and spouse need to be scheduled on same day for OV with Dr. Harrington Challenger in August or September.

## 2018-05-21 ENCOUNTER — Other Ambulatory Visit: Payer: Self-pay | Admitting: Physician Assistant

## 2018-05-21 DIAGNOSIS — E039 Hypothyroidism, unspecified: Secondary | ICD-10-CM

## 2018-06-04 ENCOUNTER — Other Ambulatory Visit: Payer: Self-pay

## 2018-06-04 MED ORDER — LINACLOTIDE 72 MCG PO CAPS: 72.0000 ug | ORAL_CAPSULE | Freq: Every day | ORAL | 6 refills | Status: DC

## 2018-06-05 ENCOUNTER — Other Ambulatory Visit: Payer: Self-pay

## 2018-06-05 MED ORDER — LINACLOTIDE 72 MCG PO CAPS: 72.0000 ug | ORAL_CAPSULE | Freq: Every day | ORAL | 1 refills | Status: DC

## 2018-06-14 IMAGING — DX DG ABDOMEN 1V
1 series · 1 of 1 positions shown · non-contrast
Comparison: None.

CLINICAL DATA: Left-sided flank pain, constipation

EXAM:
ABDOMEN - 1 VIEW

[abdomen kub]
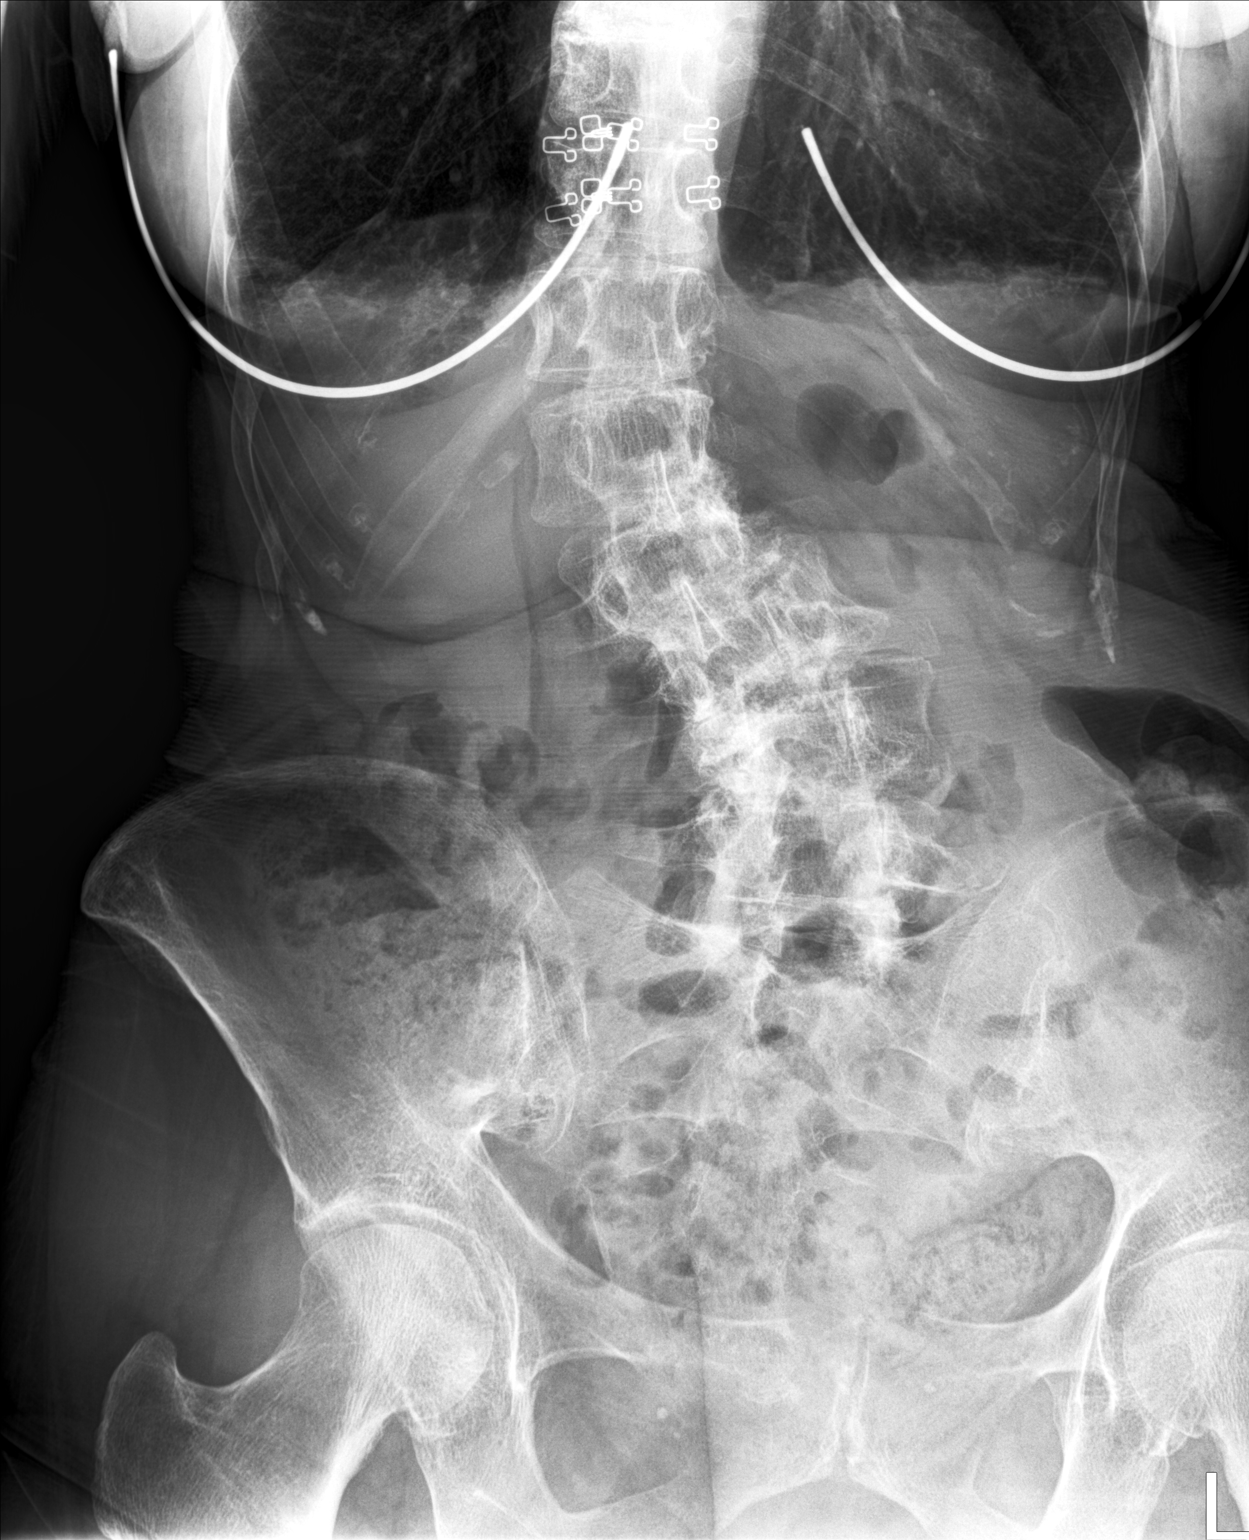

[1 of 1 positions shown; findings below may reference images not displayed]

FINDINGS: A supine film the abdomen shows no bowel obstruction. There is a
moderate amount of feces throughout the colon. There is significant
thoracolumbar scoliosis present with diffuse osteopenia and
degenerative change. No opaque calculi are evident.
IMPRESSION: 1. No bowel obstruction. Moderate amount of feces throughout the
colon.
2. No definite opaque calculi are noted.
3. Significant thoracolumbar scoliosis with osteopenia and
degenerative change.

## 2018-06-23 ENCOUNTER — Ambulatory Visit: Payer: Medicare Other | Admitting: Physician Assistant

## 2018-09-05 ENCOUNTER — Other Ambulatory Visit: Payer: Self-pay | Admitting: Internal Medicine

## 2018-09-10 ENCOUNTER — Other Ambulatory Visit: Payer: Self-pay

## 2018-09-10 ENCOUNTER — Encounter: Payer: Self-pay | Admitting: Internal Medicine

## 2018-09-10 ENCOUNTER — Ambulatory Visit (INDEPENDENT_AMBULATORY_CARE_PROVIDER_SITE_OTHER): Payer: Medicare Other | Admitting: Internal Medicine

## 2018-09-10 VITALS — BP 129/57 | HR 49 | Ht 65.75 in | Wt 132.4 lb

## 2018-09-10 DIAGNOSIS — I480 Paroxysmal atrial fibrillation: Secondary | ICD-10-CM

## 2018-09-10 DIAGNOSIS — I10 Essential (primary) hypertension: Secondary | ICD-10-CM | POA: Diagnosis not present

## 2018-09-10 DIAGNOSIS — E039 Hypothyroidism, unspecified: Secondary | ICD-10-CM | POA: Diagnosis not present

## 2018-09-10 DIAGNOSIS — R7989 Other specified abnormal findings of blood chemistry: Secondary | ICD-10-CM | POA: Diagnosis not present

## 2018-09-10 NOTE — Patient Instructions (Addendum)
Medication Instructions:  No changes today If you need a refill on your cardiac medications before your next appointment, please call your pharmacy.   Lab work: Today: BMET, TSH, CBC, LIPIDS, VIT D If you have labs (blood work) drawn today and your tests are completely normal, you will receive your results only by: Marland Kitchen MyChart Message (if you have MyChart) OR . A paper copy in the mail If you have any lab test that is abnormal or we need to change your treatment, we will call you to review the results.  Testing/Procedures: NONE  Follow-Up: At [facility], you and your health needs are our priority.  As part of our continuing mission to provide you with exceptional heart care, we have created designated Provider Care Teams.  These Care Teams include your primary Cardiologist (physician) and Advanced Practice Providers (APPs -  Physician Assistants and Nurse Practitioners) who all work together to provide you with the care you need, when you need it. You will need a follow up appointment in:  6 months.  Please call our office 2 months in advance to schedule this appointment.  You may see Dorris Carnes, MD or one of the following Advanced Practice Providers on your designated Care Team: Richardson Dopp, PA-C [place], [place] . Daune Perch, NP  Any Other Special Instructions Will Be Listed Below (If Applicable).

## 2018-09-10 NOTE — Progress Notes (Signed)
Cardiology Office Note   Date:  09/10/2018   ID:  Amy [name], DOB 1935-08-31, MRN [id number]  PCP:  [name], PA-C  Cardiologist:   Dorris Carnes, MD    F/U of HTN and PAF     History of Present Illness: Amy [name] is a 83 y.o. female with a history of atrial flutter (s/p ablation), PAF and HTN  Since I saw her she has done well   Pt's breathing is good   No CP Denies palpitations  Brings in some readings of her BP   There are days when her BP is low   80s/ . Most are at mid day   Interesting, BP is lower when she is at cabin in [place] when here in [place] She does admit to not drinking a lot some days       Outpatient Medications Prior to Visit  Medication Sig Dispense Refill  . acetaminophen (TYLENOL) 650 MG CR tablet Take 1,300 mg by mouth 2 (two) times daily as needed for pain.    Marland Kitchen AMBULATORY NON FORMULARY MEDICATION CBD Oil 1 dose under tongue once daily    . atenolol (TENORMIN) 25 MG tablet TAKE 1 TABLET BY MOUTH EVERY DAY 90 tablet 2  . Biotin 5000 MCG CAPS Take by mouth.    . Calcium Carbonate-Vitamin D (CALCIUM + D PO) 1 tab po qd     . Cholecalciferol 100 MCG (4000 UT) CAPS Take 1 capsule (4,000 Units total) by mouth daily. 30 capsule   . diclofenac sodium (VOLTAREN) 1 % GEL Apply 2 g topically 4 (four) times daily. To affected joint. 300 g 11  . diltiazem (CARDIZEM CD) 240 MG 24 hr capsule TAKE ONE CAPSULE BY MOUTH EVERY DAY 90 capsule 2  . flecainide (TAMBOCOR) 50 MG tablet Take 1 tablet (50 mg total) by mouth 2 (two) times daily. 180 tablet 3  . furosemide (LASIX) 20 MG tablet Take 1 tablet (20 mg total) by mouth daily as needed for edema. 30 tablet 6  . hydrALAZINE (APRESOLINE) 50 MG tablet Take 1 tablet (50 mg total) by mouth 3 (three) times daily. 90 tablet 11  . latanoprost (XALATAN) 0.005 % ophthalmic solution Place 1 drop into both eyes daily.  4  . levothyroxine (SYNTHROID) 50 MCG tablet TAKE 1 TABLET BY MOUTH EVERY DAY 90 tablet 3  .  LINZESS 145 MCG CAPS capsule Take 145 mcg by mouth daily.    Marland Kitchen losartan (COZAAR) 25 MG tablet TAKE 2 TABLETS BY MOUTH EVERY DAY 180 tablet 0  . Rivaroxaban (XARELTO) 15 MG TABS tablet TAKE 1 TABLET BY MOUTH EVERY DAY WITH SUPPER (Patient taking differently: every other day. TAKE 1 TABLET BY MOUTH EVERY DAY WITH SUPPER) 90 tablet 2  . linaclotide (LINZESS) 72 MCG capsule Take 1 capsule (72 mcg total) by mouth daily before breakfast for 30 days. (Patient not taking: Reported on 09/10/2018) 30 capsule 1  . meloxicam (MOBIC) 15 MG tablet Take 1 tablet (15 mg total) by mouth as needed. (Patient not taking: Reported on 09/10/2018) 90 tablet 2   Facility-Administered Medications Prior to Visit  Medication Dose Route Frequency Provider Last Rate Last Dose  . 0.9 %  sodium chloride infusion  500 mL Intravenous Once Milus Banister, MD         Allergies:   Patient has no known allergies.   Past Medical History:  Diagnosis Date  . Anemia   . Arthritis   .  Atrial fibrillation ([place])   . Atrial flutter ([place])    s/p ablation  . HTN (hypertension)   . Hypothyroidism   . Palpitations     Past Surgical History:  Procedure Laterality Date  . A FLUTTER ABLATION    . KNEE ARTHROSCOPY Right   . TUBAL LIGATION       Social History:  The patient  reports that she has never smoked. She has never used smokeless tobacco. She reports that she does not drink alcohol or use drugs.   Family History:  The patient's family history includes Colon cancer in her brother; Heart disease in her mother; Stroke (age of onset: 69) in her mother; Stroke (age of onset: 30) in her father.    ROS:  Please see the history of present illness. All other systems are reviewed and  Negative to the above problem except as noted.    PHYSICAL EXAM: VS:  BP (!) 129/57   Pulse (!) 49   Ht 5' 5.75" (1.67 m)   Wt 132 lb 6.4 oz (60.1 kg)   LMP  (LMP Unknown)   BMI 21.53 kg/m      GEN: Thin 83 yo , in no acute distress   HEENT: normal  Neck: JVP is normal   No  carotid bruits  Cardiac: RRR; no murmurs, rubs, or gallops,no edema  Respiratory:  clear to auscultation bilaterally, normal work of breathing GI: soft, nontender, nondistended, + BS  No hepatomegaly  MS: no deformity Moving all extremities   Skin: warm and dry, no rash Neuro:  Strength and sensation are intact Psych: euthymic mood, full affect   EKG:  EKG is not ordered today.    Lipid Panel    Component Value Date/Time   CHOL 205 (H) 07/29/2016 0920   TRIG 55 07/29/2016 0920   HDL 112 07/29/2016 0920   CHOLHDL 1.8 07/29/2016 0920   CHOLHDL 2.0 06/05/2015 0946   VLDL 10 06/05/2015 0946   LDLCALC 82 07/29/2016 0920   LDLDIRECT 84.9 11/15/2008 0924      Wt Readings from Last 3 Encounters:  09/10/18 132 lb 6.4 oz (60.1 kg)  03/25/18 134 lb 3.2 oz (60.9 kg)  10/03/17 130 lb 6.4 oz (59.1 kg)      ASSESSMENT AND PLAN:  1  HTN  BP is down at times   Would cut back on hydralazine to 25 mg tid   Stay hydrated    Increase hydration.   Follow BP  2  PAF  Keep on same regimen  Pt does admit to actually taking Xarelto every other day    We have reviewed in past the increased risk if she is in /out of afib   She has not sensed any paliptiations, but again, this may still miss  Will check CBC, BMET, lipids and Vit D  Tentaive f/u with me in 6 months      Current medicines are reviewed at length with the patient today.  The patient does not have concerns regarding medicines.  Signed, Dorris Carnes, MD  09/10/2018 10:29 AM    [facility] [place], [place], [place]  [address] Phone: [phone]; Fax: [phone]

## 2018-09-11 LAB — BASIC METABOLIC PANEL
BUN/Creatinine Ratio: 15 (ref 12–28)
BUN: 23 mg/dL (ref 8–27)
CO2: 21 mmol/L (ref 20–29)
Calcium: 9.7 mg/dL (ref 8.7–10.3)
Chloride: 104 mmol/L (ref 96–106)
Creatinine, Ser: 1.49 mg/dL — ABNORMAL HIGH (ref 0.57–1.00)
GFR calc Af Amer: 38 mL/min/{1.73_m2} — ABNORMAL LOW (ref >59)
GFR calc non Af Amer: 33 mL/min/{1.73_m2} — ABNORMAL LOW (ref >59)
Glucose: 87 mg/dL (ref 65–99)
Potassium: 4.8 mmol/L (ref 3.5–5.2)
Sodium: 141 mmol/L (ref 134–144)

## 2018-09-11 LAB — TSH: TSH: 2.64 u[IU]/mL (ref 0.450–4.500)

## 2018-09-11 LAB — LIPID PANEL
Chol/HDL Ratio: 1.8 ratio (ref 0.0–4.4)
Cholesterol, Total: 184 mg/dL (ref 100–199)
HDL: 101 mg/dL (ref >39)
LDL Calculated: 67 mg/dL (ref 0–99)
Triglycerides: 81 mg/dL (ref 0–149)
VLDL Cholesterol Cal: 16 mg/dL (ref 5–40)

## 2018-09-11 LAB — CBC
Hematocrit: 36.2 % (ref 34.0–46.6)
Hemoglobin: 11.5 g/dL (ref 11.1–15.9)
MCH: 31.9 pg (ref 26.6–33.0)
MCHC: 31.8 g/dL (ref 31.5–35.7)
MCV: 100 fL — ABNORMAL HIGH (ref 79–97)
Platelets: 229 10*3/uL (ref 150–450)
RBC: 3.61 x10E6/uL — ABNORMAL LOW (ref 3.77–5.28)
RDW: 11.9 % (ref 11.7–15.4)
WBC: 9.5 10*3/uL (ref 3.4–10.8)

## 2018-09-11 LAB — VITAMIN D 25 HYDROXY (VIT D DEFICIENCY, FRACTURES): Vit D, 25-Hydroxy: 36.6 ng/mL (ref 30.0–100.0)

## 2018-10-13 ENCOUNTER — Other Ambulatory Visit: Payer: Self-pay | Admitting: Internal Medicine

## 2018-11-11 ENCOUNTER — Other Ambulatory Visit: Payer: Self-pay | Admitting: Internal Medicine

## 2018-11-12 ENCOUNTER — Other Ambulatory Visit: Payer: Self-pay | Admitting: Internal Medicine

## 2018-11-12 MED ORDER — ATENOLOL 25 MG PO TABS: 25.0000 mg | ORAL_TABLET | Freq: Every day | ORAL | 2 refills | Status: DC

## 2018-11-14 ENCOUNTER — Other Ambulatory Visit: Payer: Self-pay | Admitting: Internal Medicine

## 2018-11-16 LAB — HM DIABETES EYE EXAM

## 2018-12-02 ENCOUNTER — Other Ambulatory Visit: Payer: Self-pay | Admitting: Internal Medicine

## 2019-02-14 ENCOUNTER — Ambulatory Visit: Payer: Medicare PPO | Attending: Internal Medicine

## 2019-02-14 DIAGNOSIS — Z23 Encounter for immunization: Secondary | ICD-10-CM | POA: Insufficient documentation

## 2019-02-14 NOTE — Progress Notes (Signed)
   Covid-19 Vaccination Clinic  Name:  Amy [name]    MRN: [id number] DOB: Jan 04, 1936  02/14/2019  [name] was observed post Covid-19 immunization for 15 minutes without incidence. She was provided with Vaccine Information Sheet and instruction to access the V-Safe system.   [name] was instructed to call 911 with any severe reactions post vaccine: Marland Kitchen Difficulty breathing  . Swelling of your face and throat  . A fast heartbeat  . A bad rash all over your body  . Dizziness and weakness    Immunizations Administered    Name Date Dose VIS Date Route   Pfizer COVID-19 Vaccine 02/14/2019 10:31 AM 0.3 mL 01/01/2019 Intramuscular   Manufacturer: [place]   Lot: [id number]   [place]: [id number]

## 2019-03-08 ENCOUNTER — Ambulatory Visit: Payer: Medicare PPO | Attending: Internal Medicine

## 2019-03-08 DIAGNOSIS — Z23 Encounter for immunization: Secondary | ICD-10-CM | POA: Insufficient documentation

## 2019-03-08 NOTE — Progress Notes (Signed)
   Covid-19 Vaccination Clinic  Name:  Amy [name]    MRN: [id number] DOB: 08/15/1935  03/08/2019  [name] was observed post Covid-19 immunization for 15 minutes without incidence. She was provided with Vaccine Information Sheet and instruction to access the V-Safe system.   [name] was instructed to call 911 with any severe reactions post vaccine: Marland Kitchen Difficulty breathing  . Swelling of your face and throat  . A fast heartbeat  . A bad rash all over your body  . Dizziness and weakness    Immunizations Administered    Name Date Dose VIS Date Route   Pfizer COVID-19 Vaccine 03/08/2019  8:58 AM 0.3 mL 01/01/2019 Intramuscular   Manufacturer: [place]   Lot: [id number]   [place]: [id number]

## 2019-03-12 ENCOUNTER — Encounter: Payer: Self-pay | Admitting: Internal Medicine

## 2019-03-12 ENCOUNTER — Ambulatory Visit: Payer: Medicare PPO | Admitting: Internal Medicine

## 2019-03-12 ENCOUNTER — Other Ambulatory Visit: Payer: Self-pay

## 2019-03-12 VITALS — BP 150/80 | HR 51 | Ht 65.75 in | Wt 132.4 lb

## 2019-03-12 DIAGNOSIS — I480 Paroxysmal atrial fibrillation: Secondary | ICD-10-CM

## 2019-03-12 DIAGNOSIS — E039 Hypothyroidism, unspecified: Secondary | ICD-10-CM | POA: Diagnosis not present

## 2019-03-12 DIAGNOSIS — R7989 Other specified abnormal findings of blood chemistry: Secondary | ICD-10-CM

## 2019-03-12 DIAGNOSIS — I10 Essential (primary) hypertension: Secondary | ICD-10-CM

## 2019-03-12 NOTE — Progress Notes (Signed)
Cardiology Office Note   Date:  03/12/2019   ID:  Amy [name], DOB 11-28-1935, MRN [id number]  PCP:  [name], PA-C  Cardiologist:   Dorris Carnes, MD    F/U of HTN and PAF     History of Present Illness: Amy [name] is a 84 y.o. female with a history of atrial flutter (s/p ablation), PAF and HTN  Since I saw her last she says she has felt OK  She denies palpitations   Breathing is good    Denies CP    BP has been up and down   120s to 180s  /   Mostly in 140s to 150s  Outpatient Medications Prior to Visit  Medication Sig Dispense Refill  . acetaminophen (TYLENOL) 650 MG CR tablet Take 1,300 mg by mouth 2 (two) times daily as needed for pain.    Marland Kitchen AMBULATORY NON FORMULARY MEDICATION CBD Oil 1 dose under tongue once daily    . atenolol (TENORMIN) 25 MG tablet TAKE 1 TABLET BY MOUTH EVERY DAY 90 tablet 2  . Biotin 5000 MCG CAPS Take by mouth.    . Calcium Carbonate-Vitamin D (CALCIUM + D PO) 1 tab po qd     . Cholecalciferol 100 MCG (4000 UT) CAPS Take 1 capsule (4,000 Units total) by mouth daily. 30 capsule   . diclofenac sodium (VOLTAREN) 1 % GEL Apply 2 g topically 4 (four) times daily. To affected joint. 300 g 11  . diltiazem (CARDIZEM CD) 240 MG 24 hr capsule TAKE 1 CAPSULE BY MOUTH EVERY DAY 90 capsule 3  . flecainide (TAMBOCOR) 50 MG tablet Take 1 tablet (50 mg total) by mouth 2 (two) times daily. 180 tablet 3  . furosemide (LASIX) 20 MG tablet Take 1 tablet (20 mg total) by mouth daily as needed for edema. 30 tablet 6  . hydrALAZINE (APRESOLINE) 50 MG tablet TAKE 1 TABLET BY MOUTH THREE TIMES A DAY 270 tablet 2  . latanoprost (XALATAN) 0.005 % ophthalmic solution Place 1 drop into both eyes daily.  4  . levothyroxine (SYNTHROID) 50 MCG tablet TAKE 1 TABLET BY MOUTH EVERY DAY 90 tablet 3  . LINZESS 145 MCG CAPS capsule Take 145 mcg by mouth daily.    Marland Kitchen losartan (COZAAR) 25 MG tablet TAKE 2 TABLETS BY MOUTH EVERY DAY 180 tablet 2  . Rivaroxaban (XARELTO) 15 MG TABS  tablet TAKE 1 TABLET BY MOUTH EVERY DAY WITH SUPPER (Patient taking differently: every other day. TAKE 1 TABLET BY MOUTH EVERY DAY WITH SUPPER) 90 tablet 2   Facility-Administered Medications Prior to Visit  Medication Dose Route Frequency Provider Last Rate Last Admin  . 0.9 %  sodium chloride infusion  500 mL Intravenous Once Milus Banister, MD         Allergies:   Patient has no known allergies.   Past Medical History:  Diagnosis Date  . Anemia   . Arthritis   . Atrial fibrillation ([place])   . Atrial flutter ([place])    s/p ablation  . HTN (hypertension)   . Hypothyroidism   . Palpitations     Past Surgical History:  Procedure Laterality Date  . A FLUTTER ABLATION    . KNEE ARTHROSCOPY Right   . TUBAL LIGATION       Social History:  The patient  reports that she has never smoked. She has never used smokeless tobacco. She reports that she does not drink alcohol or use drugs.   Family  History:  The patient's family history includes Colon cancer in her brother; Heart disease in her mother; Stroke (age of onset: 55) in her mother; Stroke (age of onset: 61) in her father.    ROS:  Please see the history of present illness. All other systems are reviewed and  Negative to the above problem except as noted.    PHYSICAL EXAM: VS:  BP (!) 150/80   Pulse (!) 51   Ht 5' 5.75" (1.67 m)   Wt 132 lb 6.4 oz (60.1 kg)   LMP  (LMP Unknown)   BMI 21.53 kg/m      GEN: Pt is in NAD  HEENT: normal  Neck: JVP is normal   No  carotid bruits  Cardiac: RRR; no murmurs, rubs, or gallops,no LE edema  Respiratory:  clear to auscultation bilaterally, normal work of breathing GI: soft, nontender, nondistended, + BS  No hepatomegaly  MS: no deformity Moving all extremities   Skin: warm and dry, no rash Neuro:  Strength and sensation are intact Psych: euthymic mood, full affect   EKG:  EKG is ordered today   SB 51 bpm   First degree AV block  PR 206 msec   RBBB (unchanged) Lipid Panel      Component Value Date/Time   CHOL 184 09/10/2018 1136   TRIG 81 09/10/2018 1136   HDL 101 09/10/2018 1136   CHOLHDL 1.8 09/10/2018 1136   CHOLHDL 2.0 06/05/2015 0946   VLDL 10 06/05/2015 0946   LDLCALC 67 09/10/2018 1136   LDLDIRECT 84.9 11/15/2008 0924      Wt Readings from Last 3 Encounters:  03/12/19 132 lb 6.4 oz (60.1 kg)  09/10/18 132 lb 6.4 oz (60.1 kg)  03/25/18 134 lb 3.2 oz (60.9 kg)      ASSESSMENT AND PLAN:  1  HTN  BP is labile, some quite high   I would recomm increasing hydralazine back to 50-25-25   Follow BP    2  PAF Denies palpitations    Continue current regimen   Will check labs today    Tentaive f/u in the fall       Current medicines are reviewed at length with the patient today.  The patient does not have concerns regarding medicines.  Signed, Dorris Carnes, MD  03/12/2019 3:06 PM    [facility] [place], [place], [place]  [address] Phone: [phone]; Fax: [phone]

## 2019-03-12 NOTE — Patient Instructions (Signed)
Medication Instructions:  Hydralazine 50 mg tablet--take 50 mg every morning, 25 mg every afternoon, 25 mg every evening  *If you need a refill on your cardiac medications before your next appointment, please call your pharmacy*  Lab Work: Today: cbc, bmet, vit d, tsh If you have labs (blood work) drawn today and your tests are completely normal, you will receive your results only by: Marland Kitchen MyChart Message (if you have MyChart) OR . A paper copy in the mail If you have any lab test that is abnormal or we need to change your treatment, we will call you to review the results.  Follow-Up: At [facility], you and your health needs are our priority.  As part of our continuing mission to provide you with exceptional heart care, we have created designated Provider Care Teams.  These Care Teams include your primary Cardiologist (physician) and Advanced Practice Providers (APPs -  Physician Assistants and Nurse Practitioners) who all work together to provide you with the care you need, when you need it.  Your next appointment:   6 month(s)  The format for your next appointment:   In Person  Provider:   Dorris Carnes, MD  Other Instructions

## 2019-03-13 LAB — BASIC METABOLIC PANEL
BUN/Creatinine Ratio: 21 (ref 12–28)
BUN: 32 mg/dL — ABNORMAL HIGH (ref 8–27)
CO2: 22 mmol/L (ref 20–29)
Calcium: 9.9 mg/dL (ref 8.7–10.3)
Chloride: 105 mmol/L (ref 96–106)
Creatinine, Ser: 1.56 mg/dL — ABNORMAL HIGH (ref 0.57–1.00)
GFR calc Af Amer: 36 mL/min/{1.73_m2} — ABNORMAL LOW (ref >59)
GFR calc non Af Amer: 31 mL/min/{1.73_m2} — ABNORMAL LOW (ref >59)
Glucose: 90 mg/dL (ref 65–99)
Potassium: 5.4 mmol/L — ABNORMAL HIGH (ref 3.5–5.2)
Sodium: 140 mmol/L (ref 134–144)

## 2019-03-13 LAB — VITAMIN D 25 HYDROXY (VIT D DEFICIENCY, FRACTURES): Vit D, 25-Hydroxy: 32.7 ng/mL (ref 30.0–100.0)

## 2019-03-13 LAB — CBC
Hematocrit: 35.4 % (ref 34.0–46.6)
Hemoglobin: 11.6 g/dL (ref 11.1–15.9)
MCH: 33 pg (ref 26.6–33.0)
MCHC: 32.8 g/dL (ref 31.5–35.7)
MCV: 101 fL — ABNORMAL HIGH (ref 79–97)
Platelets: 253 10*3/uL (ref 150–450)
RBC: 3.52 x10E6/uL — ABNORMAL LOW (ref 3.77–5.28)
RDW: 11.7 % (ref 11.7–15.4)
WBC: 6.4 10*3/uL (ref 3.4–10.8)

## 2019-03-13 LAB — TSH: TSH: 4.35 u[IU]/mL (ref 0.450–4.500)

## 2019-04-01 MED ORDER — HYDRALAZINE HCL 50 MG PO TABS: ORAL_TABLET | ORAL | 3 refills | Status: DC

## 2019-04-01 NOTE — Telephone Encounter (Signed)
Called patient. She has been taking hydralazine 50 mg in am, 25 in afternoon in evening. She is going to increase to 50 for afternoon dose as well. Her evening BPs have been high 160s-191/ She will try to be more consistent with the times she is taking this. Am dose by 9am, afternoon 2-3 pm and then last dose at bedtime (11). Will continue to monitor BP and write in or call with update.  She is aware I am forwarding to Dr. Harrington Challenger to inform and will call her back with new or different recommendations.

## 2019-04-06 ENCOUNTER — Telehealth: Payer: Self-pay | Admitting: Internal Medicine

## 2019-04-06 NOTE — Telephone Encounter (Signed)
Spoke to patient re BP readings  They have been very high  She spoke to [employer] earlier   REcomm to increase hydralazine to 50 bid I spoke with pt this AM   BP this am 150s/     Recomm Increase Hydralazine to 50 tid    I wll call later this PM

## 2019-04-07 ENCOUNTER — Telehealth: Payer: Self-pay | Admitting: Internal Medicine

## 2019-04-07 ENCOUNTER — Other Ambulatory Visit: Payer: Self-pay | Admitting: *Deleted

## 2019-04-07 NOTE — Telephone Encounter (Signed)
BP better yesterday   162/ last night    BP this AM 137/62  Currently:  Hydralazine 50 tid      Keep track of BPon this regimen

## 2019-05-14 ENCOUNTER — Other Ambulatory Visit: Payer: Self-pay | Admitting: *Deleted

## 2019-05-14 DIAGNOSIS — E039 Hypothyroidism, unspecified: Secondary | ICD-10-CM

## 2019-05-14 NOTE — Telephone Encounter (Signed)
Former Ronnald Ramp. NTBS LOV 03/25/18

## 2019-05-15 ENCOUNTER — Other Ambulatory Visit: Payer: Self-pay | Admitting: Internal Medicine

## 2019-05-17 DIAGNOSIS — E039 Hypothyroidism, unspecified: Secondary | ICD-10-CM

## 2019-05-17 MED ORDER — LEVOTHYROXINE SODIUM 50 MCG PO TABS: 50.0000 ug | ORAL_TABLET | Freq: Every day | ORAL | 3 refills | Status: DC

## 2019-05-17 NOTE — Telephone Encounter (Signed)
Patient aware and states she will call back to schedule with a new PCP.

## 2019-07-01 ENCOUNTER — Telehealth: Payer: Self-pay | Admitting: *Deleted

## 2019-07-01 NOTE — Telephone Encounter (Signed)
Agree with plan 

## 2019-07-01 NOTE — Telephone Encounter (Signed)
Called patient in response to her my chart request for phone call.  She tells me her BPs have been low and she was feeling really washed out and so she changed the hydralazine back to 1/2 tablet (25 mg) three times a day.  Around the end of May BPs: 81/47am, 111/50 afternoon, 105/54 bedtime  Since decreasing dose: 137/70 am 118/61 afternoon 144/60 bedtime  Adv pt to continue 1/2 tablet for morning and afternoon doses and whole tablet at bedtime.  Also adv to skip dose if SBP <100.  Pt to call or write in w BPs in a couple weeks.   Pt aware I will route to Dr. Harrington Challenger to make aware of BPs and changes and will call her with any other recommendations.

## 2019-08-09 ENCOUNTER — Other Ambulatory Visit: Payer: Self-pay | Admitting: Gastroenterology

## 2019-08-15 ENCOUNTER — Other Ambulatory Visit: Payer: Self-pay | Admitting: Internal Medicine

## 2019-08-24 ENCOUNTER — Other Ambulatory Visit: Payer: Self-pay | Admitting: Internal Medicine

## 2019-08-24 NOTE — Telephone Encounter (Signed)
Spoke to pt who stated that she is taking Xarelto every other day. Pt stated that Dr. Harrington Challenger is aware that she has been taking it every other day and that she has been taking it every other day for quite sometime and has not had any problems. See note (09/10/2018) for further documentation.   Spoke with pharm D, Xarelto was studied and is to be given once a day to keep pt protected from a blood clot/stroke. Instructed pt to take once a day.

## 2019-08-24 NOTE — Telephone Encounter (Signed)
Prescription refill request for Xarelto received.   Last office visit: Amy [name], 03/12/2019 Weight: 60.1 kg Age: 84 y.o. Scr: 1.56, 03/12/2019 CrCl: 26 ml/min

## 2019-08-25 ENCOUNTER — Other Ambulatory Visit: Payer: Self-pay | Admitting: Internal Medicine

## 2019-08-25 ENCOUNTER — Encounter: Payer: Self-pay | Admitting: *Deleted

## 2019-08-25 ENCOUNTER — Telehealth: Payer: Self-pay

## 2019-08-25 MED ORDER — LINZESS 145 MCG PO CAPS: 145.0000 ug | ORAL_CAPSULE | Freq: Every day | ORAL | 0 refills | Status: DC

## 2019-08-25 NOTE — Telephone Encounter (Signed)
error 

## 2019-08-25 NOTE — Telephone Encounter (Signed)
Rx sent to pharmacy as requested with no no refills.  Advised patient to follow up for future refills.

## 2019-08-26 ENCOUNTER — Other Ambulatory Visit: Payer: Self-pay | Admitting: Gastroenterology

## 2019-09-02 MED ORDER — LINACLOTIDE 72 MCG PO CAPS: 72.0000 ug | ORAL_CAPSULE | Freq: Every day | ORAL | 0 refills | Status: DC

## 2019-09-02 NOTE — Telephone Encounter (Signed)
Linzess 72 mcg sent to pharmacy as requested.  Per last office note in 2019 Dr Ardis Hughs decreased Linzess to 22mcg. Left message for patient to return call to schedule follow up appointment for future refills.

## 2019-09-02 NOTE — Addendum Note (Signed)
Addended by: Stevan Born on: 09/02/2019 01:35 PM   Modules accepted: Orders

## 2019-09-13 ENCOUNTER — Ambulatory Visit: Payer: Medicare PPO | Admitting: Gastroenterology

## 2019-09-13 ENCOUNTER — Encounter: Payer: Self-pay | Admitting: Gastroenterology

## 2019-09-13 VITALS — BP 176/64 | HR 61 | Ht 65.75 in | Wt 131.0 lb

## 2019-09-13 DIAGNOSIS — K5904 Chronic idiopathic constipation: Secondary | ICD-10-CM

## 2019-09-13 MED ORDER — LINACLOTIDE 72 MCG PO CAPS: 72.0000 ug | ORAL_CAPSULE | Freq: Every day | ORAL | 11 refills | Status: DC

## 2019-09-13 NOTE — Progress Notes (Signed)
Review of pertinent gastrointestinal problems: 1.  FH colon cancer (brother) also personal history of precancerous polyp: Colonoscopy May 2013, Dr. Ardis Hughs, found subcentimeter adenoma in sigmoid, diverticulosis. Colonoscopy May 2019 three subcentimeter adenomas removed, also diverticulosis. No recall given age 84. Chronic constipation, well controlled with linzess 86mcg one daily.   HPI: This is a very pleasant 84 year old woman who was last here about 2 years ago.  She has mild chronic constipation.  She has been taking Linzess 72 mcg pills.  She takes 1 pill 2 or 3 times a week and on that regimen she is quite happy and content with her bowels.  She has a bowel movement every day or every other day without much pushing or straining at all.  No significant abdominal pains and no overt bleeding.  Linzess 2-3 times per week  ROS: complete GI ROS as described in HPI, all other review negative.  Constitutional:  No unintentional weight loss   Past Medical History:  Diagnosis Date  . Anemia   . Arthritis   . Atrial fibrillation ([place])   . Atrial flutter ([place])    s/p ablation  . HTN (hypertension)   . Hypothyroidism   . Palpitations     Past Surgical History:  Procedure Laterality Date  . A FLUTTER ABLATION    . KNEE ARTHROSCOPY Right   . TUBAL LIGATION      Current Outpatient Medications  Medication Sig Dispense Refill  . acetaminophen (TYLENOL) 650 MG CR tablet Take 1,300 mg by mouth 2 (two) times daily as needed for pain.    Marland Kitchen AMBULATORY NON FORMULARY MEDICATION CBD Oil 1 dose under tongue once daily    . atenolol (TENORMIN) 25 MG tablet TAKE 1 TABLET BY MOUTH EVERY DAY 90 tablet 2  . Biotin 5000 MCG CAPS Take by mouth.    . Calcium Carbonate-Vitamin D (CALCIUM + D PO) 1 tab po qd     . Cholecalciferol 100 MCG (4000 UT) CAPS Take 1 capsule (4,000 Units total) by mouth daily. 30 capsule   . diclofenac sodium (VOLTAREN) 1 % GEL Apply 2 g topically 4 (four) times daily. To  affected joint. 300 g 11  . diltiazem (CARDIZEM CD) 240 MG 24 hr capsule TAKE 1 CAPSULE BY MOUTH EVERY DAY 90 capsule 3  . dorzolamide-timolol (COSOPT) 22.3-6.8 MG/ML ophthalmic solution Place 1 drop into both eyes 2 (two) times daily.    . flecainide (TAMBOCOR) 50 MG tablet TAKE 1 TABLET BY MOUTH TWICE A DAY 180 tablet 3  . hydrALAZINE (APRESOLINE) 50 MG tablet Take 1/2 to 1 tablet three times daily 270 tablet 3  . levothyroxine (SYNTHROID) 50 MCG tablet Take 1 tablet (50 mcg total) by mouth daily. 90 tablet 3  . linaclotide (LINZESS) 72 MCG capsule Take 1 capsule (72 mcg total) by mouth daily. 30 capsule 0  . losartan (COZAAR) 25 MG tablet TAKE 2 TABLETS BY MOUTH EVERY DAY 180 tablet 1  . XARELTO 15 MG TABS tablet TAKE 1 TABLET BY MOUTH EVERY DAY WITH SUPPER 30 tablet 1   No current facility-administered medications for this visit.    Allergies as of 09/13/2019  . (No Known Allergies)    Family History  Problem Relation Age of Onset  . Stroke Father 62       died from stroke at age 57  . Stroke Mother 86       died from stroke at age 32  . Heart disease Mother   . Colon cancer Brother  Social History   Socioeconomic History  . Marital status: Married    Spouse name: Not on file  . Number of children: 1  . Years of education: Not on file  . Highest education level: Not on file  Occupational History  . Occupation: Retired  Tobacco Use  . Smoking status: Never Smoker  . Smokeless tobacco: Never Used  Vaping Use  . Vaping Use: Never used  Substance and Sexual Activity  . Alcohol use: No  . Drug use: No  . Sexual activity: Not on file  Other Topics Concern  . Not on file  Social History Narrative   Occ caffeine    Social Determinants of Health   Financial Resource Strain:   . Difficulty of Paying Living Expenses: Not on file  Food Insecurity:   . Worried About Charity fundraiser in the Last Year: Not on file  . Ran Out of Food in the Last Year: Not on file   Transportation Needs:   . Lack of Transportation (Medical): Not on file  . Lack of Transportation (Non-Medical): Not on file  Physical Activity:   . Days of Exercise per Week: Not on file  . Minutes of Exercise per Session: Not on file  Stress:   . Feeling of Stress : Not on file  Social Connections:   . Frequency of Communication with Friends and Family: Not on file  . Frequency of Social Gatherings with Friends and Family: Not on file  . Attends Religious Services: Not on file  . Active Member of Clubs or Organizations: Not on file  . Attends Archivist Meetings: Not on file  . Marital Status: Not on file  Intimate Partner Violence:   . Fear of Current or Ex-Partner: Not on file  . Emotionally Abused: Not on file  . Physically Abused: Not on file  . Sexually Abused: Not on file     Physical Exam: BP (!) 176/64   Pulse 61   Ht 5' 5.75" (1.67 m)   Wt 131 lb (59.4 kg)   LMP  (LMP Unknown)   SpO2 98%   BMI 21.31 kg/m  Constitutional: generally well-appearing Psychiatric: alert and oriented x3 Abdomen: soft, nontender, nondistended, no obvious ascites, no peritoneal signs, normal bowel sounds No peripheral edema noted in lower extremities  Assessment and plan: 84 y.o. female with mild chronic constipation  I am happy to refill her Linzess prescription today.  I will refill it again in 1 year from now.  If she still needs the prescription medicine in 2 years I would like to see her in the office to see how she is doing before prescribing it again.  Please see the "Patient Instructions" section for addition details about the plan.  Owens Loffler, MD [facility] 09/13/2019, 2:11 PM   Total time on date of encounter was 20 minutes (this included time spent preparing to see the patient reviewing records; obtaining and/or reviewing separately obtained history; performing a medically appropriate exam and/or evaluation; counseling and educating the  patient and family if present; ordering medications, tests or procedures if applicable; and documenting clinical information in the health record).

## 2019-09-13 NOTE — Patient Instructions (Addendum)
If you are age 82 or older, your body mass index should be between 23-30. Your Body mass index is 21.31 kg/m. If this is out of the aforementioned range listed, please consider follow up with your Primary Care Provider.  If you are age 29 or younger, your body mass index should be between 19-25. Your Body mass index is 21.31 kg/m. If this is out of the aformentioned range listed, please consider follow up with your Primary Care Provider.   You will follow up with our office on an as needed basis.  Thank you for entrusting me with your care and choosing [facility].  Dr Ardis Hughs

## 2019-09-17 ENCOUNTER — Other Ambulatory Visit: Payer: Self-pay | Admitting: Gastroenterology

## 2019-10-01 ENCOUNTER — Other Ambulatory Visit: Payer: Self-pay

## 2019-10-01 ENCOUNTER — Ambulatory Visit: Payer: Medicare PPO | Admitting: Physician Assistant

## 2019-10-01 ENCOUNTER — Encounter: Payer: Self-pay | Admitting: Physician Assistant

## 2019-10-01 DIAGNOSIS — D1800 Hemangioma unspecified site: Secondary | ICD-10-CM

## 2019-10-01 DIAGNOSIS — L820 Inflamed seborrheic keratosis: Secondary | ICD-10-CM

## 2019-10-01 DIAGNOSIS — Z1283 Encounter for screening for malignant neoplasm of skin: Secondary | ICD-10-CM | POA: Diagnosis not present

## 2019-10-01 DIAGNOSIS — L814 Other melanin hyperpigmentation: Secondary | ICD-10-CM

## 2019-10-01 DIAGNOSIS — D229 Melanocytic nevi, unspecified: Secondary | ICD-10-CM | POA: Diagnosis not present

## 2019-10-01 DIAGNOSIS — Z86018 Personal history of other benign neoplasm: Secondary | ICD-10-CM | POA: Diagnosis not present

## 2019-10-01 DIAGNOSIS — L578 Other skin changes due to chronic exposure to nonionizing radiation: Secondary | ICD-10-CM

## 2019-10-01 DIAGNOSIS — L821 Other seborrheic keratosis: Secondary | ICD-10-CM

## 2019-10-01 NOTE — Progress Notes (Signed)
   Follow-Up Visit   Subjective  Amy [name] is a 84 y.o. female who presents for the following: Annual Exam (Concerns right arm. Sore spot. ).   The following portions of the chart were reviewed this encounter and updated as appropriate: Tobacco  Allergies  Meds  Problems  Med Hx  Surg Hx  Fam Hx      Objective  Well appearing patient in no apparent distress; mood and affect are within normal limits.  All skin waist up examined.  Objective  Head - to toe: No atypical nevi No signs of non-mole skin cancer.   Objective  Left Lower Leg - Anterior: Scar clear  Objective  Right Upper Arm - Anterior: Erythematous stuck-on, waxy papule or plaque.   Assessment & Plan  Screening exam for skin cancer Head - to toe  Yearly skin examinations  History of dysplastic nevus Left Lower Leg - Anterior  observe  Inflamed seborrheic keratosis Right Upper Arm - Anterior  Destruction of lesion - Right Upper Arm - Anterior Complexity: simple   Destruction method: cryotherapy   Informed consent: discussed and consent obtained   Timeout:  patient name, date of birth, surgical site, and procedure verified Lesion destroyed using liquid nitrogen: Yes   Outcome: patient tolerated procedure well with no complications    Lentigines - Scattered tan macules - Discussed due to sun exposure - Benign, observe - Call for any changes  Seborrheic Keratoses - Stuck-on, waxy, tan-brown papules and plaques  - Discussed benign etiology and prognosis. - Observe - Call for any changes  Melanocytic Nevi - Tan-brown and/or pink-flesh-colored symmetric macules and papules - Benign appearing on exam today - Observation - Call clinic for new or changing moles - Recommend daily use of broad spectrum spf 30+ sunscreen to sun-exposed areas.   Hemangiomas - Red papules - Discussed benign nature - Observe - Call for any changes  Actinic Damage - diffuse scaly erythematous macules with  underlying dyspigmentation - Recommend daily broad spectrum sunscreen SPF 30+ to sun-exposed areas, reapply every 2 hours as needed.  - Call for new or changing lesions.    I, Barre Aydelott, PA-C, have reviewed all documentation's for this visit.  The documentation on 10/01/19 for the exam, diagnosis, procedures and orders are all accurate and complete.

## 2019-10-03 NOTE — Progress Notes (Signed)
Cardiology Office Note   Date:  10/04/2019   ID:  Amy [name], DOB 1935-07-01, MRN [id number]  PCP:  Binnie Rail, MD  Cardiologist:   Dorris Carnes, MD    F/U of HTN and PAF     History of Present Illness: Amy [name] is a 84 y.o. female with a history of atrial flutter (s/p ablation), PAF and HTN   I saw the pt in clinic in Feb 2021    Since seen the pt denies palpitations  Breathing is OK  BP is up and down   Taking 0.5/0.5/1 hydralazine per day  Along with other meds    Outpatient Medications Prior to Visit  Medication Sig Dispense Refill  . acetaminophen (TYLENOL) 650 MG CR tablet Take 1,300 mg by mouth 2 (two) times daily as needed for pain.    Marland Kitchen AMBULATORY NON FORMULARY MEDICATION CBD Oil 1 dose under tongue once daily    . atenolol (TENORMIN) 25 MG tablet TAKE 1 TABLET BY MOUTH EVERY DAY 90 tablet 2  . Biotin 5000 MCG CAPS Take by mouth.    . Calcium Carbonate-Vitamin D (CALCIUM + D PO) 1 tab po qd     . Cholecalciferol 100 MCG (4000 UT) CAPS Take 1 capsule (4,000 Units total) by mouth daily. 30 capsule   . diclofenac sodium (VOLTAREN) 1 % GEL Apply 2 g topically 4 (four) times daily. To affected joint. 300 g 11  . diltiazem (CARDIZEM CD) 240 MG 24 hr capsule TAKE 1 CAPSULE BY MOUTH EVERY DAY 90 capsule 3  . dorzolamide-timolol (COSOPT) 22.3-6.8 MG/ML ophthalmic solution Place 1 drop into both eyes 2 (two) times daily.    . flecainide (TAMBOCOR) 50 MG tablet TAKE 1 TABLET BY MOUTH TWICE A DAY 180 tablet 3  . hydrALAZINE (APRESOLINE) 50 MG tablet Take 1/2 to 1 tablet three times daily 270 tablet 3  . levothyroxine (SYNTHROID) 50 MCG tablet Take 1 tablet (50 mcg total) by mouth daily. 90 tablet 3  . linaclotide (LINZESS) 72 MCG capsule Take 1 capsule (72 mcg total) by mouth daily. 30 capsule 11  . losartan (COZAAR) 25 MG tablet TAKE 2 TABLETS BY MOUTH EVERY DAY 180 tablet 1  . XARELTO 15 MG TABS tablet TAKE 1 TABLET BY MOUTH EVERY DAY WITH SUPPER 30 tablet 1   No  facility-administered medications prior to visit.     Allergies:   Patient has no known allergies.   Past Medical History:  Diagnosis Date  . Anemia   . Arthritis   . Atrial fibrillation ([place])   . Atrial flutter ([place])    s/p ablation  . HTN (hypertension)   . Hypothyroidism   . Palpitations     Past Surgical History:  Procedure Laterality Date  . A FLUTTER ABLATION    . KNEE ARTHROSCOPY Right   . TUBAL LIGATION       Social History:  The patient  reports that she has never smoked. She has never used smokeless tobacco. She reports that she does not drink alcohol and does not use drugs.   Family History:  The patient's family history includes Colon cancer in her brother; Heart disease in her mother; Stroke (age of onset: 33) in her mother; Stroke (age of onset: 38) in her father.    ROS:  Please see the history of present illness. All other systems are reviewed and  Negative to the above problem except as noted.    PHYSICAL EXAM: VS:  BP Marland Kitchen)  158/66   Pulse (!) 58   Ht 5' 5.75" (1.67 m)   Wt 130 lb 9.6 oz (59.2 kg)   LMP  (LMP Unknown)   BMI 21.24 kg/m      GEN: Pt is in NAD  HEENT: normal  Neck: JVP is normal   No  carotid bruits  Cardiac: RRR; no murmurs,,no LE edema  Respiratory:  clear to auscultation bilaterally,  GII: soft, nontender, nondistended, + BS  No hepatomegaly  MS: no deformity Moving all extremities   Skin: warm and dry, no rash Psych: euthymic mood, full affect   EKG:  EKG is ordered today   SB 58 bpm  First degree AV block  PR 200 msec   RBBB   Unchanged   Lipid Panel    Component Value Date/Time   CHOL 184 09/10/2018 1136   TRIG 81 09/10/2018 1136   HDL 101 09/10/2018 1136   CHOLHDL 1.8 09/10/2018 1136   CHOLHDL 2.0 06/05/2015 0946   VLDL 10 06/05/2015 0946   LDLCALC 67 09/10/2018 1136   LDLDIRECT 84.9 11/15/2008 0924      Wt Readings from Last 3 Encounters:  10/04/19 130 lb 9.6 oz (59.2 kg)  09/13/19 131 lb (59.4 kg)  03/12/19  132 lb 6.4 oz (60.1 kg)      ASSESSMENT AND PLAN:  1  HTN  BP is labile per her report   HIgh today    I would recomm increasing hydralazine back to 50-25-25   Follow BP  Call with response    2  PAF Denies palpitations    Continue current regimen   Will check labs today    Tentaive f/u in 6 months       Current medicines are reviewed at length with the patient today.  The patient does not have concerns regarding medicines.  Signed, Dorris Carnes, MD  10/04/2019 10:20 PM    [facility] [place], [place],   [address] Phone: [phone]; Fax: [phone]

## 2019-10-04 ENCOUNTER — Telehealth: Payer: Self-pay | Admitting: General Practice

## 2019-10-04 ENCOUNTER — Ambulatory Visit: Payer: Medicare PPO | Admitting: Internal Medicine

## 2019-10-04 ENCOUNTER — Encounter: Payer: Self-pay | Admitting: Internal Medicine

## 2019-10-04 ENCOUNTER — Other Ambulatory Visit: Payer: Self-pay

## 2019-10-04 VITALS — BP 158/66 | HR 58 | Ht 65.75 in | Wt 130.6 lb

## 2019-10-04 DIAGNOSIS — I10 Essential (primary) hypertension: Secondary | ICD-10-CM

## 2019-10-04 NOTE — Telephone Encounter (Signed)
yes

## 2019-10-04 NOTE — Telephone Encounter (Signed)
Patient's husband is a patient of yours and he said that Dr. Quay Burow would be willing to accept his wife as a  patient. Please advice.

## 2019-10-04 NOTE — Patient Instructions (Signed)
Medication Instructions:  Take your hydralazine 3 times a day - whole tab, half tab, half tab  *If you need a refill on your cardiac medications before your next appointment, please call your pharmacy*   Lab Work: Today: bmet, cbc If you have labs (blood work) drawn today and your tests are completely normal, you will receive your results only by: Marland Kitchen MyChart Message (if you have MyChart) OR . A paper copy in the mail If you have any lab test that is abnormal or we need to change your treatment, we will call you to review the results.   Testing/Procedures: none   Follow-Up: At [facility], you and your health needs are our priority.  As part of our continuing mission to provide you with exceptional heart care, we have created designated Provider Care Teams.  These Care Teams include your primary Cardiologist (physician) and Advanced Practice Providers (APPs -  Physician Assistants and Nurse Practitioners) who all work together to provide you with the care you need, when you need it.  Your next appointment:   6 month(s)  The format for your next appointment:   In Person  Provider:   You may see Dorris Carnes, MD or one of the following Advanced Practice Providers on your designated Care Team:    Richardson Dopp, PA-C  Robbie Lis, [place]    Other Instructions

## 2019-10-05 LAB — BASIC METABOLIC PANEL
BUN/Creatinine Ratio: 15 (ref 12–28)
BUN: 22 mg/dL (ref 8–27)
CO2: 25 mmol/L (ref 20–29)
Calcium: 9.4 mg/dL (ref 8.7–10.3)
Chloride: 104 mmol/L (ref 96–106)
Creatinine, Ser: 1.43 mg/dL — ABNORMAL HIGH (ref 0.57–1.00)
GFR calc Af Amer: 39 mL/min/{1.73_m2} — ABNORMAL LOW (ref >59)
GFR calc non Af Amer: 34 mL/min/{1.73_m2} — ABNORMAL LOW (ref >59)
Glucose: 107 mg/dL — ABNORMAL HIGH (ref 65–99)
Potassium: 4.9 mmol/L (ref 3.5–5.2)
Sodium: 140 mmol/L (ref 134–144)

## 2019-10-05 LAB — CBC
Hematocrit: 33.3 % — ABNORMAL LOW (ref 34.0–46.6)
Hemoglobin: 11.3 g/dL (ref 11.1–15.9)
MCH: 33.8 pg — ABNORMAL HIGH (ref 26.6–33.0)
MCHC: 33.9 g/dL (ref 31.5–35.7)
MCV: 100 fL — ABNORMAL HIGH (ref 79–97)
Platelets: 233 10*3/uL (ref 150–450)
RBC: 3.34 x10E6/uL — ABNORMAL LOW (ref 3.77–5.28)
RDW: 11.7 % (ref 11.7–15.4)
WBC: 6.3 10*3/uL (ref 3.4–10.8)

## 2019-10-14 ENCOUNTER — Other Ambulatory Visit: Payer: Self-pay | Admitting: Internal Medicine

## 2019-11-17 NOTE — Telephone Encounter (Signed)
Patient made a new pt appointment for 11/23/2019.

## 2019-11-18 ENCOUNTER — Other Ambulatory Visit: Payer: Self-pay | Admitting: Internal Medicine

## 2019-11-18 NOTE — Telephone Encounter (Signed)
Prescription refill request for Xarelto received.  Last office visit: 10/04/2019, Ross Scr:  1.43, 10/04/2019 Age: 84 yo Weight: 59.2 kg CrCl: 28 ml/min   Prescription refill sent.

## 2019-11-20 ENCOUNTER — Ambulatory Visit: Payer: Medicare PPO | Attending: Internal Medicine

## 2019-11-20 DIAGNOSIS — Z23 Encounter for immunization: Secondary | ICD-10-CM

## 2019-11-20 NOTE — Progress Notes (Signed)
   Covid-19 Vaccination Clinic  Name:  KIANNA BILLET    MRN: [id number] DOB: 1935-05-04  11/20/2019  Ms. Hirsch was observed post Covid-19 immunization for 15 minutes without incident. She was provided with Vaccine Information Sheet and instruction to access the V-Safe system.   Ms. Starrett was instructed to call 911 with any severe reactions post vaccine: Marland Kitchen Difficulty breathing  . Swelling of face and throat  . A fast heartbeat  . A bad rash all over body  . Dizziness and weakness

## 2019-11-22 ENCOUNTER — Encounter: Payer: Self-pay | Admitting: Internal Medicine

## 2019-11-22 DIAGNOSIS — N183 Chronic kidney disease, stage 3 unspecified: Secondary | ICD-10-CM | POA: Insufficient documentation

## 2019-11-22 DIAGNOSIS — R739 Hyperglycemia, unspecified: Secondary | ICD-10-CM | POA: Insufficient documentation

## 2019-11-22 DIAGNOSIS — N1832 Chronic kidney disease, stage 3b: Secondary | ICD-10-CM | POA: Insufficient documentation

## 2019-11-22 NOTE — Progress Notes (Signed)
Subjective:    Patient ID: Amy [name], female    DOB: [birth year], 84 y.o.   MRN: [id number]  HPI  She is here to establish with a new pcp.  The patient is here for follow up of their chronic medical problems, including Afib, htn, hypothyroidism, chronic constipation, CKD, hyperglycemia  BP at home in 140's  She does have osteoporosis.  Was on fosmax and it did not help.  Did not want to do prolia.  Taking calcium and vitmain .   Active, no regular exericse.     Medications and allergies reviewed with patient and updated if appropriate.  Patient Active Problem List   Diagnosis Date Noted  . Osteoporosis 11/23/2019  . Low vitamin B12 level 11/23/2019  . CKD (chronic kidney disease) stage 3, GFR 30-59 ml/min (HCC) 11/22/2019  . Hyperglycemia 11/22/2019  . Degenerative arthritis of knee, bilateral 04/04/2017  . Chronic idiopathic constipation 02/18/2017  . Osteoarthritis 12/03/2016  . Tinnitus of both ears 12/03/2016  . Hypothyroidism 01/28/2016  . Primary osteoarthritis involving multiple joints 01/28/2016  . Overactive bladder 01/26/2016  . Premature atrial contractions 07/03/2010  . Atrial fibrillation ([place]) 06/05/2010  . Essential hypertension 09/03/2008  . ARTHROSCOPY, KNEE, HX OF 09/03/2008    Current Outpatient Medications on File Prior to Visit  Medication Sig Dispense Refill  . acetaminophen (TYLENOL) 650 MG CR tablet Take 1,300 mg by mouth 2 (two) times daily as needed for pain.    Marland Kitchen AMBULATORY NON FORMULARY MEDICATION CBD Oil 1 dose under tongue once daily    . atenolol (TENORMIN) 25 MG tablet TAKE 1 TABLET BY MOUTH EVERY DAY 90 tablet 2  . Biotin 5000 MCG CAPS Take by mouth.    . Calcium Carbonate-Vitamin D (CALCIUM + D PO) 1 tab po qd     . Cholecalciferol 100 MCG (4000 UT) CAPS Take 1 capsule (4,000 Units total) by mouth daily. 30 capsule   . diclofenac sodium (VOLTAREN) 1 % GEL Apply 2 g topically 4 (four) times daily. To affected joint. 300 g 11  .  diltiazem (CARDIZEM CD) 240 MG 24 hr capsule TAKE 1 CAPSULE BY MOUTH EVERY DAY 90 capsule 3  . dorzolamide-timolol (COSOPT) 22.3-6.8 MG/ML ophthalmic solution Place 1 drop into both eyes 2 (two) times daily.    . flecainide (TAMBOCOR) 50 MG tablet TAKE 1 TABLET BY MOUTH TWICE A DAY 180 tablet 3  . hydrALAZINE (APRESOLINE) 50 MG tablet Take 1/2 to 1 tablet three times daily 270 tablet 3  . levothyroxine (SYNTHROID) 50 MCG tablet Take 1 tablet (50 mcg total) by mouth daily. 90 tablet 3  . linaclotide (LINZESS) 72 MCG capsule Take 1 capsule (72 mcg total) by mouth daily. 30 capsule 11  . losartan (COZAAR) 25 MG tablet TAKE 2 TABLETS BY MOUTH EVERY DAY 180 tablet 1  . XARELTO 15 MG TABS tablet TAKE 1 TABLET BY MOUTH EVERY DAY WITH SUPPER 30 tablet 5   No current facility-administered medications on file prior to visit.    Past Medical History:  Diagnosis Date  . Anemia   . Arthritis   . Atrial fibrillation ([place])   . Atrial flutter ([place])    s/p ablation  . HTN (hypertension)   . Hypothyroidism   . Palpitations     Past Surgical History:  Procedure Laterality Date  . A FLUTTER ABLATION    . KNEE ARTHROSCOPY Right   . TUBAL LIGATION      Social History   Socioeconomic History  .  Marital status: Married    Spouse name: Not on file  . Number of children: 1  . Years of education: Not on file  . Highest education level: Not on file  Occupational History  . Occupation: Retired  Tobacco Use  . Smoking status: Never Smoker  . Smokeless tobacco: Never Used  Vaping Use  . Vaping Use: Never used  Substance and Sexual Activity  . Alcohol use: No  . Drug use: No  . Sexual activity: Not on file  Other Topics Concern  . Not on file  Social History Narrative   Occ caffeine    Social Determinants of Health   Financial Resource Strain:   . Difficulty of Paying Living Expenses: Not on file  Food Insecurity:   . Worried About Charity fundraiser in the Last Year: Not on file  . Ran  Out of Food in the Last Year: Not on file  Transportation Needs:   . Lack of Transportation (Medical): Not on file  . Lack of Transportation (Non-Medical): Not on file  Physical Activity:   . Days of Exercise per Week: Not on file  . Minutes of Exercise per Session: Not on file  Stress:   . Feeling of Stress : Not on file  Social Connections:   . Frequency of Communication with Friends and Family: Not on file  . Frequency of Social Gatherings with Friends and Family: Not on file  . Attends Religious Services: Not on file  . Active Member of Clubs or Organizations: Not on file  . Attends Archivist Meetings: Not on file  . Marital Status: Not on file    Family History  Problem Relation Age of Onset  . Stroke Father 26       died from stroke at age 28  . Stroke Mother 37       died from stroke at age 67  . Heart disease Mother   . Colon cancer Brother     Review of Systems  Constitutional: Negative for chills and fever.  Eyes: Negative for visual disturbance.  Respiratory: Negative for cough, shortness of breath and wheezing.   Cardiovascular: Negative for chest pain, palpitations and leg swelling.  Gastrointestinal: Positive for constipation. Negative for abdominal pain, blood in stool and nausea.       Occ GERD  Genitourinary: Positive for frequency (twice at nght). Negative for dysuria.  Musculoskeletal: Positive for arthralgias.  Neurological: Negative for light-headedness and headaches.  Psychiatric/Behavioral: Negative for dysphoric mood. The patient is not nervous/anxious.        Objective:   Vitals:   11/23/19 1017  BP: (!) 144/78  Pulse: 64  Temp: 98 F (36.7 C)  SpO2: 97%   BP Readings from Last 3 Encounters:  11/23/19 (!) 144/78  10/04/19 (!) 158/66  09/13/19 (!) 176/64   Wt Readings from Last 3 Encounters:  11/23/19 131 lb (59.4 kg)  10/04/19 130 lb 9.6 oz (59.2 kg)  09/13/19 131 lb (59.4 kg)   Body mass index is 21.31 kg/m.    Physical Exam    Constitutional: Appears well-developed and well-nourished. No distress.  HENT:  Head: Normocephalic and atraumatic. Ears: Left external ear, ear canal and TM normal.  Right external ear normal.  Ear canal normal except mild amount of wax partially obstructing canal-wax is deep in canal and does not appear to be excessive.  Unable to visualize TM Neck: Neck supple. No tracheal deviation present. No thyromegaly present.  No cervical lymphadenopathy  Cardiovascular: Normal rate, regular rhythm and normal heart sounds.   No murmur heard. No carotid bruit .  No edema Pulmonary/Chest: Effort normal and breath sounds normal. No respiratory distress. No has no wheezes. No rales.  Abdomen: soft, NT, ND Skin: Skin is warm and dry. Not diaphoretic.  Psychiatric: Normal mood and affect. Behavior is normal.      Assessment & Plan:    See Problem List for Assessment and Plan of chronic medical problems.    This visit occurred during the SARS-CoV-2 public health emergency.  Safety protocols were in place, including screening questions prior to the visit, additional usage of staff PPE, and extensive cleaning of exam room while observing appropriate contact time as indicated for disinfecting solutions.

## 2019-11-22 NOTE — Patient Instructions (Addendum)
    Medications reviewed and updated.  Changes include :   none       Please followup in 1 year for a physical

## 2019-11-23 ENCOUNTER — Ambulatory Visit (INDEPENDENT_AMBULATORY_CARE_PROVIDER_SITE_OTHER): Payer: Medicare PPO | Admitting: Internal Medicine

## 2019-11-23 ENCOUNTER — Other Ambulatory Visit: Payer: Self-pay

## 2019-11-23 VITALS — BP 144/78 | HR 64 | Temp 98.0°F | Ht 65.75 in | Wt 131.0 lb

## 2019-11-23 DIAGNOSIS — R739 Hyperglycemia, unspecified: Secondary | ICD-10-CM

## 2019-11-23 DIAGNOSIS — E039 Hypothyroidism, unspecified: Secondary | ICD-10-CM

## 2019-11-23 DIAGNOSIS — M810 Age-related osteoporosis without current pathological fracture: Secondary | ICD-10-CM

## 2019-11-23 DIAGNOSIS — I10 Essential (primary) hypertension: Secondary | ICD-10-CM | POA: Diagnosis not present

## 2019-11-23 DIAGNOSIS — M159 Polyosteoarthritis, unspecified: Secondary | ICD-10-CM

## 2019-11-23 DIAGNOSIS — K5904 Chronic idiopathic constipation: Secondary | ICD-10-CM

## 2019-11-23 DIAGNOSIS — M8949 Other hypertrophic osteoarthropathy, multiple sites: Secondary | ICD-10-CM

## 2019-11-23 DIAGNOSIS — E538 Deficiency of other specified B group vitamins: Secondary | ICD-10-CM | POA: Insufficient documentation

## 2019-11-23 DIAGNOSIS — I4891 Unspecified atrial fibrillation: Secondary | ICD-10-CM

## 2019-11-23 DIAGNOSIS — N1832 Chronic kidney disease, stage 3b: Secondary | ICD-10-CM

## 2019-11-23 DIAGNOSIS — R7989 Other specified abnormal findings of blood chemistry: Secondary | ICD-10-CM | POA: Insufficient documentation

## 2019-11-23 NOTE — Assessment & Plan Note (Signed)
Chronic Appears to be euvolemic Cardiology has been managing her thyroid at this time, which I can take over New current dose of levothyroxine 50 mcg daily

## 2019-11-23 NOTE — Assessment & Plan Note (Signed)
Chronic Osteoarthritis of multiple joints Takes Tylenol, uses CBD oil uses topical arthritis medications Encourage more regular exercise

## 2019-11-23 NOTE — Assessment & Plan Note (Signed)
Chronic Encouraged increasing her fluids We will need to monitor her kidney function

## 2019-11-23 NOTE — Assessment & Plan Note (Addendum)
Chronic Takes linzess 72 mcg but not daily Overall controlled-she will continue to take medication as needed

## 2019-11-23 NOTE — Assessment & Plan Note (Signed)
Neck Rate controlled, asymptomatic Has been in sinus rhythm Following with cardiology Currently on atenolol 25 mg daily, diltiazem to 40 mg daily, flecainide 50 mg twice daily and Xarelto 15 mg daily

## 2019-11-23 NOTE — Assessment & Plan Note (Signed)
Chronic Mildly elevated Encourage regular exercise We will check A1c when I do blood work next

## 2019-11-23 NOTE — Assessment & Plan Note (Signed)
Chronic DEXA up-to-date We will recheck DEXA next year Has tried Fosamax-not effective She does not want to try Prolia-very concerned about the side effects Encourage more regular exercise Continue calcium and vitamin D daily

## 2019-11-23 NOTE — Assessment & Plan Note (Signed)
Chronic BP well controlled Continue atenolol 25 mg daily, diltiazem to 40 mg daily, flecainide 50 mg twice daily and losartan 25 mg twice daily, hydralazine 25-50 mg 3 times a day

## 2019-11-24 ENCOUNTER — Ambulatory Visit: Payer: Medicare PPO

## 2020-02-22 ENCOUNTER — Other Ambulatory Visit: Payer: Self-pay | Admitting: Internal Medicine

## 2020-02-29 ENCOUNTER — Other Ambulatory Visit: Payer: Self-pay | Admitting: Internal Medicine

## 2020-03-29 ENCOUNTER — Ambulatory Visit: Payer: Medicare PPO | Admitting: Physician Assistant

## 2020-03-29 ENCOUNTER — Other Ambulatory Visit: Payer: Self-pay

## 2020-03-29 ENCOUNTER — Encounter: Payer: Self-pay | Admitting: Physician Assistant

## 2020-03-29 DIAGNOSIS — Z1283 Encounter for screening for malignant neoplasm of skin: Secondary | ICD-10-CM

## 2020-03-29 DIAGNOSIS — C44319 Basal cell carcinoma of skin of other parts of face: Secondary | ICD-10-CM | POA: Diagnosis not present

## 2020-03-29 DIAGNOSIS — Z86018 Personal history of other benign neoplasm: Secondary | ICD-10-CM

## 2020-03-29 DIAGNOSIS — D485 Neoplasm of uncertain behavior of skin: Secondary | ICD-10-CM

## 2020-03-29 DIAGNOSIS — Z87898 Personal history of other specified conditions: Secondary | ICD-10-CM

## 2020-03-29 DIAGNOSIS — C4491 Basal cell carcinoma of skin, unspecified: Secondary | ICD-10-CM

## 2020-03-29 HISTORY — DX: Basal cell carcinoma of skin, unspecified: C44.91

## 2020-03-29 NOTE — Patient Instructions (Addendum)
Mohs Surgery Mohs surgery is a procedure used to treat skin cancer. It is often used to treat common types of skin cancer, such as basal cell carcinoma and squamous cell carcinoma. In this procedure, cancerous skin cells are carefully cut away layer by layer. The goal is to remove all cancerous tissue and leave healthy skin. This reduces scarring and allows for a better cosmetic outcome. Mohs surgery is used to treat skin cancer in areas where it is important to save as much of the normal skin as possible. These areas include the face, nose, ears, lips, and genitals. This procedure may be done if:  Your skin cancer has returned after another type of treatment was done.  The cancer is likely to return.  The cancerous area is large.  The cancerous area has edges that are not clearly defined.  The cancer is growing rapidly. Tell a health care provider about:  Any allergies you have.  All medicines you are taking, including vitamins, herbs, eye drops, creams, and over-the-counter medicines.  Any problems you or family members have had with anesthetic medicines.  Any blood disorders you have.  Any surgeries you have had.  Any medical conditions you have.  Whether you are pregnant or may be pregnant. What are the risks? Generally, this is a safe procedure. However, problems may occur, including:  Infection.  Bleeding.  Allergic reactions to medicines.  Damage to other structures, such as nerves. What happens before the procedure?  Ask your health care provider about: ? Changing or stopping your regular medicines. This is especially important if you are taking diabetes medicines or blood thinners. ? Taking medicines such as aspirin and ibuprofen. These medicines can thin your blood. Do not take these medicines unless your health care provider tells you to take them. ? Taking over-the-counter medicines, vitamins, herbs, and supplements.  Ask your health care provider how  your surgical site will be marked or identified.  Ask your health care provider what steps will be taken to help prevent infection. These may include: ? Removing hair at the surgery site. ? Washing skin with a germ-killing soap. ? Taking antibiotic medicine. What happens during the procedure?  You will be given a medicine to numb the area (local anesthetic).  A layer of cancerous tissue will be removed with a scalpel. The layer removed will contain a small amount of the healthy tissue surrounding the cancerous tissue.  The layer of removed tissue will be checked right away under a microscope. The surgeon will note the exact location of the cancerous cells.  Another layer of tissue may be removed from an area with any remaining cancerous cells. This layer will be checked in the same way.  More layers of cancerous tissue may be removed, one by one, and checked until no signs of cancer remain.  Depending on the size and location of the surgical wound, it may be closed with stitches (sutures) or left open to heal on its own. In some cases, a skin flap or skin graft may be needed.  A bandage (dressing) will be applied to the area. The procedure may vary among health care providers and hospitals.   What happens after the procedure?  Return to your normal activities as told by your health care provider. Summary  Mohs surgery is a procedure used to treat skin cancer on the face, ears, nose, lips, and genitals. It removes the cancerous cells while leaving as much healthy skin as possible.  Generally,  this is a safe procedure. However, problems may occur, including infection, bleeding, and damage to other structures, such as nerves.  Follow your health care provider's instructions before the procedure. You may be asked to change or stop certain medicines.  After the procedure, you may return to your normal activities as told by your health care provider. This information is not intended to  replace advice given to you by your health care provider. Make sure you discuss any questions you have with your health care provider. Document Revised: 08/05/2017 Document Reviewed: 08/05/2017 Elsevier Patient Education  [place]. Biopsy, Surgery (Curettage) & Surgery (Excision) Aftercare Instructions  1. Okay to remove bandage in 24 hours  2. Wash area with soap and water  3. Apply Vaseline to area twice daily until healed (Not Neosporin)  4. Okay to cover with a Band-Aid to decrease the chance of infection or prevent irritation from clothing; also it's okay to uncover lesion at home.  5. Suture instructions: return to our office in 7-10 or 10-14 days for a nurse visit for suture removal. Variable healing with sutures, if pain or itching occurs call our office. It's okay to shower or bathe 24 hours after sutures are given.  6. The following risks may occur after a biopsy, curettage or excision: bleeding, scarring, discoloration, recurrence, infection (redness, yellow drainage, pain or swelling).  7. For questions, concerns and results call our office at [place] before 4pm & Friday before 3pm. Biopsy results will be available in 1 week.

## 2020-03-31 ENCOUNTER — Ambulatory Visit: Payer: Medicare PPO | Admitting: Physician Assistant

## 2020-04-06 ENCOUNTER — Telehealth: Payer: Self-pay

## 2020-04-06 NOTE — Telephone Encounter (Signed)
-----   Message from Warren Danes, [place] sent at 04/05/2020  4:32 PM EDT ----- Mohs... check status of her face please

## 2020-04-06 NOTE — Telephone Encounter (Signed)
Phone call to patient with her pathology results.  Patient aware of results.  Per patient her face is doing good.

## 2020-04-11 ENCOUNTER — Encounter: Payer: Self-pay | Admitting: Internal Medicine

## 2020-04-11 ENCOUNTER — Ambulatory Visit: Payer: Medicare PPO | Admitting: Physician Assistant

## 2020-04-11 ENCOUNTER — Other Ambulatory Visit: Payer: Self-pay

## 2020-04-11 ENCOUNTER — Encounter: Payer: Self-pay | Admitting: Physician Assistant

## 2020-04-11 ENCOUNTER — Ambulatory Visit: Payer: Medicare PPO | Admitting: Internal Medicine

## 2020-04-11 VITALS — BP 146/60 | HR 68 | Ht 65.75 in | Wt 135.0 lb

## 2020-04-11 DIAGNOSIS — I10 Essential (primary) hypertension: Secondary | ICD-10-CM | POA: Diagnosis not present

## 2020-04-11 DIAGNOSIS — D485 Neoplasm of uncertain behavior of skin: Secondary | ICD-10-CM | POA: Diagnosis not present

## 2020-04-11 DIAGNOSIS — L820 Inflamed seborrheic keratosis: Secondary | ICD-10-CM

## 2020-04-11 DIAGNOSIS — E039 Hypothyroidism, unspecified: Secondary | ICD-10-CM | POA: Diagnosis not present

## 2020-04-11 DIAGNOSIS — Z79899 Other long term (current) drug therapy: Secondary | ICD-10-CM | POA: Diagnosis not present

## 2020-04-11 DIAGNOSIS — I480 Paroxysmal atrial fibrillation: Secondary | ICD-10-CM

## 2020-04-11 NOTE — Patient Instructions (Signed)

## 2020-04-11 NOTE — Progress Notes (Signed)
   Follow-Up Visit   Subjective  Amy [name] is a 85 y.o. female who presents for the following: Skin Problem (Right elbow wart? Left mid arm wart?).  Patient was concerned about the Mohs surgery that was scheduled for a BCC on her right cheek. I explained the process and risks. She is willing to go to the surgical appointment.    The following portions of the chart were reviewed this encounter and updated as appropriate:      Objective  Well appearing patient in no apparent distress; mood and affect are within normal limits.  A focused examination was performed including arms, face. Relevant physical exam findings are noted in the Assessment and Plan.  Objective  Left Elbow - Posterior: Tan rough raised spot- no measurement per ks     Objective  Right Elbow - Posterior: No measurement per ks     Objective  Right Temple, left temple (4), Right Temporal Scalp: Erythematous stuck-on, waxy papule or plaque.    Assessment & Plan  Neoplasm of uncertain behavior of skin (2) Left Elbow - Posterior  Skin / nail biopsy Type of biopsy: tangential   Informed consent: discussed and consent obtained   Timeout: patient name, date of birth, surgical site, and procedure verified   Procedure prep:  Patient was prepped and draped in usual sterile fashion (Non sterile) Prep type:  Chlorhexidine Anesthesia: the lesion was anesthetized in a standard fashion   Anesthetic:  1% lidocaine w/ epinephrine 1-100,000 local infiltration Instrument used: flexible razor blade   Outcome: patient tolerated procedure well   Post-procedure details: wound care instructions given    Specimen 1 - Surgical pathology Differential Diagnosis: r/o wart, sk  Check Margins: No  Right Elbow - Posterior  Skin / nail biopsy Type of biopsy: tangential   Informed consent: discussed and consent obtained   Timeout: patient name, date of birth, surgical site, and procedure verified   Procedure prep:   Patient was prepped and draped in usual sterile fashion (Non sterile) Prep type:  Chlorhexidine Anesthesia: the lesion was anesthetized in a standard fashion   Anesthetic:  1% lidocaine w/ epinephrine 1-100,000 local infiltration Instrument used: flexible razor blade   Outcome: patient tolerated procedure well   Post-procedure details: wound care instructions given    Specimen 2 - Surgical pathology Differential Diagnosis: r/o wart  Check Margins: No  Seborrheic keratosis, inflamed (5) Right Temple, left temple (4); Right Temporal Scalp  Destruction of lesion - Right Temple, left temple, Right Temporal Scalp Complexity: simple   Destruction method: cryotherapy   Informed consent: discussed and consent obtained   Timeout:  patient name, date of birth, surgical site, and procedure verified Lesion destroyed using liquid nitrogen: Yes   Cryotherapy cycles:  3 Outcome: patient tolerated procedure well with no complications      I, Jocelynn Gioffre, PA-C, have reviewed all documentation's for this visit.  The documentation on 04/11/20 for the exam, diagnosis, procedures and orders are all accurate and complete.

## 2020-04-11 NOTE — Progress Notes (Signed)
Cardiology Office Note   Date:  04/11/2020   ID:  SEVILLE BRICK, DOB [birth year], MRN [id number]  PCP:  Binnie Rail, MD  Cardiologist:   Dorris Carnes, MD    F/U of HTN and PAF     History of Present Illness: Amy [name] is a 85 y.o. female with a history of atrial flutter (s/p ablation), PAF and HTN   I saw the pt in clinic in September 2021  Since seen the pt denies palpitations  Breathing is OK     BP is a bit labile   HIghest in 170 range  Usually 130s    NO CP    Remains fairly active     Outpatient Medications Prior to Visit  Medication Sig Dispense Refill  . acetaminophen (TYLENOL) 650 MG CR tablet Take 1,300 mg by mouth 2 (two) times daily as needed for pain.    Marland Kitchen AMBULATORY NON FORMULARY MEDICATION CBD Oil 1 dose under tongue once daily    . atenolol (TENORMIN) 25 MG tablet TAKE 1 TABLET BY MOUTH EVERY DAY 90 tablet 2  . Biotin 5000 MCG CAPS Take by mouth.    . Calcium Carbonate-Vitamin D (CALCIUM + D PO) 1 tab po qd    . Cholecalciferol 100 MCG (4000 UT) CAPS Take 1 capsule (4,000 Units total) by mouth daily. 30 capsule   . diclofenac sodium (VOLTAREN) 1 % GEL Apply 2 g topically 4 (four) times daily. To affected joint. 300 g 11  . diltiazem (CARDIZEM CD) 240 MG 24 hr capsule TAKE 1 CAPSULE BY MOUTH EVERY DAY 90 capsule 3  . dorzolamide-timolol (COSOPT) 22.3-6.8 MG/ML ophthalmic solution Place 1 drop into both eyes 2 (two) times daily.    . flecainide (TAMBOCOR) 50 MG tablet TAKE 1 TABLET BY MOUTH TWICE A DAY 180 tablet 3  . hydrALAZINE (APRESOLINE) 50 MG tablet Take 1/2 to 1 tablet three times daily 270 tablet 3  . levothyroxine (SYNTHROID) 50 MCG tablet Take 1 tablet (50 mcg total) by mouth daily. 90 tablet 3  . linaclotide (LINZESS) 72 MCG capsule Take 1 capsule (72 mcg total) by mouth daily. 30 capsule 11  . losartan (COZAAR) 25 MG tablet TAKE 2 TABLETS BY MOUTH EVERY DAY 180 tablet 1  . XARELTO 15 MG TABS tablet TAKE 1 TABLET BY MOUTH EVERY DAY WITH SUPPER  30 tablet 5   No facility-administered medications prior to visit.     Allergies:   Patient has no known allergies.   Past Medical History:  Diagnosis Date  . Anemia   . Arthritis   . Atrial fibrillation ([place])   . Atrial flutter ([place])    s/p ablation  . Atypical mole 09/07/2013   LOWER LEG SEVERE TX= WIDER SHAVE   . HTN (hypertension)   . Hypothyroidism   . Nodular basal cell carcinoma (BCC) 03/29/2020   Right Malar Cheek  . Palpitations     Past Surgical History:  Procedure Laterality Date  . A FLUTTER ABLATION    . KNEE ARTHROSCOPY Right   . TUBAL LIGATION       Social History:  The patient  reports that she has never smoked. She has never used smokeless tobacco. She reports that she does not drink alcohol and does not use drugs.   Family History:  The patient's family history includes Colon cancer in her brother; Heart disease in her mother; Stroke (age of onset: 109) in her mother; Stroke (age of onset: 70) in her  father.    ROS:  Please see the history of present illness. All other systems are reviewed and  Negative to the above problem except as noted.    PHYSICAL EXAM: VS:  BP (!) 146/60   Pulse 68   Ht 5' 5.75" (1.67 m)   Wt 135 lb (61.2 kg)   LMP  (LMP Unknown)   SpO2 96%   BMI 21.96 kg/m      GEN: Pt is in NAD  HEENT: normal  Neck: JVP is normal   No  carotid bruits  Cardiac: RRR; no murmurs  No LE edema  Respiratory:  clear to auscultation  GII: soft, nontender, nondistended, + BS  No hepatomegaly  MS: no deformity Moving all extremities   Skin: warm and dry, no rash Psych: euthymic mood, full affect   EKG:  EKG is  ordered   SR 63  First degree AV block  PR 220   RBBB Lipid Panel    Component Value Date/Time   CHOL 184 09/10/2018 1136   TRIG 81 09/10/2018 1136   HDL 101 09/10/2018 1136   CHOLHDL 1.8 09/10/2018 1136   CHOLHDL 2.0 06/05/2015 0946   VLDL 10 06/05/2015 0946   LDLCALC 67 09/10/2018 1136   LDLDIRECT 84.9 11/15/2008 0924       Wt Readings from Last 3 Encounters:  04/11/20 135 lb (61.2 kg)  11/23/19 131 lb (59.4 kg)  10/04/19 130 lb 9.6 oz (59.2 kg)      ASSESSMENT AND PLAN:  1  HTN  BP is labile per home checks Good today   She is currently tking hydralazine  50/25/25 along with 24 diltiazem and 50 mg losartan  I would recomm if SBP greater than 160 she take and extra 25 mg      2  PAF Denies palpitations   Continue current regimen    Check labs today     F/U in 6 months       Current medicines are reviewed at length with the patient today.  The patient does not have concerns regarding medicines.  Signed, Dorris Carnes, MD  04/11/2020 2:40 PM    [facility] [place], [place], [place]  [address] Phone: [phone]; Fax: [phone]

## 2020-04-11 NOTE — Patient Instructions (Addendum)
Medication Instructions:  The current medical regimen is effective;  continue present plan and medications.  *If you need a refill on your cardiac medications before your next appointment, please call your pharmacy*  Lab: Please have blood work today (CBC, BMP)  Follow-Up: At [facility], you and your health needs are our priority.  As part of our continuing mission to provide you with exceptional heart care, we have created designated Provider Care Teams.  These Care Teams include your primary Cardiologist (physician) and Advanced Practice Providers (APPs -  Physician Assistants and Nurse Practitioners) who all work together to provide you with the care you need, when you need it.  We recommend signing up for the patient portal called "MyChart".  Sign up information is provided on this After Visit Summary.  MyChart is used to connect with patients for Virtual Visits (Telemedicine).  Patients are able to view lab/test results, encounter notes, upcoming appointments, etc.  Non-urgent messages can be sent to your provider as well.   To learn more about what you can do with MyChart, go to [email or web address].    Your next appointment:   6 month(s)  The format for your next appointment:   In Person  Provider:   Dorris Carnes, MD   Thank you for choosing [facility]!!

## 2020-04-12 LAB — CBC
Hematocrit: 34.1 % (ref 34.0–46.6)
Hemoglobin: 11.4 g/dL (ref 11.1–15.9)
MCH: 32.9 pg (ref 26.6–33.0)
MCHC: 33.4 g/dL (ref 31.5–35.7)
MCV: 99 fL — ABNORMAL HIGH (ref 79–97)
Platelets: 236 10*3/uL (ref 150–450)
RBC: 3.46 x10E6/uL — ABNORMAL LOW (ref 3.77–5.28)
RDW: 11.7 % (ref 11.7–15.4)
WBC: 7.2 10*3/uL (ref 3.4–10.8)

## 2020-04-12 LAB — BASIC METABOLIC PANEL
BUN/Creatinine Ratio: 20 (ref 12–28)
BUN: 28 mg/dL — ABNORMAL HIGH (ref 8–27)
CO2: 21 mmol/L (ref 20–29)
Calcium: 10 mg/dL (ref 8.7–10.3)
Chloride: 105 mmol/L (ref 96–106)
Creatinine, Ser: 1.41 mg/dL — ABNORMAL HIGH (ref 0.57–1.00)
Glucose: 84 mg/dL (ref 65–99)
Potassium: 4.8 mmol/L (ref 3.5–5.2)
Sodium: 142 mmol/L (ref 134–144)
eGFR: 37 mL/min/{1.73_m2} — ABNORMAL LOW (ref >59)

## 2020-04-13 ENCOUNTER — Encounter: Payer: Self-pay | Admitting: Physician Assistant

## 2020-04-13 NOTE — Progress Notes (Signed)
   Follow-Up Visit   Subjective  Amy [name] is a 85 y.o. female who presents for the following: Follow-up (58MO NO REAL CONCERNS SEB K ALL OVER PER PATIENT).   The following portions of the chart were reviewed this encounter and updated as appropriate:  Tobacco  Allergies  Meds  Problems  Med Hx  Surg Hx  Fam Hx      Objective  Well appearing patient in no apparent distress; mood and affect are within normal limits.  All skin waist up examined.  Objective  waist up: No atypical nevi   Objective  Right Malar Cheek: Pink pearly papule     Objective  Left Lower Leg: White scar- clear   Assessment & Plan  Encounter for screening for malignant neoplasm of skin waist up  Yearly skin check  Neoplasm of uncertain behavior of skin Right Malar Cheek  Skin / nail biopsy Type of biopsy: tangential   Informed consent: discussed and consent obtained   Timeout: patient name, date of birth, surgical site, and procedure verified   Procedure prep:  Patient was prepped and draped in usual sterile fashion (Non sterile) Prep type:  Chlorhexidine Anesthesia: the lesion was anesthetized in a standard fashion   Anesthetic:  1% lidocaine w/ epinephrine 1-100,000 local infiltration Instrument used: flexible razor blade   Outcome: patient tolerated procedure well   Post-procedure details: wound care instructions given    Destruction of lesion Complexity: simple   Destruction method: electrodesiccation and curettage   Informed consent: discussed and consent obtained   Timeout:  patient name, date of birth, surgical site, and procedure verified Anesthesia: the lesion was anesthetized in a standard fashion   Anesthetic:  1% lidocaine w/ epinephrine 1-100,000 local infiltration Curettage performed in three different directions: Yes   Curettage cycles:  1 Margin per side (cm):  0.1 Final wound size (cm):  1.1 Hemostasis achieved with:  aluminum chloride Outcome: patient  tolerated procedure well with no complications   Post-procedure details: wound care instructions given    Specimen 1 - Surgical pathology Differential Diagnosis: bcc vs scc  Check Margins: No  History of atypical nevus Left Lower Leg  Yearly skin check    I, Ygnacio Fecteau, PA-C, have reviewed all documentation's for this visit.  The documentation on 04/13/20 for the exam, diagnosis, procedures and orders are all accurate and complete.

## 2020-04-25 LAB — SPECIMEN STATUS REPORT

## 2020-04-25 LAB — TSH: TSH: 4.13 u[IU]/mL (ref 0.450–4.500)

## 2020-05-01 ENCOUNTER — Telehealth: Payer: Self-pay | Admitting: Internal Medicine

## 2020-05-01 NOTE — Telephone Encounter (Signed)
LVM for pt to rtn my call to schedule AWV with [facility]. Please schedule appt if pt calls the office.  

## 2020-05-02 DIAGNOSIS — H04123 Dry eye syndrome of bilateral lacrimal glands: Secondary | ICD-10-CM | POA: Diagnosis not present

## 2020-05-02 DIAGNOSIS — Z961 Presence of intraocular lens: Secondary | ICD-10-CM | POA: Diagnosis not present

## 2020-05-02 DIAGNOSIS — H26493 Other secondary cataract, bilateral: Secondary | ICD-10-CM | POA: Diagnosis not present

## 2020-05-02 DIAGNOSIS — H401131 Primary open-angle glaucoma, bilateral, mild stage: Secondary | ICD-10-CM | POA: Diagnosis not present

## 2020-05-15 DIAGNOSIS — H26491 Other secondary cataract, right eye: Secondary | ICD-10-CM | POA: Diagnosis not present

## 2020-05-18 ENCOUNTER — Other Ambulatory Visit: Payer: Self-pay | Admitting: Internal Medicine

## 2020-05-18 DIAGNOSIS — E039 Hypothyroidism, unspecified: Secondary | ICD-10-CM

## 2020-05-18 NOTE — Telephone Encounter (Signed)
Pt's pharmacy is requesting a refill on levothyroxine. Would Dr. Ross like to refill this medication? Please address °

## 2020-07-03 DIAGNOSIS — C44319 Basal cell carcinoma of skin of other parts of face: Secondary | ICD-10-CM | POA: Diagnosis not present

## 2020-08-12 ENCOUNTER — Other Ambulatory Visit: Payer: Self-pay | Admitting: Internal Medicine

## 2020-09-11 ENCOUNTER — Other Ambulatory Visit: Payer: Self-pay | Admitting: Internal Medicine

## 2020-09-14 ENCOUNTER — Other Ambulatory Visit: Payer: Self-pay

## 2020-09-14 MED ORDER — LINACLOTIDE 72 MCG PO CAPS: 72.0000 ug | ORAL_CAPSULE | Freq: Every day | ORAL | 11 refills | Status: DC

## 2020-10-12 ENCOUNTER — Encounter: Payer: Self-pay | Admitting: Internal Medicine

## 2020-10-12 ENCOUNTER — Encounter: Payer: Self-pay | Admitting: Physician Assistant

## 2020-10-12 ENCOUNTER — Ambulatory Visit: Payer: Medicare PPO | Admitting: Physician Assistant

## 2020-10-12 ENCOUNTER — Ambulatory Visit: Payer: Medicare PPO | Admitting: Internal Medicine

## 2020-10-12 ENCOUNTER — Other Ambulatory Visit: Payer: Self-pay

## 2020-10-12 VITALS — BP 140/82 | HR 54 | Ht 67.0 in | Wt 133.2 lb

## 2020-10-12 DIAGNOSIS — Z86018 Personal history of other benign neoplasm: Secondary | ICD-10-CM | POA: Diagnosis not present

## 2020-10-12 DIAGNOSIS — I480 Paroxysmal atrial fibrillation: Secondary | ICD-10-CM

## 2020-10-12 DIAGNOSIS — I10 Essential (primary) hypertension: Secondary | ICD-10-CM | POA: Diagnosis not present

## 2020-10-12 DIAGNOSIS — L570 Actinic keratosis: Secondary | ICD-10-CM

## 2020-10-12 DIAGNOSIS — Z85828 Personal history of other malignant neoplasm of skin: Secondary | ICD-10-CM

## 2020-10-12 DIAGNOSIS — E039 Hypothyroidism, unspecified: Secondary | ICD-10-CM | POA: Diagnosis not present

## 2020-10-12 MED ORDER — ALCLOMETASONE DIPROPIONATE 0.05 % EX CREA: TOPICAL_CREAM | Freq: Every evening | CUTANEOUS | 0 refills | Status: DC

## 2020-10-12 NOTE — Patient Instructions (Signed)
Medication Instructions:  Your physician recommends that you continue on your current medications as directed. Please refer to the Current Medication list given to you today.  *If you need a refill on your cardiac medications before your next appointment, please call your pharmacy*   Lab Work: Lab work to be done today--CBC, BMP, TSH, Lipid profile If you have labs (blood work) drawn today and your tests are completely normal, you will receive your results only by: [place] (if you have MyChart) OR A paper copy in the mail If you have any lab test that is abnormal or we need to change your treatment, we will call you to review the results.   Testing/Procedures: none   Follow-Up: At [facility], you and your health needs are our priority.  As part of our continuing mission to provide you with exceptional heart care, we have created designated Provider Care Teams.  These Care Teams include your primary Cardiologist (physician) and Advanced Practice Providers (APPs -  Physician Assistants and Nurse Practitioners) who all work together to provide you with the care you need, when you need it.  We recommend signing up for the patient portal called "MyChart".  Sign up information is provided on this After Visit Summary.  MyChart is used to connect with patients for Virtual Visits (Telemedicine).  Patients are able to view lab/test results, encounter notes, upcoming appointments, etc.  Non-urgent messages can be sent to your provider as well.   To learn more about what you can do with MyChart, go to [email or web address].    Your next appointment:   April/May 2023  The format for your next appointment:   In Person  Provider:   You may see Dorris Carnes, MD or one of the following Advanced Practice Providers on your designated Care Team:   Richardson Dopp, PA-C Robbie Lis, [place]   Other Instructions

## 2020-10-12 NOTE — Progress Notes (Signed)
Cardiology Office Note   Date:  10/12/2020   ID:  Amy [name], DOB 1936-01-06, MRN [id number]  PCP:  Binnie Rail, MD  Cardiologist:   Dorris Carnes, MD    F/U of HTN and PAF     History of Present Illness: Amy [name] is a 85 y.o. female with a history of atrial flutter (s/p ablation), PAF and HTN   I saw the pt in clinic in Spring 2022  The pt denies palpitations   Breathing is OK    Remains active     Outpatient Medications Prior to Visit  Medication Sig Dispense Refill   acetaminophen (TYLENOL) 650 MG CR tablet Take 1,300 mg by mouth 2 (two) times daily as needed for pain.     alclomethasone (ACLOVATE) 0.05 % cream Apply topically at bedtime. 60 g 0   AMBULATORY NON FORMULARY MEDICATION CBD Oil 1 dose under tongue once daily     atenolol (TENORMIN) 25 MG tablet TAKE 1 TABLET BY MOUTH EVERY DAY 90 tablet 2   Biotin 5000 MCG CAPS Take by mouth.     Calcium Carbonate-Vitamin D (CALCIUM + D PO) 1 tab po qd     Cholecalciferol 100 MCG (4000 UT) CAPS Take 1 capsule (4,000 Units total) by mouth daily. 30 capsule    diclofenac sodium (VOLTAREN) 1 % GEL Apply 2 g topically 4 (four) times daily. To affected joint. 300 g 11   diltiazem (CARDIZEM CD) 240 MG 24 hr capsule TAKE 1 CAPSULE BY MOUTH EVERY DAY 90 capsule 3   dorzolamide-timolol (COSOPT) 22.3-6.8 MG/ML ophthalmic solution Place 1 drop into both eyes 2 (two) times daily.     flecainide (TAMBOCOR) 50 MG tablet TAKE 1 TABLET BY MOUTH TWICE A DAY 180 tablet 3   hydrALAZINE (APRESOLINE) 50 MG tablet Take 1/2 to 1 tablet by mouth three times daily 270 tablet 1   levothyroxine (SYNTHROID) 50 MCG tablet TAKE 1 TABLET BY MOUTH EVERY DAY 90 tablet 3   linaclotide (LINZESS) 72 MCG capsule Take 1 capsule (72 mcg total) by mouth daily. 30 capsule 11   losartan (COZAAR) 25 MG tablet TAKE 2 TABLETS BY MOUTH EVERY DAY 180 tablet 2   XARELTO 15 MG TABS tablet TAKE 1 TABLET BY MOUTH EVERY DAY WITH SUPPER 30 tablet 5   No  facility-administered medications prior to visit.     Allergies:   Patient has no known allergies.   Past Medical History:  Diagnosis Date   Anemia    Arthritis    Atrial fibrillation (HCC)    Atrial flutter ([place])    s/p ablation   Atypical mole 09/07/2013   LOWER LEG SEVERE TX= WIDER SHAVE    HTN (hypertension)    Hypothyroidism    Nodular basal cell carcinoma (BCC) 03/29/2020   Right Malar Cheek   Palpitations     Past Surgical History:  Procedure Laterality Date   A FLUTTER ABLATION     KNEE ARTHROSCOPY Right    TUBAL LIGATION       Social History:  The patient  reports that she has never smoked. She has never used smokeless tobacco. She reports that she does not drink alcohol and does not use drugs.   Family History:  The patient's family history includes Colon cancer in her brother; Heart disease in her mother; Stroke (age of onset: 68) in her mother; Stroke (age of onset: 65) in her father.    ROS:  Please see the history of  present illness. All other systems are reviewed and  Negative to the above problem except as noted.    PHYSICAL EXAM: VS:  BP 140/82   Pulse (!) 54 Comment: 50 in right arm  Ht 5\' 7"  (1.702 m)   Wt 133 lb 3.2 oz (60.4 kg)   LMP  (LMP Unknown)   SpO2 98%   BMI 20.86 kg/m      GEN: Thin 85 yo who is in NAD  HEENT: normal  Neck: JVP is normal   No  carotid bruits  Cardiac: RRR; no murmurs  No LE edema  Respiratory:  clear to auscultation  GII: soft, nontender, nondistended, + BS  No hepatomegaly  MS: no deformity Moving all extremities   Skin: warm and dry, no rash Psych: euthymic mood, full affect   EKG:  EKG is not   ordered    Lipid Panel    Component Value Date/Time   CHOL 184 09/10/2018 1136   TRIG 81 09/10/2018 1136   HDL 101 09/10/2018 1136   CHOLHDL 1.8 09/10/2018 1136   CHOLHDL 2.0 06/05/2015 0946   VLDL 10 06/05/2015 0946   LDLCALC 67 09/10/2018 1136   LDLDIRECT 84.9 11/15/2008 0924      Wt Readings from Last  3 Encounters:  10/12/20 133 lb 3.2 oz (60.4 kg)  04/11/20 135 lb (61.2 kg)  11/23/19 131 lb (59.4 kg)      ASSESSMENT AND PLAN:  1  HTN  BP is fair   SHe says some days she is sluggish when HR is low    I would keep on current regimen    Follow         2  PAF Denies palpitations  Continue atenolol and dilt and flecanide     Check BMET, CBC, TSH and lipids today    F/U in 6 months       Current medicines are reviewed at length with the patient today.  The patient does not have concerns regarding medicines.  Signed, Dorris Carnes, MD  10/12/2020 3:44 PM    [address], [place], [place]  [address] Phone: [phone]; Fax: [phone]

## 2020-10-12 NOTE — Progress Notes (Signed)
   Follow-Up Visit   Subjective  Amy [name] is a 85 y.o. female who presents for the following: Follow-up (Patient here today for follow up from [facility] on right malar cheek per patient area healed well but has a scar. Per patient she has some dark lesions on her back that she would like checked, per patient the lesions don't bother her she would just like them checked. Personal history of atypical moles and non mole skin cancer. ).   The following portions of the chart were reviewed this encounter and updated as appropriate:  Tobacco  Allergies  Meds  Problems  Med Hx  Surg Hx  Fam Hx      Objective  Well appearing patient in no apparent distress; mood and affect are within normal limits.  All skin waist up examined.  Right Malar Cheek Clear with hypertrophic scar.     Mid Back Erythematous plaque.          Assessment & Plan  History of basal cell carcinoma (BCC) Right Malar Cheek  alclomethasone (ACLOVATE) 0.05 % cream - Right Malar Cheek Apply topically at bedtime.  Lichenoid actinic keratosis Mid Back  Destruction of lesion - Mid Back Complexity: simple   Destruction method: cryotherapy   Informed consent: discussed and consent obtained   Timeout:  patient name, date of birth, surgical site, and procedure verified Lesion destroyed using liquid nitrogen: Yes   Cryotherapy cycles:  2 Outcome: patient tolerated procedure well with no complications   Post-procedure details: wound care instructions given      I, Shantell Belongia, PA-C, have reviewed all documentation's for this visit.  The documentation on 10/12/20 for the exam, diagnosis, procedures and orders are all accurate and complete.

## 2020-10-13 LAB — CBC
Hematocrit: 35.2 % (ref 34.0–46.6)
Hemoglobin: 11.6 g/dL (ref 11.1–15.9)
MCH: 32.2 pg (ref 26.6–33.0)
MCHC: 33 g/dL (ref 31.5–35.7)
MCV: 98 fL — ABNORMAL HIGH (ref 79–97)
Platelets: 284 10*3/uL (ref 150–450)
RBC: 3.6 x10E6/uL — ABNORMAL LOW (ref 3.77–5.28)
RDW: 11.7 % (ref 11.7–15.4)
WBC: 8.6 10*3/uL (ref 3.4–10.8)

## 2020-10-13 LAB — TSH: TSH: 4.77 u[IU]/mL — ABNORMAL HIGH (ref 0.450–4.500)

## 2020-10-13 LAB — BASIC METABOLIC PANEL
BUN/Creatinine Ratio: 19 (ref 12–28)
BUN: 29 mg/dL — ABNORMAL HIGH (ref 8–27)
CO2: 21 mmol/L (ref 20–29)
Calcium: 10.1 mg/dL (ref 8.7–10.3)
Chloride: 101 mmol/L (ref 96–106)
Creatinine, Ser: 1.51 mg/dL — ABNORMAL HIGH (ref 0.57–1.00)
Glucose: 96 mg/dL (ref 65–99)
Potassium: 5.1 mmol/L (ref 3.5–5.2)
Sodium: 136 mmol/L (ref 134–144)
eGFR: 34 mL/min/{1.73_m2} — ABNORMAL LOW (ref >59)

## 2020-10-13 LAB — LIPID PANEL
Chol/HDL Ratio: 2.3 ratio (ref 0.0–4.4)
Cholesterol, Total: 219 mg/dL — ABNORMAL HIGH (ref 100–199)
HDL: 95 mg/dL (ref >39)
LDL Chol Calc (NIH): 109 mg/dL — ABNORMAL HIGH (ref 0–99)
Triglycerides: 85 mg/dL (ref 0–149)
VLDL Cholesterol Cal: 15 mg/dL (ref 5–40)

## 2020-10-15 ENCOUNTER — Other Ambulatory Visit: Payer: Self-pay | Admitting: Internal Medicine

## 2020-11-13 DIAGNOSIS — H401131 Primary open-angle glaucoma, bilateral, mild stage: Secondary | ICD-10-CM | POA: Diagnosis not present

## 2020-11-13 DIAGNOSIS — H26492 Other secondary cataract, left eye: Secondary | ICD-10-CM | POA: Diagnosis not present

## 2020-11-13 DIAGNOSIS — H04123 Dry eye syndrome of bilateral lacrimal glands: Secondary | ICD-10-CM | POA: Diagnosis not present

## 2020-11-22 ENCOUNTER — Encounter: Payer: Self-pay | Admitting: Internal Medicine

## 2020-11-22 NOTE — Patient Instructions (Addendum)
Medications changes include :   none    Please followup in 1 year   Health Maintenance, Female Adopting a healthy lifestyle and getting preventive care are important in promoting health and wellness. Ask your health care provider about: The right schedule for you to have regular tests and exams. Things you can do on your own to prevent diseases and keep yourself healthy. What should I know about diet, weight, and exercise? Eat a healthy diet  Eat a diet that includes plenty of vegetables, fruits, low-fat dairy products, and lean protein. Do not eat a lot of foods that are high in solid fats, added sugars, or sodium. Maintain a healthy weight Body mass index (BMI) is used to identify weight problems. It estimates body fat based on height and weight. Your health care provider can help determine your BMI and help you achieve or maintain a healthy weight. Get regular exercise Get regular exercise. This is one of the most important things you can do for your health. Most adults should: Exercise for at least 150 minutes each week. The exercise should increase your heart rate and make you sweat (moderate-intensity exercise). Do strengthening exercises at least twice a week. This is in addition to the moderate-intensity exercise. Spend less time sitting. Even light physical activity can be beneficial. Watch cholesterol and blood lipids Have your blood tested for lipids and cholesterol at 83 years of age, then have this test every 5 years. Have your cholesterol levels checked more often if: Your lipid or cholesterol levels are high. You are older than 83 years of age. You are at high risk for heart disease. What should I know about cancer screening? Depending on your health history and family history, you may need to have cancer screening at various ages. This may include screening for: Breast cancer. Cervical cancer. Colorectal cancer. Skin cancer. Lung cancer. What should I know  about heart disease, diabetes, and high blood pressure? Blood pressure and heart disease High blood pressure causes heart disease and increases the risk of stroke. This is more likely to develop in people who have high blood pressure readings, are of African descent, or are overweight. Have your blood pressure checked: Every 3-5 years if you are 23-28 years of age. Every year if you are 102 years old or older. Diabetes Have regular diabetes screenings. This checks your fasting blood sugar level. Have the screening done: Once every three years after age 30 if you are at a normal weight and have a low risk for diabetes. More often and at a younger age if you are overweight or have a high risk for diabetes. What should I know about preventing infection? Hepatitis B If you have a higher risk for hepatitis B, you should be screened for this virus. Talk with your health care provider to find out if you are at risk for hepatitis B infection. Hepatitis C Testing is recommended for: Everyone born from [birth year] through 1963. Anyone with known risk factors for hepatitis C. Sexually transmitted infections (STIs) Get screened for STIs, including gonorrhea and chlamydia, if: You are sexually active and are younger than 83 years of age. You are older than 83 years of age and your health care provider tells you that you are at risk for this type of infection. Your sexual activity has changed since you were last screened, and you are at increased risk for chlamydia or gonorrhea. Ask your health care provider if you are at risk. Ask your health  care provider about whether you are at high risk for HIV. Your health care provider may recommend a prescription medicine to help prevent HIV infection. If you choose to take medicine to prevent HIV, you should first get tested for HIV. You should then be tested every 3 months for as long as you are taking the medicine. Pregnancy If you are about to stop having your period  (premenopausal) and you may become pregnant, seek counseling before you get pregnant. Take 400 to 800 micrograms (mcg) of folic acid every day if you become pregnant. Ask for birth control (contraception) if you want to prevent pregnancy. Osteoporosis and menopause Osteoporosis is a disease in which the bones lose minerals and strength with aging. This can result in bone fractures. If you are 64 years old or older, or if you are at risk for osteoporosis and fractures, ask your health care provider if you should: Be screened for bone loss. Take a calcium or vitamin D supplement to lower your risk of fractures. Be given hormone replacement therapy (HRT) to treat symptoms of menopause. Follow these instructions at home: Lifestyle Do not use any products that contain nicotine or tobacco, such as cigarettes, e-cigarettes, and chewing tobacco. If you need help quitting, ask your health care provider. Do not use street drugs. Do not share needles. Ask your health care provider for help if you need support or information about quitting drugs. Alcohol use Do not drink alcohol if: Your health care provider tells you not to drink. You are pregnant, may be pregnant, or are planning to become pregnant. If you drink alcohol: Limit how much you use to 0-1 drink a day. Limit intake if you are breastfeeding. Be aware of how much alcohol is in your drink. In the [place] one drink equals one 12 oz bottle of beer (355 mL), one 5 oz glass of wine (148 mL), or one 1 oz glass of hard liquor (44 mL). General instructions Schedule regular health, dental, and eye exams. Stay current with your vaccines. Tell your health care provider if: You often feel depressed. You have ever been abused or do not feel safe at home. Summary Adopting a healthy lifestyle and getting preventive care are important in promoting health and wellness. Follow your health care provider's instructions about healthy diet, exercising, and  getting tested or screened for diseases. Follow your health care provider's instructions on monitoring your cholesterol and blood pressure. This information is not intended to replace advice given to you by your health care provider. Make sure you discuss any questions you have with your health care provider. Document Revised: 03/17/2020 Document Reviewed: 12/31/2017 Elsevier Patient Education  2022 [employer].

## 2020-11-22 NOTE — Progress Notes (Signed)
Subjective:    Patient ID: Amy [name], female    DOB: 12-28-1935, 85 y.o.   MRN: [id number]   This visit occurred during the SARS-CoV-2 public health emergency.  Safety protocols were in place, including screening questions prior to the visit, additional usage of staff PPE, and extensive cleaning of exam room while observing appropriate contact time as indicated for disinfecting solutions.    HPI She is here for a physical exam.   She has b/l  knee OA - has used diclofenac gel in the past.  No regualr exercise.  Her knee pain is getting worse and she wonders what else she can do.  She has been taking Tylenol once a day.  That does help.     Medications and allergies reviewed with patient and updated if appropriate.  Patient Active Problem List   Diagnosis Date Noted   Osteoporosis 11/23/2019   Low vitamin B12 level 11/23/2019   CKD (chronic kidney disease) stage 3, GFR 30-59 ml/min (HCC) 11/22/2019   Hyperglycemia 11/22/2019   Degenerative arthritis of knee, bilateral 04/04/2017   Chronic idiopathic constipation 02/18/2017   Osteoarthritis 12/03/2016   Tinnitus of both ears 12/03/2016   Hypothyroidism 01/28/2016   Primary osteoarthritis involving multiple joints 01/28/2016   Overactive bladder 01/26/2016   Premature atrial contractions 07/03/2010   Atrial fibrillation ([place]) 06/05/2010   Essential hypertension 09/03/2008   ARTHROSCOPY, KNEE, HX OF 09/03/2008    Current Outpatient Medications on File Prior to Visit  Medication Sig Dispense Refill   acetaminophen (TYLENOL) 650 MG CR tablet Take 1,300 mg by mouth 2 (two) times daily as needed for pain.     alclomethasone (ACLOVATE) 0.05 % cream Apply topically at bedtime. 60 g 0   AMBULATORY NON FORMULARY MEDICATION CBD Oil 1 dose under tongue once daily     atenolol (TENORMIN) 25 MG tablet TAKE 1 TABLET BY MOUTH EVERY DAY 90 tablet 2   Biotin 5000 MCG CAPS Take by mouth.     Calcium Carbonate-Vitamin D (CALCIUM + D  PO) 1 tab po qd     Cholecalciferol 100 MCG (4000 UT) CAPS Take 1 capsule (4,000 Units total) by mouth daily. 30 capsule    diclofenac sodium (VOLTAREN) 1 % GEL Apply 2 g topically 4 (four) times daily. To affected joint. 300 g 11   diltiazem (CARDIZEM CD) 240 MG 24 hr capsule TAKE 1 CAPSULE BY MOUTH EVERY DAY 90 capsule 3   dorzolamide-timolol (COSOPT) 22.3-6.8 MG/ML ophthalmic solution Place 1 drop into both eyes 2 (two) times daily.     flecainide (TAMBOCOR) 50 MG tablet TAKE 1 TABLET BY MOUTH TWICE A DAY 180 tablet 3   hydrALAZINE (APRESOLINE) 50 MG tablet Take 1/2 to 1 tablet by mouth three times daily 270 tablet 1   levothyroxine (SYNTHROID) 50 MCG tablet TAKE 1 TABLET BY MOUTH EVERY DAY 90 tablet 3   linaclotide (LINZESS) 72 MCG capsule Take 1 capsule (72 mcg total) by mouth daily. 30 capsule 11   losartan (COZAAR) 25 MG tablet TAKE 2 TABLETS BY MOUTH EVERY DAY 180 tablet 2   XARELTO 15 MG TABS tablet TAKE 1 TABLET BY MOUTH EVERY DAY WITH SUPPER 30 tablet 5   No current facility-administered medications on file prior to visit.    Past Medical History:  Diagnosis Date   Anemia    Arthritis    Atrial fibrillation [facility])    Atrial flutter [facility])    s/p ablation   Atypical mole 09/07/2013  LOWER LEG SEVERE TX= WIDER SHAVE    HTN (hypertension)    Hypothyroidism    Nodular basal cell carcinoma (BCC) 03/29/2020   Right Malar Cheek   Palpitations     Past Surgical History:  Procedure Laterality Date   A FLUTTER ABLATION     KNEE ARTHROSCOPY Right    TUBAL LIGATION      Social History   Socioeconomic History   Marital status: Married    Spouse name: Not on file   Number of children: 1   Years of education: Not on file   Highest education level: Not on file  Occupational History   Occupation: Retired  Tobacco Use   Smoking status: Never   Smokeless tobacco: Never  Vaping Use   Vaping Use: Never used  Substance and Sexual Activity   Alcohol use: No   Drug use: No    Sexual activity: Not on file  Other Topics Concern   Not on file  Social History Narrative   Occ caffeine    Social Determinants of Health   Financial Resource Strain: Not on file  Food Insecurity: Not on file  Transportation Needs: Not on file  Physical Activity: Not on file  Stress: Not on file  Social Connections: Not on file    Family History  Problem Relation Age of Onset   Stroke Father 94       died from stroke at age 39   Stroke Mother 20       died from stroke at age 60   Heart disease Mother    Colon cancer Brother     Review of Systems  Constitutional:  Negative for fever.  Eyes:  Negative for visual disturbance.  Respiratory:  Negative for cough, shortness of breath and wheezing.   Cardiovascular:  Negative for chest pain, palpitations and leg swelling.  Gastrointestinal:  Negative for abdominal pain, blood in stool, constipation, diarrhea and nausea.  Genitourinary:  Negative for dysuria.  Musculoskeletal:  Positive for arthralgias (Knees), back pain and joint swelling (Knees).  Skin:  Negative for rash.  Neurological:  Negative for light-headedness and headaches.  Psychiatric/Behavioral:  Negative for dysphoric mood. The patient is not nervous/anxious.       Objective:   Vitals:   11/23/20 1104  BP: 130/74  Pulse: 63  Temp: 98 F (36.7 C)  SpO2: 95%   Filed Weights   11/23/20 1104  Weight: 133 lb (60.3 kg)   Body mass index is 20.83 kg/m.  BP Readings from Last 3 Encounters:  11/23/20 130/74  10/12/20 140/82  04/11/20 (!) 146/60    Wt Readings from Last 3 Encounters:  11/23/20 133 lb (60.3 kg)  10/12/20 133 lb 3.2 oz (60.4 kg)  04/11/20 135 lb (61.2 kg)    Depression screen [facility] 2/9 11/23/2020 11/23/2019 03/25/2018 07/10/2017 04/08/2017  Decreased Interest 0 0 0 0 0  Down, Depressed, Hopeless 0 0 0 0 0  PHQ - 2 Score 0 0 0 0 0  Altered sleeping 0 - - - -  Tired, decreased energy 0 - - - -  Change in appetite 0 - - - -  Feeling bad or  failure about yourself  0 - - - -  Trouble concentrating 0 - - - -  Moving slowly or fidgety/restless 0 - - - -  Suicidal thoughts 0 - - - -  PHQ-9 Score 0 - - - -    GAD 7 : Generalized Anxiety Score 11/23/2020  Nervous, Anxious,  on Edge 0  Control/stop worrying 0  Worry too much - different things 0  Trouble relaxing 0  Restless 0  Easily annoyed or irritable 0  Afraid - awful might happen 0  Total GAD 7 Score 0       Physical Exam Constitutional: She appears well-developed and well-nourished. No distress.  HENT:  Head: Normocephalic and atraumatic.  Right Ear: External ear normal. Normal ear canal and TM Left Ear: External ear normal.  Normal ear canal and TM Mouth/Throat: Oropharynx is clear and moist.  Eyes: Conjunctivae and EOM are normal.  Neck: Neck supple. No tracheal deviation present. No thyromegaly present.  No carotid bruit  Cardiovascular: Normal rate, regular rhythm and normal heart sounds.   No murmur heard.  No edema. Pulmonary/Chest: Effort normal and breath sounds normal. No respiratory distress. She has no wheezes. She has no rales.  Breast: deferred   Abdominal: Soft. She exhibits no distension. There is no tenderness.  Lymphadenopathy: She has no cervical adenopathy.  Skin: Skin is warm and dry. She is not diaphoretic.  Psychiatric: She has a normal mood and affect. Her behavior is normal.     Lab Results  Component Value Date   WBC 8.6 10/12/2020   HGB 11.6 10/12/2020   HCT 35.2 10/12/2020   PLT 284 10/12/2020   GLUCOSE 96 10/12/2020   CHOL 219 (H) 10/12/2020   TRIG 85 10/12/2020   HDL 95 10/12/2020   LDLDIRECT 84.9 11/15/2008   LDLCALC 109 (H) 10/12/2020   NA 136 10/12/2020   K 5.1 10/12/2020   CL 101 10/12/2020   CREATININE 1.51 (H) 10/12/2020   BUN 29 (H) 10/12/2020   CO2 21 10/12/2020   TSH 4.770 (H) 10/12/2020   INR 1.4 (H) 03/28/2011         Assessment & Plan:   Physical exam: Screening blood work deferred-had blood work  done recently Exercise none Weight normal Substance abuse  none   Screened for depression using the PHQ 9 scale.  No evidence of depression.   Screened for anxiety using GAD7 Scale.  No evidence of anxiety.   Reviewed recommended immunizations.   Health Maintenance  Topic Date Due   Pneumonia Vaccine 39+ Years old (1 - PCV) Never done   TETANUS/TDAP  Never done   Zoster Vaccines- Shingrix (1 of 2) Never done   COVID-19 Vaccine (4 - Booster for Pfizer series) 01/15/2020   DEXA SCAN  03/12/2020   COLONOSCOPY (Pts 45-54yrs Insurance coverage will need to be confirmed)  06/14/2022   HPV VACCINES  Aged Out   INFLUENZA VACCINE  Discontinued          See Problem List for Assessment and Plan of chronic medical problems.

## 2020-11-23 ENCOUNTER — Other Ambulatory Visit: Payer: Self-pay

## 2020-11-23 ENCOUNTER — Ambulatory Visit (INDEPENDENT_AMBULATORY_CARE_PROVIDER_SITE_OTHER): Payer: Medicare PPO | Admitting: Internal Medicine

## 2020-11-23 VITALS — BP 130/74 | HR 63 | Temp 98.0°F | Ht 67.0 in | Wt 133.0 lb

## 2020-11-23 DIAGNOSIS — N1832 Chronic kidney disease, stage 3b: Secondary | ICD-10-CM | POA: Diagnosis not present

## 2020-11-23 DIAGNOSIS — R739 Hyperglycemia, unspecified: Secondary | ICD-10-CM | POA: Diagnosis not present

## 2020-11-23 DIAGNOSIS — E039 Hypothyroidism, unspecified: Secondary | ICD-10-CM | POA: Diagnosis not present

## 2020-11-23 DIAGNOSIS — M810 Age-related osteoporosis without current pathological fracture: Secondary | ICD-10-CM

## 2020-11-23 DIAGNOSIS — Z0000 Encounter for general adult medical examination without abnormal findings: Secondary | ICD-10-CM

## 2020-11-23 DIAGNOSIS — E538 Deficiency of other specified B group vitamins: Secondary | ICD-10-CM | POA: Diagnosis not present

## 2020-11-23 DIAGNOSIS — I4891 Unspecified atrial fibrillation: Secondary | ICD-10-CM

## 2020-11-23 DIAGNOSIS — I10 Essential (primary) hypertension: Secondary | ICD-10-CM | POA: Diagnosis not present

## 2020-11-23 DIAGNOSIS — K5904 Chronic idiopathic constipation: Secondary | ICD-10-CM

## 2020-11-23 DIAGNOSIS — M170 Bilateral primary osteoarthritis of knee: Secondary | ICD-10-CM

## 2020-11-23 DIAGNOSIS — Z1331 Encounter for screening for depression: Secondary | ICD-10-CM

## 2020-11-23 NOTE — Assessment & Plan Note (Signed)
Chronic Last thyroid test in the normal range Managed by cardiology-continue current dose

## 2020-11-23 NOTE — Assessment & Plan Note (Signed)
Chronic Recent glucose in normal range

## 2020-11-23 NOTE — Assessment & Plan Note (Signed)
Chronic Blood pressure well controlled Management per cardiology

## 2020-11-23 NOTE — Assessment & Plan Note (Signed)
Chronic Controlled Continue Linzess 72 mcg daily as needed

## 2020-11-23 NOTE — Assessment & Plan Note (Addendum)
Chronic Having increased pain Discussed treatment options Deferred referral at this time to sports medicine Continue Tylenol as needed Discussed topical treatments Can try ice, heat Encouraged regular exercise, especially strengthening exercises for the leg

## 2020-11-23 NOTE — Assessment & Plan Note (Signed)
Chronic Deferred further DEXA scans-she is not interested in any treatment Encourage regular exercise Continue calcium and vitamin D

## 2020-11-23 NOTE — Assessment & Plan Note (Signed)
Chronic Stable 

## 2020-11-23 NOTE — Assessment & Plan Note (Signed)
Chronic Following with cardiology On Xarelto 50 mg daily, diltiazem and flecainide Recent blood work reviewed

## 2020-11-23 NOTE — Assessment & Plan Note (Signed)
Chronic B12 supplementation

## 2020-12-01 DIAGNOSIS — M25562 Pain in left knee: Secondary | ICD-10-CM | POA: Diagnosis not present

## 2020-12-04 ENCOUNTER — Other Ambulatory Visit: Payer: Self-pay | Admitting: Internal Medicine

## 2020-12-04 NOTE — Telephone Encounter (Signed)
Prescription refill request for Xarelto received.  Indication: Afib  Last office visit:10/12/20 Harrington Challenger)  Weight: 60.3kg Age: 85 Scr: 1.51 (10/12/20) CrCl: 26.20ml/min  Appropriate dose and refill sent to requested pharmacy.

## 2020-12-05 DIAGNOSIS — I10 Essential (primary) hypertension: Secondary | ICD-10-CM | POA: Diagnosis not present

## 2020-12-06 ENCOUNTER — Telehealth: Payer: Self-pay | Admitting: Internal Medicine

## 2020-12-06 DIAGNOSIS — I10 Essential (primary) hypertension: Secondary | ICD-10-CM

## 2020-12-06 DIAGNOSIS — Z79899 Other long term (current) drug therapy: Secondary | ICD-10-CM

## 2020-12-06 NOTE — Telephone Encounter (Signed)
Pt c/o BP issue: STAT if pt c/o blurred vision, one-sided weakness or slurred speech  1. What are your last 5 BP readings?  CURRENT BP 157/61 82 206/95 4 pm yesterday 188/83 yesterday before morning meds 230/88 p71 temp 97.3 went to Urgent care 171/73 154/63 at bedtime yesterday  2. Are you having any other symptoms (ex. Dizziness, headache, blurred vision, passed out)? NO DIZZINESS  NO HEADACHE NO BLURRED VISION PT FEELS FINE  3. What is your BP issue? WENT TO URGENT CARE YESTERDAY WAS GIVEN 2 CLONIDINE 0.1 MG AT THE OFFICE. PT WAS GIVEN A SCRIPT CLONIDINE 0.1 MG. PT HAVE NOT TAKEN ANY OF THE SCRIPT, PT WANTS TO KNOW IF IT'S OKAY FOR HER TO TAKE THIS MED BECAUSE GOOGLE SAID IT'S FOR ANXIETY  PT WANTS TO KNOW IF IT IS OKAY FOR HER TO TAKE HER REGULAR MORNING MEDS.

## 2020-12-06 NOTE — Telephone Encounter (Signed)
Pt says her BP has been elevated for several days on and off.... she takes her BP randomly throughout the days... she has not developed any headaches, dizziness, chest p[ain...   Her readings yesterday were the highest they have been and not improving on their own as it typically does:   BP 157/61 82 206/95 4 pm yesterday 188/83 yesterday before morning meds 230/88 p71 temp 97.3 went to Urgent care 171/73 154/63 at bedtime yesterday  She was given Clonidine 0.1 mg but has not taken it... her BP today is 114/70.   I have asked her to keep a "regular" record of her BP readings... she says she feels well but concerned about her BP.. I have advised her that she needs to keep track and if elevated again then she should start the Clonidine... I will froward to Dr. Harrington Challenger for her review and if she would like make a change to her BP meds but hopefully have a better log of her readings prior to her review.

## 2020-12-08 ENCOUNTER — Other Ambulatory Visit: Payer: Self-pay

## 2020-12-08 MED ORDER — LOSARTAN POTASSIUM 25 MG PO TABS: 25.0000 mg | ORAL_TABLET | Freq: Two times a day (BID) | ORAL | 3 refills | Status: DC

## 2020-12-08 MED ORDER — LOSARTAN POTASSIUM-HCTZ 50-12.5 MG PO TABS: 1.0000 | ORAL_TABLET | Freq: Every day | ORAL | 3 refills | Status: DC

## 2020-12-08 NOTE — Telephone Encounter (Signed)
Pt advised and will send her instructions to her My Chart per her request.

## 2020-12-08 NOTE — Telephone Encounter (Signed)
Reviewed events of 11/15 with patient  BP was still labile, up to 160 at times since visitr to urgent care  110s to 160 Recomm Stop losartan 26 bid  Start losartan/HCTZ 50/12.5 daily   Follow BP  BMET in a couple weeks  Keep taking other meds

## 2020-12-15 ENCOUNTER — Telehealth: Payer: Self-pay | Admitting: Nurse Practitioner

## 2020-12-15 ENCOUNTER — Other Ambulatory Visit: Payer: Self-pay | Admitting: Nurse Practitioner

## 2020-12-15 MED ORDER — CLONIDINE HCL 0.1 MG PO TABS: 0.1000 mg | ORAL_TABLET | Freq: Two times a day (BID) | ORAL | 6 refills | Status: DC

## 2020-12-15 NOTE — Telephone Encounter (Signed)
   Patient called this morning secondary to erratic blood pressures over the past week.  She was seen in urgent care on November 15 secondary to hypertension and pressures in the 200s.  She was given a prescription for clonidine 0.1 mg as needed.  Following this, she contacted our office and was switched from losartan 25 mg twice daily to losartan HCTZ 50/12.5 mg daily.  She was advised to use clonidine 0.1 mg as needed.  Since then, she is required clonidine 0.1 or 0.2 mg daily secondary to pressures rising into the 170s.  After taking clonidine, pressure does normalize.  Heart rates are trending in the 60s to 70s.  I reviewed patient's medicine list with her today as well as previous recommendations.  I suspect she is having some rebound hypertension in the setting of using clonidine once a day.  She is about to run out of this.  I sent in a prescription for clonidine 0.1 mg twice daily to her pharmacy in [place].  She will continue to trend blood pressures over the weekend and call back sometime next week if hypertension persists.  Caller verbalized understanding and was grateful for the call back.  Murray Hodgkins, NP 12/15/2020, 11:23 AM

## 2020-12-22 ENCOUNTER — Other Ambulatory Visit: Payer: Self-pay

## 2020-12-22 ENCOUNTER — Other Ambulatory Visit: Payer: Medicare PPO | Admitting: *Deleted

## 2020-12-22 DIAGNOSIS — Z79899 Other long term (current) drug therapy: Secondary | ICD-10-CM

## 2020-12-22 DIAGNOSIS — I10 Essential (primary) hypertension: Secondary | ICD-10-CM | POA: Diagnosis not present

## 2020-12-22 LAB — BASIC METABOLIC PANEL
BUN/Creatinine Ratio: 20 (ref 12–28)
BUN: 33 mg/dL — ABNORMAL HIGH (ref 8–27)
CO2: 24 mmol/L (ref 20–29)
Calcium: 9.5 mg/dL (ref 8.7–10.3)
Chloride: 101 mmol/L (ref 96–106)
Creatinine, Ser: 1.68 mg/dL — ABNORMAL HIGH (ref 0.57–1.00)
Glucose: 101 mg/dL — ABNORMAL HIGH (ref 70–99)
Potassium: 4.5 mmol/L (ref 3.5–5.2)
Sodium: 139 mmol/L (ref 134–144)
eGFR: 30 mL/min/{1.73_m2} — ABNORMAL LOW (ref >59)

## 2020-12-24 ENCOUNTER — Encounter: Payer: Self-pay | Admitting: Internal Medicine

## 2020-12-24 DIAGNOSIS — Z79899 Other long term (current) drug therapy: Secondary | ICD-10-CM

## 2020-12-24 DIAGNOSIS — I10 Essential (primary) hypertension: Secondary | ICD-10-CM

## 2020-12-24 DIAGNOSIS — E039 Hypothyroidism, unspecified: Secondary | ICD-10-CM

## 2020-12-25 ENCOUNTER — Telehealth: Payer: Self-pay | Admitting: Internal Medicine

## 2020-12-25 MED ORDER — LOSARTAN POTASSIUM 50 MG PO TABS: 50.0000 mg | ORAL_TABLET | Freq: Two times a day (BID) | ORAL | 3 refills | Status: DC

## 2020-12-25 NOTE — Telephone Encounter (Signed)
Pt advised med changes and labs to be done 01/04/21.

## 2020-12-25 NOTE — Telephone Encounter (Signed)
Patient feels fine except when BP high she will get flushed BP 106 to 200 Recent highs 170 to 180  Taking  Hyzaar 50/12.5 Hydralazine 50 tid Atenolol 25 Dilt 240 Clonidine as needed   0.1 for high BP  Recomm  Clonidine regularly bid Losartan 50 bid   Will need to have this called in  Labs 1.5 weeks with TSH, BMP   This all started in November Drinking more water     Please call pt when rx has been sent

## 2021-01-01 ENCOUNTER — Telehealth: Payer: Self-pay | Admitting: Internal Medicine

## 2021-01-01 NOTE — Telephone Encounter (Signed)
Increase clonidine to 0.1 mg 3x per day INcrease hydralazine to 75 mg 3x per day Set patient up for renal artery ultrasound Set patient to be seen in HTN clinic, if possible this week Bring cuff, bring readings

## 2021-01-01 NOTE — Telephone Encounter (Signed)
Patient wanted to get an appt to see DR. Ross, She has been communication with the Nurse through [employer] and is concerned about her symptoms

## 2021-01-01 NOTE — Telephone Encounter (Signed)
Patient is follow up.

## 2021-01-01 NOTE — Telephone Encounter (Signed)
See MyChart message below.

## 2021-01-01 NOTE — Telephone Encounter (Signed)
Spoke with the patient who states that she already takes hydralazine 50 mg three times per day. She reports that she is not symptomatic but just concerned that her pressure is so high. Recent reading of 190/83. She states blood pressure has been elevated everyday around 3 pm. I advised the patient to go ahead and take her evening medications and to relax to help bring BP down.

## 2021-01-02 ENCOUNTER — Telehealth: Payer: Self-pay | Admitting: Internal Medicine

## 2021-01-02 MED ORDER — HYDRALAZINE HCL 50 MG PO TABS: 75.0000 mg | ORAL_TABLET | Freq: Three times a day (TID) | ORAL | 3 refills | Status: DC

## 2021-01-02 MED ORDER — CLONIDINE HCL 0.1 MG PO TABS: 0.1000 mg | ORAL_TABLET | Freq: Three times a day (TID) | ORAL | 3 refills | Status: DC

## 2021-01-02 NOTE — Telephone Encounter (Signed)
Patient calling in  Patient has procedure scheduled for 12.23.22 & she says she is feeling really nervous about it  Wants to know if provider is willing to send in medication to help calm her nerves   Pharmacy  [id number] - [place], [place] Phone:  [phone]  Fax:  [phone]

## 2021-01-03 ENCOUNTER — Encounter: Payer: Self-pay | Admitting: Internal Medicine

## 2021-01-03 MED ORDER — ALPRAZOLAM 0.25 MG PO TABS: ORAL_TABLET | ORAL | 0 refills | Status: AC

## 2021-01-04 ENCOUNTER — Other Ambulatory Visit: Payer: Self-pay

## 2021-01-04 ENCOUNTER — Other Ambulatory Visit: Payer: Medicare PPO

## 2021-01-04 DIAGNOSIS — Z79899 Other long term (current) drug therapy: Secondary | ICD-10-CM

## 2021-01-04 DIAGNOSIS — I10 Essential (primary) hypertension: Secondary | ICD-10-CM

## 2021-01-04 DIAGNOSIS — E039 Hypothyroidism, unspecified: Secondary | ICD-10-CM | POA: Diagnosis not present

## 2021-01-05 ENCOUNTER — Telehealth: Payer: Self-pay | Admitting: Internal Medicine

## 2021-01-05 DIAGNOSIS — I10 Essential (primary) hypertension: Secondary | ICD-10-CM

## 2021-01-05 LAB — BASIC METABOLIC PANEL
BUN/Creatinine Ratio: 17 (ref 12–28)
BUN: 27 mg/dL (ref 8–27)
CO2: 21 mmol/L (ref 20–29)
Calcium: 10.4 mg/dL — ABNORMAL HIGH (ref 8.7–10.3)
Chloride: 101 mmol/L (ref 96–106)
Creatinine, Ser: 1.61 mg/dL — ABNORMAL HIGH (ref 0.57–1.00)
Glucose: 91 mg/dL (ref 70–99)
Potassium: 5.6 mmol/L — ABNORMAL HIGH (ref 3.5–5.2)
Sodium: 138 mmol/L (ref 134–144)
eGFR: 32 mL/min/{1.73_m2} — ABNORMAL LOW (ref >59)

## 2021-01-05 LAB — TSH: TSH: 5.44 u[IU]/mL — ABNORMAL HIGH (ref 0.450–4.500)

## 2021-01-05 NOTE — Telephone Encounter (Signed)
Left VM with patient re Labs WIll continue to follow  She called back with readings    BP are still up at times   1  Renal artery USN is ordered 2  Make sure she has appt in HTN clinic

## 2021-01-05 NOTE — Addendum Note (Signed)
Addended by: Thora Lance on: 01/05/2021 09:38 AM   Modules accepted: Orders

## 2021-01-05 NOTE — Telephone Encounter (Signed)
Order placed for HTN appt with PharmD and sent to [facility] for scheduling.

## 2021-01-09 ENCOUNTER — Ambulatory Visit: Payer: Medicare PPO | Admitting: Pharmacist

## 2021-01-09 ENCOUNTER — Other Ambulatory Visit: Payer: Self-pay

## 2021-01-09 VITALS — BP 178/72 | HR 55

## 2021-01-09 DIAGNOSIS — I10 Essential (primary) hypertension: Secondary | ICD-10-CM

## 2021-01-09 MED ORDER — HYDRALAZINE HCL 100 MG PO TABS: 100.0000 mg | ORAL_TABLET | Freq: Three times a day (TID) | ORAL | 3 refills | Status: DC

## 2021-01-09 NOTE — Progress Notes (Addendum)
Patient ID: [name]                 DOB: [birth year]                      MRN: [id number]     HPI: Amy [name] is a 85 y.o. female referred by Dr. Harrington [name] to HTN clinic. PMH is significant for aflutter s/p ablation, afib, and HTN. BP was 140/82 when pt was last seen in office in September. Pt called in mid November reporting labile BP 114/70 up to 230/88. Went to urgent care and was given clonidine rx to use as needed. Dr Amy [name] changed pt from losartan to losartan-HCTZ. She called in a week later, reported needing clonidine 0.1-0.2mg  daily secondary to SBP up to the 170s. Thought was that she was experiencing rebound HTN in just using clonidine once daily. She was changed to clonidine 0.1mg  BID. Spoke with Dr Amy [name] about 10 days later and was changed from losartan-HCTZ back to losartan but at higher dose of 50mg  BID. A week later, pt still reported elevated BP 191-478 systolic. Her clonidine was increased to TID and hydralazine was increased to 75mg  TID. She was scheduled for renal artery ultrasound (pending 12/23) and for appt in HTN clinic.  Pt presents today for follow up. Reports tolerating meds well. Keeps a very detailed log of home BP readings. Usually checking 8-12x a day with Amy [name] bicep cuff. SBP readings 140-160 in the morning around when she takes her meds, then improve to 105-130 a few hours later. BP consistently increases mid afternoon with SBP up to 180-207 around 3-5pm, then improve to 130-150s by night. Diastolic readings consistently well controlled. Mainly asymptomatic when her BP is elevated. Denies headache, blurred vision, dizziness. Her ears will get warm and sometimes she feels flushed in her face when BP is elevated. Reports slight LE edema in the past week, none noted today. Has renal artery ultrasound has been scheduled 12/23 at [facility]. Watches her salt intake, no caffeine or NSAIDs.  BP using home Amy [name] cuff: 175/82, then 163/78 Manual reading 178/72  States she has always  taken her Xarelto every other day and that Dr Amy [name] is aware. Feels cold when she takes it daily. Reports her husband bled on Eliquis.  Current HTN meds: atenolol 25mg  daily (AM), clonidine 0.1mg  TID (8:30-9am, 3pm, 11pm), diltiazem 240mg  daily (PM), hydralazine 75mg  TID (8:30-9am, 5-6pm, 11pm), losartan 50mg  BID  BP goal: <140/15mmHg due to labile readings and age  Family History: Colon cancer in her brother; Heart disease in her mother; Stroke (age of onset: 74) in her mother; Stroke (age of onset: 37) in her father.   Social History: Denies tobacco, alcohol and drug use.  Diet: Breakfast bar for breakfast. Sandwich for lunch. Vegetables and salads in the evening.  Wt Readings from Last 3 Encounters:  11/23/20 133 lb (60.3 kg)  10/12/20 133 lb 3.2 oz (60.4 kg)  04/11/20 135 lb (61.2 kg)   BP Readings from Last 3 Encounters:  11/23/20 130/74  10/12/20 140/82  04/11/20 (!) 146/60   Pulse Readings from Last 3 Encounters:  11/23/20 63  10/12/20 (!) 54  04/11/20 68    Renal function: CrCl cannot be calculated (Unknown ideal weight.).  Past Medical History:  Diagnosis Date   Anemia    Arthritis    Atrial fibrillation [facility])    Atrial flutter ([place])    s/p ablation   Atypical mole 09/07/2013   LOWER LEG  SEVERE TX= WIDER SHAVE    HTN (hypertension)    Hypothyroidism    Nodular basal cell carcinoma (BCC) 03/29/2020   Right Malar Cheek   Palpitations     Current Outpatient Medications on File Prior to Visit  Medication Sig Dispense Refill   acetaminophen (TYLENOL) 650 MG CR tablet Take 1,300 mg by mouth 2 (two) times daily as needed for pain.     alclomethasone (ACLOVATE) 0.05 % cream Apply topically at bedtime. 60 g 0   AMBULATORY NON FORMULARY MEDICATION CBD Oil 1 dose under tongue once daily     atenolol (TENORMIN) 25 MG tablet TAKE 1 TABLET BY MOUTH EVERY DAY 90 tablet 2   Biotin 5000 MCG CAPS Take by mouth.     Calcium Carbonate-Vitamin D (CALCIUM + D PO) 1 tab po qd      Cholecalciferol 100 MCG (4000 UT) CAPS Take 1 capsule (4,000 Units total) by mouth daily. 30 capsule    cloNIDine (CATAPRES) 0.1 MG tablet Take 1 tablet (0.1 mg total) by mouth 3 (three) times daily. 270 tablet 3   diclofenac sodium (VOLTAREN) 1 % GEL Apply 2 g topically 4 (four) times daily. To affected joint. 300 g 11   diltiazem (CARDIZEM CD) 240 MG 24 hr capsule TAKE 1 CAPSULE BY MOUTH EVERY DAY 90 capsule 3   dorzolamide-timolol (COSOPT) 22.3-6.8 MG/ML ophthalmic solution Place 1 drop into both eyes 2 (two) times daily.     flecainide (TAMBOCOR) 50 MG tablet TAKE 1 TABLET BY MOUTH TWICE A DAY 180 tablet 3   hydrALAZINE (APRESOLINE) 50 MG tablet Take 1.5 tablets (75 mg total) by mouth 3 (three) times daily. 405 tablet 3   levothyroxine (SYNTHROID) 50 MCG tablet TAKE 1 TABLET BY MOUTH EVERY DAY 90 tablet 3   linaclotide (LINZESS) 72 MCG capsule Take 1 capsule (72 mcg total) by mouth daily. 30 capsule 11   losartan (COZAAR) 50 MG tablet Take 1 tablet (50 mg total) by mouth 2 (two) times daily. 180 tablet 3   XARELTO 15 MG TABS tablet TAKE 1 TABLET BY MOUTH EVERY DAY WITH SUPPER 30 tablet 5   No current facility-administered medications on file prior to visit.    No Known Allergies   Assessment/Plan:  1. Hypertension - BP remains elevated above goal <140/56mmHg, readings continue to fluctuate widely during the day from 829-562 systolic a few hours after taking her morning meds to 170-200 by mid-late afternoon. Will increase hydralazine from 75mg  TID to 100mg  TID and move up 2nd dose to 3pm when she takes her clonidine to hopefully prevent higher readings around that time. Rechecking BMET today as K was 5.6 last week and I do not see that any medication adjustments were made. For now, she will continue losartan 50mg  BID, atenolol 25mg  daily, diltiazem 240mg  daily, clonidine 0.1mg  TID. Can consider adding low dose chlorthalidone if K remains elevated on labs from today. Pt will continue to  monitor BP closely at home and I'll follow up with her in a few weeks to see how home readings are looking.  Pt states she has been taking her Xarelto every other day for a long time because she feels cold when she takes it daily. Advised pt that Xarelto needs to be taken daily for full anticoagulant benefit and stroke protection. Will forward to Dr Amy [name] per pt request.  Harlon Flor. Dhara Schepp, PharmD, BCACP, [place] [address] [place], [place] [address] Phone: [phone]; Fax: [phone]  01/09/2021 2:01 PM

## 2021-01-09 NOTE — Patient Instructions (Addendum)
Your blood pressure goal is < 140/78mmHg  Increase your hydralazine to 100mg  - 1 tablet 3x a day of the new prescription. You can use up the rest of your 50mg  tablets and take 2 tablets 3x a day. Take your second dose a bit earlier around 3pm when you take your second clonidine dose.  Continue taking your other medications  We'll recheck your labs today since your potassium was high last week  I'll message Dr Harrington Challenger about the frequency of your Xarelto dosing. This medication is typically taken once daily

## 2021-01-10 ENCOUNTER — Telehealth: Payer: Self-pay | Admitting: Pharmacist

## 2021-01-10 ENCOUNTER — Telehealth: Payer: Self-pay

## 2021-01-10 DIAGNOSIS — N289 Disorder of kidney and ureter, unspecified: Secondary | ICD-10-CM

## 2021-01-10 DIAGNOSIS — Z79899 Other long term (current) drug therapy: Secondary | ICD-10-CM

## 2021-01-10 LAB — BASIC METABOLIC PANEL
BUN/Creatinine Ratio: 19 (ref 12–28)
BUN: 29 mg/dL — ABNORMAL HIGH (ref 8–27)
CO2: 18 mmol/L — ABNORMAL LOW (ref 20–29)
Calcium: 9.9 mg/dL (ref 8.7–10.3)
Chloride: 106 mmol/L (ref 96–106)
Creatinine, Ser: 1.49 mg/dL — ABNORMAL HIGH (ref 0.57–1.00)
Glucose: 92 mg/dL (ref 70–99)
Potassium: 5 mmol/L (ref 3.5–5.2)
Sodium: 142 mmol/L (ref 134–144)
eGFR: 35 mL/min/{1.73_m2} — ABNORMAL LOW (ref >59)

## 2021-01-10 NOTE — Telephone Encounter (Signed)
Order placed for pt to be established with [facility].

## 2021-01-10 NOTE — Telephone Encounter (Signed)
Spoke with pt. Dr Harrington Challenger has reviewed BMET results with pt. K improved from 5.6 to 5, SCr stable ~1.5. Will call pt in 2-3 weeks to follow up with home BP readings on higher hydralazine dose. If needed, can add low dose chlorthalidone.

## 2021-01-10 NOTE — Telephone Encounter (Signed)
-----   Message from Dorris Carnes V, MD sent at 01/10/2021  8:54 AM EST ----- Potassium is in normal range 5 Cr is a little better 1.49   Spoke to patient about results I would recomm she have a referral to [facility] to establish   Please put one in.

## 2021-01-11 ENCOUNTER — Encounter: Payer: Self-pay | Admitting: Internal Medicine

## 2021-01-12 ENCOUNTER — Ambulatory Visit (HOSPITAL_COMMUNITY)
Admission: RE | Admit: 2021-01-12 | Discharge: 2021-01-12 | Disposition: A | Discharge status: Home / Self Care | Payer: Medicare PPO | Source: Ambulatory Visit | Attending: Internal Medicine | Admitting: Internal Medicine

## 2021-01-12 ENCOUNTER — Telehealth: Payer: Self-pay

## 2021-01-12 ENCOUNTER — Other Ambulatory Visit: Payer: Self-pay

## 2021-01-12 DIAGNOSIS — Z79899 Other long term (current) drug therapy: Secondary | ICD-10-CM

## 2021-01-12 DIAGNOSIS — I10 Essential (primary) hypertension: Secondary | ICD-10-CM

## 2021-01-12 DIAGNOSIS — E039 Hypothyroidism, unspecified: Secondary | ICD-10-CM

## 2021-01-12 DIAGNOSIS — N289 Disorder of kidney and ureter, unspecified: Secondary | ICD-10-CM

## 2021-01-12 NOTE — Telephone Encounter (Signed)
-----   Message from Fay Records, MD sent at 01/12/2021 11:31 AM EST ----- Spoke to patient about test results    I have also spoken to Pierre Bali   She needs an renal angiogram PLease put in referral for expedited appt with him    Patient aware of plans/recs I also told her she should be taking anticoagulant as prescribed

## 2021-01-12 NOTE — Telephone Encounter (Signed)
Expedited referral placed for Dr. Trula Slade per Dr. Harrington Challenger.

## 2021-01-23 ENCOUNTER — Encounter: Payer: Self-pay | Admitting: Internal Medicine

## 2021-01-24 ENCOUNTER — Encounter: Payer: Self-pay | Admitting: Internal Medicine

## 2021-01-24 ENCOUNTER — Encounter: Payer: Self-pay | Admitting: Pharmacist

## 2021-01-25 NOTE — Telephone Encounter (Signed)
Called pt and she reports that she is having mild ankle edema... mostly in the evening but relieved with elevation.. she is watching her sodium. She does not think that she is urinating much during the day. She urinates mostly at night.   Her BP has been doing well: 145/65, 135/59, 137/65,143/65.   She denies any SOB and no dizziness. I made her an appt with Dr. Harrington Challenger 01/26/21.   She is not seeing Dr. Trula Slade until 02/12/21. She will bring a list of her BP readings to her appt tomorrow.

## 2021-01-26 ENCOUNTER — Other Ambulatory Visit: Payer: Self-pay

## 2021-01-26 ENCOUNTER — Ambulatory Visit: Payer: Medicare PPO | Admitting: Internal Medicine

## 2021-01-26 ENCOUNTER — Encounter: Payer: Self-pay | Admitting: Internal Medicine

## 2021-01-26 VITALS — BP 134/60 | HR 60 | Ht 67.0 in | Wt 132.6 lb

## 2021-01-26 DIAGNOSIS — Z79899 Other long term (current) drug therapy: Secondary | ICD-10-CM

## 2021-01-26 DIAGNOSIS — I10 Essential (primary) hypertension: Secondary | ICD-10-CM | POA: Diagnosis not present

## 2021-01-26 DIAGNOSIS — N289 Disorder of kidney and ureter, unspecified: Secondary | ICD-10-CM

## 2021-01-26 LAB — CBC
Hematocrit: 30.3 % — ABNORMAL LOW (ref 34.0–46.6)
Hemoglobin: 10.3 g/dL — ABNORMAL LOW (ref 11.1–15.9)
MCH: 32.7 pg (ref 26.6–33.0)
MCHC: 34 g/dL (ref 31.5–35.7)
MCV: 96 fL (ref 79–97)
Platelets: 290 10*3/uL (ref 150–450)
RBC: 3.15 x10E6/uL — ABNORMAL LOW (ref 3.77–5.28)
RDW: 12 % (ref 11.7–15.4)
WBC: 7.1 10*3/uL (ref 3.4–10.8)

## 2021-01-26 LAB — BASIC METABOLIC PANEL
BUN/Creatinine Ratio: 18 (ref 12–28)
BUN: 27 mg/dL (ref 8–27)
CO2: 22 mmol/L (ref 20–29)
Calcium: 10 mg/dL (ref 8.7–10.3)
Chloride: 103 mmol/L (ref 96–106)
Creatinine, Ser: 1.54 mg/dL — ABNORMAL HIGH (ref 0.57–1.00)
Glucose: 75 mg/dL (ref 70–99)
Potassium: 4.7 mmol/L (ref 3.5–5.2)
Sodium: 139 mmol/L (ref 134–144)
eGFR: 33 mL/min/{1.73_m2} — ABNORMAL LOW (ref >59)

## 2021-01-26 NOTE — Patient Instructions (Signed)
Medication Instructions:  Your physician recommends that you continue on your current medications as directed. Please refer to the Current Medication list given to you today.  *If you need a refill on your cardiac medications before your next appointment, please call your pharmacy*   Lab Work: BMET and CBC  If you have labs (blood work) drawn today and your tests are completely normal, you will receive your results only by: [facility] (if you have MyChart) OR A paper copy in the mail If you have any lab test that is abnormal or we need to change your treatment, we will call you to review the results.   Testing/Procedures: none   Follow-Up: At [facility], you and your health needs are our priority.  As part of our continuing mission to provide you with exceptional heart care, we have created designated Provider Care Teams.  These Care Teams include your primary Cardiologist (physician) and Advanced Practice Providers (APPs -  Physician Assistants and Nurse Practitioners) who all work together to provide you with the care you need, when you need it.  We recommend signing up for the patient portal called "MyChart".  Sign up information is provided on this After Visit Summary.  MyChart is used to connect with patients for Virtual Visits (Telemedicine).  Patients are able to view lab/test results, encounter notes, upcoming appointments, etc.  Non-urgent messages can be sent to your provider as well.   To learn more about what you can do with MyChart, go to [email or web address].

## 2021-01-26 NOTE — Progress Notes (Signed)
Cardiology Office Note   Date:  01/26/2021   ID:  Amy [name], DOB 1935-11-07, MRN [id number]  PCP:  Binnie Rail, MD  Cardiologist:   Dorris Carnes, MD    Pt presents for eval of swelling     History of Present Illness: Amy [name] is a 86 y.o. female with a history of atrial flutter (s/p ablation), PAF and HTN   I saw the pt in September 2022     At that time her BP was fair    She called in in early December   BP HIGH   198/   Over the next several weeks her meds were increased    She was also set up for a RA USN   This showed probable occlusion of R Renal artery Pt has an appt in [facility] with W Brabham in a couple weeks     Since the first of Jan the pt says her bp has been running better      130s to 160 (usually mid day)   No real high values She says her breathing is OK   No CP  No dizzienss   No palpitations   She called in and said she had some ankle swelling   Appt made for today   She says it gets worse as day goes on.     Outpatient Medications Prior to Visit  Medication Sig Dispense Refill   acetaminophen (TYLENOL) 650 MG CR tablet Take 1,300 mg by mouth 2 (two) times daily as needed for pain.     AMBULATORY NON FORMULARY MEDICATION CBD Oil 1 dose under tongue once daily     atenolol (TENORMIN) 25 MG tablet TAKE 1 TABLET BY MOUTH EVERY DAY 90 tablet 2   Biotin 5000 MCG CAPS Take by mouth.     Calcium Carbonate-Vitamin D (CALCIUM + D PO) 1 tab po qd     Cholecalciferol 100 MCG (4000 UT) CAPS Take 1 capsule (4,000 Units total) by mouth daily. 30 capsule    cloNIDine (CATAPRES) 0.1 MG tablet Take 1 tablet (0.1 mg total) by mouth 3 (three) times daily. 270 tablet 3   diltiazem (CARDIZEM CD) 240 MG 24 hr capsule TAKE 1 CAPSULE BY MOUTH EVERY DAY 90 capsule 3   dorzolamide-timolol (COSOPT) 22.3-6.8 MG/ML ophthalmic solution Place 1 drop into both eyes 2 (two) times daily.     flecainide (TAMBOCOR) 50 MG tablet TAKE 1 TABLET BY MOUTH TWICE A DAY 180 tablet 3    hydrALAZINE (APRESOLINE) 100 MG tablet Take 1 tablet (100 mg total) by mouth 3 (three) times daily. 270 tablet 3   levothyroxine (SYNTHROID) 50 MCG tablet TAKE 1 TABLET BY MOUTH EVERY DAY 90 tablet 3   linaclotide (LINZESS) 72 MCG capsule Take 1 capsule (72 mcg total) by mouth daily. 30 capsule 11   losartan (COZAAR) 50 MG tablet Take 1 tablet (50 mg total) by mouth 2 (two) times daily. 180 tablet 3   XARELTO 15 MG TABS tablet TAKE 1 TABLET BY MOUTH EVERY DAY WITH SUPPER 30 tablet 5   alclomethasone (ACLOVATE) 0.05 % cream Apply topically at bedtime. (Patient not taking: Reported on 01/26/2021) 60 g 0   diclofenac sodium (VOLTAREN) 1 % GEL Apply 2 g topically 4 (four) times daily. To affected joint. (Patient not taking: Reported on 01/26/2021) 300 g 11   No facility-administered medications prior to visit.     Allergies:   Patient has no known allergies.   Past  Medical History:  Diagnosis Date   Anemia    Arthritis    Atrial fibrillation (HCC)    Atrial flutter ([place])    s/p ablation   Atypical mole 09/07/2013   LOWER LEG SEVERE TX= WIDER SHAVE    HTN (hypertension)    Hypothyroidism    Nodular basal cell carcinoma (BCC) 03/29/2020   Right Malar Cheek   Palpitations     Past Surgical History:  Procedure Laterality Date   A FLUTTER ABLATION     KNEE ARTHROSCOPY Right    TUBAL LIGATION       Social History:  The patient  reports that she has never smoked. She has never used smokeless tobacco. She reports that she does not drink alcohol and does not use drugs.   Family History:  The patient's family history includes Colon cancer in her brother; Heart disease in her mother; Stroke (age of onset: 68) in her mother; Stroke (age of onset: 49) in her father.    ROS:  Please see the history of present illness. All other systems are reviewed and  Negative to the above problem except as noted.    PHYSICAL EXAM: VS:  BP 134/60    Pulse 60    Ht 5\' 7"  (1.702 m)    Wt 132 lb 9.6 oz (60.1  kg)    LMP  (LMP Unknown)    BMI 20.77 kg/m      GEN: Thin 86 yo who is in NAD  HEENT: normal  Neck: JVP is normal   No  carotid bruits  Cardiac: RRR; no murmurs  Triv RLE  edema  Respiratory:  clear to auscultation  GII: soft, nontender, nondistended, + BS  No hepatomegaly  MS: no deformity Moving all extremities   Skin: warm and dry, no rash Psych: euthymic mood, full affect   EKG:  EKG is not   ordered     RA USN  Summary: Renal: Right: Abnormal right Resistive Index. Normal cortical thickness of right kidney. Probable occlusion of the right renal artery. Left: Abnormal left Resisitve Index. Normal cortical thickness of the left kidney. Normal size of left kidney. No evidence of left renal artery stenosis. LRV flow present. Mesenteric: Normal Celiac artery findings. 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   Lipid Panel    Component Value Date/Time   CHOL 219 (H) 10/12/2020 1617   TRIG 85 10/12/2020 1617   HDL 95 10/12/2020 1617   CHOLHDL 2.3 10/12/2020 1617   CHOLHDL 2.0 06/05/2015 0946   VLDL 10 06/05/2015 0946   LDLCALC 109 (H) 10/12/2020 1617   LDLDIRECT 84.9 11/15/2008 0924      Wt Readings from Last 3 Encounters:  01/26/21 132 lb 9.6 oz (60.1 kg)  11/23/20 133 lb (60.3 kg)  10/12/20 133 lb 3.2 oz (60.4 kg)      ASSESSMENT AND PLAN:  1  Edema   No  significant LE edema    Overall volume status looks good    2 HTN    Pt with hx of HTN that became very uncontrlled in Dec   Required signifiant amplification of meds    RA USN showed near occluasion of R renal artery  Ques if cause for spike in BP    Pt has appt to see W Brabham this month      Will get BMET today    Keep on same meds for now   BP is not optimal, but much better than previous  3  PAF Denies palpitations  Continue atenolol and dilt and flecanide   I would recomm Xarelto daily   She says it makes her cold    Told her concerns for embolic events    She says she has not felt  any palpitation    Will check CBC and BMET    4 PAD  Pt with mesenteric stenosis noted     Asymptomatic     Need tighter lipid control  5  Lipids  LDL 109  HDL 95   Needs to srart statin      Current medicines are reviewed at length with the patient today.  The patient does not have concerns regarding medicines.  Signed, Dorris Carnes, MD  01/26/2021 10:51 AM    [address], [place], [place]  [address] Phone: [phone]; Fax: [phone]

## 2021-01-29 ENCOUNTER — Telehealth: Payer: Self-pay

## 2021-01-29 DIAGNOSIS — D649 Anemia, unspecified: Secondary | ICD-10-CM

## 2021-01-29 DIAGNOSIS — Z79899 Other long term (current) drug therapy: Secondary | ICD-10-CM

## 2021-01-29 NOTE — Telephone Encounter (Addendum)
Pt advised and will return 03/02/21 for anemia panel.   Pt reports BP has been fluctuating... she has been checking her BP first thing in the morning and just before each of her doses throughout the day.  I have asked her to check her BP about 2 hours after taking her meds to see then what it is running..   BP readings pre meds... 163/68, 184/79 prior to her afternoon meds, 173/68 pre, pm meds.   This morning pre meds: 164/69.    Pt will recheck her BP today about 1 pm and I will call her for the reading.

## 2021-01-29 NOTE — Telephone Encounter (Signed)
Patient could make a switch of meds  Stop diltiazem     Increase atenolol to 50 daily  Add amlodipine 5 mg daily  Will be able to increase to 10

## 2021-01-29 NOTE — Telephone Encounter (Signed)
Called the pt to check on her BP today at 1 pm it was 151/68.   She takes her meds at 9 am when she gets up, 3-4 pm, and her bedtime 11 pm.   She says she is anxious waiting on her BP with Dr. Trula Slade... I called his office [phone]) and they will check with Dr. Trula Slade to see if he can work her in sooner and call me back.

## 2021-01-29 NOTE — Telephone Encounter (Signed)
-----   Message from Dorris Carnes V, MD sent at 01/27/2021 11:13 PM EST ----- Electrolytes are OK Kidney function is stable Hgb is a little low   I would repeat in 4 weeks with anemia panel

## 2021-01-29 NOTE — Telephone Encounter (Signed)
Dr. Stephens Shire office can move her appt up a week to 02/05/21 at 11am and the pt agrees... will forward to Dr. Harrington Challenger to review to see if any med changes needed prior to her appt.

## 2021-01-30 ENCOUNTER — Encounter: Payer: Self-pay | Admitting: Internal Medicine

## 2021-01-31 MED ORDER — AMLODIPINE BESYLATE 5 MG PO TABS: 5.0000 mg | ORAL_TABLET | Freq: Every day | ORAL | 3 refills | Status: DC

## 2021-01-31 MED ORDER — ATENOLOL 50 MG PO TABS: 50.0000 mg | ORAL_TABLET | Freq: Every day | ORAL | 3 refills | Status: DC

## 2021-01-31 NOTE — Telephone Encounter (Signed)
Pt advised Dr. Harrington Challenger' recommendations and verbalized understanding.

## 2021-01-31 NOTE — Addendum Note (Signed)
Addended by: Stephani Police on: 01/31/2021 05:03 PM   Modules accepted: Orders

## 2021-02-01 ENCOUNTER — Telehealth: Payer: Self-pay | Admitting: Internal Medicine

## 2021-02-01 NOTE — Telephone Encounter (Signed)
Called pt    She has made switch to amlodipine   Stopped diltiazem Increased atenolol  BP today is much better   110s to 140s   /    She feels OK  Has appt with Pierre Bali on Monday

## 2021-02-05 ENCOUNTER — Encounter: Payer: Self-pay | Admitting: Surgery

## 2021-02-05 ENCOUNTER — Other Ambulatory Visit: Payer: Self-pay

## 2021-02-05 ENCOUNTER — Ambulatory Visit: Payer: Medicare PPO | Admitting: Surgery

## 2021-02-05 VITALS — BP 176/75 | HR 60 | Temp 97.9°F | Resp 20 | Ht 67.0 in | Wt 133.0 lb

## 2021-02-05 DIAGNOSIS — I701 Atherosclerosis of renal artery: Secondary | ICD-10-CM | POA: Diagnosis not present

## 2021-02-05 NOTE — Progress Notes (Signed)
[facility] [place]  Patient name: Amy [name] MRN: [id number] DOB: 1935-11-09 Sex: female   REQUESTING PROVIDER:    Dr. Harrington Challenger   REASON FOR CONSULT:    Renal artery stenosis / occlusion  HISTORY OF PRESENT ILLNESS:   Amy [name] is a 85 y.o. female, who is referred for evaluation of hypertension in the setting of renal artery disease.  She is on multiple medications, however her blood pressure remains elevated.  She had an abnormal renal artery Doppler study performed.  She is here for further discussions.  The patient has a history of atrial flutter (status post ablation).  She also is on Xarelto for atrial fibrillation.  She is on multiple medications for blood pressure.  She is a non-smoker.  PAST MEDICAL HISTORY    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   Right Malar Cheek   Palpitations      FAMILY HISTORY   Family History  Problem Relation Age of Onset   Stroke Father 45       died from stroke at age 75   Stroke Mother 93       died from stroke at age 30   Heart disease Mother    Colon cancer Brother     SOCIAL HISTORY:   Social History   Socioeconomic History   Marital status: Married    Spouse name: Not on file   Number of children: 1   Years of education: Not on file   Highest education level: Not on file  Occupational History   Occupation: Retired  Tobacco Use   Smoking status: Never   Smokeless tobacco: Never  Vaping Use   Vaping Use: Never used  Substance and Sexual Activity   Alcohol use: No   Drug use: No   Sexual activity: Not on file  Other Topics Concern   Not on file  Social History Narrative   Occ caffeine    Social  Determinants of Health   Financial Resource Strain: Not on file  Food Insecurity: Not on file  Transportation Needs: Not on file  Physical Activity: Not on file  Stress: Not on file  Social Connections: Not on file  Intimate Partner Violence: Not on file    ALLERGIES:    No Known Allergies  CURRENT MEDICATIONS:    Current Outpatient Medications  Medication Sig Dispense Refill   acetaminophen (TYLENOL) 650 MG CR tablet Take 1,300 mg by mouth 2 (two) times daily as needed for pain.     alclomethasone (ACLOVATE) 0.05 % cream Apply topically at bedtime. 60 g 0   AMBULATORY NON FORMULARY MEDICATION CBD Oil 1 dose under tongue once daily     amLODipine (NORVASC) 5 MG tablet Take 1 tablet (5 mg total) by mouth daily. 90 tablet 3   atenolol (TENORMIN) 50 MG tablet Take 1 tablet (50 mg total) by mouth daily. 90 tablet 3   Biotin 5000 MCG CAPS Take by mouth.     Calcium Carbonate-Vitamin D (CALCIUM + D PO) 1 tab po qd     Cholecalciferol 100 MCG (4000 UT) CAPS Take 1 capsule (4,000 Units total) by mouth daily. [place]  capsule    cloNIDine (CATAPRES) 0.1 MG tablet Take 1 tablet (0.1 mg total) by mouth 3 (three) times daily. 270 tablet 3   dorzolamide-timolol (COSOPT) 22.3-6.8 MG/ML ophthalmic solution Place 1 drop into both eyes 2 (two) times daily.     flecainide (TAMBOCOR) 50 MG tablet TAKE 1 TABLET BY MOUTH TWICE A DAY 180 tablet 3   hydrALAZINE (APRESOLINE) 100 MG tablet Take 1 tablet (100 mg total) by mouth 3 (three) times daily. 270 tablet 3   levothyroxine (SYNTHROID) 50 MCG tablet TAKE 1 TABLET BY MOUTH EVERY DAY 90 tablet 3   linaclotide (LINZESS) 72 MCG capsule Take 1 capsule (72 mcg total) by mouth daily. 30 capsule 11   losartan (COZAAR) 50 MG tablet Take 1 tablet (50 mg total) by mouth 2 (two) times daily. 180 tablet 3   XARELTO 15 MG TABS tablet TAKE 1 TABLET BY MOUTH EVERY DAY WITH SUPPER 30 tablet 5   diclofenac sodium (VOLTAREN) 1 % GEL Apply 2 g topically 4 (four) times daily.  To affected joint. (Patient not taking: Reported on 01/26/2021) 300 g 11   No current facility-administered medications for this visit.    REVIEW OF SYSTEMS:   [X]  denotes positive finding, [ ]  denotes negative finding Cardiac  Comments:  Chest pain or chest pressure:    Shortness of breath upon exertion:    Short of breath when lying flat:    Irregular heart rhythm:        Vascular    Pain in calf, thigh, or hip brought on by ambulation:    Pain in feet at night that wakes you up from your sleep:     Blood clot in your veins:    Leg swelling:         Pulmonary    Oxygen at home:    Productive cough:     Wheezing:         Neurologic    Sudden weakness in arms or legs:     Sudden numbness in arms or legs:     Sudden onset of difficulty speaking or slurred speech:    Temporary loss of vision in one eye:     Problems with dizziness:         Gastrointestinal    Blood in stool:      Vomited blood:         Genitourinary    Burning when urinating:     Blood in urine:        Psychiatric    Major depression:         Hematologic    Bleeding problems:    Problems with blood clotting too easily:        Skin    Rashes or ulcers:        Constitutional    Fever or chills:     PHYSICAL EXAM:   Vitals:   02/05/21 1111  BP: (!) 176/75  Pulse: 60  Resp: 20  Temp: 97.9 F (36.6 C)  SpO2: 96%  Weight: 133 lb (60.3 kg)  Height: 5\' 7"  (1.702 m)    GENERAL: The patient is a well-nourished female, in no acute distress. The vital signs are documented above. CARDIAC: There is a regular rate and rhythm.  VASCULAR: Palpable bilateral dorsalis pedis and radial pulses PULMONARY: Nonlabored respirations ABDOMEN: Soft and non-tender.  No pulsatile mass MUSCULOSKELETAL: There are no major deformities or cyanosis. NEUROLOGIC: No focal weakness or paresthesias are detected. SKIN: There are no  ulcers or rashes noted. PSYCHIATRIC: The patient has a normal affect.  STUDIES:   I  have reviewed the following duplex: Right: Abnormal right Resistive Index. Normal cortical thickness of         right kidney.         Probable occlusion of the right renal artery.  Left:  Abnormal left Resisitve Index. Normal cortical thickness of         the left kidney. Normal size of left kidney. No evidence of         left renal artery stenosis. LRV flow present.    +------------+--------+--------+----+-----------+--------+--------+----+   Right Kidney PSV cm/s EDV cm/s RI   Left Kidney PSV cm/s EDV cm/s RI     +------------+--------+--------+----+-----------+--------+--------+----+   Upper Pole   21       0        0.99 Upper Pole  26       7        0.74   +------------+--------+--------+----+-----------+--------+--------+----+   Mid          18       0        0.98 Mid         37       7        0.82   +------------+--------+--------+----+-----------+--------+--------+----+   Lower Pole   25       1        0.98 Lower Pole  33       7        0.80   +------------+--------+--------+----+-----------+--------+--------+----+   Hilar                               Hilar       66       14       0.79   +------------+--------+--------+----+-----------+--------+--------+----+   ASSESSMENT and PLAN   Hypertension: I reviewed the ultrasound with the patient.  It is unclear to me whether or not the right renal artery is 90% occluded or not.  Her kidneys both are the same size.  I think the next order of business is to determine her anatomy.  I would like to start with angiography and see whether or not her right renal artery is occluded or if there is a high-grade stenosis or if there are accessory vessels keep in the kidney perfused.  If there is a lesion amenable to stenting I would do so.  If the artery is occluded, the neck step will be to try and determine whether this is a functioning kidney or not and whether or not it needs to be removed to help her with blood pressure control.  I am doing her  angiogram on Tuesday, January 24.  We will stop her Xarelto prior to her procedure.   Leia Alf, MD, FACS [facility] [phone] Pager [phone]

## 2021-02-05 NOTE — H&P (View-Only) (Signed)
[facility] [place]  Patient name: Amy [name] MRN: [id number] DOB: 04-22-1935 Sex: female   REQUESTING PROVIDER:    Dr. Harrington Challenger   REASON FOR CONSULT:    Renal artery stenosis / occlusion  HISTORY OF PRESENT ILLNESS:   Amy [name] is a 86 y.o. female, who is referred for evaluation of hypertension in the setting of renal artery disease.  She is on multiple medications, however her blood pressure remains elevated.  She had an abnormal renal artery Doppler study performed.  She is here for further discussions.  The patient has a history of atrial flutter (status post ablation).  She also is on Xarelto for atrial fibrillation.  She is on multiple medications for blood pressure.  She is a non-smoker.  PAST MEDICAL HISTORY    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   Right Malar Cheek   Palpitations      FAMILY HISTORY   Family History  Problem Relation Age of Onset   Stroke Father 65       died from stroke at age 62   Stroke Mother 84       died from stroke at age 36   Heart disease Mother    Colon cancer Brother     SOCIAL HISTORY:   Social History   Socioeconomic History   Marital status: Married    Spouse name: Not on file   Number of children: 1   Years of education: Not on file   Highest education level: Not on file  Occupational History   Occupation: Retired  Tobacco Use   Smoking status: Never   Smokeless tobacco: Never  Vaping Use   Vaping Use: Never used  Substance and Sexual Activity   Alcohol use: No   Drug use: No   Sexual activity: Not on file  Other Topics Concern   Not on file  Social History Narrative   Occ caffeine    Social  Determinants of Health   Financial Resource Strain: Not on file  Food Insecurity: Not on file  Transportation Needs: Not on file  Physical Activity: Not on file  Stress: Not on file  Social Connections: Not on file  Intimate Partner Violence: Not on file    ALLERGIES:    No Known Allergies  CURRENT MEDICATIONS:    Current Outpatient Medications  Medication Sig Dispense Refill   acetaminophen (TYLENOL) 650 MG CR tablet Take 1,300 mg by mouth 2 (two) times daily as needed for pain.     alclomethasone (ACLOVATE) 0.05 % cream Apply topically at bedtime. 60 g 0   AMBULATORY NON FORMULARY MEDICATION CBD Oil 1 dose under tongue once daily     amLODipine (NORVASC) 5 MG tablet Take 1 tablet (5 mg total) by mouth daily. 90 tablet 3   atenolol (TENORMIN) 50 MG tablet Take 1 tablet (50 mg total) by mouth daily. 90 tablet 3   Biotin 5000 MCG CAPS Take by mouth.     Calcium Carbonate-Vitamin D (CALCIUM + D PO) 1 tab po qd     Cholecalciferol 100 MCG (4000 UT) CAPS Take 1 capsule (4,000 Units total) by mouth daily. [place]  capsule    cloNIDine (CATAPRES) 0.1 MG tablet Take 1 tablet (0.1 mg total) by mouth 3 (three) times daily. 270 tablet 3   dorzolamide-timolol (COSOPT) 22.3-6.8 MG/ML ophthalmic solution Place 1 drop into both eyes 2 (two) times daily.     flecainide (TAMBOCOR) 50 MG tablet TAKE 1 TABLET BY MOUTH TWICE A DAY 180 tablet 3   hydrALAZINE (APRESOLINE) 100 MG tablet Take 1 tablet (100 mg total) by mouth 3 (three) times daily. 270 tablet 3   levothyroxine (SYNTHROID) 50 MCG tablet TAKE 1 TABLET BY MOUTH EVERY DAY 90 tablet 3   linaclotide (LINZESS) 72 MCG capsule Take 1 capsule (72 mcg total) by mouth daily. 30 capsule 11   losartan (COZAAR) 50 MG tablet Take 1 tablet (50 mg total) by mouth 2 (two) times daily. 180 tablet 3   XARELTO 15 MG TABS tablet TAKE 1 TABLET BY MOUTH EVERY DAY WITH SUPPER 30 tablet 5   diclofenac sodium (VOLTAREN) 1 % GEL Apply 2 g topically 4 (four) times daily.  To affected joint. (Patient not taking: Reported on 01/26/2021) 300 g 11   No current facility-administered medications for this visit.    REVIEW OF SYSTEMS:   [X]  denotes positive finding, [ ]  denotes negative finding Cardiac  Comments:  Chest pain or chest pressure:    Shortness of breath upon exertion:    Short of breath when lying flat:    Irregular heart rhythm:        Vascular    Pain in calf, thigh, or hip brought on by ambulation:    Pain in feet at night that wakes you up from your sleep:     Blood clot in your veins:    Leg swelling:         Pulmonary    Oxygen at home:    Productive cough:     Wheezing:         Neurologic    Sudden weakness in arms or legs:     Sudden numbness in arms or legs:     Sudden onset of difficulty speaking or slurred speech:    Temporary loss of vision in one eye:     Problems with dizziness:         Gastrointestinal    Blood in stool:      Vomited blood:         Genitourinary    Burning when urinating:     Blood in urine:        Psychiatric    Major depression:         Hematologic    Bleeding problems:    Problems with blood clotting too easily:        Skin    Rashes or ulcers:        Constitutional    Fever or chills:     PHYSICAL EXAM:   Vitals:   02/05/21 1111  BP: (!) 176/75  Pulse: 60  Resp: 20  Temp: 97.9 F (36.6 C)  SpO2: 96%  Weight: 133 lb (60.3 kg)  Height: 5\' 7"  (1.702 m)    GENERAL: The patient is a well-nourished female, in no acute distress. The vital signs are documented above. CARDIAC: There is a regular rate and rhythm.  VASCULAR: Palpable bilateral dorsalis pedis and radial pulses PULMONARY: Nonlabored respirations ABDOMEN: Soft and non-tender.  No pulsatile mass MUSCULOSKELETAL: There are no major deformities or cyanosis. NEUROLOGIC: No focal weakness or paresthesias are detected. SKIN: There are no  ulcers or rashes noted. PSYCHIATRIC: The patient has a normal affect.  STUDIES:   I  have reviewed the following duplex: Right: Abnormal right Resistive Index. Normal cortical thickness of         right kidney.         Probable occlusion of the right renal artery.  Left:  Abnormal left Resisitve Index. Normal cortical thickness of         the left kidney. Normal size of left kidney. No evidence of         left renal artery stenosis. LRV flow present.    +------------+--------+--------+----+-----------+--------+--------+----+   Right Kidney PSV cm/s EDV cm/s RI   Left Kidney PSV cm/s EDV cm/s RI     +------------+--------+--------+----+-----------+--------+--------+----+   Upper Pole   21       0        0.99 Upper [place]  26       7        0.74   +------------+--------+--------+----+-----------+--------+--------+----+   Mid          18       0        0.98 Mid         37       7        0.82   +------------+--------+--------+----+-----------+--------+--------+----+   Lower Pole   25       1        0.98 Lower [place]  33       7        0.80   +------------+--------+--------+----+-----------+--------+--------+----+   Hilar                               Hilar       66       14       0.79   +------------+--------+--------+----+-----------+--------+--------+----+   ASSESSMENT and PLAN   Hypertension: I reviewed the ultrasound with the patient.  It is unclear to me whether or not the right renal artery is 90% occluded or not.  Her kidneys both are the same size.  I think the next order of business is to determine her anatomy.  I would like to start with angiography and see whether or not her right renal artery is occluded or if there is a high-grade stenosis or if there are accessory vessels keep in the kidney perfused.  If there is a lesion amenable to stenting I would do so.  If the artery is occluded, the neck step will be to try and determine whether this is a functioning kidney or not and whether or not it needs to be removed to help her with blood pressure control.  I am doing her  angiogram on Tuesday, January 24.  We will stop her Xarelto prior to her procedure.   Leia Alf, MD, FACS [facility] [phone] Pager [phone]

## 2021-02-12 ENCOUNTER — Encounter: Payer: Medicare PPO | Admitting: Surgery

## 2021-02-13 ENCOUNTER — Ambulatory Visit (HOSPITAL_COMMUNITY)
Admission: RE | Admit: 2021-02-13 | Discharge: 2021-02-13 | Disposition: A | Discharge status: Home / Self Care | Payer: Medicare PPO | Attending: Surgery | Admitting: Surgery

## 2021-02-13 ENCOUNTER — Encounter (HOSPITAL_COMMUNITY)
Admission: RE | Disposition: A | Discharge status: Home / Self Care | Payer: Self-pay | Source: Home / Self Care | Attending: Surgery

## 2021-02-13 ENCOUNTER — Other Ambulatory Visit: Payer: Self-pay

## 2021-02-13 DIAGNOSIS — I4891 Unspecified atrial fibrillation: Secondary | ICD-10-CM | POA: Insufficient documentation

## 2021-02-13 DIAGNOSIS — I701 Atherosclerosis of renal artery: Secondary | ICD-10-CM | POA: Insufficient documentation

## 2021-02-13 DIAGNOSIS — I4892 Unspecified atrial flutter: Secondary | ICD-10-CM | POA: Insufficient documentation

## 2021-02-13 DIAGNOSIS — Z79899 Other long term (current) drug therapy: Secondary | ICD-10-CM | POA: Insufficient documentation

## 2021-02-13 DIAGNOSIS — Z7901 Long term (current) use of anticoagulants: Secondary | ICD-10-CM | POA: Insufficient documentation

## 2021-02-13 DIAGNOSIS — I10 Essential (primary) hypertension: Secondary | ICD-10-CM | POA: Diagnosis not present

## 2021-02-13 HISTORY — PX: RENAL ANGIOGRAPHY: CATH118260

## 2021-02-13 HISTORY — PX: PERIPHERAL VASCULAR INTERVENTION: CATH118257

## 2021-02-13 LAB — POCT I-STAT, CHEM 8
BUN: 27 mg/dL — ABNORMAL HIGH (ref 8–23)
Calcium, Ion: 1.34 mmol/L (ref 1.15–1.40)
Chloride: 107 mmol/L (ref 98–111)
Creatinine, Ser: 1.4 mg/dL — ABNORMAL HIGH (ref 0.44–1.00)
Glucose, Bld: 96 mg/dL (ref 70–99)
HCT: 36 % (ref 36.0–46.0)
Hemoglobin: 12.2 g/dL (ref 12.0–15.0)
Potassium: 4.6 mmol/L (ref 3.5–5.1)
Sodium: 140 mmol/L (ref 135–145)
TCO2: 23 mmol/L (ref 22–32)

## 2021-02-13 SURGERY — RENAL ANGIOGRAPHY
Anesthesia: LOCAL | Laterality: Right

## 2021-02-13 MED ORDER — MORPHINE SULFATE (PF) 2 MG/ML IV SOLN: 2.0000 mg | INTRAVENOUS | Status: DC | PRN

## 2021-02-13 MED ORDER — HYDRALAZINE HCL 20 MG/ML IJ SOLN: 5.0000 mg | INTRAMUSCULAR | Status: DC | PRN

## 2021-02-13 MED ORDER — LIDOCAINE HCL (PF) 1 % IJ SOLN
INTRAMUSCULAR | Status: AC
  Filled 2021-02-13: qty 30

## 2021-02-13 MED ORDER — ONDANSETRON HCL 4 MG/2ML IJ SOLN: 4.0000 mg | Freq: Four times a day (QID) | INTRAMUSCULAR | Status: DC | PRN

## 2021-02-13 MED ORDER — OXYCODONE HCL 5 MG PO TABS: 5.0000 mg | ORAL_TABLET | ORAL | Status: DC | PRN

## 2021-02-13 MED ORDER — HEPARIN SODIUM (PORCINE) 1000 UNIT/ML IJ SOLN
INTRAMUSCULAR | Status: AC
  Filled 2021-02-13: qty 10

## 2021-02-13 MED ORDER — ASPIRIN EC 81 MG PO TBEC: 81.0000 mg | DELAYED_RELEASE_TABLET | Freq: Every day | ORAL | 2 refills | Status: DC

## 2021-02-13 MED ORDER — FENTANYL CITRATE (PF) 100 MCG/2ML IJ SOLN
INTRAMUSCULAR | Status: AC
  Filled 2021-02-13: qty 2

## 2021-02-13 MED ORDER — HEPARIN (PORCINE) IN NACL 1000-0.9 UT/500ML-% IV SOLN
INTRAVENOUS | Status: AC
  Filled 2021-02-13: qty 500

## 2021-02-13 MED ORDER — ASPIRIN EC 81 MG PO TBEC: 81.0000 mg | DELAYED_RELEASE_TABLET | Freq: Every day | ORAL | Status: DC

## 2021-02-13 MED ORDER — IODIXANOL 320 MG/ML IV SOLN
INTRAVENOUS | Status: DC | PRN
  Administered 2021-02-13: 11:00:00 75 mL via INTRA_ARTERIAL

## 2021-02-13 MED ORDER — LABETALOL HCL 5 MG/ML IV SOLN: 10.0000 mg | INTRAVENOUS | Status: DC | PRN

## 2021-02-13 MED ORDER — HEPARIN (PORCINE) IN NACL 1000-0.9 UT/500ML-% IV SOLN
INTRAVENOUS | Status: DC | PRN
  Administered 2021-02-13 (×2): 500 mL

## 2021-02-13 MED ORDER — SODIUM CHLORIDE 0.9% FLUSH: 3.0000 mL | Freq: Two times a day (BID) | INTRAVENOUS | Status: DC

## 2021-02-13 MED ORDER — FENTANYL CITRATE (PF) 100 MCG/2ML IJ SOLN
INTRAMUSCULAR | Status: DC | PRN
  Administered 2021-02-13: 50 ug via INTRAVENOUS

## 2021-02-13 MED ORDER — ROSUVASTATIN CALCIUM 10 MG PO TABS: 10.0000 mg | ORAL_TABLET | Freq: Every day | ORAL | 11 refills | Status: DC

## 2021-02-13 MED ORDER — SODIUM CHLORIDE 0.9 % IV SOLN: 250.0000 mL | INTRAVENOUS | Status: DC | PRN

## 2021-02-13 MED ORDER — MIDAZOLAM HCL 2 MG/2ML IJ SOLN
INTRAMUSCULAR | Status: DC | PRN
  Administered 2021-02-13: 1 mg via INTRAVENOUS

## 2021-02-13 MED ORDER — ACETAMINOPHEN 325 MG PO TABS: 650.0000 mg | ORAL_TABLET | ORAL | Status: DC | PRN

## 2021-02-13 MED ORDER — SODIUM CHLORIDE 0.9 % WEIGHT BASED INFUSION: 1.0000 mL/kg/h | INTRAVENOUS | Status: DC

## 2021-02-13 MED ORDER — HEPARIN SODIUM (PORCINE) 1000 UNIT/ML IJ SOLN
INTRAMUSCULAR | Status: DC | PRN
  Administered 2021-02-13: 5000 [IU] via INTRAVENOUS

## 2021-02-13 MED ORDER — LIDOCAINE HCL (PF) 1 % IJ SOLN
INTRAMUSCULAR | Status: DC | PRN
  Administered 2021-02-13: 18 mL via INTRADERMAL

## 2021-02-13 MED ORDER — SODIUM CHLORIDE 0.9 % IV SOLN: INTRAVENOUS | Status: DC

## 2021-02-13 MED ORDER — SODIUM CHLORIDE 0.9% FLUSH: 3.0000 mL | INTRAVENOUS | Status: DC | PRN

## 2021-02-13 MED ORDER — MIDAZOLAM HCL 2 MG/2ML IJ SOLN
INTRAMUSCULAR | Status: AC
  Filled 2021-02-13: qty 2

## 2021-02-13 SURGICAL SUPPLY — 16 items
CATH CROSS OVER TEMPO 5F (CATHETERS) ×2 IMPLANT
CATH OMNI FLUSH 5F 65CM (CATHETERS) ×2 IMPLANT
DEVICE VASC CLSR CELT ART 6 (Vascular Products) ×2 IMPLANT
GUIDE CATH VISTA IMA 6F (CATHETERS) ×2 IMPLANT
KIT ENCORE 26 ADVANTAGE (KITS) ×2 IMPLANT
KIT MICROPUNCTURE NIT STIFF (SHEATH) ×2 IMPLANT
KIT PV (KITS) ×4 IMPLANT
SHEATH PINNACLE 5F 10CM (SHEATH) ×2 IMPLANT
SHEATH PINNACLE 6F 10CM (SHEATH) ×2 IMPLANT
SHEATH PROBE COVER 6X72 (BAG) ×2 IMPLANT
STENT HERCULINK RX 5.5X12X135 (Permanent Stent) ×2 IMPLANT
SYR MEDRAD MARK V 150ML (SYRINGE) ×2 IMPLANT
TRANSDUCER W/STOPCOCK (MISCELLANEOUS) ×4 IMPLANT
TRAY PV CATH (CUSTOM PROCEDURE TRAY) ×4 IMPLANT
WIRE BENTSON .035X145CM (WIRE) ×2 IMPLANT
WIRE STABILIZER XS .014X180CM (WIRE) ×2 IMPLANT

## 2021-02-13 NOTE — Interval H&P Note (Signed)
History and Physical Interval Note:  02/13/2021 9:39 AM  Amy [name]  has presented today for surgery, with the diagnosis of renal artery stenosis.  The various methods of treatment have been discussed with the patient and family. After consideration of risks, benefits and other options for treatment, the patient has consented to  Procedure(s): RENAL ANGIOGRAPHY (N/A) as a surgical intervention.  The patient's history has been reviewed, patient examined, no change in status, stable for surgery.  I have reviewed the patient's chart and labs.  Questions were answered to the patient's satisfaction.     Annamarie Major

## 2021-02-13 NOTE — Op Note (Signed)
° ° °  Patient name: Amy [name] MRN: [id number] DOB: 05/18/1935 Sex: female  02/13/2021 Pre-operative Diagnosis: Renal artery stenosis Post-operative diagnosis:  Same Surgeon:  Annamarie Major Procedure Performed:  1.  Ultrasound-guided access, right femoral artery  2.  Abdominal gram  3.  Stent, right renal artery  4.  Conscious sedation, 45 minutes  5.  Closure device, Celt   Indications: This is a 86 year old female with difficult to control hypertension despite multiple medications, as well as renal disease.  She recently had a ultrasound that suggested right renal artery issues.  She is here for angiogram.  Procedure:  The patient was identified in the holding area and taken to room 8.  The patient was then placed supine on the table and prepped and draped in the usual sterile fashion.  A time out was called.  Conscious sedation was administered with the use of IV fentanyl and Versed under continuous physician and nurse monitoring.  Heart rate, blood pressure, and oxygen saturation were continuously monitored.  Total sedation time was 45 minutes.  Ultrasound was used to evaluate the right common femoral artery.  It was patent .  A digital ultrasound image was acquired.  A micropuncture needle was used to access the right common femoral artery under ultrasound guidance.  An 018 wire was advanced without resistance and a micropuncture sheath was placed.  The 018 wire was removed and a benson wire was placed.  The micropuncture sheath was exchanged for a 5 french sheath.  An omniflush catheter was advanced over the wire to the level of L-1.  An abdominal angiogram was obtained.  Findings:   Aortogram: Early opacification of the visceral artery branches was seen.  The left renal artery is widely patent without stenosis.  The infrarenal abdominal aorta and iliac vessels are widely patent without stenosis.  There appeared to be narrowing at the origin of the right renal artery.  Intervention:, A  crossover catheter was used to cannulate right renal artery.  After additional imaging from select catheterization, there appeared to be approximately a 60% stenosis at the origin of the right renal artery.  I decided to proceed with intervention at this point.  I switched out for 6 [place] sheath.  The patient was fully heparinized.  Using a IM guide catheter, the right renal artery was selected and a stabilizer wire was advanced out into the renal parenchyma.  I selected a 5.5 x 12 Herculink balloon expandable stent and deployed this at the origin of the renal artery.  Follow-up imaging showed resolution of the stenosis.  Catheters and wires were removed.  The groin was closed with a Celt.  There were no complications  Impression:  #1  Greater than 60% right renal artery stenosis, resolved after stenting using a 5.5 x 12 mm stent.  Theotis Burrow, M.D., [facility] [place] Office: [phone] Pager:  [phone]

## 2021-02-14 ENCOUNTER — Ambulatory Visit: Payer: Medicare PPO | Admitting: Physician Assistant

## 2021-02-14 ENCOUNTER — Encounter (HOSPITAL_COMMUNITY): Payer: Self-pay | Admitting: Surgery

## 2021-02-15 ENCOUNTER — Encounter: Payer: Self-pay | Admitting: Internal Medicine

## 2021-02-15 MED ORDER — AMLODIPINE BESYLATE 5 MG PO TABS: ORAL_TABLET | ORAL | 3 refills | Status: DC

## 2021-02-15 NOTE — Telephone Encounter (Signed)
Pt advised Dr. Harrington Challenger' recommendations.

## 2021-02-15 NOTE — Telephone Encounter (Signed)
Pt just had stent to renal artery  May take some time for BP to regroup Would add additional 2.5 mg amlodine in PM Continue to follow

## 2021-02-26 ENCOUNTER — Encounter: Payer: Self-pay | Admitting: Internal Medicine

## 2021-02-26 DIAGNOSIS — M7989 Other specified soft tissue disorders: Secondary | ICD-10-CM

## 2021-02-26 DIAGNOSIS — I10 Essential (primary) hypertension: Secondary | ICD-10-CM

## 2021-02-26 DIAGNOSIS — Z79899 Other long term (current) drug therapy: Secondary | ICD-10-CM

## 2021-02-26 NOTE — Telephone Encounter (Signed)
Called patient to review feet and ankle swelling and to obtain recent BP readings. Pt provides the following:  Date  AM  PM 1/27  140/65  150/74 1/28  162/77  139/60 1/29  138/54  140/70 1/30  151/70  147/68 1/31  154/68  144/64 2/1  149/65  128/58 2/2  157/66  131/61 2/3  141/64  158/76 2/4  159/66  157/72 2/5  162/72  169/75 2/6  157/71  Pt states that her feet and ankles have begun to swell and turn red, with associated burning, about mid-day to late afternoon beginning approx 10 days ago. Pt also states that her face flushes. All symptoms are intermittent per patient. Denies use of compression socks. Educated pt that use may help with the swelling and burning pain she is feeling and that she should elevate her legs when possible. Advised pt that I will forward message to Dr Harrington Challenger regarding medication changes.

## 2021-02-28 MED ORDER — HYDROCHLOROTHIAZIDE 12.5 MG PO CAPS: 12.5000 mg | ORAL_CAPSULE | Freq: Every day | ORAL | 6 refills | Status: DC

## 2021-02-28 MED ORDER — AMLODIPINE BESYLATE 5 MG PO TABS: 2.5000 mg | ORAL_TABLET | Freq: Every day | ORAL | 3 refills | Status: DC

## 2021-02-28 NOTE — Telephone Encounter (Signed)
Swelling and redness may be due to amlodipine  Cut back to 1/2 tab (2.5 ) daily Cotninue ovther meds  Amlodpione was addied instead of diltiazem to no lower heart rate since on b blocker Would add 12.5 daily HCTZ     Change BMET   Add BNP    Change date to next week   (Mon or Tues)

## 2021-03-02 ENCOUNTER — Other Ambulatory Visit: Payer: Medicare PPO

## 2021-03-02 ENCOUNTER — Telehealth: Payer: Self-pay

## 2021-03-02 NOTE — Telephone Encounter (Signed)
Called pt in regards to my chart message.  Pt reports takes HCTZ at 9 am and barely uses the bathroom during the day.  Reports increased urination starting at 12 MN until 4 am.  Pt reports prior to starting HCTZ used the bathroom around 11 pm and slept through the night.  Advised pt to try taking medication later in the day to see if increased urination hours would be during the day instead of night.  Reviewed pt chart and noted had a renal stent placed on 02/13/21.  Advised pt to call CV surgeon office to see if this is of concern.  Pt reports will call the office and send a my chart message with reply.

## 2021-03-02 NOTE — Telephone Encounter (Signed)
Pt called with questions about taking HCTZ that her cardiologist prescribed. I advised pt to defer this to their office. Pt verbalized understanding; no further questions/concerns at this time.

## 2021-03-04 NOTE — Telephone Encounter (Signed)
Please confirm when patient is set for BMET

## 2021-03-05 ENCOUNTER — Other Ambulatory Visit: Payer: Self-pay

## 2021-03-05 ENCOUNTER — Other Ambulatory Visit: Payer: Medicare PPO | Admitting: *Deleted

## 2021-03-05 DIAGNOSIS — M7989 Other specified soft tissue disorders: Secondary | ICD-10-CM

## 2021-03-05 DIAGNOSIS — D649 Anemia, unspecified: Secondary | ICD-10-CM | POA: Diagnosis not present

## 2021-03-05 DIAGNOSIS — Z79899 Other long term (current) drug therapy: Secondary | ICD-10-CM

## 2021-03-05 DIAGNOSIS — I10 Essential (primary) hypertension: Secondary | ICD-10-CM | POA: Diagnosis not present

## 2021-03-06 ENCOUNTER — Telehealth: Payer: Self-pay

## 2021-03-06 LAB — IRON AND TIBC
Iron Saturation: 20 % (ref 15–55)
Iron: 71 ug/dL (ref 27–139)
Total Iron Binding Capacity: 352 ug/dL (ref 250–450)
UIBC: 281 ug/dL (ref 118–369)

## 2021-03-06 LAB — CBC
Hematocrit: 32 % — ABNORMAL LOW (ref 34.0–46.6)
Hemoglobin: 10.7 g/dL — ABNORMAL LOW (ref 11.1–15.9)
MCH: 32.9 pg (ref 26.6–33.0)
MCHC: 33.4 g/dL (ref 31.5–35.7)
MCV: 99 fL — ABNORMAL HIGH (ref 79–97)
Platelets: 290 10*3/uL (ref 150–450)
RBC: 3.25 x10E6/uL — ABNORMAL LOW (ref 3.77–5.28)
RDW: 11.8 % (ref 11.7–15.4)
WBC: 6.9 10*3/uL (ref 3.4–10.8)

## 2021-03-06 LAB — PRO B NATRIURETIC PEPTIDE: NT-Pro BNP: 623 pg/mL (ref 0–738)

## 2021-03-06 LAB — BASIC METABOLIC PANEL
BUN/Creatinine Ratio: 18 (ref 12–28)
BUN: 29 mg/dL — ABNORMAL HIGH (ref 8–27)
CO2: 23 mmol/L (ref 20–29)
Calcium: 10 mg/dL (ref 8.7–10.3)
Chloride: 99 mmol/L (ref 96–106)
Creatinine, Ser: 1.65 mg/dL — ABNORMAL HIGH (ref 0.57–1.00)
Glucose: 76 mg/dL (ref 70–99)
Potassium: 5.1 mmol/L (ref 3.5–5.2)
Sodium: 139 mmol/L (ref 134–144)
eGFR: 31 mL/min/{1.73_m2} — ABNORMAL LOW (ref >59)

## 2021-03-06 LAB — FERRITIN: Ferritin: 67 ng/mL (ref 15–150)

## 2021-03-06 LAB — FOLATE: Folate: 11.3 ng/mL (ref >3.0)

## 2021-03-06 LAB — VITAMIN B12: Vitamin B-12: 237 pg/mL (ref 232–1245)

## 2021-03-06 NOTE — Telephone Encounter (Signed)
-----   Message from Dorris Carnes V, MD sent at 03/06/2021  8:35 AM EST ----- CBC:   Hgb is mildly decreased   Iron studies are OK Electrolytes OK Cr is up slightly 1.65    I would follow  How is blood pressure on current meds/

## 2021-03-06 NOTE — Telephone Encounter (Signed)
Spoke with the pt re: her labwork... she says her BP has been fluctuating... she did not have her readings available but says it stays around the 150's/ 70's with a few readings in the 170's and some down to 386-854 systolic.... she says she sees vascular again 03/19/21.   She ha a recall to see Dr. Harrington Challenger April/May 2023 but she would like a March appt and will bring her readings with her.   She says since decreasing the Amlodipine to 2.5 mg daily her lower extremity edema has improved.   I made her an appt for 04/09/21.

## 2021-03-16 ENCOUNTER — Other Ambulatory Visit: Payer: Self-pay

## 2021-03-16 DIAGNOSIS — I701 Atherosclerosis of renal artery: Secondary | ICD-10-CM

## 2021-03-19 ENCOUNTER — Encounter: Payer: Self-pay | Admitting: Physician Assistant

## 2021-03-19 ENCOUNTER — Ambulatory Visit (INDEPENDENT_AMBULATORY_CARE_PROVIDER_SITE_OTHER): Payer: Medicare PPO | Admitting: Physician Assistant

## 2021-03-19 ENCOUNTER — Ambulatory Visit (HOSPITAL_COMMUNITY)
Admission: RE | Admit: 2021-03-19 | Discharge: 2021-03-19 | Disposition: A | Discharge status: Home / Self Care | Payer: Medicare PPO | Source: Ambulatory Visit | Attending: Surgery | Admitting: Surgery

## 2021-03-19 ENCOUNTER — Encounter: Payer: Self-pay | Admitting: Internal Medicine

## 2021-03-19 ENCOUNTER — Other Ambulatory Visit: Payer: Self-pay

## 2021-03-19 VITALS — BP 136/55 | HR 62 | Temp 97.3°F | Resp 20 | Ht 67.0 in | Wt 131.7 lb

## 2021-03-19 DIAGNOSIS — I701 Atherosclerosis of renal artery: Secondary | ICD-10-CM

## 2021-03-19 NOTE — Progress Notes (Signed)
Office Note     CC:  follow up Requesting Provider:  Binnie Rail, MD  HPI: Amy [name] is a 86 y.o. ([birth year]) female who presents for follow up s/p right renal artery stenting by Dr. Trula Slade on 02/13/21. This was performed secondary to uncontrolled hypertension despite medical management on multiple agents and CKD. She tolerated the procedure well.  She reports still having issues with fluctuating blood pressure but overall it has been better. She has been in communication with her Cardiologist, Dr. Harrington Challenger regarding this. She reports that she has her next follow up with Dr. Harrington Challenger on 04/03/21. She otherwise states she is feeling fine. No issues with femoral access site after procedure. She reports that she was discharged on Crestor, Aspirin and Xarelto. She reports 3 days ago that she stopped taking her Aspirin as she felt it was not necessary with her also taking Xarelto. She has had some increase in bruising and has associated this with the Crestor.   The pt is on a statin for cholesterol management.  The pt is not on a daily aspirin.   Other AC:  Xarelto The pt is on HCTZ, BB, ARB, CCB for hypertension.   The pt is not diabetic.   Tobacco hx:  never  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   Right Malar Cheek   Palpitations     Past Surgical History:  Procedure Laterality Date   A FLUTTER ABLATION     KNEE ARTHROSCOPY Right    PERIPHERAL VASCULAR INTERVENTION Right 02/13/2021   Procedure: PERIPHERAL VASCULAR INTERVENTION;  Surgeon: Serafina Mitchell, MD;  Location: [facility];  Service: Cardiovascular;  Laterality: Right;   RENAL ANGIOGRAPHY N/A 02/13/2021   Procedure: RENAL ANGIOGRAPHY;  Surgeon: Serafina Mitchell, MD;  Location:  [facility];  Service: Cardiovascular;  Laterality: N/A;   TUBAL LIGATION      Social History   Socioeconomic History   Marital status: Married    Spouse name: Not on file   Number of children: 1   Years of education: Not on file   Highest education level: Not on file  Occupational History   Occupation: Retired  Tobacco Use   Smoking status: Never    Passive exposure: Never   Smokeless tobacco: Never  Vaping Use   Vaping Use: Never used  Substance and Sexual Activity   Alcohol use: No   Drug use: No   Sexual activity: Not on file  Other Topics Concern   Not on file  Social History Narrative   Occ caffeine    Social Determinants of Health   Financial Resource Strain: Not on file  Food Insecurity: Not on file  Transportation Needs: Not on file  Physical Activity: Not on file  Stress: Not on file  Social Connections: Not on file  Intimate Partner Violence: Not on file    Family History  Problem Relation Age of Onset   Stroke Father 60       died from stroke at age 14   Stroke Mother 60       died from stroke at age 84   Heart disease Mother  Colon cancer Brother     Current Outpatient Medications  Medication Sig Dispense Refill   acetaminophen (TYLENOL) 650 MG CR tablet Take 1,300 mg by mouth 2 (two) times daily as needed for pain.     alclomethasone (ACLOVATE) 0.05 % cream Apply topically at bedtime. 60 g 0   AMBULATORY NON FORMULARY MEDICATION Take 1 drop by mouth daily as needed (pain). CBD Oil 1 dose under tongue once daily as needed for pain     amLODipine (NORVASC) 5 MG tablet Take 0.5 tablets (2.5 mg total) by mouth daily. 45 tablet 3   atenolol (TENORMIN) 50 MG tablet Take 1 tablet (50 mg total) by mouth daily. 90 tablet 3   Biotin 5000 MCG CAPS Take 5,000 mcg by mouth daily.     Calcium Carbonate-Vitamin D (CALCIUM + D PO) Take 1 tablet by mouth daily.     Cholecalciferol 100 MCG (4000 UT) CAPS Take 1 capsule (4,000 Units total) by mouth  daily. 30 capsule    cloNIDine (CATAPRES) 0.1 MG tablet Take 1 tablet (0.1 mg total) by mouth 3 (three) times daily. 270 tablet 3   dorzolamide-timolol (COSOPT) 22.3-6.8 MG/ML ophthalmic solution Place 1 drop into both eyes 2 (two) times daily.     flecainide (TAMBOCOR) 50 MG tablet TAKE 1 TABLET BY MOUTH TWICE A DAY 180 tablet 3   hydrALAZINE (APRESOLINE) 100 MG tablet Take 1 tablet (100 mg total) by mouth 3 (three) times daily. 270 tablet 3   hydrochlorothiazide (MICROZIDE) 12.5 MG capsule Take 1 capsule (12.5 mg total) by mouth daily. 30 capsule 6   levothyroxine (SYNTHROID) 50 MCG tablet TAKE 1 TABLET BY MOUTH EVERY DAY 90 tablet 3   linaclotide (LINZESS) 72 MCG capsule Take 1 capsule (72 mcg total) by mouth daily. (Patient taking differently: Take 72 mcg by mouth daily as needed (constipation).) 30 capsule 11   losartan (COZAAR) 50 MG tablet Take 1 tablet (50 mg total) by mouth 2 (two) times daily. 180 tablet 3   rosuvastatin (CRESTOR) 10 MG tablet Take 1 tablet (10 mg total) by mouth daily. 30 tablet 11   XARELTO 15 MG TABS tablet TAKE 1 TABLET BY MOUTH EVERY DAY WITH SUPPER 30 tablet 5   aspirin EC 81 MG tablet Take 1 tablet (81 mg total) by mouth daily. Swallow whole. (Patient not taking: Reported on 03/19/2021) 150 tablet 2   No current facility-administered medications for this visit.    No Known Allergies   REVIEW OF SYSTEMS:   [X]  denotes positive finding, [ ]  denotes negative finding Cardiac  Comments:  Chest pain or chest pressure:    Shortness of breath upon exertion:    Short of breath when lying flat:    Irregular heart rhythm:        Vascular    Pain in calf, thigh, or hip brought on by ambulation:    Pain in feet at night that wakes you up from your sleep:     Blood clot in your veins:    Leg swelling:         Pulmonary    Oxygen at home:    Productive cough:     Wheezing:         Neurologic    Sudden weakness in arms or legs:     Sudden numbness in arms or  legs:     Sudden onset of difficulty speaking or slurred speech:    Temporary loss of vision in one eye:  Problems with dizziness:         Gastrointestinal    Blood in stool:     Vomited blood:         Genitourinary    Burning when urinating:     Blood in urine:        Psychiatric    Major depression:         Hematologic    Bleeding problems:    Problems with blood clotting too easily:        Skin    Rashes or ulcers:        Constitutional    Fever or chills:      PHYSICAL EXAMINATION:  Vitals:   03/19/21 0935  BP: (!) 136/55  Pulse: 62  Resp: 20  Temp: (!) 97.3 F (36.3 C)  TempSrc: Temporal  SpO2: 96%  Weight: 131 lb 11.2 oz (59.7 kg)  Height: 5\' 7"  (1.702 m)    General:  WDWN in NAD; vital signs documented above Gait: Normal HENT: WNL, normocephalic Pulmonary: normal non-labored breathing , without wheezing Cardiac: regular HR, without  Murmurs without carotid bruit Abdomen: soft, NT, no masses Vascular Exam/Pulses:  Right Left  Radial 2+ (normal) 2+ (normal)  Femoral 2+ (normal) 2+ (normal)  Popliteal 2+ (normal) 2+ (normal)  DP 2+ (normal) 2+ (normal)  PT 2+ (normal) 2+ (normal)   Extremities: without ischemic changes, without Gangrene , without cellulitis; without open wounds;  Musculoskeletal: no muscle wasting or atrophy  Neurologic: A&O X 3;  No focal weakness or paresthesias are detected Psychiatric:  The pt has Normal affect.   Non-Invasive Vascular Imaging:   VAS US Renal Art. Duplex: 03/19/21 Duplex shows no evidence of right renal artery stenosis. Unable to visualize well the right renal artery stent. Flow present in RRV. No evidence of left renal artery stenosis.Flow present in LRV   ASSESSMENT/PLAN:: 86 y.o. female here for follow up s/p right renal artery stenting on 02/13/21 by Dr. Trula Slade. This was performed secondary to uncontrolled hypertension despite medical management on multiple agents and CKD. She has done well s/p  stenting. No femoral access site issues. Her duplex today does not show any signs of stenosis however was difficult exam and tech was not able to directly visualize patency of the right renal artery stent. Patient reports improving blood pressure. Labs show stable renal function. She will follow up with Cardiologist for continued management of her hypertension - Recommend continuing the Aspirin and statin. Discussed from of Aspirin as antiplatelet and in helping keep her stent patent. Also discussed with her that her bruising likely was from being on both the Aspirin and the Xarelto and no as a result of the Crestor. Patient presently just wants to take Xarelto and Statin and I Advised her to discuss this with cardiologist if she would feel more comfortable - She will follow up with [place] in 9 months with repeat Renal artery duplex. She knows to call for earlier follow up if any concerns  Karoline Caldwell, PA-C [facility] [phone]  Clinic MD:   Dr. Trula Slade

## 2021-03-23 ENCOUNTER — Other Ambulatory Visit: Payer: Self-pay | Admitting: *Deleted

## 2021-03-23 DIAGNOSIS — I701 Atherosclerosis of renal artery: Secondary | ICD-10-CM

## 2021-04-08 NOTE — Progress Notes (Signed)
? ?Cardiology Office Note ? ? ?Date:  04/12/2021  ? ?ID:  Amy [name], DOB 1935/09/21, MRN [id number] ? ?PCP:  Binnie Rail, MD  ?Cardiologist:   Dorris Carnes, MD  ? ? ?Pt presents for eval of swelling   ?  ?History of Present Illness: ?Amy [name] is a 86 y.o. female with a history of atrial flutter (s/p ablation), PAF and HTN  ? ?I saw the pt in September 2022     At that time her BP was fair    ?She called in in early December   BP HIGH   198/   Over the next several weeks her meds were increased    She was also set up for a RA USN   This showed probable occlusion of R Renal artery  Went on to see Pierre Bali and then have PTA/stent on 02/13/21 ?Post procedure amlodipine increased to 2.5 bid as bp high   She developed some swelling and cut back on amlodipine to daily    ? ?Since seen the pt denies CP   No palpitatoins   BP at home 150s to 160s/   ?The pt says she took ASA and Crestor (rx by Pierre Bali) for 5 days   Stopped     ? ?Outpatient Medications Prior to Visit  ?Medication Sig Dispense Refill  ? acetaminophen (TYLENOL) 650 MG CR tablet Take 1,300 mg by mouth 2 (two) times daily as needed for pain.    ? alclomethasone (ACLOVATE) 0.05 % cream Apply topically at bedtime. 60 g 0  ? AMBULATORY NON FORMULARY MEDICATION Take 1 drop by mouth daily as needed (pain). CBD Oil ?1 dose under tongue once daily as needed for pain    ? aspirin EC 81 MG tablet Take 1 tablet (81 mg total) by mouth daily. Swallow whole. 150 tablet 2  ? atenolol (TENORMIN) 50 MG tablet Take 1 tablet (50 mg total) by mouth daily. 90 tablet 3  ? Biotin 5000 MCG CAPS Take 5,000 mcg by mouth daily.    ? Calcium Carbonate-Vitamin D (CALCIUM + D PO) Take 1 tablet by mouth daily.    ? Cholecalciferol 100 MCG (4000 UT) CAPS Take 1 capsule (4,000 Units total) by mouth daily. 30 capsule   ? cloNIDine (CATAPRES) 0.1 MG tablet Take 1 tablet (0.1 mg total) by mouth 3 (three) times daily. 270 tablet 3  ? dorzolamide-timolol (COSOPT) 22.3-6.8 MG/ML  ophthalmic solution Place 1 drop into both eyes 2 (two) times daily.    ? flecainide (TAMBOCOR) 50 MG tablet TAKE 1 TABLET BY MOUTH TWICE A DAY 180 tablet 3  ? hydrALAZINE (APRESOLINE) 100 MG tablet Take 1 tablet (100 mg total) by mouth 3 (three) times daily. 270 tablet 3  ? hydrochlorothiazide (MICROZIDE) 12.5 MG capsule Take 1 capsule (12.5 mg total) by mouth daily. 30 capsule 6  ? levothyroxine (SYNTHROID) 50 MCG tablet TAKE 1 TABLET BY MOUTH EVERY DAY 90 tablet 3  ? linaclotide (LINZESS) 72 MCG capsule Take 1 capsule (72 mcg total) by mouth daily. (Patient taking differently: Take 72 mcg by mouth daily as needed (constipation).) 30 capsule 11  ? losartan (COZAAR) 50 MG tablet Take 1 tablet (50 mg total) by mouth 2 (two) times daily. 180 tablet 3  ? rosuvastatin (CRESTOR) 10 MG tablet Take 1 tablet (10 mg total) by mouth daily. 30 tablet 11  ? XARELTO 15 MG TABS tablet TAKE 1 TABLET BY MOUTH EVERY DAY WITH SUPPER 30 tablet 5  ?  amLODipine (NORVASC) 5 MG tablet Take 0.5 tablets (2.5 mg total) by mouth daily. 45 tablet 3  ? ?No facility-administered medications prior to visit.  ? ? ? ?Allergies:   Patient has no known allergies.  ? ?Past Medical History:  ?Diagnosis Date  ? Anemia   ? Arthritis   ? Atrial fibrillation ([place])   ? Atrial flutter ([place])   ? s/p ablation  ? Atypical mole 09/07/2013  ? LOWER LEG SEVERE TX= WIDER SHAVE   ? HTN (hypertension)   ? Hypothyroidism   ? Nodular basal cell carcinoma (BCC) 03/29/2020  ? Right Malar Cheek  ? Palpitations   ? ? ?Past Surgical History:  ?Procedure Laterality Date  ? A FLUTTER ABLATION    ? KNEE ARTHROSCOPY Right   ? PERIPHERAL VASCULAR INTERVENTION Right 02/13/2021  ? Procedure: PERIPHERAL VASCULAR INTERVENTION;  Surgeon: Serafina Mitchell, MD;  Location: [facility];  Service: Cardiovascular;  Laterality: Right;  ? RENAL ANGIOGRAPHY N/A 02/13/2021  ? Procedure: RENAL ANGIOGRAPHY;  Surgeon: Serafina Mitchell, MD;  Location: [facility];  Service:  Cardiovascular;  Laterality: N/A;  ? TUBAL LIGATION    ? ? ? ?Social History:  The patient  reports that she has never smoked. She has never been exposed to tobacco smoke. She has never used smokeless tobacco. She reports that she does not drink alcohol and does not use drugs.  ? ?Family History:  The patient's family history includes Colon cancer in her brother; Heart disease in her mother; Stroke (age of onset: 64) in her mother; Stroke (age of onset: 28) in her father.  ? ? ?ROS:  Please see the history of present illness. All other systems are reviewed and  Negative to the above problem except as noted.  ? ? ?PHYSICAL EXAM: ?VS:  BP (!) 160/76   Pulse (!) 57   Ht '5\' 7"'$  (1.702 m)   Wt 134 lb 3.2 oz (60.9 kg)   LMP  (LMP Unknown)   SpO2 95%   BMI 21.02 kg/m?     ? ?GEN: Thin 86 yo who is in NAD  ?HEENT: normal  ?Neck: JVP is normal    ?Cardiac: RRR; no murmurs   NO LE edema  ?Respiratory:  clear to auscultation  ?GII: soft, nontender, nondistended, + BS  No hepatomegaly  ?MS: no deformity Moving all extremities   ?Skin: warm and dry, no rash ?Psych: euthymic mood, full affect ? ? ?EKG:  EKG not done  ? ?RA USN  ?Summary: ?Renal: ?Right: Abnormal right Resistive Index. Normal cortical thickness of right kidney. ?Probable occlusion of the right renal artery. ?Left: Abnormal left Resisitve Index. Normal cortical thickness of the left kidney. Normal size of ?left kidney. No evidence of left renal artery stenosis. LRV flow present. ?Mesenteric: ?Normal Celiac artery findings. 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 ? ? ?Lipid Panel ?   ?Component Value Date/Time  ? CHOL 219 (H) 10/12/2020 1617  ? TRIG 85 10/12/2020 1617  ? HDL 95 10/12/2020 1617  ? CHOLHDL 2.3 10/12/2020 1617  ? CHOLHDL 2.0 06/05/2015 0946  ? VLDL 10 06/05/2015 0946  ? LDLCALC 109 (H) 10/12/2020 1617  ? LDLDIRECT 84.9 11/15/2008 0924  ? ?  ? ?Wt Readings from Last 3 Encounters:  ?04/09/21 134 lb 3.2 oz (60.9 kg)  ?03/19/21 131 lb  11.2 oz (59.7 kg)  ?02/13/21 130 lb (59 kg)  ?  ? ? ?ASSESSMENT AND PLAN: ? ?1  HTN  BP is still not optimal   I would try increasing amlodipine again t o2.5 bid   Keep tabs on BP ? ?3  PAF Denies palpitations  Keep on same regimen ? ? ?4 PAD  Pt with mesenteric stenosis noted     Asymptomatic     ?Pt stopped Crestor   I have asked her to resume to help prevent progression  ? ?5  Lipids  LDL 109  HDL 95    Restart Crestor.   Follow up labs in a couple months    ?   ?Current medicines are reviewed at length with the patient today.  The patient does not have concerns regarding medicines. ? ?Signed, ?Dorris Carnes, MD  ?04/12/2021 11:44 PM    ?[facility] ?[address], [place], [place]  [address] ?Phone: [phone]; Fax: [phone]  ? ? ?

## 2021-04-09 ENCOUNTER — Ambulatory Visit: Payer: Medicare PPO | Admitting: Internal Medicine

## 2021-04-09 ENCOUNTER — Other Ambulatory Visit: Payer: Self-pay

## 2021-04-09 ENCOUNTER — Encounter: Payer: Self-pay | Admitting: Internal Medicine

## 2021-04-09 ENCOUNTER — Telehealth: Payer: Self-pay

## 2021-04-09 VITALS — BP 160/76 | HR 57 | Ht 67.0 in | Wt 134.2 lb

## 2021-04-09 DIAGNOSIS — Z79899 Other long term (current) drug therapy: Secondary | ICD-10-CM

## 2021-04-09 DIAGNOSIS — E039 Hypothyroidism, unspecified: Secondary | ICD-10-CM | POA: Diagnosis not present

## 2021-04-09 DIAGNOSIS — I10 Essential (primary) hypertension: Secondary | ICD-10-CM

## 2021-04-09 DIAGNOSIS — I701 Atherosclerosis of renal artery: Secondary | ICD-10-CM

## 2021-04-09 DIAGNOSIS — I480 Paroxysmal atrial fibrillation: Secondary | ICD-10-CM

## 2021-04-09 MED ORDER — AMLODIPINE BESYLATE 2.5 MG PO TABS: 2.5000 mg | ORAL_TABLET | Freq: Two times a day (BID) | ORAL | 3 refills | Status: DC

## 2021-04-09 NOTE — Patient Instructions (Signed)
Medication Instructions:  ?Increase your Amlodipine to 2.5 mg twice a dat and if tolerates take 5 mg once a day  ? ?Crestor 10 mg daily  ? ?ASA 81 mg daily  ? ?*If you need a refill on your cardiac medications before your next appointment, please call your pharmacy* ? ? ?Lab Work: ?TSH, CBC, BMET, NMR ?If you have labs (blood work) drawn today and your tests are completely normal, you will receive your results only by: ?MyChart Message (if you have MyChart) OR ?A paper copy in the mail ?If you have any lab test that is abnormal or we need to change your treatment, we will call you to review the results. ? ? ?Testing/Procedures: ?NONE ? ? ?Follow-Up: ?At [facility], you and your health needs are our priority.  As part of our continuing mission to provide you with exceptional heart care, we have created designated Provider Care Teams.  These Care Teams include your primary Cardiologist (physician) and Advanced Practice Providers (APPs -  Physician Assistants and Nurse Practitioners) who all work together to provide you with the care you need, when you need it. ? ?We recommend signing up for the patient portal called "MyChart".  Sign up information is provided on this After Visit Summary.  MyChart is used to connect with patients for Virtual Visits (Telemedicine).  Patients are able to view lab/test results, encounter notes, upcoming appointments, etc.  Non-urgent messages can be sent to your provider as well.   ?To learn more about what you can do with MyChart, go to [email or web address].   ? ?Your next appointment:   ?6 month(s) ? ?The format for your next appointment:   ?In Person ? ?Provider:   ?Dorris Carnes, MD   ? ? ?Other Instruction ?

## 2021-04-09 NOTE — Telephone Encounter (Signed)
Need to call the pt later today or tomorrow to verify which mg of Amlodipine she has at home... she will now be taking 2.5 mg bid per her 3/20 OV with Dr. Harrington Challenger.  ? ?Pt asked that I call her to obtain her BP readings and go over her new dosing.  ?

## 2021-04-10 LAB — NMR, LIPOPROFILE
Cholesterol, Total: 184 mg/dL (ref 100–199)
HDL Particle Number: 34.4 umol/L (ref >=30.5)
HDL-C: 93 mg/dL (ref >39)
LDL Particle Number: 658 nmol/L (ref <1000)
LDL Size: 21.1 nm (ref >20.5)
LDL-C (NIH Calc): 76 mg/dL (ref 0–99)
LP-IR Score: <25 (ref <=45)
Small LDL Particle Number: <90 nmol/L (ref <=527)
Triglycerides: 83 mg/dL (ref 0–149)

## 2021-04-10 LAB — BASIC METABOLIC PANEL
BUN/Creatinine Ratio: 18 (ref 12–28)
BUN: 24 mg/dL (ref 8–27)
CO2: 25 mmol/L (ref 20–29)
Calcium: 10.4 mg/dL — ABNORMAL HIGH (ref 8.7–10.3)
Chloride: 101 mmol/L (ref 96–106)
Creatinine, Ser: 1.3 mg/dL — ABNORMAL HIGH (ref 0.57–1.00)
Glucose: 96 mg/dL (ref 70–99)
Potassium: 4.6 mmol/L (ref 3.5–5.2)
Sodium: 138 mmol/L (ref 134–144)
eGFR: 41 mL/min/{1.73_m2} — ABNORMAL LOW (ref >59)

## 2021-04-10 LAB — CBC
Hematocrit: 33.1 % — ABNORMAL LOW (ref 34.0–46.6)
Hemoglobin: 11 g/dL — ABNORMAL LOW (ref 11.1–15.9)
MCH: 31.6 pg (ref 26.6–33.0)
MCHC: 33.2 g/dL (ref 31.5–35.7)
MCV: 95 fL (ref 79–97)
Platelets: 247 10*3/uL (ref 150–450)
RBC: 3.48 x10E6/uL — ABNORMAL LOW (ref 3.77–5.28)
RDW: 11.7 % (ref 11.7–15.4)
WBC: 7.1 10*3/uL (ref 3.4–10.8)

## 2021-04-10 LAB — TSH: TSH: 4.49 u[IU]/mL (ref 0.450–4.500)

## 2021-04-10 NOTE — Telephone Encounter (Signed)
Responded to the pts My Chart.. will close this encounter.  ?

## 2021-04-22 ENCOUNTER — Encounter: Payer: Self-pay | Admitting: Internal Medicine

## 2021-04-22 NOTE — Progress Notes (Signed)
? ? ?Subjective:  ? ? Patient ID: Amy [name], female    DOB: [birth year], 86 y.o.   MRN: [id number] ? ?This visit occurred during the SARS-CoV-2 public health emergency.  Safety protocols were in place, including screening questions prior to the visit, additional usage of staff PPE, and extensive cleaning of exam room while observing appropriate contact time as indicated for disinfecting solutions. ? ? ? ?HPI ?Amy [name] is here for  ?Chief Complaint  ?Patient presents with  ? Hand Pain  ? Insomnia  ? ? ? ?insomnia -  once she gets to sleep she is ok - but has difficulty getting back to sleep.  She is taking the melatonin gummies.  She states nothing is wrong - she just can not relax enough to get to sleep.  She gets up to go to the bathroom at night ( 2 times) and has difficulty getting back to sleep.  She does not nap during the day.  She denies feeling tired during the day.  In bed 11:30 pm - 8 am.   ? ? ?Right hand pain - anything cold makes it hurt.  The ends of her fingers tingle at times.  It started about one month ago.  She has pain at times.  No swelling or discoloration.  No injuries or new activities.  no neck or arm pain.   Symptoms not getting worse.  No symptoms in left hand.    ? ? ?Has constipation often.  Taking linzess as needed but it can cause diarrhea. She wondered what else she can take that is natural.    ? ? ? ?Medications and allergies reviewed with patient and updated if appropriate. ? ?Current Outpatient Medications on File Prior to Visit  ?Medication Sig Dispense Refill  ? acetaminophen (TYLENOL) 650 MG CR tablet Take 1,300 mg by mouth 2 (two) times daily as needed for pain.    ? alclomethasone (ACLOVATE) 0.05 % cream Apply topically at bedtime. 60 g 0  ? AMBULATORY NON FORMULARY MEDICATION Take 1 drop by mouth daily as needed (pain). CBD Oil ?1 dose under tongue once daily as needed for pain    ? amLODipine (NORVASC) 2.5 MG tablet Take 1 tablet (2.5 mg total) by mouth 2 (two) times daily.  180 tablet 3  ? aspirin EC 81 MG tablet Take 1 tablet (81 mg total) by mouth daily. Swallow whole. 150 tablet 2  ? atenolol (TENORMIN) 50 MG tablet Take 1 tablet (50 mg total) by mouth daily. 90 tablet 3  ? Biotin 5000 MCG CAPS Take 5,000 mcg by mouth daily.    ? Calcium Carbonate-Vitamin D (CALCIUM + D PO) Take 1 tablet by mouth daily.    ? Cholecalciferol 100 MCG (4000 UT) CAPS Take 1 capsule (4,000 Units total) by mouth daily. 30 capsule   ? cloNIDine (CATAPRES) 0.1 MG tablet Take 1 tablet (0.1 mg total) by mouth 3 (three) times daily. 270 tablet 3  ? diltiazem (CARDIZEM CD) 240 MG 24 hr capsule Take by mouth.    ? dorzolamide-timolol (COSOPT) 22.3-6.8 MG/ML ophthalmic solution Place 1 drop into both eyes 2 (two) times daily.    ? flecainide (TAMBOCOR) 50 MG tablet TAKE 1 TABLET BY MOUTH TWICE A DAY 180 tablet 3  ? hydrALAZINE (APRESOLINE) 100 MG tablet Take 1 tablet (100 mg total) by mouth 3 (three) times daily. 270 tablet 3  ? hydrochlorothiazide (MICROZIDE) 12.5 MG capsule Take 1 capsule (12.5 mg total) by mouth daily. 30 capsule 6  ?  levothyroxine (SYNTHROID) 50 MCG tablet TAKE 1 TABLET BY MOUTH EVERY DAY 90 tablet 3  ? linaclotide (LINZESS) 72 MCG capsule Take 1 capsule (72 mcg total) by mouth daily. (Patient taking differently: Take 72 mcg by mouth daily as needed (constipation).) 30 capsule 11  ? losartan (COZAAR) 50 MG tablet Take 1 tablet (50 mg total) by mouth 2 (two) times daily. 180 tablet 3  ? rosuvastatin (CRESTOR) 10 MG tablet Take 1 tablet (10 mg total) by mouth daily. 30 tablet 11  ? XARELTO 15 MG TABS tablet TAKE 1 TABLET BY MOUTH EVERY DAY WITH SUPPER 30 tablet 5  ? ?No current facility-administered medications on file prior to visit.  ? ? ?Review of Systems ? ?   ?Objective:  ? ?Vitals:  ? 04/23/21 1329  ?BP: (!) 156/60  ?Pulse: 60  ?Temp: 97.9 ?F (36.6 ?C)  ?SpO2: 97%  ? ?BP Readings from Last 3 Encounters:  ?04/23/21 (!) 156/60  ?04/09/21 (!) 160/76  ?03/19/21 (!) 136/55  ? ?Wt Readings from  Last 3 Encounters:  ?04/23/21 131 lb (59.4 kg)  ?04/09/21 134 lb 3.2 oz (60.9 kg)  ?03/19/21 131 lb 11.2 oz (59.7 kg)  ? ?Body mass index is 20.52 kg/m?. ? ?  ?Physical Exam ?Constitutional:   ?   General: She is not in acute distress. ?   Appearance: Normal appearance.  ?HENT:  ?   Head: Normocephalic.  ?Musculoskeletal:     ?   General: Normal range of motion.  ?   Comments: Right hand w/o swelling, discoloration/bruising, tenderness.  Mild muscle atrophy but that is B/L  ?Neurological:  ?   Mental Status: She is alert.  ?   Sensory: Sensory deficit (mild altered sensation in right finger tips) present.  ?   Motor: No weakness.  ?Psychiatric:     ?   Mood and Affect: Mood normal.  ? ?   ? ? ? ? ? ?Assessment & Plan:  ? ? ?See Problem List for Assessment and Plan of chronic medical problems.  ? ? ? ? ?

## 2021-04-23 ENCOUNTER — Ambulatory Visit: Payer: Medicare PPO | Admitting: Internal Medicine

## 2021-04-23 VITALS — BP 156/60 | HR 60 | Temp 97.9°F | Ht 67.0 in | Wt 131.0 lb

## 2021-04-23 DIAGNOSIS — F5101 Primary insomnia: Secondary | ICD-10-CM

## 2021-04-23 DIAGNOSIS — G4700 Insomnia, unspecified: Secondary | ICD-10-CM | POA: Insufficient documentation

## 2021-04-23 DIAGNOSIS — R202 Paresthesia of skin: Secondary | ICD-10-CM | POA: Diagnosis not present

## 2021-04-23 DIAGNOSIS — K5904 Chronic idiopathic constipation: Secondary | ICD-10-CM | POA: Diagnosis not present

## 2021-04-23 MED ORDER — TRAZODONE HCL 50 MG PO TABS: 25.0000 mg | ORAL_TABLET | Freq: Every evening | ORAL | 5 refills | Status: DC | PRN

## 2021-04-23 NOTE — Assessment & Plan Note (Signed)
New ?Started about a month or so ago ?Has difficulty falling asleep and getting back to sleep after she gets up-usually go to the bathroom ?Sleep is broken, not sure how much sleep she is getting, but does not feel tired during the day ?Taking melatonin Gummies, which helps some but seem to be helping less now ?Advised not to take too much of the melatonin Gummies that that can affect sleep negatively-advised not to take over 5 mg ?Increase activity during the day ?Decrease fluids later in the evening so she does not get up to go the bathroom ?She does want to try something-trial of trazodone 25-50 mg nightly as needed-advised she does not need to take on a nightly basis ?

## 2021-04-23 NOTE — Assessment & Plan Note (Signed)
Chronic ?Currently taking Linzess 72 mcg daily as needed, but this often causes diarrhea so she does not take always when she needs it ?Advised that she can try Metamucil daily, magnesium OTC, smooth move tea, stool softener, discussed certain fruits-prunes, dried fruit ?Advised that she needs to experiment with some of these things to see what works and take it consistently or adjust the dose accordingly ?

## 2021-04-23 NOTE — Assessment & Plan Note (Signed)
Acute ?Cold sensation in fingers and ends of fingertips tingle at times ?Started a month ago without obvious cause ?No neck pain or arm pain ?Symptoms not getting worse ?Symptoms not worse at night, not positional or activity related ?Will refer to Ortho for further evaluation, possible EMG ?

## 2021-04-23 NOTE — Patient Instructions (Addendum)
? ? ? ?  Medications changes include :   trazodone 25-50 mg at bedtime for sleep.  ? ? ?Your prescription(s) have been sent to your pharmacy.  ? ? ?A referral was ordered for [facility].     Someone from that office will call you to schedule an appointment.  ? ? ?Return for follow up as scheduled. ? ?

## 2021-05-10 ENCOUNTER — Other Ambulatory Visit: Payer: Self-pay | Admitting: Internal Medicine

## 2021-05-10 DIAGNOSIS — E039 Hypothyroidism, unspecified: Secondary | ICD-10-CM

## 2021-05-10 NOTE — Telephone Encounter (Signed)
Pt's pharmacy is requesting a refill on levothyroxine. Would Dr. Ross like to refill this medication? Please address °

## 2021-05-15 ENCOUNTER — Other Ambulatory Visit: Payer: Self-pay | Admitting: Internal Medicine

## 2021-05-16 DIAGNOSIS — H26492 Other secondary cataract, left eye: Secondary | ICD-10-CM | POA: Diagnosis not present

## 2021-05-16 DIAGNOSIS — H40012 Open angle with borderline findings, low risk, left eye: Secondary | ICD-10-CM | POA: Diagnosis not present

## 2021-05-16 DIAGNOSIS — Z961 Presence of intraocular lens: Secondary | ICD-10-CM | POA: Diagnosis not present

## 2021-05-16 DIAGNOSIS — H401111 Primary open-angle glaucoma, right eye, mild stage: Secondary | ICD-10-CM | POA: Diagnosis not present

## 2021-05-17 NOTE — Telephone Encounter (Signed)
Please make appt for patinet to see me.   (Let me know days available to work with you , 1/2 day) ?

## 2021-05-29 ENCOUNTER — Ambulatory Visit: Payer: Medicare PPO | Admitting: Internal Medicine

## 2021-05-29 ENCOUNTER — Encounter: Payer: Self-pay | Admitting: Internal Medicine

## 2021-05-29 VITALS — BP 140/66 | HR 59 | Ht 67.0 in | Wt 128.6 lb

## 2021-05-29 DIAGNOSIS — N289 Disorder of kidney and ureter, unspecified: Secondary | ICD-10-CM | POA: Diagnosis not present

## 2021-05-29 DIAGNOSIS — Z79899 Other long term (current) drug therapy: Secondary | ICD-10-CM

## 2021-05-29 DIAGNOSIS — E785 Hyperlipidemia, unspecified: Secondary | ICD-10-CM | POA: Diagnosis not present

## 2021-05-29 NOTE — Progress Notes (Signed)
? ?Cardiology Office Note ? ? ?Date:  05/29/2021  ? ?ID:  Amy [name], DOB 1935-02-15, MRN [id number] ? ?PCP:  Binnie Rail, MD  ?Cardiologist:   Dorris Carnes, MD  ? ? ?Pt presents for follow up of HTN    ?  ?History of Present Illness: ?Amy [name] is a 86 y.o. female with a history of atrial flutter (s/p ablation), PAF and HTN  ?In Dec 2022 the pt developed severe elevations of BP    198/    Iniitially meds increased    RA USN done  Showed occlusion of R renal artery   Seen by Pierre Bali   Underwent PTA / stent  02/13/21.  After procedure the Amlodipine increased to 2.5 bid    Cut back to daily due to swelling    ? ?Since seen last she has been feeling good   Breathing OK   no palpitations   No CP    She says her BP has been fairly good at home     ?Pt stopped ASA    Stopped Crestor  Taking Xarelto every other day  (discussed risks in past, patient still aware) ? ?Outpatient Medications Prior to Visit  ?Medication Sig Dispense Refill  ? acetaminophen (TYLENOL) 650 MG CR tablet Take 1,300 mg by mouth 2 (two) times daily as needed for pain.    ? alclomethasone (ACLOVATE) 0.05 % cream Apply topically at bedtime. 60 g 0  ? AMBULATORY NON FORMULARY MEDICATION Take 1 drop by mouth daily as needed (pain). CBD Oil ?1 dose under tongue once daily as needed for pain    ? amLODipine (NORVASC) 2.5 MG tablet Take 1 tablet (2.5 mg total) by mouth 2 (two) times daily. 180 tablet 3  ? amLODipine (NORVASC) 5 MG tablet Take by mouth.    ? amoxicillin (AMOXIL) 500 MG tablet Take by mouth.    ? aspirin EC 81 MG tablet Take 1 tablet (81 mg total) by mouth daily. Swallow whole. 150 tablet 2  ? atenolol (TENORMIN) 50 MG tablet Take 1 tablet (50 mg total) by mouth daily. 90 tablet 3  ? Biotin 5000 MCG CAPS Take 5,000 mcg by mouth daily.    ? Calcium Carbonate-Vitamin D (CALCIUM + D PO) Take 1 tablet by mouth daily.    ? Cholecalciferol 100 MCG (4000 UT) CAPS Take 1 capsule (4,000 Units total) by mouth daily. 30 capsule   ? cloNIDine  (CATAPRES) 0.1 MG tablet Take 1 tablet (0.1 mg total) by mouth 3 (three) times daily. 270 tablet 3  ? dorzolamide-timolol (COSOPT) 22.3-6.8 MG/ML ophthalmic solution Place 1 drop into both eyes 2 (two) times daily.    ? flecainide (TAMBOCOR) 50 MG tablet TAKE 1 TABLET BY MOUTH TWICE A DAY 180 tablet 3  ? hydrALAZINE (APRESOLINE) 100 MG tablet Take 1 tablet (100 mg total) by mouth 3 (three) times daily. 270 tablet 3  ? hydrochlorothiazide (MICROZIDE) 12.5 MG capsule Take 1 capsule (12.5 mg total) by mouth daily. 30 capsule 6  ? levothyroxine (SYNTHROID) 50 MCG tablet TAKE 1 TABLET BY MOUTH EVERY DAY 90 tablet 3  ? linaclotide (LINZESS) 72 MCG capsule Take 1 capsule (72 mcg total) by mouth daily. (Patient taking differently: Take 72 mcg by mouth daily as needed (constipation).) 30 capsule 11  ? losartan (COZAAR) 50 MG tablet Take 1 tablet (50 mg total) by mouth 2 (two) times daily. 180 tablet 3  ? rosuvastatin (CRESTOR) 10 MG tablet Take 1 tablet (10 mg  total) by mouth daily. 30 tablet 11  ? traZODone (DESYREL) 50 MG tablet TAKE 1/2 TO 1 TABLET BY MOUTH AT BEDTIME AS NEEDED FOR SLEEP 90 tablet 1  ? XARELTO 15 MG TABS tablet TAKE 1 TABLET BY MOUTH EVERY DAY WITH SUPPER 30 tablet 5  ? diltiazem (CARDIZEM CD) 240 MG 24 hr capsule Take by mouth. (Patient not taking: Reported on 05/29/2021)    ? ?No facility-administered medications prior to visit.  ? ? ? ?Allergies:   Patient has no known allergies.  ? ?Past Medical History:  ?Diagnosis Date  ? Anemia   ? Arthritis   ? Atrial fibrillation ([place])   ? Atrial flutter ([place])   ? s/p ablation  ? Atypical mole 09/07/2013  ? LOWER LEG SEVERE TX= WIDER SHAVE   ? HTN (hypertension)   ? Hypothyroidism   ? Nodular basal cell carcinoma (BCC) 03/29/2020  ? Right Malar Cheek  ? Palpitations   ? ? ?Past Surgical History:  ?Procedure Laterality Date  ? A FLUTTER ABLATION    ? KNEE ARTHROSCOPY Right   ? PERIPHERAL VASCULAR INTERVENTION Right 02/13/2021  ? Procedure: PERIPHERAL VASCULAR  INTERVENTION;  Surgeon: Serafina Mitchell, MD;  Location: [facility];  Service: Cardiovascular;  Laterality: Right;  ? RENAL ANGIOGRAPHY N/A 02/13/2021  ? Procedure: RENAL ANGIOGRAPHY;  Surgeon: Serafina Mitchell, MD;  Location: [facility];  Service: Cardiovascular;  Laterality: N/A;  ? TUBAL LIGATION    ? ? ? ?Social History:  The patient  reports that she has never smoked. She has never been exposed to tobacco smoke. She has never used smokeless tobacco. She reports that she does not drink alcohol and does not use drugs.  ? ?Family History:  The patient's family history includes Colon cancer in her brother; Heart disease in her mother; Stroke (age of onset: 64) in her mother; Stroke (age of onset: 30) in her father.  ? ? ?ROS:  Please see the history of present illness. All other systems are reviewed and  Negative to the above problem except as noted.  ? ? ?PHYSICAL EXAM: ?VS:  BP 140/66   Pulse (!) 59   Ht '5\' 7"'$  (1.702 m)   Wt 128 lb 10.1 oz (58.3 kg)   LMP  (LMP Unknown)   BMI 20.15 kg/m?     ? ?GEN: Thin 86 yo who is in NAD  ?HEENT: normal  ?Neck: JVP is normal    ?Cardiac: RRR; no murmurs   No LE edema  ?Respiratory:  clear to auscultation  ?GII: soft, nontender, nondistended, + BS  No hepatomegaly  ?MS: no deformity Moving all extremities   ?Skin: warm and dry, no rash ?Psych: euthymic mood, full affect ? ? ?EKG:  EKG not done  ? ?RA USN  ?Summary: ?Renal: ?Right: Abnormal right Resistive Index. Normal cortical thickness of right kidney. ?Probable occlusion of the right renal artery. ?Left: Abnormal left Resisitve Index. Normal cortical thickness of the left kidney. Normal size of ?left kidney. No evidence of left renal artery stenosis. LRV flow present. ?Mesenteric: ?Normal Celiac artery findings. 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 ? ? ?Lipid Panel ?   ?Component Value Date/Time  ? CHOL 219 (H) 10/12/2020 1617  ? TRIG 85 10/12/2020 1617  ? HDL 95 10/12/2020 1617  ?  CHOLHDL 2.3 10/12/2020 1617  ? CHOLHDL 2.0 06/05/2015 0946  ? VLDL 10 06/05/2015 0946  ? LDLCALC 109 (H) 10/12/2020 1617  ? LDLDIRECT 84.9  11/15/2008 0924  ? ?  ? ?Wt Readings from Last 3 Encounters:  ?05/29/21 128 lb 10.1 oz (58.3 kg)  ?04/23/21 131 lb (59.4 kg)  ?04/09/21 134 lb 3.2 oz (60.9 kg)  ?  ? ? ?ASSESSMENT AND PLAN: ? ?1  HTN   BP is improved   Continue meds  ? ?2  PAF Denies palpitations  Pt taking Xarelto as she wants   Understands risks   ? ? ?3 PAD  PT s/p PTA/stent to renal artery   Noted to have plaquing in mesenteric artery    Asymptomatic  BP improved  ?Does not want to be on statin    She has stopped ASA   ? ?4  Lipids  Off of statin   Will check lipdis today  ?Current medicines are reviewed at length with the patient today.  The patient does not have concerns regarding medicines. ? ?Signed, ?Dorris Carnes, MD  ?05/29/2021 10:56 AM    ?[facility] ?[address], [place], [place]  [address] ?Phone: [phone]; Fax: [phone]  ? ? ?

## 2021-05-29 NOTE — Patient Instructions (Signed)
Medication Instructions:  ?Your physician recommends that you continue on your current medications as directed. Please refer to the Current Medication list given to you today. ? ?*If you need a refill on your cardiac medications before your next appointment, please call your pharmacy* ? ? ?Lab Work: ?CBC, NMR, BMET ? ?If you have labs (blood work) drawn today and your tests are completely normal, you will receive your results only by: ?MyChart Message (if you have MyChart) OR ?A paper copy in the mail ?If you have any lab test that is abnormal or we need to change your treatment, we will call you to review the results. ? ? ?Testing/Procedures: ? ? ? ?Follow-Up: ?At [facility], you and your health needs are our priority.  As part of our continuing mission to provide you with exceptional heart care, we have created designated Provider Care Teams.  These Care Teams include your primary Cardiologist (physician) and Advanced Practice Providers (APPs -  Physician Assistants and Nurse Practitioners) who all work together to provide you with the care you need, when you need it. ? ?We recommend signing up for the patient portal called "MyChart".  Sign up information is provided on this After Visit Summary.  MyChart is used to connect with patients for Virtual Visits (Telemedicine).  Patients are able to view lab/test results, encounter notes, upcoming appointments, etc.  Non-urgent messages can be sent to your provider as well.   ?To learn more about what you can do with MyChart, go to [email or web address].   ? ?Your next appointment:   ?6 month(s) ? ?The format for your next appointment:   ?In Person ? ?Provider:   ?Dorris Carnes, MD   ? ? ?Other Instructions ? ? ?Important Information About Sugar ? ? ? ? ?  ?

## 2021-05-31 ENCOUNTER — Encounter: Payer: Self-pay | Admitting: Internal Medicine

## 2021-06-01 ENCOUNTER — Other Ambulatory Visit: Payer: Self-pay | Admitting: *Deleted

## 2021-06-01 DIAGNOSIS — E785 Hyperlipidemia, unspecified: Secondary | ICD-10-CM

## 2021-06-01 LAB — BASIC METABOLIC PANEL
BUN/Creatinine Ratio: 15 (ref 12–28)
BUN: 24 mg/dL (ref 8–27)
CO2: 23 mmol/L (ref 20–29)
Calcium: 9.4 mg/dL (ref 8.7–10.3)
Chloride: 100 mmol/L (ref 96–106)
Creatinine, Ser: 1.62 mg/dL — ABNORMAL HIGH (ref 0.57–1.00)
Glucose: 96 mg/dL (ref 70–99)
Potassium: 4.5 mmol/L (ref 3.5–5.2)
Sodium: 138 mmol/L (ref 134–144)
eGFR: 31 mL/min/{1.73_m2} — ABNORMAL LOW (ref >59)

## 2021-06-01 LAB — NMR, LIPOPROFILE

## 2021-06-01 LAB — CBC
Hematocrit: 30.5 % — ABNORMAL LOW (ref 34.0–46.6)
Hemoglobin: 10.3 g/dL — ABNORMAL LOW (ref 11.1–15.9)
MCH: 32.4 pg (ref 26.6–33.0)
MCHC: 33.8 g/dL (ref 31.5–35.7)
MCV: 96 fL (ref 79–97)
Platelets: 257 10*3/uL (ref 150–450)
RBC: 3.18 x10E6/uL — ABNORMAL LOW (ref 3.77–5.28)
RDW: 12.2 % (ref 11.7–15.4)
WBC: 5.9 10*3/uL (ref 3.4–10.8)

## 2021-06-05 ENCOUNTER — Other Ambulatory Visit: Payer: Medicare PPO | Admitting: *Deleted

## 2021-06-05 DIAGNOSIS — E785 Hyperlipidemia, unspecified: Secondary | ICD-10-CM

## 2021-06-06 ENCOUNTER — Encounter: Payer: Self-pay | Admitting: Internal Medicine

## 2021-06-07 ENCOUNTER — Other Ambulatory Visit: Payer: Medicare PPO

## 2021-06-07 LAB — NMR, LIPOPROFILE
Cholesterol, Total: 174 mg/dL (ref 100–199)
HDL Particle Number: 32 umol/L (ref >=30.5)
HDL-C: 80 mg/dL (ref >39)
LDL Particle Number: 826 nmol/L (ref <1000)
LDL Size: 21.4 nm (ref >20.5)
LDL-C (NIH Calc): 82 mg/dL (ref 0–99)
LP-IR Score: <25 (ref <=45)
Small LDL Particle Number: <90 nmol/L (ref <=527)
Triglycerides: 64 mg/dL (ref 0–149)

## 2021-06-12 ENCOUNTER — Ambulatory Visit: Payer: Medicare PPO | Admitting: Physician Assistant

## 2021-06-12 DIAGNOSIS — Z85828 Personal history of other malignant neoplasm of skin: Secondary | ICD-10-CM

## 2021-06-12 DIAGNOSIS — L820 Inflamed seborrheic keratosis: Secondary | ICD-10-CM

## 2021-06-12 DIAGNOSIS — D485 Neoplasm of uncertain behavior of skin: Secondary | ICD-10-CM | POA: Diagnosis not present

## 2021-06-20 ENCOUNTER — Encounter: Payer: Self-pay | Admitting: Physician Assistant

## 2021-06-20 NOTE — Progress Notes (Signed)
   Follow-Up Visit   Subjective  Amy [name] is a 86 y.o. female who presents for the following: Follow-up (Follow up on MOHS right malar cheek. Patient is doing well. Also check patients back. Has some crusty lesions that are irritated. History of non mole skin cancer.).   The following portions of the chart were reviewed this encounter and updated as appropriate:  Tobacco  Allergies  Meds  Problems  Med Hx  Surg Hx  Fam Hx      Objective  Well appearing patient in no apparent distress; mood and affect are within normal limits.  All skin waist up examined.  Left Upper Arm - Posterior Thick black crust     Left Lower Back Thick, black crust     Right Lower Back Thick black crust     Mid Back Stuck-on, waxy papules and plaques.    Assessment & Plan  Neoplasm of uncertain behavior of skin (3) Left Upper Arm - Posterior  Skin / nail biopsy Type of biopsy: tangential   Informed consent: discussed and consent obtained   Timeout: patient name, date of birth, surgical site, and procedure verified   Anesthesia: the lesion was anesthetized in a standard fashion   Anesthetic:  1% lidocaine w/ epinephrine 1-100,000 local infiltration Instrument used: flexible razor blade   Hemostasis achieved with: ferric subsulfate   Outcome: patient tolerated procedure well   Post-procedure details: wound care instructions given    Specimen 1 - Surgical pathology Differential Diagnosis: SK  Check Margins: No  Left Lower Back  Skin / nail biopsy Type of biopsy: tangential   Informed consent: discussed and consent obtained   Timeout: patient name, date of birth, surgical site, and procedure verified   Anesthesia: the lesion was anesthetized in a standard fashion   Anesthetic:  1% lidocaine w/ epinephrine 1-100,000 local infiltration Instrument used: flexible razor blade   Hemostasis achieved with: ferric subsulfate   Outcome: patient tolerated procedure well    Post-procedure details: wound care instructions given    Specimen 2 - Surgical pathology Differential Diagnosis: SK  Check Margins: No  Right Lower Back  Skin / nail biopsy Type of biopsy: tangential   Informed consent: discussed and consent obtained   Timeout: patient name, date of birth, surgical site, and procedure verified   Anesthesia: the lesion was anesthetized in a standard fashion   Anesthetic:  1% lidocaine w/ epinephrine 1-100,000 local infiltration Instrument used: flexible razor blade   Hemostasis achieved with: ferric subsulfate   Outcome: patient tolerated procedure well   Post-procedure details: wound care instructions given    Specimen 3 - Surgical pathology Differential Diagnosis: SK  Check Margins: No  Inflamed seborrheic keratosis Mid Back  Destruction of lesion - Mid Back Complexity: simple   Destruction method: cryotherapy   Informed consent: discussed and consent obtained   Timeout:  patient name, date of birth, surgical site, and procedure verified Lesion destroyed using liquid nitrogen: Yes   Cryotherapy cycles:  1 Outcome: patient tolerated procedure well with no complications   Post-procedure details: wound care instructions given    Moh's site is clear and healed with good cosmesis.   I, Wafa Martes, PA-C, have reviewed all documentation's for this visit.  The documentation on 06/20/21 for the exam, diagnosis, procedures and orders are all accurate and complete.

## 2021-06-26 ENCOUNTER — Ambulatory Visit (INDEPENDENT_AMBULATORY_CARE_PROVIDER_SITE_OTHER): Payer: Medicare PPO

## 2021-06-26 DIAGNOSIS — Z0000 Encounter for general adult medical examination without abnormal findings: Secondary | ICD-10-CM

## 2021-06-26 NOTE — Patient Instructions (Signed)
Amy [name] , Thank you for taking time to come for your Medicare Wellness Visit. I appreciate your ongoing commitment to your health goals. Please review the following plan we discussed and let me know if I can assist you in the future.   Screening recommendations/referrals: Colonoscopy: Not a candidate for screening due to age; last done 06/13/2017 Mammogram: Not a candidate for screening due to age; last done 06/05/2016 Bone Density: Not a candidate for screening due to age; last done 03/14/2018 Recommended yearly ophthalmology/optometry visit for glaucoma screening and checkup Recommended yearly dental visit for hygiene and checkup  Vaccinations: Influenza vaccine: declined Pneumococcal vaccine: declined Tdap vaccine: declined Shingles vaccine: declined   Covid-19: 02/14/2019, 03/08/2019, 11/20/2019  Advanced directives: Yes; Please bring a copy of your health care power of attorney and living will to the office at your convenience.  Conditions/risks identified: Yes  Next appointment: Please schedule your next Medicare Wellness Visit with your Nurse Health Advisor in 1 year by calling [phone].   Preventive Care 84 Years and Older, Female Preventive care refers to lifestyle choices and visits with your health care provider that can promote health and wellness. What does preventive care include? A yearly physical exam. This is also called an annual well check. Dental exams once or twice a year. Routine eye exams. Ask your health care provider how often you should have your eyes checked. Personal lifestyle choices, including: Daily care of your teeth and gums. Regular physical activity. Eating a healthy diet. Avoiding tobacco and drug use. Limiting alcohol use. Practicing safe sex. Taking low-dose aspirin every day. Taking vitamin and mineral supplements as recommended by your health care provider. What happens during an annual well check? The services and screenings done by  your health care provider during your annual well check will depend on your age, overall health, lifestyle risk factors, and family history of disease. Counseling  Your health care provider may ask you questions about your: Alcohol use. Tobacco use. Drug use. Emotional well-being. Home and relationship well-being. Sexual activity. Eating habits. History of falls. Memory and ability to understand (cognition). Work and work Statistician. Reproductive health. Screening  You may have the following tests or measurements: Height, weight, and BMI. Blood pressure. Lipid and cholesterol levels. These may be checked every 5 years, or more frequently if you are over 105 years old. Skin check. Lung cancer screening. You may have this screening every year starting at age 54 if you have a 30-pack-year history of smoking and currently smoke or have quit within the past 15 years. Fecal occult blood test (FOBT) of the stool. You may have this test every year starting at age 60. Flexible sigmoidoscopy or colonoscopy. You may have a sigmoidoscopy every 5 years or a colonoscopy every 10 years starting at age 38. Hepatitis C blood test. Hepatitis B blood test. Sexually transmitted disease (STD) testing. Diabetes screening. This is done by checking your blood sugar (glucose) after you have not eaten for a while (fasting). You may have this done every 1-3 years. Bone density scan. This is done to screen for osteoporosis. You may have this done starting at age 77. Mammogram. This may be done every 1-2 years. Talk to your health care provider about how often you should have regular mammograms. Talk with your health care provider about your test results, treatment options, and if necessary, the need for more tests. Vaccines  Your health care provider may recommend certain vaccines, such as: Influenza vaccine. This is recommended every year. Tetanus,  diphtheria, and acellular pertussis (Tdap, Td) vaccine. You  may need a Td booster every 10 years. Zoster vaccine. You may need this after age 28. Pneumococcal 13-valent conjugate (PCV13) vaccine. One dose is recommended after age 65. Pneumococcal polysaccharide (PPSV23) vaccine. One dose is recommended after age 4. Talk to your health care provider about which screenings and vaccines you need and how often you need them. This information is not intended to replace advice given to you by your health care provider. Make sure you discuss any questions you have with your health care provider. Document Released: 02/03/2015 Document Revised: 09/27/2015 Document Reviewed: 11/08/2014 Elsevier Interactive Patient Education  2017 [place] Prevention in the Home Falls can cause injuries. They can happen to people of all ages. There are many things you can do to make your home safe and to help prevent falls. What can I do on the outside of my home? Regularly fix the edges of walkways and driveways and fix any cracks. Remove anything that might make you trip as you walk through a door, such as a raised step or threshold. Trim any bushes or trees on the path to your home. Use bright outdoor lighting. Clear any walking paths of anything that might make someone trip, such as rocks or tools. Regularly check to see if handrails are loose or broken. Make sure that both sides of any steps have handrails. Any raised decks and porches should have guardrails on the edges. Have any leaves, snow, or ice cleared regularly. Use sand or salt on walking paths during winter. Clean up any spills in your garage right away. This includes oil or grease spills. What can I do in the bathroom? Use night lights. Install grab bars by the toilet and in the tub and shower. Do not use towel bars as grab bars. Use non-skid mats or decals in the tub or shower. If you need to sit down in the shower, use a plastic, non-slip stool. Keep the floor dry. Clean up any water that spills  on the floor as soon as it happens. Remove soap buildup in the tub or shower regularly. Attach bath mats securely with double-sided non-slip rug tape. Do not have throw rugs and other things on the floor that can make you trip. What can I do in the bedroom? Use night lights. Make sure that you have a light by your bed that is easy to reach. Do not use any sheets or blankets that are too big for your bed. They should not hang down onto the floor. Have a firm chair that has side arms. You can use this for support while you get dressed. Do not have throw rugs and other things on the floor that can make you trip. What can I do in the kitchen? Clean up any spills right away. Avoid walking on wet floors. Keep items that you use a lot in easy-to-reach places. If you need to reach something above you, use a strong step stool that has a grab bar. Keep electrical cords out of the way. Do not use floor polish or wax that makes floors slippery. If you must use wax, use non-skid floor wax. Do not have throw rugs and other things on the floor that can make you trip. What can I do with my stairs? Do not leave any items on the stairs. Make sure that there are handrails on both sides of the stairs and use them. Fix handrails that are broken or loose. Make  sure that handrails are as long as the stairways. Check any carpeting to make sure that it is firmly attached to the stairs. Fix any carpet that is loose or worn. Avoid having throw rugs at the top or bottom of the stairs. If you do have throw rugs, attach them to the floor with carpet tape. Make sure that you have a light switch at the top of the stairs and the bottom of the stairs. If you do not have them, ask someone to add them for you. What else can I do to help prevent falls? Wear shoes that: Do not have high heels. Have rubber bottoms. Are comfortable and fit you well. Are closed at the toe. Do not wear sandals. If you use a stepladder: Make  sure that it is fully opened. Do not climb a closed stepladder. Make sure that both sides of the stepladder are locked into place. Ask someone to hold it for you, if possible. Clearly mark and make sure that you can see: Any grab bars or handrails. First and last steps. Where the edge of each step is. Use tools that help you move around (mobility aids) if they are needed. These include: Canes. Walkers. Scooters. Crutches. Turn on the lights when you go into a dark area. Replace any light bulbs as soon as they burn out. Set up your furniture so you have a clear path. Avoid moving your furniture around. If any of your floors are uneven, fix them. If there are any pets around you, be aware of where they are. Review your medicines with your doctor. Some medicines can make you feel dizzy. This can increase your chance of falling. Ask your doctor what other things that you can do to help prevent falls. This information is not intended to replace advice given to you by your health care provider. Make sure you discuss any questions you have with your health care provider. Document Released: 11/03/2008 Document Revised: 06/15/2015 Document Reviewed: 02/11/2014 Elsevier Interactive Patient Education  2017 [employer].

## 2021-06-26 NOTE — Progress Notes (Signed)
I connected with Amy [name] today by telephone and verified that I am speaking with the correct person using two identifiers. Location patient: home Location provider: work Persons participating in the virtual visit: patient, provider.   I discussed the limitations, risks, security and privacy concerns of performing an evaluation and management service by telephone and the availability of in person appointments. I also discussed with the patient that there may be a patient responsible charge related to this service. The patient expressed understanding and verbally consented to this telephonic visit.    Interactive audio and video telecommunications were attempted between this provider and patient, however failed, due to patient having technical difficulties OR patient did not have access to video capability.  We continued and completed visit with audio only.  Some vital signs may be absent or patient reported.   Time Spent with patient on telephone encounter: 30 minutes  Subjective:   Amy [name] is a 86 y.o. female who presents for Medicare Annual (Subsequent) preventive examination.  Review of Systems     Cardiac Risk Factors include: advanced age (>61mn, >>41women);dyslipidemia;family history of premature cardiovascular disease;hypertension     Objective:    There were no vitals filed for this visit. There is no height or weight on file to calculate BMI.     06/26/2021    4:06 PM 02/13/2021    9:51 AM 06/13/2017    2:57 PM  Advanced Directives  Does Patient Have a Medical Advance Directive? Yes No No  Type of Advance Directive Living will;Healthcare Power of Attorney    Does patient want to make changes to medical advance directive? No - Patient declined    Copy of [place] Chart? No - copy requested    Would patient like information on creating a medical advance directive?  No - Patient declined     Current Medications (verified) Outpatient  Encounter Medications as of 06/26/2021  Medication Sig   acetaminophen (TYLENOL) 650 MG CR tablet Take 1,300 mg by mouth 2 (two) times daily as needed for pain.   alclomethasone (ACLOVATE) 0.05 % cream Apply topically at bedtime.   AMBULATORY NON FORMULARY MEDICATION Take 1 drop by mouth daily as needed (pain). CBD Oil 1 dose under tongue once daily as needed for pain   amLODipine (NORVASC) 2.5 MG tablet Take 1 tablet (2.5 mg total) by mouth 2 (two) times daily.   amLODipine (NORVASC) 5 MG tablet Take by mouth.   amoxicillin (AMOXIL) 500 MG tablet Take by mouth.   aspirin EC 81 MG tablet Take 1 tablet (81 mg total) by mouth daily. Swallow whole.   atenolol (TENORMIN) 50 MG tablet Take 1 tablet (50 mg total) by mouth daily.   Biotin 5000 MCG CAPS Take 5,000 mcg by mouth daily.   Calcium Carbonate-Vitamin D (CALCIUM + D PO) Take 1 tablet by mouth daily.   Cholecalciferol 100 MCG (4000 UT) CAPS Take 1 capsule (4,000 Units total) by mouth daily.   cloNIDine (CATAPRES) 0.1 MG tablet Take 1 tablet (0.1 mg total) by mouth 3 (three) times daily.   diltiazem (CARDIZEM CD) 240 MG 24 hr capsule Take by mouth. (Patient not taking: Reported on 05/29/2021)   dorzolamide-timolol (COSOPT) 22.3-6.8 MG/ML ophthalmic solution Place 1 drop into both eyes 2 (two) times daily.   flecainide (TAMBOCOR) 50 MG tablet TAKE 1 TABLET BY MOUTH TWICE A DAY   hydrALAZINE (APRESOLINE) 100 MG tablet Take 1 tablet (100 mg total) by mouth 3 (three) times  daily.   hydrochlorothiazide (MICROZIDE) 12.5 MG capsule Take 1 capsule (12.5 mg total) by mouth daily.   levothyroxine (SYNTHROID) 50 MCG tablet TAKE 1 TABLET BY MOUTH EVERY DAY   linaclotide (LINZESS) 72 MCG capsule Take 1 capsule (72 mcg total) by mouth daily. (Patient taking differently: Take 72 mcg by mouth daily as needed (constipation).)   losartan (COZAAR) 50 MG tablet Take 1 tablet (50 mg total) by mouth 2 (two) times daily.   rosuvastatin (CRESTOR) 10 MG tablet Take 1  tablet (10 mg total) by mouth daily.   traZODone (DESYREL) 50 MG tablet TAKE 1/2 TO 1 TABLET BY MOUTH AT BEDTIME AS NEEDED FOR SLEEP   XARELTO 15 MG TABS tablet TAKE 1 TABLET BY MOUTH EVERY DAY WITH SUPPER   No facility-administered encounter medications on file as of 06/26/2021.    Allergies (verified) Patient has no known allergies.   History: 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   Right Malar Cheek   Palpitations    Past Surgical History:  Procedure Laterality Date   A FLUTTER ABLATION     KNEE ARTHROSCOPY Right    PERIPHERAL VASCULAR INTERVENTION Right 02/13/2021   Procedure: PERIPHERAL VASCULAR INTERVENTION;  Surgeon: Serafina Mitchell, MD;  Location: [facility];  Service: Cardiovascular;  Laterality: Right;   RENAL ANGIOGRAPHY N/A 02/13/2021   Procedure: RENAL ANGIOGRAPHY;  Surgeon: Serafina Mitchell, MD;  Location: [facility];  Service: Cardiovascular;  Laterality: N/A;   TUBAL LIGATION     Family History  Problem Relation Age of Onset   Stroke Father 80       died from stroke at age 26   Stroke Mother 23       died from stroke at age 56   Heart disease Mother    Colon cancer Brother    Social History   Socioeconomic History   Marital status: Married    Spouse name: Not on file   Number of children: 1   Years of education: Not on file   Highest education level: Not on file  Occupational History   Occupation: Retired  Tobacco Use   Smoking status: Never    Passive exposure: Never   Smokeless tobacco: Never  Vaping Use   Vaping Use: Never used  Substance and Sexual Activity   Alcohol use: No   Drug use: No   Sexual activity: Not on file  Other Topics Concern   Not on file  Social History Narrative    Occ caffeine    Social Determinants of Health   Financial Resource Strain: Low Risk    Difficulty of Paying Living Expenses: Not hard at all  Food Insecurity: No Food Insecurity   Worried About Charity fundraiser in the Last Year: Never true   [place] in the Last Year: Never true  Transportation Needs: No Transportation Needs   Lack of Transportation (Medical): No   Lack of Transportation (Non-Medical): No  Physical Activity: Sufficiently Active   Days of Exercise per Week: 5 days   Minutes of Exercise per Session: 30 min  Stress: No Stress Concern Present   Feeling of Stress :  Not at all  Social Connections: Socially Integrated   Frequency of Communication with Friends and Family: More than three times a week   Frequency of Social Gatherings with Friends and Family: More than three times a week   Attends Religious Services: More than 4 times per year   Active Member of [employer] or Organizations: Yes   Attends Music therapist: More than 4 times per year   Marital Status: Married    Tobacco Counseling Counseling given: Not Answered   Clinical Intake:  Pre-visit preparation completed: Yes  Pain : No/denies pain     BMI - recorded: 20.15 Nutritional Status: BMI of 19-24  Normal Nutritional Risks: None Diabetes: No  How often do you need to have someone help you when you read instructions, pamphlets, or other written materials from your doctor or pharmacy?: 1 - Never What is the last grade level you completed in school?: HSG  Diabetic? no  Interpreter Needed?: No  Information entered by :: Lisette Abu, LPN.   Activities of Daily Living    06/26/2021    4:11 PM 11/23/2020   11:11 AM  In your present state of health, do you have any difficulty performing the following activities:  Hearing? 0 0  Vision? 0 0  Difficulty concentrating or making decisions? 0 0  Walking or climbing stairs? 0 0  Dressing or bathing? 0 0  Doing errands,  shopping? 0 0  Preparing Food and eating ? N   Using the Toilet? N   In the past six months, have you accidently leaked urine? N   Do you have problems with loss of bowel control? N   Managing your Medications? N   Managing your Finances? N   Housekeeping or managing your Housekeeping? N     Patient Care Team: Binnie Rail, MD as PCP - General (Internal Medicine) Fay Records, MD as PCP - Cardiology (Cardiology) Warren Danes, PA-C as Physician Assistant (Dermatology) Lisabeth Pick, MD as Consulting Physician (Ophthalmology)  Indicate any recent Medical Services you may have received from other than [facility] providers in the past year (date may be approximate).     Assessment:   This is a routine wellness examination for Jamekia.  Hearing/Vision screen Hearing Screening - Comments:: Patient denied any hearing difficulty.   No hearing aids.  Vision Screening - Comments:: Patient does not wear any corrective lenses/contacts.   Lens implants done. Eye exam done by: Arnoldo Hooker, MD   Dietary issues and exercise activities discussed: Current Exercise Habits: Home exercise routine, Type of exercise: walking, Time (Minutes): 30, Frequency (Times/Week): 5, Weekly Exercise (Minutes/Week): 150, Intensity: Moderate   Goals Addressed             This Visit's Progress    My goal is to stay well and do more exercising.        Depression Screen    06/26/2021    4:10 PM 11/23/2020   12:26 PM 11/23/2019   10:22 AM 03/25/2018    5:48 PM 07/10/2017    3:31 PM 04/08/2017    8:23 AM 04/02/2017    2:54 PM  PHQ 2/9 Scores  PHQ - 2 Score 0 0 0 0 0 0 0  PHQ- 9 Score  0         Fall Risk    06/26/2021    4:08 PM 11/23/2020   11:05 AM 11/23/2019   10:16 AM 03/25/2018    5:48 PM 07/10/2017  3:31 PM  [place] in the past year? 0 0 0 0 No  Number falls in past yr: 0 0 0    Injury with Fall? 0 0 0    Risk for fall due to : No Fall Risks No Fall Risks No Fall Risks    Follow up  Falls evaluation completed Falls evaluation completed Falls evaluation completed      [place]:  Any stairs in or around the home? Yes  If so, are there any without handrails? No  Home free of loose throw rugs in walkways, pet beds, electrical cords, etc? Yes  Adequate lighting in your home to reduce risk of falls? Yes   ASSISTIVE DEVICES UTILIZED TO PREVENT FALLS:  Life alert? No  Use of a cane, walker or w/c? No  Grab bars in the bathroom? No  Shower chair or bench in shower? No  Elevated toilet seat or a handicapped toilet? No   TIMED UP AND GO:  Was the test performed? No .  Length of time to ambulate 10 feet: n/a sec.   Appearance of gait: Patient not evaluated for gait during this visit.  Cognitive Function:        06/26/2021    4:17 PM  6CIT Screen  What Year? 0 points  What month? 0 points  What time? 0 points  Count back from 20 0 points  Months in reverse 0 points  Repeat phrase 0 points  Total Score 0 points    Immunizations Immunization History  Administered Date(s) Administered   PFIZER(Purple Top)SARS-COV-2 Vaccination 02/14/2019, 03/08/2019, 11/20/2019    TDAP status: declined  Flu Vaccine status: Declined, Education has been provided regarding the importance of this vaccine but patient still declined. Advised may receive this vaccine at local pharmacy or Health Dept. Aware to provide a copy of the vaccination record if obtained from local pharmacy or Health Dept. Verbalized acceptance and understanding.  Pneumococcal vaccine status: Declined,  Education has been provided regarding the importance of this vaccine but patient still declined. Advised may receive this vaccine at local pharmacy or Health Dept. Aware to provide a copy of the vaccination record if obtained from local pharmacy or Health Dept. Verbalized acceptance and understanding.   Covid-19 vaccine status: Completed vaccines  Qualifies for Shingles  Vaccine? Yes   Zostavax completed No   Shingrix Completed?: No.    Education has been provided regarding the importance of this vaccine. Patient has been advised to call insurance company to determine out of pocket expense if they have not yet received this vaccine. Advised may also receive vaccine at local pharmacy or Health Dept. Verbalized acceptance and understanding.  Screening Tests Health Maintenance  Topic Date Due   Zoster Vaccines- Shingrix (1 of 2) Never done   COVID-19 Vaccine (4 - Booster for Pfizer series) 01/15/2020   Pneumonia Vaccine 61+ Years old (1 - PCV) 11/23/2021 (Originally 07/28/2002)   TETANUS/TDAP  11/23/2021 (Originally 07/27/1954)   COLONOSCOPY (Pts 45-63yr Insurance coverage will need to be confirmed)  06/14/2022   HPV VACCINES  Aged Out   INFLUENZA VACCINE  Discontinued   DEXA SCAN  Discontinued    Health Maintenance  Health Maintenance Due  Topic Date Due   Zoster Vaccines- Shingrix (1 of 2) Never done   COVID-19 Vaccine (4 - Booster for Pfizer series) 01/15/2020    Colorectal cancer screening: No longer required.   Mammogram status: No longer required due to age.  Bone Density status: Completed 03/14/2018. Results reflect: Bone density results: OSTEOPOROSIS. Repeat every 0 years.  Lung Cancer Screening: (Low Dose CT Chest recommended if Age 16-80 years, 30 pack-year currently smoking OR have quit w/in 15years.) does not qualify.   Lung Cancer Screening Referral: no  Additional Screening:  Hepatitis C Screening: does not qualify; Completed no  Vision Screening: Recommended annual ophthalmology exams for early detection of glaucoma and other disorders of the eye. Is the patient up to date with their annual eye exam?  Yes  Who is the provider or what is the name of the office in which the patient attends annual eye exams? [facility] If pt is not established with a provider, would they like to be referred to a provider to establish care? No  .   Dental Screening: Recommended annual dental exams for proper oral hygiene  Community Resource Referral / Chronic Care Management: CRR required this visit?  No   CCM required this visit?  No      Plan:     I have personally reviewed and noted the following in the patient's chart:   Medical and social history Use of alcohol, tobacco or illicit drugs  Current medications and supplements including opioid prescriptions.  Functional ability and status Nutritional status Physical activity Advanced directives List of other physicians Hospitalizations, surgeries, and ER visits in previous 12 months Vitals Screenings to include cognitive, depression, and falls Referrals and appointments  In addition, I have reviewed and discussed with patient certain preventive protocols, quality metrics, and best practice recommendations. A written personalized care plan for preventive services as well as general preventive health recommendations were provided to patient.     Sheral Flow, LPN   1/0/2585   Nurse Notes:  There were no vitals filed for this visit. There is no height or weight on file to calculate BMI. Patient stated that she has no issues with gait or balance; does not use any assistive devices. Medications reviewed with patient; no opioid use noted.

## 2021-09-19 ENCOUNTER — Encounter: Payer: Self-pay | Admitting: Internal Medicine

## 2021-09-19 NOTE — Progress Notes (Deleted)
Subjective:    Patient ID: Amy [name], female    DOB: Oct 04, 1935, 86 y.o.   MRN: [id number]      HPI Callia is here for No chief complaint on file.    Increased stress, no appetite -   Change to remeron or add lexapro  Medications and allergies reviewed with patient and updated if appropriate.  Current Outpatient Medications on File Prior to Visit  Medication Sig Dispense Refill   acetaminophen (TYLENOL) 650 MG CR tablet Take 1,300 mg by mouth 2 (two) times daily as needed for pain.     alclomethasone (ACLOVATE) 0.05 % cream Apply topically at bedtime. 60 g 0   AMBULATORY NON FORMULARY MEDICATION Take 1 drop by mouth daily as needed (pain). CBD Oil 1 dose under tongue once daily as needed for pain     amLODipine (NORVASC) 2.5 MG tablet Take 1 tablet (2.5 mg total) by mouth 2 (two) times daily. 180 tablet 3   amLODipine (NORVASC) 5 MG tablet Take by mouth.     amoxicillin (AMOXIL) 500 MG tablet Take by mouth.     aspirin EC 81 MG tablet Take 1 tablet (81 mg total) by mouth daily. Swallow whole. 150 tablet 2   atenolol (TENORMIN) 50 MG tablet Take 1 tablet (50 mg total) by mouth daily. 90 tablet 3   Biotin 5000 MCG CAPS Take 5,000 mcg by mouth daily.     Calcium Carbonate-Vitamin D (CALCIUM + D PO) Take 1 tablet by mouth daily.     Cholecalciferol 100 MCG (4000 UT) CAPS Take 1 capsule (4,000 Units total) by mouth daily. 30 capsule    cloNIDine (CATAPRES) 0.1 MG tablet Take 1 tablet (0.1 mg total) by mouth 3 (three) times daily. 270 tablet 3   diltiazem (CARDIZEM CD) 240 MG 24 hr capsule Take by mouth. (Patient not taking: Reported on 05/29/2021)     dorzolamide-timolol (COSOPT) 22.3-6.8 MG/ML ophthalmic solution Place 1 drop into both eyes 2 (two) times daily.     flecainide (TAMBOCOR) 50 MG tablet TAKE 1 TABLET BY MOUTH TWICE A DAY 180 tablet 3   hydrALAZINE (APRESOLINE) 100 MG tablet Take 1 tablet (100 mg total) by mouth 3 (three) times daily. 270 tablet 3    hydrochlorothiazide (MICROZIDE) 12.5 MG capsule Take 1 capsule (12.5 mg total) by mouth daily. 30 capsule 6   levothyroxine (SYNTHROID) 50 MCG tablet TAKE 1 TABLET BY MOUTH EVERY DAY 90 tablet 3   linaclotide (LINZESS) 72 MCG capsule Take 1 capsule (72 mcg total) by mouth daily. (Patient taking differently: Take 72 mcg by mouth daily as needed (constipation).) 30 capsule 11   losartan (COZAAR) 50 MG tablet Take 1 tablet (50 mg total) by mouth 2 (two) times daily. 180 tablet 3   rosuvastatin (CRESTOR) 10 MG tablet Take 1 tablet (10 mg total) by mouth daily. 30 tablet 11   traZODone (DESYREL) 50 MG tablet TAKE 1/2 TO 1 TABLET BY MOUTH AT BEDTIME AS NEEDED FOR SLEEP 90 tablet 1   XARELTO 15 MG TABS tablet TAKE 1 TABLET BY MOUTH EVERY DAY WITH SUPPER 30 tablet 5   No current facility-administered medications on file prior to visit.    Review of Systems     Objective:  There were no vitals filed for this visit. BP Readings from Last 3 Encounters:  05/29/21 140/66  04/23/21 (!) 156/60  04/09/21 (!) 160/76   Wt Readings from Last 3 Encounters:  05/29/21 128 lb 10.1 oz (58.3 kg)  04/23/21 131 lb (59.4 kg)  04/09/21 134 lb 3.2 oz (60.9 kg)   There is no height or weight on file to calculate BMI.    Physical Exam         Assessment & Plan:    See Problem List for Assessment and Plan of chronic medical problems.

## 2021-09-20 ENCOUNTER — Ambulatory Visit: Payer: Medicare PPO | Admitting: Internal Medicine

## 2021-09-29 NOTE — Progress Notes (Unsigned)
Subjective:    Patient ID: Amy [name], female    DOB: Sep 29, 1935, 86 y.o.   MRN: [id number]      HPI [name] is here for No chief complaint on file.    Increased stress, no appetite -   Change to remeron or add lexapro  Medications and allergies reviewed with patient and updated if appropriate.  Current Outpatient Medications on File Prior to Visit  Medication Sig Dispense Refill   acetaminophen (TYLENOL) 650 MG CR tablet Take 1,300 mg by mouth 2 (two) times daily as needed for pain.     alclomethasone (ACLOVATE) 0.05 % cream Apply topically at bedtime. 60 g 0   AMBULATORY NON FORMULARY MEDICATION Take 1 drop by mouth daily as needed (pain). CBD Oil 1 dose under tongue once daily as needed for pain     amLODipine (NORVASC) 2.5 MG tablet Take 1 tablet (2.5 mg total) by mouth 2 (two) times daily. 180 tablet 3   amLODipine (NORVASC) 5 MG tablet Take by mouth.     amoxicillin (AMOXIL) 500 MG tablet Take by mouth.     aspirin EC 81 MG tablet Take 1 tablet (81 mg total) by mouth daily. Swallow whole. 150 tablet 2   atenolol (TENORMIN) 50 MG tablet Take 1 tablet (50 mg total) by mouth daily. 90 tablet 3   Biotin 5000 MCG CAPS Take 5,000 mcg by mouth daily.     Calcium Carbonate-Vitamin D (CALCIUM + D PO) Take 1 tablet by mouth daily.     Cholecalciferol 100 MCG (4000 UT) CAPS Take 1 capsule (4,000 Units total) by mouth daily. 30 capsule    cloNIDine (CATAPRES) 0.1 MG tablet Take 1 tablet (0.1 mg total) by mouth 3 (three) times daily. 270 tablet 3   diltiazem (CARDIZEM CD) 240 MG 24 hr capsule Take by mouth. (Patient not taking: Reported on 05/29/2021)     dorzolamide-timolol (COSOPT) 22.3-6.8 MG/ML ophthalmic solution Place 1 drop into both eyes 2 (two) times daily.     flecainide (TAMBOCOR) 50 MG tablet TAKE 1 TABLET BY MOUTH TWICE A DAY 180 tablet 3   hydrALAZINE (APRESOLINE) 100 MG tablet Take 1 tablet (100 mg total) by mouth 3 (three) times daily. 270 tablet 3    hydrochlorothiazide (MICROZIDE) 12.5 MG capsule Take 1 capsule (12.5 mg total) by mouth daily. 30 capsule 6   levothyroxine (SYNTHROID) 50 MCG tablet TAKE 1 TABLET BY MOUTH EVERY DAY 90 tablet 3   linaclotide (LINZESS) 72 MCG capsule Take 1 capsule (72 mcg total) by mouth daily. (Patient taking differently: Take 72 mcg by mouth daily as needed (constipation).) 30 capsule 11   losartan (COZAAR) 50 MG tablet Take 1 tablet (50 mg total) by mouth 2 (two) times daily. 180 tablet 3   rosuvastatin (CRESTOR) 10 MG tablet Take 1 tablet (10 mg total) by mouth daily. 30 tablet 11   traZODone (DESYREL) 50 MG tablet TAKE 1/2 TO 1 TABLET BY MOUTH AT BEDTIME AS NEEDED FOR SLEEP 90 tablet 1   XARELTO 15 MG TABS tablet TAKE 1 TABLET BY MOUTH EVERY DAY WITH SUPPER 30 tablet 5   No current facility-administered medications on file prior to visit.    Review of Systems     Objective:  There were no vitals filed for this visit. BP Readings from Last 3 Encounters:  05/29/21 140/66  04/23/21 (!) 156/60  04/09/21 (!) 160/76   Wt Readings from Last 3 Encounters:  05/29/21 128 lb 10.1 oz (58.3 kg)  04/23/21 131 lb (59.4 kg)  04/09/21 134 lb 3.2 oz (60.9 kg)   There is no height or weight on file to calculate BMI.    Physical Exam         Assessment & Plan:    See Problem List for Assessment and Plan of chronic medical problems.

## 2021-10-02 ENCOUNTER — Ambulatory Visit: Payer: Medicare PPO | Admitting: Internal Medicine

## 2021-10-02 ENCOUNTER — Encounter: Payer: Self-pay | Admitting: Internal Medicine

## 2021-10-02 DIAGNOSIS — F419 Anxiety disorder, unspecified: Secondary | ICD-10-CM | POA: Diagnosis not present

## 2021-10-02 DIAGNOSIS — F32A Depression, unspecified: Secondary | ICD-10-CM | POA: Insufficient documentation

## 2021-10-02 MED ORDER — MIRTAZAPINE 7.5 MG PO TABS: 7.5000 mg | ORAL_TABLET | Freq: Every day | ORAL | 5 refills | Status: DC

## 2021-10-02 NOTE — Patient Instructions (Addendum)
      Medications changes include :  mirtazapine 7.5 mg bedtime.  Do not take the tylenol pm for now.      Your prescription(s) have been sent to your pharmacy.      Return in about 4 weeks (around 10/30/2021) for follow up.

## 2021-10-02 NOTE — Assessment & Plan Note (Signed)
Acute Experiencing increased stress and some depression Most of it is related to her husband being in the hospital over the past year and his most recent hospitalization he is not recovered from quickly and is still not feeling well She admits to depression and anxiety She is having decreased appetite, weight loss, decreased motivation, fatigue Stop Tylenol PM at nighttime Start mirtazapine 7.5 mg nightly hopefully she will tolerate this well and will help with sleep, appetite and anxiety/depression Follow-up in 1 month-she will contact me sooner if she has any side effects or questions

## 2021-10-09 NOTE — Progress Notes (Unsigned)
Cardiology Office Note   Date:  10/10/2021   ID:  Amy [name], DOB 1935-04-23, MRN [id number]  PCP:  Binnie Rail, MD  Cardiologist:   Dorris Carnes, MD    Pt presents for follow up of HTN  and PAF   History of Present Illness: Amy [name] is a 86 y.o. female with a history of atrial flutter (s/p ablation), PAF and HTN  In Dec 2022 the pt developed severe elevations of BP    198/    Iniitially meds increased    RA USN done  Showed occlusion of R renal artery   Seen by Pierre Bali   Underwent PTA / stent  02/13/21.  After procedure the Amlodipine increased to 2.5 bid    Cut back to daily due to swelling      I saw the pt in clinic in May 2023  Since seen she says her BP is 110s to 140s at home     Breathing is OK   No palpitations   No CP   Outpatient Medications Prior to Visit  Medication Sig Dispense Refill   acetaminophen (TYLENOL) 650 MG CR tablet Take 1,300 mg by mouth 2 (two) times daily as needed for pain.     alclomethasone (ACLOVATE) 0.05 % cream Apply topically at bedtime. 60 g 0   AMBULATORY NON FORMULARY MEDICATION Take 1 drop by mouth daily as needed (pain). CBD Oil 1 dose under tongue once daily as needed for pain     amLODipine (NORVASC) 2.5 MG tablet Take 1 tablet (2.5 mg total) by mouth 2 (two) times daily. 180 tablet 3   amoxicillin (AMOXIL) 500 MG tablet Take by mouth.     atenolol (TENORMIN) 50 MG tablet Take 1 tablet (50 mg total) by mouth daily. 90 tablet 3   Biotin 5000 MCG CAPS Take 5,000 mcg by mouth daily.     Calcium Carbonate-Vitamin D (CALCIUM + D PO) Take 1 tablet by mouth daily.     Cholecalciferol 100 MCG (4000 UT) CAPS Take 1 capsule (4,000 Units total) by mouth daily. 30 capsule    cloNIDine (CATAPRES) 0.1 MG tablet Take 1 tablet (0.1 mg total) by mouth 3 (three) times daily. 270 tablet 3   dorzolamide-timolol (COSOPT) 22.3-6.8 MG/ML ophthalmic solution Place 1 drop into both eyes 2 (two) times daily.     flecainide (TAMBOCOR) 50 MG tablet TAKE 1  TABLET BY MOUTH TWICE A DAY 180 tablet 3   hydrALAZINE (APRESOLINE) 100 MG tablet Take 1 tablet (100 mg total) by mouth 3 (three) times daily. 270 tablet 3   levothyroxine (SYNTHROID) 50 MCG tablet TAKE 1 TABLET BY MOUTH EVERY DAY 90 tablet 3   linaclotide (LINZESS) 72 MCG capsule Take 1 capsule (72 mcg total) by mouth daily. 30 capsule 11   losartan (COZAAR) 50 MG tablet Take 1 tablet (50 mg total) by mouth 2 (two) times daily. 180 tablet 3   mirtazapine (REMERON) 7.5 MG tablet Take 1 tablet (7.5 mg total) by mouth at bedtime. 30 tablet 5   XARELTO 15 MG TABS tablet TAKE 1 TABLET BY MOUTH EVERY DAY WITH SUPPER 30 tablet 5   aspirin EC 81 MG tablet Take 1 tablet (81 mg total) by mouth daily. Swallow whole. (Patient not taking: Reported on 10/10/2021) 150 tablet 2   diltiazem (CARDIZEM CD) 240 MG 24 hr capsule Take by mouth. (Patient not taking: Reported on 10/10/2021)     hydrochlorothiazide (MICROZIDE) 12.5 MG capsule Take 1  capsule (12.5 mg total) by mouth daily. (Patient not taking: Reported on 10/10/2021) 30 capsule 6   rosuvastatin (CRESTOR) 10 MG tablet Take 1 tablet (10 mg total) by mouth daily. (Patient not taking: Reported on 10/10/2021) 30 tablet 11   No facility-administered medications prior to visit.     Allergies:   Patient has no known allergies.   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   Right Malar Cheek   Palpitations     Past Surgical History:  Procedure Laterality Date   A FLUTTER ABLATION     KNEE ARTHROSCOPY Right    PERIPHERAL VASCULAR INTERVENTION Right 02/13/2021   Procedure: PERIPHERAL VASCULAR INTERVENTION;  Surgeon: Serafina Mitchell, MD;  Location: [facility];  Service: Cardiovascular;  Laterality: Right;    RENAL ANGIOGRAPHY N/A 02/13/2021   Procedure: RENAL ANGIOGRAPHY;  Surgeon: Serafina Mitchell, MD;  Location: [facility];  Service: Cardiovascular;  Laterality: N/A;   TUBAL LIGATION       Social History:  The patient  reports that she has never smoked. She has never been exposed to tobacco smoke. She has never used smokeless tobacco. She reports that she does not drink alcohol and does not use drugs.   Family History:  The patient's family history includes Colon cancer in her brother; Heart disease in her mother; Stroke (age of onset: 48) in her mother; Stroke (age of onset: 81) in her father.    ROS:  Please see the history of present illness. All other systems are reviewed and  Negative to the above problem except as noted.    PHYSICAL EXAM: VS:  BP (!) 165/85   Pulse 60   Ht '5\' 7"'$  (1.702 m)   Wt 123 lb (55.8 kg)   LMP  (LMP Unknown)   SpO2 96%   BMI 19.26 kg/m       HYPERTENSION CONTROL Vitals:   10/10/21 1550 10/10/21 1756  BP: (!) 150/60 (!) 165/85    The patient's blood pressure is elevated above target today.  In order to address the patient's elevated BP: Blood pressure will be monitored at home to determine if medication changes need to be made.      GEN: Thin 86 yo who is in NAD  HEENT: normal  Neck: JVP is normal    Cardiac: RRR; no murmurs   No LE edema  Respiratory:  clear to auscultation  GII: soft, nontender, nondistended, + BS  No hepatomegaly  MS: no deformity Moving all extremities   Skin: warm and dry, no rash Psych: euthymic mood, full affect   EKG:  EKG not done   RA USN  Summary: Renal: Right: Abnormal right Resistive Index. Normal cortical thickness of right kidney. Probable occlusion of the right renal artery. Left: Abnormal left Resisitve Index. Normal cortical thickness of the left kidney. Normal size of left kidney. No evidence of left renal artery stenosis. LRV flow present. Mesenteric: Normal Celiac artery findings. 70 to  99% stenosis in the superior mesenteric artery.   Lipid Panel    Component Value Date/Time   CHOL 219 (H) 10/12/2020 1617   TRIG 85 10/12/2020 1617   HDL 95  10/12/2020 1617   CHOLHDL 2.3 10/12/2020 1617   CHOLHDL 2.0 06/05/2015 0946   VLDL 10 06/05/2015 0946   LDLCALC 109 (H) 10/12/2020 1617   LDLDIRECT 84.9 11/15/2008 0924      Wt Readings from Last 3 Encounters:  10/10/21 123 lb (55.8 kg)  10/02/21 122 lb 9.6 oz (55.6 kg)  05/29/21 128 lb 10.1 oz (58.3 kg)      ASSESSMENT AND PLAN:  1  HTN   BP is high here   She says it is better at home    Her BP has been labile   She is sensitive and anxious about meds Follow BP    Take cuff and log to Dr Quay Burow office when she is seen in 1 month   2  PAF Denies palpitations  Continue flecanide   Pt taking 15 Xarelto every other day  She understands not as prescribed, understands potential risk   Check BMET   3 PAD  PT s/p PTA/stent to renal artery   Noted to have plaquing in mesenteric artery    Asymptomatic  BP improved  Pt not on statin   Does not want to be   Watch diet    She has also stopped ASA    4  Lipids  Off of statin   Will check lipomed   Current medicines are reviewed at length with the patient today.  The patient does not have concerns regarding medicines.  Signed, Dorris Carnes, MD  10/10/2021 5:57 PM    [address], [place], [place]  [address] Phone: [phone]; Fax: [phone]

## 2021-10-10 ENCOUNTER — Ambulatory Visit: Payer: Medicare PPO | Attending: Internal Medicine | Admitting: Internal Medicine

## 2021-10-10 ENCOUNTER — Encounter: Payer: Self-pay | Admitting: Internal Medicine

## 2021-10-10 VITALS — BP 165/85 | HR 60 | Ht 67.0 in | Wt 123.0 lb

## 2021-10-10 DIAGNOSIS — E785 Hyperlipidemia, unspecified: Secondary | ICD-10-CM | POA: Diagnosis not present

## 2021-10-10 DIAGNOSIS — I480 Paroxysmal atrial fibrillation: Secondary | ICD-10-CM | POA: Diagnosis not present

## 2021-10-10 DIAGNOSIS — Z79899 Other long term (current) drug therapy: Secondary | ICD-10-CM | POA: Diagnosis not present

## 2021-10-10 NOTE — Patient Instructions (Addendum)
Medication Instructions:   *If you need a refill on your cardiac medications before your next appointment, please call your pharmacy*   Lab Work: BMET, NMR, TSH If you have labs (blood work) drawn today and your tests are completely normal, you will receive your results only by: [place] (if you have MyChart) OR A paper copy in the mail If you have any lab test that is abnormal or we need to change your treatment, we will call you to review the results.   Testing/Procedures:    Follow-Up: At [facility], you and your health needs are our priority.  As part of our continuing mission to provide you with exceptional heart care, we have created designated Provider Care Teams.  These Care Teams include your primary Cardiologist (physician) and Advanced Practice Providers (APPs -  Physician Assistants and Nurse Practitioners) who all work together to provide you with the care you need, when you need it.  We recommend signing up for the patient portal called "MyChart".  Sign up information is provided on this After Visit Summary.  MyChart is used to connect with patients for Virtual Visits (Telemedicine).  Patients are able to view lab/test results, encounter notes, upcoming appointments, etc.  Non-urgent messages can be sent to your provider as well.   To learn more about what you can do with MyChart, go to [email or web address].    Your next appointment:   5 month(s) MARCH  The format for your next appointment:   In Person  Provider:   Dorris Carnes, MD     Other Instructions   Important Information About Sugar

## 2021-10-11 LAB — BASIC METABOLIC PANEL
BUN/Creatinine Ratio: 15 (ref 12–28)
BUN: 26 mg/dL (ref 8–27)
CO2: 21 mmol/L (ref 20–29)
Calcium: 10 mg/dL (ref 8.7–10.3)
Chloride: 103 mmol/L (ref 96–106)
Creatinine, Ser: 1.74 mg/dL — ABNORMAL HIGH (ref 0.57–1.00)
Glucose: 101 mg/dL — ABNORMAL HIGH (ref 70–99)
Potassium: 4.7 mmol/L (ref 3.5–5.2)
Sodium: 141 mmol/L (ref 134–144)
eGFR: 29 mL/min/{1.73_m2} — ABNORMAL LOW (ref >59)

## 2021-10-11 LAB — NMR, LIPOPROFILE
Cholesterol, Total: 168 mg/dL (ref 100–199)
HDL Particle Number: 32.6 umol/L (ref >=30.5)
HDL-C: 72 mg/dL (ref >39)
LDL Particle Number: 824 nmol/L (ref <1000)
LDL Size: 21.9 nm (ref >20.5)
LDL-C (NIH Calc): 78 mg/dL (ref 0–99)
LP-IR Score: <25 (ref <=45)
Small LDL Particle Number: <90 nmol/L (ref <=527)
Triglycerides: 98 mg/dL (ref 0–149)

## 2021-10-11 LAB — TSH: TSH: 1.74 u[IU]/mL (ref 0.450–4.500)

## 2021-10-12 ENCOUNTER — Telehealth: Payer: Self-pay | Admitting: Internal Medicine

## 2021-10-12 ENCOUNTER — Other Ambulatory Visit: Payer: Self-pay | Admitting: *Deleted

## 2021-10-12 DIAGNOSIS — I701 Atherosclerosis of renal artery: Secondary | ICD-10-CM

## 2021-10-12 DIAGNOSIS — N289 Disorder of kidney and ureter, unspecified: Secondary | ICD-10-CM

## 2021-10-12 DIAGNOSIS — Z79899 Other long term (current) drug therapy: Secondary | ICD-10-CM

## 2021-10-12 NOTE — Telephone Encounter (Signed)
Patient aware of results. See result notes.

## 2021-10-12 NOTE — Telephone Encounter (Signed)
Patient's husband states he is returning a call, but does not know who called--may be regarding lab results. Please advise.

## 2021-10-22 ENCOUNTER — Telehealth: Payer: Self-pay

## 2021-10-22 DIAGNOSIS — I480 Paroxysmal atrial fibrillation: Secondary | ICD-10-CM

## 2021-10-22 NOTE — Telephone Encounter (Addendum)
Pt called to see if she can have a Vit B12 level with her planned labs for this Friday 10/26/21.

## 2021-10-23 NOTE — Telephone Encounter (Signed)
Yes, add B12 level

## 2021-10-23 NOTE — Telephone Encounter (Signed)
Lab added

## 2021-10-24 ENCOUNTER — Other Ambulatory Visit: Payer: Self-pay | Admitting: Internal Medicine

## 2021-10-26 ENCOUNTER — Ambulatory Visit: Payer: Medicare PPO | Attending: Internal Medicine

## 2021-10-26 DIAGNOSIS — N289 Disorder of kidney and ureter, unspecified: Secondary | ICD-10-CM

## 2021-10-26 DIAGNOSIS — Z79899 Other long term (current) drug therapy: Secondary | ICD-10-CM | POA: Diagnosis not present

## 2021-10-26 DIAGNOSIS — I480 Paroxysmal atrial fibrillation: Secondary | ICD-10-CM | POA: Diagnosis not present

## 2021-10-26 DIAGNOSIS — I701 Atherosclerosis of renal artery: Secondary | ICD-10-CM

## 2021-10-27 LAB — VITAMIN B12: Vitamin B-12: 185 pg/mL — ABNORMAL LOW (ref 232–1245)

## 2021-10-27 LAB — BASIC METABOLIC PANEL
BUN/Creatinine Ratio: 16 (ref 12–28)
BUN: 30 mg/dL — ABNORMAL HIGH (ref 8–27)
CO2: 21 mmol/L (ref 20–29)
Calcium: 9.2 mg/dL (ref 8.7–10.3)
Chloride: 104 mmol/L (ref 96–106)
Creatinine, Ser: 1.9 mg/dL — ABNORMAL HIGH (ref 0.57–1.00)
Glucose: 92 mg/dL (ref 70–99)
Potassium: 4.5 mmol/L (ref 3.5–5.2)
Sodium: 140 mmol/L (ref 134–144)
eGFR: 26 mL/min/{1.73_m2} — ABNORMAL LOW (ref >59)

## 2021-10-29 ENCOUNTER — Encounter: Payer: Self-pay | Admitting: Internal Medicine

## 2021-10-29 NOTE — Patient Instructions (Addendum)
     Blood work was ordered.     Medications changes include :      Your prescription(s) have been sent to your pharmacy.    A referral was ordered for XX.     Someone from that office will call you to schedule an appointment.    Return in about 6 months (around 05/01/2022) for Physical Exam.

## 2021-10-29 NOTE — Progress Notes (Unsigned)
Subjective:    Patient ID: Amy [name], female    DOB: 1935/05/18, 86 y.o.   MRN: [id number]     HPI Amy [name] is here for follow up of her chronic medical problems, including anxiety, insomnia, depression  She is taking the mirtazapine  - she does have a dry mouth and feel sleepy during the day at times - that is not usual.    She is still not sleeping well.    Medications and allergies reviewed with patient and updated if appropriate.  Current Outpatient Medications on File Prior to Visit  Medication Sig Dispense Refill   acetaminophen (TYLENOL) 650 MG CR tablet Take 1,300 mg by mouth 2 (two) times daily as needed for pain.     alclomethasone (ACLOVATE) 0.05 % cream Apply topically at bedtime. 60 g 0   AMBULATORY NON FORMULARY MEDICATION Take 1 drop by mouth daily as needed (pain). CBD Oil 1 dose under tongue once daily as needed for pain     amLODipine (NORVASC) 2.5 MG tablet Take 1 tablet (2.5 mg total) by mouth 2 (two) times daily. 180 tablet 3   amoxicillin (AMOXIL) 500 MG tablet Take by mouth.     atenolol (TENORMIN) 50 MG tablet Take 1 tablet (50 mg total) by mouth daily. 90 tablet 3   Biotin 5000 MCG CAPS Take 5,000 mcg by mouth daily.     Calcium Carbonate-Vitamin D (CALCIUM + D PO) Take 1 tablet by mouth daily.     Cholecalciferol 100 MCG (4000 UT) CAPS Take 1 capsule (4,000 Units total) by mouth daily. 30 capsule    cloNIDine (CATAPRES) 0.1 MG tablet Take 1 tablet (0.1 mg total) by mouth 3 (three) times daily. 270 tablet 3   dorzolamide-timolol (COSOPT) 22.3-6.8 MG/ML ophthalmic solution Place 1 drop into both eyes 2 (two) times daily.     flecainide (TAMBOCOR) 50 MG tablet TAKE 1 TABLET BY MOUTH TWICE A DAY 180 tablet 3   hydrALAZINE (APRESOLINE) 100 MG tablet Take 1 tablet (100 mg total) by mouth 3 (three) times daily. 270 tablet 3   levothyroxine (SYNTHROID) 50 MCG tablet TAKE 1 TABLET BY MOUTH EVERY DAY 90 tablet 3   linaclotide (LINZESS) 72 MCG capsule Take 1  capsule (72 mcg total) by mouth daily. 30 capsule 11   losartan (COZAAR) 50 MG tablet Take 1 tablet (50 mg total) by mouth 2 (two) times daily. 180 tablet 3   XARELTO 15 MG TABS tablet TAKE 1 TABLET BY MOUTH EVERY DAY WITH SUPPER 30 tablet 5   No current facility-administered medications on file prior to visit.     Review of Systems     Objective:   Vitals:   10/30/21 1453 10/30/21 1538  BP: (!) 160/70 (!) 140/68  Pulse: 67   Temp: 98 F (36.7 C)   SpO2: 97%    BP Readings from Last 3 Encounters:  10/30/21 (!) 140/68  10/10/21 (!) 165/85  10/02/21 128/78   Wt Readings from Last 3 Encounters:  10/30/21 126 lb (57.2 kg)  10/10/21 123 lb (55.8 kg)  10/02/21 122 lb 9.6 oz (55.6 kg)   Body mass index is 19.73 kg/m.    Physical Exam Constitutional:      General: She is not in acute distress.    Appearance: Normal appearance. She is not ill-appearing.  HENT:     Head: Normocephalic and atraumatic.  Skin:    General: Skin is warm and dry.  Neurological:  Mental Status: She is alert.  Psychiatric:        Mood and Affect: Mood normal.        Behavior: Behavior normal.        Thought Content: Thought content normal.        Judgment: Judgment normal.        Lab Results  Component Value Date   WBC 5.9 05/29/2021   HGB 10.3 (L) 05/29/2021   HCT 30.5 (L) 05/29/2021   PLT 257 05/29/2021   GLUCOSE 92 10/26/2021   CHOL 219 (H) 10/12/2020   TRIG 85 10/12/2020   HDL 95 10/12/2020   LDLDIRECT 84.9 11/15/2008   LDLCALC 109 (H) 10/12/2020   NA 140 10/26/2021   K 4.5 10/26/2021   CL 104 10/26/2021   CREATININE 1.90 (H) 10/26/2021   BUN 30 (H) 10/26/2021   CO2 21 10/26/2021   TSH 1.740 10/10/2021   INR 1.4 (H) 03/28/2011     Assessment & Plan:    See Problem List for Assessment and Plan of chronic medical problems.

## 2021-10-30 ENCOUNTER — Ambulatory Visit: Payer: Medicare PPO | Admitting: Internal Medicine

## 2021-10-30 VITALS — BP 140/68 | HR 67 | Temp 98.0°F | Ht 67.0 in | Wt 126.0 lb

## 2021-10-30 DIAGNOSIS — I10 Essential (primary) hypertension: Secondary | ICD-10-CM

## 2021-10-30 DIAGNOSIS — F5101 Primary insomnia: Secondary | ICD-10-CM

## 2021-10-30 DIAGNOSIS — E538 Deficiency of other specified B group vitamins: Secondary | ICD-10-CM

## 2021-10-30 DIAGNOSIS — F419 Anxiety disorder, unspecified: Secondary | ICD-10-CM | POA: Diagnosis not present

## 2021-10-30 DIAGNOSIS — I480 Paroxysmal atrial fibrillation: Secondary | ICD-10-CM

## 2021-10-30 DIAGNOSIS — Z79899 Other long term (current) drug therapy: Secondary | ICD-10-CM

## 2021-10-30 DIAGNOSIS — F32A Depression, unspecified: Secondary | ICD-10-CM | POA: Diagnosis not present

## 2021-10-30 MED ORDER — CYANOCOBALAMIN 1000 MCG/ML IJ SOLN
1000.0000 ug | Freq: Once | INTRAMUSCULAR | Status: AC
  Administered 2021-10-30: 1000 ug via INTRAMUSCULAR

## 2021-10-30 NOTE — Assessment & Plan Note (Signed)
Chronic Improved on repeat - control is ok for age Continue losartan 50 mg bid, hydralazine 100 mg tid, clonidine 0.1 mg tid, atenolol 50 mg daily, amlodipine 2.5 mg bid

## 2021-10-30 NOTE — Assessment & Plan Note (Signed)
Chronic Taking mirtazapine but it is causing a dry mouth and sleepiness during the day Her husband is doing better - not sure if she still needs it Will stop medication and monitor for now -- if she feels like she needs something can consider low dose lexapro or sertraline - she will let me know

## 2021-10-30 NOTE — Assessment & Plan Note (Signed)
Chronic Still not sleeping well - even with mirtazapine and she is having side effects to it  - will stop medication Her husband is doing better so hopefully her sleep will improve - if not can consider other options

## 2021-10-30 NOTE — Assessment & Plan Note (Signed)
Chronic Low B12 Will start monthly injections - first one today - f/u in one month for another injection Start oral B12 1000 mcg

## 2021-11-05 ENCOUNTER — Encounter: Payer: Self-pay | Admitting: Internal Medicine

## 2021-11-07 ENCOUNTER — Other Ambulatory Visit: Payer: Self-pay

## 2021-11-07 ENCOUNTER — Encounter (HOSPITAL_COMMUNITY): Payer: Self-pay | Admitting: Emergency Medicine

## 2021-11-07 ENCOUNTER — Emergency Department (HOSPITAL_COMMUNITY)
Admission: EM | Admit: 2021-11-07 | Discharge: 2021-11-08 | Disposition: A | Discharge status: Home / Self Care | Payer: Medicare PPO | Attending: Emergency Medicine | Admitting: Emergency Medicine

## 2021-11-07 DIAGNOSIS — Z7901 Long term (current) use of anticoagulants: Secondary | ICD-10-CM | POA: Insufficient documentation

## 2021-11-07 DIAGNOSIS — I4891 Unspecified atrial fibrillation: Secondary | ICD-10-CM | POA: Insufficient documentation

## 2021-11-07 DIAGNOSIS — I160 Hypertensive urgency: Secondary | ICD-10-CM | POA: Diagnosis not present

## 2021-11-07 DIAGNOSIS — I10 Essential (primary) hypertension: Secondary | ICD-10-CM | POA: Insufficient documentation

## 2021-11-07 DIAGNOSIS — E039 Hypothyroidism, unspecified: Secondary | ICD-10-CM | POA: Diagnosis not present

## 2021-11-07 DIAGNOSIS — Z79899 Other long term (current) drug therapy: Secondary | ICD-10-CM | POA: Insufficient documentation

## 2021-11-07 NOTE — ED Triage Notes (Signed)
Pt states her BP was 196/84 and she wants to be evaluated for it. States she is on BP meds at home and has taken all her scheduled BP meds except her 2300 dose.

## 2021-11-07 NOTE — ED Notes (Signed)
Pt brought home meds with her and has decided to take her 2300 BP meds ( Hydralazine, Losartan, and Clonidine) now while she waits to be taken to a room.

## 2021-11-08 LAB — BASIC METABOLIC PANEL
Anion gap: 8 (ref 5–15)
BUN: 24 mg/dL — ABNORMAL HIGH (ref 8–23)
CO2: 22 mmol/L (ref 22–32)
Calcium: 9.4 mg/dL (ref 8.9–10.3)
Chloride: 107 mmol/L (ref 98–111)
Creatinine, Ser: 1.45 mg/dL — ABNORMAL HIGH (ref 0.44–1.00)
GFR, Estimated: 36 mL/min — ABNORMAL LOW (ref >60)
Glucose, Bld: 111 mg/dL — ABNORMAL HIGH (ref 70–99)
Potassium: 4 mmol/L (ref 3.5–5.1)
Sodium: 137 mmol/L (ref 135–145)

## 2021-11-08 LAB — CBC WITH DIFFERENTIAL/PLATELET
Abs Immature Granulocytes: 0.04 10*3/uL (ref 0.00–0.07)
Basophils Absolute: 0.1 10*3/uL (ref 0.0–0.1)
Basophils Relative: 1 %
Eosinophils Absolute: 0 10*3/uL (ref 0.0–0.5)
Eosinophils Relative: 0 %
HCT: 33 % — ABNORMAL LOW (ref 36.0–46.0)
Hemoglobin: 10.8 g/dL — ABNORMAL LOW (ref 12.0–15.0)
Immature Granulocytes: 1 %
Lymphocytes Relative: 15 %
Lymphs Abs: 1.3 10*3/uL (ref 0.7–4.0)
MCH: 33.6 pg (ref 26.0–34.0)
MCHC: 32.7 g/dL (ref 30.0–36.0)
MCV: 102.8 fL — ABNORMAL HIGH (ref 80.0–100.0)
Monocytes Absolute: 1.2 10*3/uL — ABNORMAL HIGH (ref 0.1–1.0)
Monocytes Relative: 14 %
Neutro Abs: 6 10*3/uL (ref 1.7–7.7)
Neutrophils Relative %: 69 %
Platelets: 265 10*3/uL (ref 150–400)
RBC: 3.21 MIL/uL — ABNORMAL LOW (ref 3.87–5.11)
RDW: 13.5 % (ref 11.5–15.5)
WBC: 8.6 10*3/uL (ref 4.0–10.5)
nRBC: 0 % (ref 0.0–0.2)

## 2021-11-08 LAB — TROPONIN I (HIGH SENSITIVITY): Troponin I (High Sensitivity): 17 ng/L (ref <18)

## 2021-11-08 MED ORDER — CLONIDINE HCL 0.1 MG PO TABS
0.1000 mg | ORAL_TABLET | Freq: Once | ORAL | Status: AC
  Administered 2021-11-08: 0.1 mg via ORAL
  Filled 2021-11-08: qty 1

## 2021-11-08 NOTE — ED Provider Notes (Signed)
[facility] EMERGENCY DEPARTMENT Provider Note   CSN: [id number] Arrival date & time: 11/07/21  2126     History  Chief Complaint  Patient presents with   Hypertension    Amy [name] is a 86 y.o. female.  Patient is an 86 year old female with history of hypertension, hypothyroidism, chronic renal insufficiency, paroxysmal A-fib on anticoagulation.  Patient presenting today for evaluation of elevated blood pressure.  Patient says she checks her blood pressures normally 3 times daily.  This evening she checked it and it was over 200.  She is otherwise asymptomatic.  She denies any headache, chest pain, or difficulty breathing.  She did take her nighttime blood pressure medications shortly after she arrived here.  The history is provided by the patient.       Home Medications Prior to Admission medications   Medication Sig Start Date End Date Taking? Authorizing Provider  acetaminophen (TYLENOL) 650 MG CR tablet Take 1,300 mg by mouth 2 (two) times daily as needed for pain.    [provider]  alclomethasone (ACLOVATE) 0.05 % cream Apply topically at bedtime. 10/12/20   Sheffield, Ronalee Red, PA-C  AMBULATORY NON FORMULARY MEDICATION Take 1 drop by mouth daily as needed (pain). CBD Oil 1 dose under tongue once daily as needed for pain    [provider]  amLODipine (NORVASC) 2.5 MG tablet Take 1 tablet (2.5 mg total) by mouth 2 (two) times daily. 04/09/21   Fay Records, MD  amoxicillin (AMOXIL) 500 MG tablet Take by mouth. 05/21/21   [provider]  atenolol (TENORMIN) 50 MG tablet Take 1 tablet (50 mg total) by mouth daily. 01/31/21   Fay Records, MD  Biotin 5000 MCG CAPS Take 5,000 mcg by mouth daily.    [provider]  Calcium Carbonate-Vitamin D (CALCIUM + D PO) Take 1 tablet by mouth daily.    [provider]  Cholecalciferol 100 MCG (4000 UT) CAPS Take 1 capsule (4,000 Units total) by mouth daily. 01/27/18   Fay Records, MD   cloNIDine (CATAPRES) 0.1 MG tablet Take 1 tablet (0.1 mg total) by mouth 3 (three) times daily. 01/02/21   Fay Records, MD  dorzolamide-timolol (COSOPT) 22.3-6.8 MG/ML ophthalmic solution Place 1 drop into both eyes 2 (two) times daily. 06/22/19   [provider]  flecainide (TAMBOCOR) 50 MG tablet TAKE 1 TABLET BY MOUTH TWICE A DAY 05/10/21   Fay Records, MD  hydrALAZINE (APRESOLINE) 100 MG tablet Take 1 tablet (100 mg total) by mouth 3 (three) times daily. 01/09/21   Fay Records, MD  levothyroxine (SYNTHROID) 50 MCG tablet TAKE 1 TABLET BY MOUTH EVERY DAY 05/10/21   Fay Records, MD  linaclotide [facility]) 72 MCG capsule Take 1 capsule (72 mcg total) by mouth daily. 09/14/20   Milus Banister, MD  losartan (COZAAR) 50 MG tablet Take 1 tablet (50 mg total) by mouth 2 (two) times daily. 12/25/20   Fay Records, MD  XARELTO 15 MG TABS tablet TAKE 1 TABLET BY MOUTH EVERY DAY WITH SUPPER 12/04/20   Fay Records, MD      Allergies    Patient has no known allergies.    Review of Systems   Review of Systems  All other systems reviewed and are negative.   Physical Exam Updated Vital Signs BP (!) 174/75   Pulse 72   Temp 97.7 F (36.5 C) (Oral)   Resp 18   Ht '5\' 7"'$  (1.702  m)   Wt 57 kg   LMP  (LMP Unknown)   SpO2 95%   BMI 19.68 kg/m  Physical Exam Vitals and nursing note reviewed.  Constitutional:      General: She is not in acute distress.    Appearance: She is well-developed. She is not diaphoretic.  HENT:     Head: Normocephalic and atraumatic.  Eyes:     Extraocular Movements: Extraocular movements intact.     Pupils: Pupils are equal, round, and reactive to light.  Cardiovascular:     Rate and Rhythm: Normal rate and regular rhythm.     Heart sounds: No murmur heard.    No friction rub. No gallop.  Pulmonary:     Effort: Pulmonary effort is normal. No respiratory distress.     Breath sounds: Normal breath sounds. No wheezing.  Abdominal:     General: Bowel  sounds are normal. There is no distension.     Palpations: Abdomen is soft.     Tenderness: There is no abdominal tenderness.  Musculoskeletal:        General: Normal range of motion.     Cervical back: Normal range of motion and neck supple.  Skin:    General: Skin is warm and dry.  Neurological:     General: No focal deficit present.     Mental Status: She is alert and oriented to person, place, and time.     Cranial Nerves: No cranial nerve deficit.     ED Results / Procedures / Treatments   Labs (all labs ordered are listed, but only abnormal results are displayed) Labs Reviewed - No data to display  EKG EKG Interpretation  Date/Time:  Thursday November 08 2021 01:04:36 EDT Ventricular Rate:  74 PR Interval:  250 QRS Duration: 172 QT Interval:  448 QTC Calculation: 498 R Axis:   20 Text Interpretation: Sinus rhythm Prolonged PR interval Left atrial enlargement Right bundle branch block Confirmed by Veryl Speak [phone]) on 11/08/2021 1:15:38 AM  Radiology No results found.  Procedures Procedures    Medications Ordered in ED Medications - No data to display  ED Course/ Medical Decision Making/ A&P  Patient is an 86 year old female presenting with complaints of elevated blood pressure.  She normally checks her blood pressure 3 times daily and today it was elevated over 200.  Patient arrives to the ER with stable vital signs and no hypoxia.  Initial blood pressure was 215/72.  Physical examination otherwise unremarkable and she is neurologically intact.  Work-up initiated including EKG and laboratory studies to rule out endorgan damage.  EKG and troponin unchanged and creatinine consistent with baseline.  Patient took her blood pressure medicines shortly after arriving here and was given an additional dose of clonidine with good results.  Blood pressure is now 170 and she remains without complaint otherwise.  I feel as though patient can safely be discharged with  close monitoring of her blood pressure at home and follow-up with primary doctor.  Final Clinical Impression(s) / ED Diagnoses Final diagnoses:  None    Rx / DC Orders ED Discharge Orders     None         Veryl Speak, MD 11/08/21 0221

## 2021-11-08 NOTE — Discharge Instructions (Signed)
Continue medications as previously prescribed.  Keep a record of your blood pressure and take this with you to your next doctor's appointment.  Return to the emergency department for any new and/or concerning symptoms.

## 2021-11-09 ENCOUNTER — Encounter: Payer: Self-pay | Admitting: Physician Assistant

## 2021-11-09 ENCOUNTER — Ambulatory Visit: Payer: Medicare PPO | Attending: Physician Assistant | Admitting: Physician Assistant

## 2021-11-09 VITALS — BP 172/82 | HR 65 | Ht 67.0 in | Wt 123.4 lb

## 2021-11-09 DIAGNOSIS — I701 Atherosclerosis of renal artery: Secondary | ICD-10-CM | POA: Diagnosis not present

## 2021-11-09 DIAGNOSIS — I10 Essential (primary) hypertension: Secondary | ICD-10-CM

## 2021-11-09 DIAGNOSIS — Z79899 Other long term (current) drug therapy: Secondary | ICD-10-CM

## 2021-11-09 DIAGNOSIS — N289 Disorder of kidney and ureter, unspecified: Secondary | ICD-10-CM | POA: Diagnosis not present

## 2021-11-09 DIAGNOSIS — I480 Paroxysmal atrial fibrillation: Secondary | ICD-10-CM

## 2021-11-09 MED ORDER — AMLODIPINE BESYLATE 5 MG PO TABS: 5.0000 mg | ORAL_TABLET | Freq: Every day | ORAL | 3 refills | Status: DC

## 2021-11-09 MED ORDER — AMLODIPINE BESYLATE 5 MG PO TABS: 5.0000 mg | ORAL_TABLET | Freq: Two times a day (BID) | ORAL | 3 refills | Status: DC

## 2021-11-09 NOTE — Progress Notes (Signed)
Office Visit    Patient Name: Amy [name] Date of Encounter: 11/09/2021  PCP:  Binnie Rail, MD   [place]  Cardiologist:  Dorris Carnes, MD  Advanced Practice Provider:  No care team member to display Electrophysiologist:  None   HPI    Amy [name] is a 86 y.o. female with a past medical history significant for uncontrolled hypertension, hypothyroidism, chronic renal disease, paroxysmal atrial fibrillation, renal artery stenosis presents today for hospital follow-up.  She presented to the ED for evaluation of blood pressure.  She checks her blood pressure normally 3 times daily and when she checked it the evening of 11/07/2021 it was above 948 systolic.  Otherwise she was asymptomatic.  She denied headache, chest pain, or difficulty breathing.  She did take her nighttime blood pressure medication shortly after arriving at the ED.  She was given an additional dose of clonidine with good results.  Blood pressure was initially 215/72 and came down to 546 systolic.  She was then discharged and set up for close follow-up today.  Today, she comes in today with uncontrolled BP. Today, it was 172/82.  She states at home when she is checked it has been anywhere from 270J systolic to 500 systolic.  She was recently in the ER and she was given an extra clonidine with good results.  We discussed titration of her medications today.  We also discussed repeating a renal ultrasound to look at her right renal stent to see if maybe she has some in-stent stenosis.  Her hypertensive regimen currently consists of amlodipine 2.5 mg twice daily, atenolol 50 mg daily, clonidine 0.1 mg 3 times daily, hydralazine 100 mg 3 times daily, Cozaar 50 mg twice daily.  She tells me when her blood pressure gets high her face and ears get red and she knows to check her blood pressure at this time.  Reports no shortness of breath nor dyspnea on exertion. Reports no chest pain, pressure, or  tightness. No edema, orthopnea, PND. Reports no palpitations.    Past Medical History    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   Right Malar Cheek   Palpitations    Past Surgical History:  Procedure Laterality Date   A FLUTTER ABLATION     KNEE ARTHROSCOPY Right    PERIPHERAL VASCULAR INTERVENTION Right 02/13/2021   Procedure: PERIPHERAL VASCULAR INTERVENTION;  Surgeon: Serafina Mitchell, MD;  Location: [facility];  Service: Cardiovascular;  Laterality: Right;   RENAL ANGIOGRAPHY N/A 02/13/2021   Procedure: RENAL ANGIOGRAPHY;  Surgeon: Serafina Mitchell, MD;  Location: [facility];  Service: Cardiovascular;  Laterality: N/A;   TUBAL LIGATION      Allergies  No Known Allergies  EKGs/Labs/Other Studies Reviewed:   The following studies were reviewed today:  Vas US renal arteries 2/23  Summary:  Renal:     Right: RRV flow present. Technically difficult. Proximal and mid         right renal artery/stent could not be visualized. No evidence         of right renal artery stenosis in the distal renal artery.  Left:  No evidence of left renal artery stenosis. LRV flow present.     *See table(s) above for measurements and observations.     Diagnosing physician: Harold Barban MD   EKG:  EKG is not ordered today.   Recent Labs: 03/05/2021: NT-Pro BNP 623 10/10/2021: TSH 1.740 11/08/2021: BUN 24; Creatinine, Ser 1.45; Hemoglobin 10.8; Platelets 265; Potassium 4.0; Sodium 137  Recent Lipid Panel    Component Value Date/Time   CHOL 219 (H) 10/12/2020 1617   TRIG 85 10/12/2020 1617   HDL 95 10/12/2020 1617   CHOLHDL 2.3 10/12/2020 1617   CHOLHDL 2.0 06/05/2015 0946   VLDL 10 06/05/2015 0946   LDLCALC 109 (H)  10/12/2020 1617   LDLDIRECT 84.9 11/15/2008 0924    Risk Assessment/Calculations:   CHA2DS2-VASc Score = 4   This indicates a 4.8% annual risk of stroke. The patient's score is based upon: CHF History: 0 HTN History: 1 Diabetes History: 0 Stroke History: 0 Vascular Disease History: 0 Age Score: 2 Gender Score: 1     Home Medications   Current Meds  Medication Sig   acetaminophen (TYLENOL) 650 MG CR tablet Take 1,300 mg by mouth 2 (two) times daily as needed for pain.   alclomethasone (ACLOVATE) 0.05 % cream Apply topically at bedtime.   AMBULATORY NON FORMULARY MEDICATION Take 1 drop by mouth daily as needed (pain). CBD Oil 1 dose under tongue once daily as needed for pain   atenolol (TENORMIN) 50 MG tablet Take 1 tablet (50 mg total) by mouth daily.   Biotin 5000 MCG CAPS Take 5,000 mcg by mouth daily.   Calcium Carbonate-Vitamin D (CALCIUM + D PO) Take 1 tablet by mouth daily.   Cholecalciferol 100 MCG (4000 UT) CAPS Take 1 capsule (4,000 Units total) by mouth daily.   cloNIDine (CATAPRES) 0.1 MG tablet Take 1 tablet (0.1 mg total) by mouth 3 (three) times daily.   dorzolamide-timolol (COSOPT) 22.3-6.8 MG/ML ophthalmic solution Place 1 drop into both eyes 2 (two) times daily.   flecainide (TAMBOCOR) 50 MG tablet TAKE 1 TABLET BY MOUTH TWICE A DAY   hydrALAZINE (APRESOLINE) 100 MG tablet Take 1 tablet (100 mg total) by mouth 3 (three) times daily.   levothyroxine (SYNTHROID) 50 MCG tablet TAKE 1 TABLET BY MOUTH EVERY DAY   linaclotide (LINZESS) 72 MCG capsule Take 1 capsule (72 mcg total) by mouth daily.   losartan (COZAAR) 50 MG tablet Take 1 tablet (50 mg total) by mouth 2 (two) times daily.   XARELTO 15 MG TABS tablet TAKE 1 TABLET BY MOUTH EVERY DAY WITH SUPPER   [DISCONTINUED] amLODipine (NORVASC) 2.5 MG tablet Take 1 tablet (2.5 mg total) by mouth 2 (two) times daily.   [DISCONTINUED] amLODipine (NORVASC) 5 MG tablet Take 1 tablet (5 mg total) by mouth daily.      Review of Systems      All other systems reviewed and are otherwise negative except as noted above.  Physical Exam    VS:  BP (!) 172/82   Pulse 65   Ht '5\' 7"'$  (1.702 m)   Wt 123 lb 6.4 oz (56 kg)   LMP  (LMP Unknown)   SpO2 97%   BMI 19.33 kg/m  , BMI Body mass index is 19.33 kg/m.  Wt Readings from Last 3 Encounters:  11/09/21 123 lb 6.4 oz (56 kg)  11/07/21 125 lb 10.6 oz (57 kg)  10/30/21 126 lb (57.2 kg)     GEN: Well nourished, well developed, in no acute  distress. HEENT: normal. Neck: Supple, no JVD, carotid bruits, or masses. Cardiac: RRR, no murmurs, rubs, or gallops. No clubbing, cyanosis, edema.  Radials/PT 2+ and equal bilaterally.  Respiratory:  Respirations regular and unlabored, clear to auscultation bilaterally. GI: Soft, nontender, nondistended. MS: No deformity or atrophy. Skin: Warm and dry, no rash. Neuro:  Strength and sensation are intact. Psych: Normal affect.  Assessment & Plan    Uncontrolled HTN -She is on a multidrug regimen including Norvasc 2.5 mg twice a day-increase to '5mg'$  BID, atenolol 50 mg daily, clonidine 0.1 mg 3 times daily, hydralazine 100 mg 3 times daily, losartan 50 mg twice daily -When she presented to the ED blood pressure was 680 systolic -> 321 on the top number can take extra Clonidine  0.1 mg   Renal artery stenosis -Probable occlusion of the right renal artery on ultrasound 12/22.  Angiogram was recommended. Follow-up renal artery ultrasound 2/23 showed no evidence of right renal stenosis and no evidence of left renal stenosis -renal artery stent placement- 2021  Paroxysmal atrial fibrillation -no issues, since ablation 5 or 6 years ago   Chronic renal insufficiency -recent BMP 10/19 so will cancel BMP scheduled for 11/2         Disposition: Follow up 5 months with Dorris Carnes, MD or APP.  Signed, Elgie Collard, PA-C 11/09/2021, 4:11 PM [facility]

## 2021-11-09 NOTE — Patient Instructions (Signed)
Medication Instructions:  1.Increase amlodipine to 5 mg twice daily 2.You may take an extra clonidine as needed for systolic (top number) of greater than 160 *If you need a refill on your cardiac medications before your next appointment, please call your pharmacy*   Lab Work: None If you have labs (blood work) drawn today and your tests are completely normal, you will receive your results only by: [facility] (if you have MyChart) OR A paper copy in the mail If you have any lab test that is abnormal or we need to change your treatment, we will call you to review the results.   Testing/Procedures: Your physician has requested that you have a renal artery duplex. During this test, an ultrasound is used to evaluate blood flow to the kidneys. Allow one hour for this exam. Do not eat after midnight the day before and avoid carbonated beverages. Take your medications as you usually do.    Follow-Up: At [facility], you and your health needs are our priority.  As part of our continuing mission to provide you with exceptional heart care, we have created designated Provider Care Teams.  These Care Teams include your primary Cardiologist (physician) and Advanced Practice Providers (APPs -  Physician Assistants and Nurse Practitioners) who all work together to provide you with the care you need, when you need it.   Your next appointment:  Schedule a nurse visit for a blood pressure check in 2 weeks  Then   04/15/22 at 11:20 AM  The format for your next appointment:   In Person  Provider:   Dorris Carnes, MD   Other Instructions Check your blood pressure twice daily, one hour after you take your morning medications and one hour after you take your evening medications for the next two weeks, keep a log and bring it with you to your nurse visit in 2 weeks.  Important Information About Sugar

## 2021-11-19 ENCOUNTER — Ambulatory Visit (HOSPITAL_COMMUNITY)
Admission: RE | Admit: 2021-11-19 | Discharge: 2021-11-19 | Disposition: A | Discharge status: Home / Self Care | Payer: Medicare PPO | Source: Ambulatory Visit | Attending: Internal Medicine | Admitting: Internal Medicine

## 2021-11-19 DIAGNOSIS — I701 Atherosclerosis of renal artery: Secondary | ICD-10-CM | POA: Insufficient documentation

## 2021-11-22 ENCOUNTER — Other Ambulatory Visit: Payer: Medicare PPO

## 2021-11-23 ENCOUNTER — Encounter: Payer: Medicare PPO | Admitting: Internal Medicine

## 2021-11-26 ENCOUNTER — Ambulatory Visit: Payer: Medicare PPO | Attending: Family Medicine

## 2021-11-26 VITALS — BP 127/55 | HR 55 | Ht 67.0 in | Wt 122.2 lb

## 2021-11-26 DIAGNOSIS — I10 Essential (primary) hypertension: Secondary | ICD-10-CM

## 2021-11-26 MED ORDER — LOSARTAN POTASSIUM 50 MG PO TABS: 100.0000 mg | ORAL_TABLET | Freq: Every day | ORAL | 3 refills | Status: DC

## 2021-11-26 MED ORDER — AMLODIPINE BESYLATE 5 MG PO TABS: 10.0000 mg | ORAL_TABLET | Freq: Every day | ORAL | 3 refills | Status: DC

## 2021-11-26 NOTE — Patient Instructions (Signed)
Medication Instructions:  Your physician has recommended you make the following change in your medication:   Take your Amlodipine '5mg'$  - 2 tablets ('10mg'$ ) by mouth daily  Take your Losartan '50mg'$  - 2 tablets ('100mg'$ ) by mouth daily  *If you need a refill on your cardiac medications before your next appointment, please call your pharmacy*   Lab Work: None ordered.  If you have labs (blood work) drawn today and your tests are completely normal, you will receive your results only by: [facility] (if you have MyChart) OR A paper copy in the mail If you have any lab test that is abnormal or we need to change your treatment, we will call you to review the results.   Testing/Procedures: None ordered.    Follow-Up: At [facility], you and your health needs are our priority.  As part of our continuing mission to provide you with exceptional heart care, we have created designated Provider Care Teams.  These Care Teams include your primary Cardiologist (physician) and Advanced Practice Providers (APPs -  Physician Assistants and Nurse Practitioners) who all work together to provide you with the care you need, when you need it.  We recommend signing up for the patient portal called "MyChart".  Sign up information is provided on this After Visit Summary.  MyChart is used to connect with patients for Virtual Visits (Telemedicine).  Patients are able to view lab/test results, encounter notes, upcoming appointments, etc.  Non-urgent messages can be sent to your provider as well.   To learn more about what you can do with MyChart, go to [email or web address].    Your next appointment:   Follow up as scheduled  Send [place] your BP readings by MyChart in 1-2 weeks.  Important Information About Sugar

## 2021-11-26 NOTE — Progress Notes (Signed)
   Nurse Visit   Date of Encounter: 11/26/2021 ID: Amy [name], DOB February 06, 1935, MRN [id number]  PCP:  Binnie Rail, MD   [place] Providers Cardiologist:  Dorris Carnes, MD      Visit Details   VS:  BP (!) 127/55   Pulse (!) 55   Ht '5\' 7"'$  (1.702 m)   Wt 122 lb 3.2 oz (55.4 kg)   LMP  (LMP Unknown)   BMI 19.14 kg/m  , BMI Body mass index is 19.14 kg/m.  Wt Readings from Last 3 Encounters:  11/26/21 122 lb 3.2 oz (55.4 kg)  11/09/21 123 lb 6.4 oz (56 kg)  11/07/21 125 lb 10.6 oz (57 kg)     Reason for visit: BP recheck Performed today: Vital signs, Consulted with Dr Radford Pax, DOD and provided BP education Changes (medications, testing, etc.) : Amlodipine '10mg'$  by mouth daily and Losartan '100mg'$  daily Length of Visit: 30 minutes    Medications Adjustments/Labs and Tests Ordered: No orders of the defined types were placed in this encounter.  Meds ordered this encounter  Medications   amLODipine (NORVASC) 5 MG tablet    Sig: Take 2 tablets (10 mg total) by mouth daily.    Dispense:  180 tablet    Refill:  3   losartan (COZAAR) 50 MG tablet    Sig: Take 2 tablets (100 mg total) by mouth daily.    Dispense:  180 tablet    Refill:  3   Pt advised of medication adjustments and to continue to monitor BP daily and record.  Pt will send readings through MyChart in 1-2 weeks.  She will plan to keep follow up as recommended.  Signed, Thora Lance, RN  11/26/2021 11:55 AM

## 2021-11-27 ENCOUNTER — Ambulatory Visit: Payer: Medicare PPO | Admitting: Internal Medicine

## 2021-11-27 DIAGNOSIS — H40012 Open angle with borderline findings, low risk, left eye: Secondary | ICD-10-CM | POA: Diagnosis not present

## 2021-11-27 DIAGNOSIS — H401111 Primary open-angle glaucoma, right eye, mild stage: Secondary | ICD-10-CM | POA: Diagnosis not present

## 2021-11-30 ENCOUNTER — Ambulatory Visit (INDEPENDENT_AMBULATORY_CARE_PROVIDER_SITE_OTHER): Payer: Medicare PPO

## 2021-11-30 DIAGNOSIS — E538 Deficiency of other specified B group vitamins: Secondary | ICD-10-CM | POA: Diagnosis not present

## 2021-11-30 MED ORDER — CYANOCOBALAMIN 1000 MCG/ML IJ SOLN
1000.0000 ug | Freq: Once | INTRAMUSCULAR | Status: AC
  Administered 2021-11-30: 1000 ug via INTRAMUSCULAR

## 2021-11-30 NOTE — Progress Notes (Signed)
B12 given.  Pt tolerated well. Pt is aware to give the office a call for an side effects or reactions. Please co-sign.   

## 2021-12-10 ENCOUNTER — Encounter: Payer: Self-pay | Admitting: Internal Medicine

## 2021-12-15 ENCOUNTER — Other Ambulatory Visit: Payer: Self-pay | Admitting: Internal Medicine

## 2021-12-19 ENCOUNTER — Ambulatory Visit: Payer: Medicare PPO | Admitting: Physician Assistant

## 2021-12-21 ENCOUNTER — Other Ambulatory Visit: Payer: Self-pay | Admitting: Gastroenterology

## 2021-12-21 ENCOUNTER — Other Ambulatory Visit: Payer: Self-pay | Admitting: Internal Medicine

## 2021-12-21 NOTE — Telephone Encounter (Signed)
Okay to refill if she is on stable dosing of this and is working okay for her.

## 2021-12-21 NOTE — Telephone Encounter (Signed)
Good afternoon Dr Havery Moros,   This is a patient of Dr Ardis Hughs.  Please advise on refills as you are DOD pm.  Thank you

## 2021-12-26 DIAGNOSIS — I701 Atherosclerosis of renal artery: Secondary | ICD-10-CM

## 2021-12-31 ENCOUNTER — Ambulatory Visit: Payer: Medicare PPO | Admitting: Physician Assistant

## 2021-12-31 VITALS — BP 142/59 | HR 58 | Temp 96.8°F | Resp 14 | Ht 67.0 in | Wt 123.0 lb

## 2021-12-31 DIAGNOSIS — I701 Atherosclerosis of renal artery: Secondary | ICD-10-CM

## 2021-12-31 NOTE — Progress Notes (Signed)
Office Note   History of Present Illness   Amy [name] is a 86 y.o. (1935/11/03) female who presents for follow-up of renal artery stenosis.  She has a history of right renal artery stenting by Dr. Trula Slade on 02/13/2021.  This was performed secondary to uncontrolled hypertension despite medical management on multiple agents and CKD.  At her last follow-up with [place], she was still having issues with fluctuating blood pressure but overall it was better.  At follow-up today, she expresses concern for 1 incident in October where her systolic blood pressure was in the 180s.  At that time she was asymptomatic, however it scared her so she went to the emergency room.  She was given clonidine and her blood pressure was very responsive to it, and she was discharged home with cardiology follow-up.  At her cardiology follow-up, they repeated a renal artery duplex scan and scheduled her follow-up with our office.  She states since October she has not had any other episodes of systolic blood pressures greater than 150.  Most of the time her systolic blood pressure is somewhere between 110-140.  She is still on amlodipine, atenolol, losartan, hydralazine, and clonidine.   Current Outpatient Medications  Medication Sig Dispense Refill   acetaminophen (TYLENOL) 650 MG CR tablet Take 1,300 mg by mouth 2 (two) times daily as needed for pain.     alclomethasone (ACLOVATE) 0.05 % cream Apply topically at bedtime. 60 g 0   AMBULATORY NON FORMULARY MEDICATION Take 1 drop by mouth daily as needed (pain). CBD Oil 1 dose under tongue once daily as needed for pain     amLODipine (NORVASC) 5 MG tablet Take 2 tablets (10 mg total) by mouth daily. 180 tablet 3   atenolol (TENORMIN) 50 MG tablet Take 1 tablet (50 mg total) by mouth daily. 90 tablet 3   Biotin 5000 MCG CAPS Take 5,000 mcg by mouth daily.     Calcium Carbonate-Vitamin D (CALCIUM + D PO) Take 1 tablet by mouth daily.     Cholecalciferol 100 MCG (4000 UT)  CAPS Take 1 capsule (4,000 Units total) by mouth daily. 30 capsule    cloNIDine (CATAPRES) 0.1 MG tablet Take 1 tablet (0.1 mg total) by mouth 3 (three) times daily. 270 tablet 3   dorzolamide-timolol (COSOPT) 22.3-6.8 MG/ML ophthalmic solution Place 1 drop into both eyes 2 (two) times daily.     flecainide (TAMBOCOR) 50 MG tablet TAKE 1 TABLET BY MOUTH TWICE A DAY 180 tablet 3   hydrALAZINE (APRESOLINE) 100 MG tablet TAKE 1 TABLET BY MOUTH 3 TIMES DAILY. 270 tablet 3   levothyroxine (SYNTHROID) 50 MCG tablet TAKE 1 TABLET BY MOUTH EVERY DAY 90 tablet 3   linaclotide (LINZESS) 72 MCG capsule TAKE 1 CAPSULE BY MOUTH DAILY. 30 capsule 1   losartan (COZAAR) 50 MG tablet TAKE 1 TABLET BY MOUTH TWICE A DAY 180 tablet 3   XARELTO 15 MG TABS tablet TAKE 1 TABLET BY MOUTH EVERY DAY WITH SUPPER 30 tablet 5   amoxicillin (AMOXIL) 500 MG tablet Take by mouth. (Patient not taking: Reported on 11/09/2021)     No current facility-administered medications for this visit.    REVIEW OF SYSTEMS (negative unless checked):   Cardiac:  '[]'$  Chest pain or chest pressure? '[]'$  Shortness of breath upon activity? '[]'$  Shortness of breath when lying flat? '[]'$  Irregular heart rhythm?  Vascular:  '[]'$  Pain in calf, thigh, or hip brought on by walking? '[]'$  Pain in feet at  night that wakes you up from your sleep? '[]'$  Blood clot in your veins? '[]'$  Leg swelling?  Pulmonary:  '[]'$  Oxygen at home? '[]'$  Productive cough? '[]'$  Wheezing?  Neurologic:  '[]'$  Sudden weakness in arms or legs? '[]'$  Sudden numbness in arms or legs? '[]'$  Sudden onset of difficult speaking or slurred speech? '[]'$  Temporary loss of vision in one eye? '[]'$  Problems with dizziness?  Gastrointestinal:  '[]'$  Blood in stool? '[]'$  Vomited blood?  Genitourinary:  '[]'$  Burning when urinating? '[]'$  Blood in urine?  Psychiatric:  '[]'$  Major depression  Hematologic:  '[]'$  Bleeding problems? '[]'$  Problems with blood clotting?  Dermatologic:  '[]'$  Rashes or  ulcers?  Constitutional:  '[]'$  Fever or chills?  Ear/Nose/Throat:  '[]'$  Change in hearing? '[]'$  Nose bleeds? '[]'$  Sore throat?  Musculoskeletal:  '[]'$  Back pain? '[]'$  Joint pain? '[]'$  Muscle pain?   Physical Examination   Vitals:   12/31/21 1123  BP: (!) 142/59  Pulse: (!) 58  Resp: 14  Temp: (!) 96.8 F (36 C)  TempSrc: Temporal  SpO2: 98%  Weight: 123 lb (55.8 kg)  Height: '5\' 7"'$  (1.702 m)   Body mass index is 19.26 kg/m.  General:  WDWN in NAD; vital signs documented above Gait: Not observed HENT: WNL, normocephalic Pulmonary: normal non-labored breathing , without Rales, rhonchi,  wheezing Cardiac: Irregularly irregular A-fib, no carotid bruit Abdomen: soft, NT, no masses Skin: without rashes Vascular Exam/Pulses: 1+ DP pulses bilaterally Extremities: Without ischemic changes, swelling, or open wounds Musculoskeletal: no muscle wasting or atrophy  Neurologic: A&O X 3;  No focal weakness or paresthesias are detected Psychiatric:  The pt has Normal affect.  Non-Invasive Vascular Imaging Renal Artery [place] (11/19/2021)  Right: Evidence of a > 60% stenosis of the right renal artery. RRV         flow present. Abnormal size for the right kidney. Abnormal         right Resistive Index. Normal cortical thickness of right         kidney.  Left:  No evidence of left renal artery stenosis. LRV flow present.         Abnormal size for the left kidney. Abnormal left Resisitve         Index. Normal cortical thickness of the left kidney.    Medical Decision Making   Amy [name] is a 86 y.o. female who presents with follow-up for renal artery stenosis  Based on the patient's renal artery duplex on November 19, 2021, there is evidence of greater than 60% stenosis at the origin of the right renal artery.  Peak systolic velocity is 130 cm/s.  The patient's right renal artery stent is patent.  At her last visit with [place], the stent was difficult to visualize so it is hard to determine how  new this stenosis is Despite one episode of elevated blood pressure that responded well to clonidine, the patient's blood pressure has been well-controlled since right renal artery stenting with max systolic in the 865H.  No medications have been changed by cardiology At this time we will continue to monitor the patient's right renal artery stenosis since her blood pressure is well-controlled with medication management.  We may have to consider intervention in the future if her blood pressure worsens again and is not responsive to medication She can follow-up with our office in 6 months with repeat renal artery duplex   Vicente Serene PA-C [facility] Office: [facility] MD: Trula Slade

## 2022-01-02 ENCOUNTER — Other Ambulatory Visit: Payer: Self-pay

## 2022-01-02 DIAGNOSIS — I701 Atherosclerosis of renal artery: Secondary | ICD-10-CM

## 2022-01-02 DIAGNOSIS — H401111 Primary open-angle glaucoma, right eye, mild stage: Secondary | ICD-10-CM | POA: Diagnosis not present

## 2022-01-03 ENCOUNTER — Ambulatory Visit (INDEPENDENT_AMBULATORY_CARE_PROVIDER_SITE_OTHER): Payer: Medicare PPO

## 2022-01-03 ENCOUNTER — Other Ambulatory Visit: Payer: Self-pay | Admitting: Internal Medicine

## 2022-01-03 DIAGNOSIS — E538 Deficiency of other specified B group vitamins: Secondary | ICD-10-CM

## 2022-01-03 MED ORDER — CYANOCOBALAMIN 1000 MCG/ML IJ SOLN
1000.0000 ug | Freq: Once | INTRAMUSCULAR | Status: AC
  Administered 2022-01-03: 1000 ug via INTRAMUSCULAR

## 2022-01-03 NOTE — Progress Notes (Signed)
Patient here for monthly B12 injection per Dr. Quay Burow. B12 1000 mcg given in left IM and patient tolerated injection well today.

## 2022-01-04 ENCOUNTER — Ambulatory Visit: Payer: Medicare PPO

## 2022-01-05 ENCOUNTER — Encounter: Payer: Self-pay | Admitting: Internal Medicine

## 2022-01-05 DIAGNOSIS — I701 Atherosclerosis of renal artery: Secondary | ICD-10-CM | POA: Insufficient documentation

## 2022-01-17 ENCOUNTER — Encounter: Payer: Self-pay | Admitting: Internal Medicine

## 2022-01-21 NOTE — Progress Notes (Unsigned)
Cardiology Office Note:    Date:  01/24/2022   ID:  Amy [name], DOB 04-Jan-1936, MRN [id number]  PCP:  Binnie Rail, MD   [facility] Providers Cardiologist:  Dorris Carnes, MD     Referring MD: Binnie Rail, MD   Chief Complaint: leg swelling  History of Present Illness:    Amy [name] is a very pleasant 87 y.o. female with a hx of difficult to control HTN, hypothyroidism, chronic renal disease, PAF s/p ablation, and renal artery stenosis.   Normal coronary arteries by catheterization in 2003.  Cardiac CT 2007 demonstrated normal coronary arteries. She underwent renal artery stent placement in 2021.  She presented to the ED on 11/08/21 for evaluation of BP.  She usually checks her BP 3 times daily and when she checked it on the evening of 10/18 it was above 211 systolic.  She was asymptomatic.  She took her nighttime doses of antihypertensives shortly after arriving in the ED.  Was given an additional dose of clonidine with good results.  BP was initially 215/72 and came down to 941 systolic.  Last cardiology clinic visit was 11/09/21 with Nicholes Rough, PA.  BP was elevated at 172/82. She reported redness to her face and ears when BP is elevated.  Renal artery ultrasound 12/2020 revealed probable occlusion of the right renal artery, angiogram was recommended. She underwent PTA/stent 02/13/2021. Follow-up renal artery ultrasound 2/23 showed no evidence of right renal stenosis and no evidence of left renal stenosis. She was advised to increase amlodipine to 5 mg twice daily and continue atenolol 50 mg daily, clonidine 0.1 mg 3 times daily, hydralazine 100 mg 3 times daily and losartan 50 mg twice daily. Advised she could take an extra clonidine 0.1 mg for SBP > 160 mmHg.  Repeat renal artery ultrasound 11/20/2021 revealed > 60% stenosis right renal artery, no left renal artery stenosis with recommendation to be referred to vascular surgery. She initially declined referral but contacted  [place] 12/6 to request referral to Dr. Trula Slade with [facility].   She contacted our office on 01/17/2022 to report by lateral ankle swelling and redness for 2 months.  There was improvement with compression or leg elevation. She reported low sodium diet. She was subsequently placed on my schedule.  Today, she is here for evaluation of bilateral ankle swelling and redness x approximately 1 month. Swelling and redness improve with leg elevation but return as soon as she is back on her feet.  Reports symptoms started when she increased amlodipine to 10 mg daily. SBP typically 120-150 mmHg at home, higher in the afternoons and evenings. Is asymptomatic with elevated BP readings. Remains active at home doing work around the house, does not walk for exercise.  Is considering getting a pedal exerciser. She denies chest pain, shortness of breath, fatigue, palpitations, melena, hematuria, hemoptysis, diaphoresis, weakness, presyncope, syncope, orthopnea, and PND.  Past Medical History:  Diagnosis Date   Anemia    Arthritis    Atrial fibrillation (HCC)    Atrial flutter ([place])    s/p ablation   Atypical mole 09/07/2013   LOWER LEG SEVERE TX= WIDER SHAVE    HTN (hypertension)    Hypothyroidism    Nodular basal cell carcinoma (BCC) 03/29/2020   Right Malar Cheek   Palpitations     Past Surgical History:  Procedure Laterality Date   A FLUTTER ABLATION     KNEE ARTHROSCOPY Right    PERIPHERAL VASCULAR INTERVENTION Right 02/13/2021  Procedure: PERIPHERAL VASCULAR INTERVENTION;  Surgeon: Serafina Mitchell, MD;  Location: [place] [facility];  Service: Cardiovascular;  Laterality: Right;   RENAL ANGIOGRAPHY N/A 02/13/2021   Procedure: RENAL ANGIOGRAPHY;  Surgeon: Serafina Mitchell, MD;  Location: [place] [facility];  Service: Cardiovascular;  Laterality: N/A;   TUBAL LIGATION      Current Medications: Current Meds  Medication Sig   acetaminophen (TYLENOL) 650 MG CR tablet Take 1,300 mg by mouth 2 (two) times  daily as needed for pain.   AMBULATORY NON FORMULARY MEDICATION Take 1 drop by mouth daily as needed (pain). CBD Oil 1 dose under tongue once daily as needed for pain   amoxicillin (AMOXIL) 500 MG capsule Take 500 mg by mouth 3 (three) times daily as needed (For gum flare up).   atenolol (TENORMIN) 50 MG tablet Take 1 tablet (50 mg total) by mouth daily.   Calcium Carbonate-Vitamin D (CALCIUM + D PO) Take 1 tablet by mouth daily.   Cholecalciferol 100 MCG (4000 UT) CAPS Take 1 capsule (4,000 Units total) by mouth daily.   cloNIDine (CATAPRES) 0.1 MG tablet Take 1 tablet (0.1 mg total) by mouth 3 (three) times daily.   dorzolamide-timolol (COSOPT) 22.3-6.8 MG/ML ophthalmic solution Place 1 drop into both eyes 2 (two) times daily.   flecainide (TAMBOCOR) 50 MG tablet TAKE 1 TABLET BY MOUTH TWICE A DAY   hydrALAZINE (APRESOLINE) 100 MG tablet TAKE 1 TABLET BY MOUTH 3 TIMES DAILY.   levothyroxine (SYNTHROID) 50 MCG tablet TAKE 1 TABLET BY MOUTH EVERY DAY   linaclotide (LINZESS) 72 MCG capsule Take 72 mcg by mouth as needed (for constipation).   olmesartan (BENICAR) 40 MG tablet Take 1 tablet (40 mg total) by mouth daily.   prednisoLONE acetate (PRED FORTE) 1 % ophthalmic suspension Place 1 drop into the left eye every 4 (four) hours.   traZODone (DESYREL) 50 MG tablet Take 50 mg by mouth at bedtime as needed for sleep.   XARELTO 15 MG TABS tablet TAKE 1 TABLET BY MOUTH EVERY DAY WITH SUPPER   [DISCONTINUED] amLODipine (NORVASC) 5 MG tablet Take 2 tablets (10 mg total) by mouth daily.   [DISCONTINUED] linaclotide (LINZESS) 72 MCG capsule TAKE 1 CAPSULE BY MOUTH DAILY. (Patient taking differently: Take 72 mcg by mouth as needed (constipation).)   [DISCONTINUED] losartan (COZAAR) 50 MG tablet TAKE 1 TABLET BY MOUTH TWICE A DAY   [DISCONTINUED] olmesartan (BENICAR) 20 MG tablet Take 1 tablet (20 mg total) by mouth daily.     Allergies:   Patient has no known allergies.   Social History    Socioeconomic History   Marital status: Married    Spouse name: Not on file   Number of children: 1   Years of education: Not on file   Highest education level: Not on file  Occupational History   Occupation: Retired  Tobacco Use   Smoking status: Never    Passive exposure: Never   Smokeless tobacco: Never  Vaping Use   Vaping Use: Never used  Substance and Sexual Activity   Alcohol use: No   Drug use: No   Sexual activity: Not on file  Other Topics Concern   Not on file  Social History Narrative   Occ caffeine    Social Determinants of Health   Financial Resource Strain: Low Risk  (06/26/2021)   Overall Financial Resource Strain (CARDIA)    Difficulty of Paying Living Expenses: Not hard at all  Food Insecurity: No Food Insecurity (  06/26/2021)   Hunger Vital Sign    Worried About Running Out of Food in the Last Year: Never true    [place] in the Last Year: Never true  Transportation Needs: No Transportation Needs (06/26/2021)   PRAPARE - Hydrologist (Medical): No    Lack of Transportation (Non-Medical): No  Physical Activity: Sufficiently Active (06/26/2021)   Exercise Vital Sign    Days of Exercise per Week: 5 days    Minutes of Exercise per Session: 30 min  Stress: No Stress Concern Present (06/26/2021)   [place]    Feeling of Stress : Not at all  Social Connections: [place] (06/26/2021)   Social Connection and Isolation Panel [NHANES]    Frequency of Communication with Friends and Family: More than three times a week    Frequency of Social Gatherings with Friends and Family: More than three times a week    Attends Religious Services: More than 4 times per year    Active Member of [employer] or Organizations: Yes    Attends Music therapist: More than 4 times per year    Marital Status: Married     Family History: The patient's family history  includes Colon cancer in her brother; Heart disease in her mother; Stroke (age of onset: 18) in her mother; Stroke (age of onset: 22) in her father.  ROS:   Please see the history of present illness.    + bilateral leg swelling and redness All other systems reviewed and are negative.  Labs/Other Studies Reviewed:    The following studies were reviewed today:  Renal Artery [place] 11/20/21 Summary:  Renal:    Right: Evidence of a > 60% stenosis of the right renal artery. RRV         flow present. Abnormal size for the right kidney. Abnormal         right Resistive Index. Normal cortical thickness of right         kidney.  Left:  No evidence of left renal artery stenosis. LRV flow present.         Abnormal size for the left kidney. Abnormal left Resisitve         Index. Normal cortical thickness of the left kidney.  Mesenteric:  Normal Celiac artery findings. 70 to 99% stenosis in the superior  mesenteric  artery. Areas of limited visceral study include right kidney size, left  kidney  size and right parenchymal flow.    *See table(s) above for measurements and observations.     Recent Labs: 03/05/2021: NT-Pro BNP 623 10/10/2021: TSH 1.740 11/08/2021: BUN 24; Creatinine, Ser 1.45; Hemoglobin 10.8; Platelets 265; Potassium 4.0; Sodium 137  Recent Lipid Panel    Component Value Date/Time   CHOL 219 (H) 10/12/2020 1617   TRIG 85 10/12/2020 1617   HDL 95 10/12/2020 1617   CHOLHDL 2.3 10/12/2020 1617   CHOLHDL 2.0 06/05/2015 0946   VLDL 10 06/05/2015 0946   LDLCALC 109 (H) 10/12/2020 1617   LDLDIRECT 84.9 11/15/2008 0924     Risk Assessment/Calculations:    CHA2DS2-VASc Score = 4  This indicates a 4.8% annual risk of stroke. The patient's score is based upon: CHF History: 0 HTN History: 1 Diabetes History: 0 Stroke History: 0 Vascular Disease History: 0 Age Score: 2 Gender Score: 1    Physical Exam:    VS:  BP (!) 150/62 (BP Location: Right Arm,  Patient Position:  Sitting, Cuff Size: Normal)   Pulse (!) 54   Ht '5\' 7"'$  (1.702 m)   Wt 125 lb (56.7 kg)   LMP  (LMP Unknown)   SpO2 98%   BMI 19.58 kg/m     Wt Readings from Last 3 Encounters:  01/24/22 125 lb (56.7 kg)  12/31/21 123 lb (55.8 kg)  11/26/21 122 lb 3.2 oz (55.4 kg)     GEN:  Well nourished, well developed in no acute distress HEENT: Normal NECK: No JVD; No carotid bruits CARDIAC: RRR, no murmurs, rubs, gallops RESPIRATORY:  Clear to auscultation without rales, wheezing or rhonchi  ABDOMEN: Soft, non-tender, non-distended MUSCULOSKELETAL:  Mild bilateral LE edema and erythema; No deformity. 2+ pedal pulses, equal bilaterally SKIN: Warm and dry NEUROLOGIC:  Alert and oriented x 3 PSYCHIATRIC:  Normal affect   EKG:  EKG is ordered today.  The ekg ordered today demonstrates sinus bradycardia at 54 bpm with first-degree AV block, PR interval 236 ms, RBBB, stable QTc  Diagnoses:    1. Essential hypertension   2. Medication management   3. PAF (paroxysmal atrial fibrillation) ([place])   4. Hyperlipidemia, unspecified hyperlipidemia type   5. Swelling of lower extremity   6. Renal artery stenosis (HCC)    Assessment and Plan:     Leg swelling: Bilateral leg swelling and erythema since increasing dose of amlodipine approximately 1 month ago.  Swelling and redness improves with leg elevation. Will decrease amlodipine from 10 to 5 mg daily. In anticipation of higher BP with this change, will switch losartan to olmesartan 40 mg daily. Encouraged continue leg elevation and to notify us if symptoms do not improve on lower dose of amlodipine.   Hypertension: BP is elevated today. Home SBP typically 120s-130s in the mornings and 150s in the afternoon/evenings. We will reduce amlodipine to 5 mg daily in the setting of leg edema. Will d/c losartan and start olmesartan 40 mg daily. Advised her to monitor home BP 2 hours after morning medications and report if consistently > 138/88. Will check BMP in  2 weeks.   Renal artery stenosis: Right renal artery stent 01/2021. Renal artery duplex 11/20/21 revealed >60% stenosis of right renal artery, no evidence of left renal artery stenosis. Rt renal artery stent patent. 70-99% superior mesenteric stenosis. Had f/u with [facility] 12/30/21.  BP has been overall well controlled with moderate elevations up to 150 mmHg, one episode of BP > 180 mmHg. Advised to continue current antihypertensive regimen and follow-up in 1 year. Management per [facility].   PAF on chronic anticoagulation and AAD: Hx a fib ablation. Has been maintained on flecainide 50 mg twice daily with no recurrence of a fib to her awareness. She is asymptomatic. No bleeding problems on Xarelto.  EKG today reveals sinus bradycardia at 54 bpm, stable QTc. Continue flecainide, atenolol, Xarelto at 15 mg daily (appropriate dose for Cr Cl).      Disposition: Keep your March appointment with Dr. Harrington Challenger  Medication Adjustments/Labs and Tests Ordered: Current medicines are reviewed at length with the patient today.  Concerns regarding medicines are outlined above.  Orders Placed This Encounter  Procedures   EKG 12-Lead   Meds ordered this encounter  Medications   amLODipine (NORVASC) 5 MG tablet    Sig: Take 1 tablet (5 mg total) by mouth daily.    Dispense:  180 tablet    Refill:  3   DISCONTD: olmesartan (BENICAR) 20 MG tablet    Sig: Take 1  tablet (20 mg total) by mouth daily.    Dispense:  30 tablet    Refill:  11   olmesartan (BENICAR) 40 MG tablet    Sig: Take 1 tablet (40 mg total) by mouth daily.    Dispense:  30 tablet    Refill:  11    Order Specific Question:   Supervising Provider    Answer:   Thayer Headings [phone]    Patient Instructions  Medication Instructions:   DECREASE Amlodipine one half (0.5) tablet by mouth ( 5 mg) daily.   DISCONTINUE Losartan  START Olmesartan one (1) tablet by mouth ( 40 mg) daily.    *If you need a refill on your cardiac medications before your  next appointment, please call your pharmacy*   Lab Work:  None ordered.  If you have labs (blood work) drawn today and your tests are completely normal, you will receive your results only by: [place] (if you have MyChart) OR A paper copy in the mail If you have any lab test that is abnormal or we need to change your treatment, we will call you to review the results.   Testing/Procedures:  None ordered.   Follow-Up: At [facility], you and your health needs are our priority.  As part of our continuing mission to provide you with exceptional heart care, we have created designated Provider Care Teams.  These Care Teams include your primary Cardiologist (physician) and Advanced Practice Providers (APPs -  Physician Assistants and Nurse Practitioners) who all work together to provide you with the care you need, when you need it.  We recommend signing up for the patient portal called "MyChart".  Sign up information is provided on this After Visit Summary.  MyChart is used to connect with patients for Virtual Visits (Telemedicine).  Patients are able to view lab/test results, encounter notes, upcoming appointments, etc.  Non-urgent messages can be sent to your provider as well.   To learn more about what you can do with MyChart, go to [email or web address].    Your next appointment:   2 month(s)  The format for your next appointment:   In Person  Provider:   Dorris Carnes, MD     Other Instructions  [place] 5 minutes before taking your blood pressure. Don't  smoke or drink caffeinated beverages for at least 30 minutes before. Take your blood pressure before (not after) you eat. Sit comfortably with your back supported and both feet on the floor ( don't cross your legs). Elevate your arm to heart level on a table or a desk. Use the proper sized cuff.  It should fit smoothly and snugly around your bare upper arm.  There should be   Enough room to slip a fingertip under the cuff.  The bottom edge of the cuff should be 1 inch above the crease Of the elbow. Please monitor your blood pressure once daily 2 hours after your am medication. If you blood pressure Consistently remains above 469 (systolic) top number or over 88 ( diastolic) bottom number X 3 days  Consecutively.  Please call our office at [phone] or send Mychart message.     ----Avoid cold medicines with D or DM at the end of them----     Important Information About Sugar         Signed, Emmaline Life, NP  01/24/2022 4:39 PM    [place]

## 2022-01-23 DIAGNOSIS — H40012 Open angle with borderline findings, low risk, left eye: Secondary | ICD-10-CM | POA: Diagnosis not present

## 2022-01-24 ENCOUNTER — Encounter: Payer: Self-pay | Admitting: Nurse Practitioner

## 2022-01-24 ENCOUNTER — Ambulatory Visit: Payer: Medicare PPO | Attending: Nurse Practitioner | Admitting: Nurse Practitioner

## 2022-01-24 VITALS — BP 150/62 | HR 54 | Ht 67.0 in | Wt 125.0 lb

## 2022-01-24 DIAGNOSIS — E785 Hyperlipidemia, unspecified: Secondary | ICD-10-CM

## 2022-01-24 DIAGNOSIS — Z79899 Other long term (current) drug therapy: Secondary | ICD-10-CM | POA: Diagnosis not present

## 2022-01-24 DIAGNOSIS — I10 Essential (primary) hypertension: Secondary | ICD-10-CM | POA: Diagnosis not present

## 2022-01-24 DIAGNOSIS — M7989 Other specified soft tissue disorders: Secondary | ICD-10-CM | POA: Diagnosis not present

## 2022-01-24 DIAGNOSIS — I701 Atherosclerosis of renal artery: Secondary | ICD-10-CM | POA: Diagnosis not present

## 2022-01-24 DIAGNOSIS — I480 Paroxysmal atrial fibrillation: Secondary | ICD-10-CM | POA: Diagnosis not present

## 2022-01-24 MED ORDER — OLMESARTAN MEDOXOMIL 20 MG PO TABS: 20.0000 mg | ORAL_TABLET | Freq: Every day | ORAL | 11 refills | Status: DC

## 2022-01-24 MED ORDER — AMLODIPINE BESYLATE 5 MG PO TABS: 5.0000 mg | ORAL_TABLET | Freq: Every day | ORAL | 3 refills | Status: DC

## 2022-01-24 MED ORDER — OLMESARTAN MEDOXOMIL 40 MG PO TABS: 40.0000 mg | ORAL_TABLET | Freq: Every day | ORAL | 11 refills | Status: DC

## 2022-01-24 NOTE — Patient Instructions (Addendum)
Medication Instructions:   DECREASE Amlodipine one half (0.5) tablet by mouth ( 5 mg) daily.   DISCONTINUE Losartan  START Olmesartan one (1) tablet by mouth ( 40 mg) daily.    *If you need a refill on your cardiac medications before your next appointment, please call your pharmacy*   Lab Work:  None ordered.  If you have labs (blood work) drawn today and your tests are completely normal, you will receive your results only by: [place] (if you have MyChart) OR A paper copy in the mail If you have any lab test that is abnormal or we need to change your treatment, we will call you to review the results.   Testing/Procedures:  None ordered.   Follow-Up: At [facility], you and your health needs are our priority.  As part of our continuing mission to provide you with exceptional heart care, we have created designated Provider Care Teams.  These Care Teams include your primary Cardiologist (physician) and Advanced Practice Providers (APPs -  Physician Assistants and Nurse Practitioners) who all work together to provide you with the care you need, when you need it.  We recommend signing up for the patient portal called "MyChart".  Sign up information is provided on this After Visit Summary.  MyChart is used to connect with patients for Virtual Visits (Telemedicine).  Patients are able to view lab/test results, encounter notes, upcoming appointments, etc.  Non-urgent messages can be sent to your provider as well.   To learn more about what you can do with MyChart, go to [email or web address].    Your next appointment:   2 month(s)  The format for your next appointment:   In Person  Provider:   Dorris Carnes, MD     Other Instructions  [place] 5 minutes before taking your blood pressure. Don't  smoke or drink caffeinated beverages for at least 30 minutes before. Take your blood pressure before (not after) you eat. Sit comfortably  with your back supported and both feet on the floor ( don't cross your legs). Elevate your arm to heart level on a table or a desk. Use the proper sized cuff.  It should fit smoothly and snugly around your bare upper arm.  There should be  Enough room to slip a fingertip under the cuff.  The bottom edge of the cuff should be 1 inch above the crease Of the elbow. Please monitor your blood pressure once daily 2 hours after your am medication. If you blood pressure Consistently remains above 573 (systolic) top number or over 88 ( diastolic) bottom number X 3 days  Consecutively.  Please call our office at [phone] or send Mychart message.     ----Avoid cold medicines with D or DM at the end of them----     Important Information About Sugar

## 2022-01-25 ENCOUNTER — Other Ambulatory Visit: Payer: Self-pay | Admitting: *Deleted

## 2022-01-25 ENCOUNTER — Telehealth: Payer: Self-pay | Admitting: *Deleted

## 2022-01-25 DIAGNOSIS — Z79899 Other long term (current) drug therapy: Secondary | ICD-10-CM

## 2022-01-25 NOTE — Telephone Encounter (Signed)
Sw/ pt Per Lorenda Peck message due to medication changes yesterday at ov.  Pt will need BMET in two weeks.  Orders placed in system, lab appt made and linked.

## 2022-01-28 ENCOUNTER — Other Ambulatory Visit: Payer: Self-pay | Admitting: Internal Medicine

## 2022-01-28 NOTE — Telephone Encounter (Signed)
Prescription refill request for Xarelto received.  Indication: PAF Last office visit: 01/24/22  Elwyn Reach NP Weight: 56.7kg Age: 87 Scr: 1.45 on 11/08/21 CrCl: 30.41  Based on above findings Xarelto '15mg'$  daily is the appropriate dose.  Refill approved.

## 2022-02-04 ENCOUNTER — Other Ambulatory Visit: Payer: Self-pay | Admitting: Internal Medicine

## 2022-02-06 ENCOUNTER — Ambulatory Visit (INDEPENDENT_AMBULATORY_CARE_PROVIDER_SITE_OTHER): Payer: Medicare PPO

## 2022-02-06 ENCOUNTER — Other Ambulatory Visit: Payer: Self-pay | Admitting: Nurse Practitioner

## 2022-02-06 DIAGNOSIS — E538 Deficiency of other specified B group vitamins: Secondary | ICD-10-CM

## 2022-02-06 DIAGNOSIS — D649 Anemia, unspecified: Secondary | ICD-10-CM

## 2022-02-06 DIAGNOSIS — Z79899 Other long term (current) drug therapy: Secondary | ICD-10-CM

## 2022-02-06 MED ORDER — CYANOCOBALAMIN 1000 MCG/ML IJ SOLN
1000.0000 ug | Freq: Once | INTRAMUSCULAR | Status: AC
  Administered 2022-02-06: 1000 ug via INTRAMUSCULAR

## 2022-02-06 NOTE — Progress Notes (Signed)
After obtaining consent, and per orders of Dr. Burns, injection of B12 given by Yarenis Cerino P Ailynn Gow. Patient instructed to report any adverse reaction to me immediately.  

## 2022-02-07 ENCOUNTER — Ambulatory Visit: Payer: Medicare PPO | Attending: Nurse Practitioner

## 2022-02-07 DIAGNOSIS — Z79899 Other long term (current) drug therapy: Secondary | ICD-10-CM | POA: Diagnosis not present

## 2022-02-07 DIAGNOSIS — D649 Anemia, unspecified: Secondary | ICD-10-CM | POA: Diagnosis not present

## 2022-02-08 LAB — VITAMIN B12: Vitamin B-12: >2000 pg/mL — ABNORMAL HIGH (ref 232–1245)

## 2022-02-08 LAB — BASIC METABOLIC PANEL
BUN/Creatinine Ratio: 20 (ref 12–28)
BUN: 32 mg/dL — ABNORMAL HIGH (ref 8–27)
CO2: 19 mmol/L — ABNORMAL LOW (ref 20–29)
Calcium: 9.4 mg/dL (ref 8.7–10.3)
Chloride: 103 mmol/L (ref 96–106)
Creatinine, Ser: 1.63 mg/dL — ABNORMAL HIGH (ref 0.57–1.00)
Glucose: 83 mg/dL (ref 70–99)
Potassium: 4.8 mmol/L (ref 3.5–5.2)
Sodium: 139 mmol/L (ref 134–144)
eGFR: 31 mL/min/{1.73_m2} — ABNORMAL LOW (ref >59)

## 2022-02-14 ENCOUNTER — Emergency Department (HOSPITAL_COMMUNITY): Payer: Medicare PPO

## 2022-02-14 ENCOUNTER — Other Ambulatory Visit: Payer: Self-pay

## 2022-02-14 ENCOUNTER — Observation Stay (HOSPITAL_COMMUNITY)
Admission: EM | Admit: 2022-02-14 | Discharge: 2022-02-16 | Disposition: A | Discharge status: Home / Self Care | Payer: Medicare PPO | Attending: Internal Medicine | Admitting: Internal Medicine

## 2022-02-14 ENCOUNTER — Encounter (HOSPITAL_COMMUNITY): Payer: Self-pay | Admitting: Emergency Medicine

## 2022-02-14 ENCOUNTER — Emergency Department: Payer: Self-pay

## 2022-02-14 DIAGNOSIS — R55 Syncope and collapse: Secondary | ICD-10-CM | POA: Diagnosis not present

## 2022-02-14 DIAGNOSIS — Z7901 Long term (current) use of anticoagulants: Secondary | ICD-10-CM | POA: Diagnosis not present

## 2022-02-14 DIAGNOSIS — W1830XA Fall on same level, unspecified, initial encounter: Secondary | ICD-10-CM | POA: Insufficient documentation

## 2022-02-14 DIAGNOSIS — S0292XA Unspecified fracture of facial bones, initial encounter for closed fracture: Secondary | ICD-10-CM | POA: Diagnosis not present

## 2022-02-14 DIAGNOSIS — I10 Essential (primary) hypertension: Secondary | ICD-10-CM | POA: Diagnosis not present

## 2022-02-14 DIAGNOSIS — S0285XA Fracture of orbit, unspecified, initial encounter for closed fracture: Secondary | ICD-10-CM | POA: Insufficient documentation

## 2022-02-14 DIAGNOSIS — Z8679 Personal history of other diseases of the circulatory system: Secondary | ICD-10-CM

## 2022-02-14 DIAGNOSIS — S0083XA Contusion of other part of head, initial encounter: Secondary | ICD-10-CM | POA: Diagnosis not present

## 2022-02-14 DIAGNOSIS — W1839XA Other fall on same level, initial encounter: Secondary | ICD-10-CM | POA: Diagnosis not present

## 2022-02-14 DIAGNOSIS — S02401A Maxillary fracture, unspecified, initial encounter for closed fracture: Secondary | ICD-10-CM | POA: Insufficient documentation

## 2022-02-14 DIAGNOSIS — S01112A Laceration without foreign body of left eyelid and periocular area, initial encounter: Secondary | ICD-10-CM | POA: Diagnosis not present

## 2022-02-14 DIAGNOSIS — I480 Paroxysmal atrial fibrillation: Secondary | ICD-10-CM | POA: Insufficient documentation

## 2022-02-14 DIAGNOSIS — Z79899 Other long term (current) drug therapy: Secondary | ICD-10-CM | POA: Insufficient documentation

## 2022-02-14 DIAGNOSIS — S0231XA Fracture of orbital floor, right side, initial encounter for closed fracture: Secondary | ICD-10-CM | POA: Diagnosis not present

## 2022-02-14 DIAGNOSIS — S42402A Unspecified fracture of lower end of left humerus, initial encounter for closed fracture: Secondary | ICD-10-CM | POA: Diagnosis not present

## 2022-02-14 DIAGNOSIS — S52022A Displaced fracture of olecranon process without intraarticular extension of left ulna, initial encounter for closed fracture: Secondary | ICD-10-CM | POA: Diagnosis not present

## 2022-02-14 DIAGNOSIS — I701 Atherosclerosis of renal artery: Secondary | ICD-10-CM | POA: Insufficient documentation

## 2022-02-14 DIAGNOSIS — I160 Hypertensive urgency: Principal | ICD-10-CM

## 2022-02-14 DIAGNOSIS — S0240DA Maxillary fracture, left side, initial encounter for closed fracture: Secondary | ICD-10-CM | POA: Diagnosis not present

## 2022-02-14 DIAGNOSIS — E039 Hypothyroidism, unspecified: Secondary | ICD-10-CM | POA: Diagnosis not present

## 2022-02-14 DIAGNOSIS — Z9181 History of falling: Secondary | ICD-10-CM | POA: Diagnosis not present

## 2022-02-14 DIAGNOSIS — Y9301 Activity, walking, marching and hiking: Secondary | ICD-10-CM | POA: Diagnosis not present

## 2022-02-14 DIAGNOSIS — W19XXXA Unspecified fall, initial encounter: Secondary | ICD-10-CM

## 2022-02-14 DIAGNOSIS — R040 Epistaxis: Secondary | ICD-10-CM | POA: Diagnosis not present

## 2022-02-14 DIAGNOSIS — M25522 Pain in left elbow: Secondary | ICD-10-CM | POA: Diagnosis not present

## 2022-02-14 HISTORY — DX: Atherosclerosis of renal artery: I70.1

## 2022-02-14 LAB — CBC WITH DIFFERENTIAL/PLATELET
Abs Immature Granulocytes: 0.03 10*3/uL (ref 0.00–0.07)
Basophils Absolute: 0 10*3/uL (ref 0.0–0.1)
Basophils Relative: 1 %
Eosinophils Absolute: 0.1 10*3/uL (ref 0.0–0.5)
Eosinophils Relative: 1 %
HCT: 32.9 % — ABNORMAL LOW (ref 36.0–46.0)
Hemoglobin: 10.6 g/dL — ABNORMAL LOW (ref 12.0–15.0)
Immature Granulocytes: 1 %
Lymphocytes Relative: 16 %
Lymphs Abs: 0.9 10*3/uL (ref 0.7–4.0)
MCH: 32.4 pg (ref 26.0–34.0)
MCHC: 32.2 g/dL (ref 30.0–36.0)
MCV: 100.6 fL — ABNORMAL HIGH (ref 80.0–100.0)
Monocytes Absolute: 0.9 10*3/uL (ref 0.1–1.0)
Monocytes Relative: 16 %
Neutro Abs: 3.8 10*3/uL (ref 1.7–7.7)
Neutrophils Relative %: 65 %
Platelets: 224 10*3/uL (ref 150–400)
RBC: 3.27 MIL/uL — ABNORMAL LOW (ref 3.87–5.11)
RDW: 13.2 % (ref 11.5–15.5)
WBC: 5.7 10*3/uL (ref 4.0–10.5)
nRBC: 0 % (ref 0.0–0.2)

## 2022-02-14 LAB — BASIC METABOLIC PANEL
Anion gap: 11 (ref 5–15)
BUN: 25 mg/dL — ABNORMAL HIGH (ref 8–23)
CO2: 23 mmol/L (ref 22–32)
Calcium: 9.8 mg/dL (ref 8.9–10.3)
Chloride: 102 mmol/L (ref 98–111)
Creatinine, Ser: 1.49 mg/dL — ABNORMAL HIGH (ref 0.44–1.00)
GFR, Estimated: 34 mL/min — ABNORMAL LOW (ref >60)
Glucose, Bld: 103 mg/dL — ABNORMAL HIGH (ref 70–99)
Potassium: 4.2 mmol/L (ref 3.5–5.1)
Sodium: 136 mmol/L (ref 135–145)

## 2022-02-14 LAB — TROPONIN I (HIGH SENSITIVITY): Troponin I (High Sensitivity): 9 ng/L (ref <18)

## 2022-02-14 MED ORDER — HYDRALAZINE HCL 20 MG/ML IJ SOLN
10.0000 mg | Freq: Once | INTRAMUSCULAR | Status: DC
  Filled 2022-02-14: qty 1

## 2022-02-14 MED ORDER — FENTANYL CITRATE PF 50 MCG/ML IJ SOSY
50.0000 ug | PREFILLED_SYRINGE | Freq: Once | INTRAMUSCULAR | Status: AC
  Administered 2022-02-14: 50 ug via INTRAVENOUS
  Filled 2022-02-14: qty 1

## 2022-02-14 MED ORDER — LIDOCAINE-EPINEPHRINE-TETRACAINE (LET) TOPICAL GEL
3.0000 mL | Freq: Once | TOPICAL | Status: AC
  Administered 2022-02-14: 3 mL via TOPICAL
  Filled 2022-02-14: qty 3

## 2022-02-14 MED ORDER — CLONIDINE HCL 0.1 MG PO TABS
0.1000 mg | ORAL_TABLET | Freq: Once | ORAL | Status: AC
  Administered 2022-02-14: 0.1 mg via ORAL
  Filled 2022-02-14: qty 1

## 2022-02-14 MED ORDER — SODIUM CHLORIDE 0.9 % IV BOLUS
1000.0000 mL | Freq: Once | INTRAVENOUS | Status: AC
  Administered 2022-02-14: 1000 mL via INTRAVENOUS

## 2022-02-14 NOTE — ED Notes (Signed)
Deep breathing relaxation techniques demonstrated and discussed with pt- pt attempting these exercises at this time.

## 2022-02-14 NOTE — ED Notes (Addendum)
Pt has drank one 8 oz cup of water and given another to drink, pt unhooked from monitor to use bathroom, which is in pt room - spouse at bedside. Pt missed urine cup, unable to obtain sample

## 2022-02-14 NOTE — ED Notes (Addendum)
Around 2230, Pt had signed for discharge at the nurse's station and was walking down the hallway towards the exit. According to pt's husband, he spoke to a staff person that was passing by and by the time he turned his head back towards the pt, pt was falling to the ground. This RN heard a noise and at the same time, said staff person was letting this RN know a person fell in the hallway. Upon assessment, pt was sitting up in the floor with noticeable bleeding coming from a laceration above left eye and nose bleeding. This RN called for assistance. Stretcher brought, EDP made aware, other ED staff came to assist. Pt placed on stretcher, bleeding controlled, EDP assessed, pt took back to room, EDP put in orders, pt's RN stayed with pt

## 2022-02-14 NOTE — ED Triage Notes (Signed)
Noted elevated BP's at home, denies any HA or CP. Pt states she is taking her HTN meds as prescribed and states she took an extra Clonidine PTA as instructed per PCP

## 2022-02-14 NOTE — ED Notes (Signed)
Pt taken to CT/xray

## 2022-02-14 NOTE — ED Provider Notes (Signed)
[facility] Provider Note   CSN: [id number] Arrival date & time: 02/14/22  1847     History  Chief Complaint  Patient presents with   Hypertension    LUCIEL BRICKMAN is a 87 y.o. female.  She is brought in by her husband for evaluation of elevated blood pressure.  She has a history of difficult to control hypertension.  She said she checked it this afternoon and it was over 366 systolic.  She took a dose of clonidine per recommendations of PCP.  This was around 5:30 PM.  Blood pressure remains high.  She is not experiencing any symptoms now but she said around 4 AM this morning she had indigestion with pain in her jaw that lasted about an hour.  She denies any headache blurry vision chest pain nausea vomiting diarrhea or urinary symptoms.  She has been taking her medications regularly.  On review of her recent cardiology notes they are weaning her down off her amlodipine due to ankle swelling and have changed her losartan to olmesartan 40.  This was about 3 weeks ago.  The history is provided by the patient.  Hypertension This is a chronic problem. The problem has not changed since onset.Associated symptoms include chest pain. Pertinent negatives include no abdominal pain, no headaches and no shortness of breath. Nothing aggravates the symptoms. Nothing relieves the symptoms. Treatments tried: dose of clonidine. The treatment provided no relief.       Home Medications Prior to Admission medications   Medication Sig Start Date End Date Taking? Authorizing Provider  acetaminophen (TYLENOL) 650 MG CR tablet Take 1,300 mg by mouth 2 (two) times daily as needed for pain.    [provider]  AMBULATORY NON FORMULARY MEDICATION Take 1 drop by mouth daily as needed (pain). CBD Oil 1 dose under tongue once daily as needed for pain    [provider]  amLODipine (NORVASC) 5 MG tablet Take 1 tablet (5 mg total) by mouth daily. 01/24/22    Swinyer, Lanice Schwab, NP  amoxicillin (AMOXIL) 500 MG capsule Take 500 mg by mouth 3 (three) times daily as needed (For gum flare up).    [provider]  atenolol (TENORMIN) 50 MG tablet Take 1 tablet (50 mg total) by mouth daily. 01/04/22   Fay Records, MD  Calcium Carbonate-Vitamin D (CALCIUM + D PO) Take 1 tablet by mouth daily.    [provider]  Cholecalciferol 100 MCG (4000 UT) CAPS Take 1 capsule (4,000 Units total) by mouth daily. 01/27/18   Fay Records, MD  cloNIDine (CATAPRES) 0.1 MG tablet Take 1 tablet (0.1 mg total) by mouth 3 (three) times daily. 02/04/22   Fay Records, MD  dorzolamide-timolol (COSOPT) 22.3-6.8 MG/ML ophthalmic solution Place 1 drop into both eyes 2 (two) times daily. 06/22/19   [provider]  flecainide (TAMBOCOR) 50 MG tablet TAKE 1 TABLET BY MOUTH TWICE A DAY 05/10/21   Fay Records, MD  hydrALAZINE (APRESOLINE) 100 MG tablet TAKE 1 TABLET BY MOUTH 3 TIMES DAILY. 12/18/21   Fay Records, MD  levothyroxine (SYNTHROID) 50 MCG tablet TAKE 1 TABLET BY MOUTH EVERY DAY 05/10/21   Fay Records, MD  linaclotide [facility]) 72 MCG capsule Take 72 mcg by mouth as needed (for constipation).    [provider]  olmesartan (BENICAR) 40 MG tablet Take 1 tablet (40 mg total) by mouth daily. 01/24/22   Swinyer, Lanice Schwab, NP  prednisoLONE acetate (PRED FORTE) 1 % ophthalmic suspension Place 1 drop into the left eye every 4 (four) hours. 01/23/22   [provider]  traZODone (DESYREL) 50 MG tablet Take 50 mg by mouth at bedtime as needed for sleep. 12/18/21   [provider]  XARELTO 15 MG TABS tablet TAKE 1 TABLET BY MOUTH EVERY DAY WITH SUPPER 01/28/22   Fay Records, MD      Allergies    Patient has no known allergies.    Review of Systems   Review of Systems  Constitutional:  Negative for fever.  HENT:  Negative for sore throat.   Respiratory:  Negative for shortness of breath.   Cardiovascular:  Positive for chest  pain.  Gastrointestinal:  Negative for abdominal pain.  Genitourinary:  Negative for dysuria.  Skin:  Negative for rash.  Neurological:  Negative for headaches.    Physical Exam Updated Vital Signs BP (!) 205/89   Pulse 72   Temp 97.8 F (36.6 C) (Oral)   Resp 18   Ht '5\' 7"'$  (1.702 m)   Wt 56.7 kg   LMP  (LMP Unknown)   SpO2 95%   BMI 19.58 kg/m  Physical Exam Vitals and nursing note reviewed.  Constitutional:      General: She is not in acute distress.    Appearance: Normal appearance. She is well-developed.  HENT:     Head: Normocephalic and atraumatic.  Eyes:     Conjunctiva/sclera: Conjunctivae normal.  Cardiovascular:     Rate and Rhythm: Normal rate and regular rhythm.     Heart sounds: No murmur heard. Pulmonary:     Effort: Pulmonary effort is normal. No respiratory distress.     Breath sounds: Normal breath sounds.  Abdominal:     Palpations: Abdomen is soft.     Tenderness: There is no abdominal tenderness. There is no guarding or rebound.  Musculoskeletal:        General: No deformity. Normal range of motion.     Cervical back: Neck supple.  Skin:    General: Skin is warm and dry.     Capillary Refill: Capillary refill takes less than 2 seconds.  Neurological:     General: No focal deficit present.     Mental Status: She is alert and oriented to person, place, and time.     Cranial Nerves: No cranial nerve deficit.     Sensory: No sensory deficit.     Motor: No weakness.     ED Results / Procedures / Treatments   Labs (all labs ordered are listed, but only abnormal results are displayed) Labs Reviewed  BASIC METABOLIC PANEL - Abnormal; Notable for the following components:      Result Value   Glucose, Bld 103 (*)    BUN 25 (*)    Creatinine, Ser 1.49 (*)    GFR, Estimated 34 (*)    All other components within normal limits  CBC WITH DIFFERENTIAL/PLATELET - Abnormal; Notable for the following components:   RBC 3.27 (*)    Hemoglobin 10.6 (*)     HCT 32.9 (*)    MCV 100.6 (*)    All other components within normal limits  COMPREHENSIVE METABOLIC PANEL - Abnormal; Notable for the following components:   Potassium 3.4 (*)    CO2 20 (*)    Glucose, Bld 140 (*)    BUN 24 (*)    Creatinine, Ser 1.27 (*)    GFR, Estimated 42 (*)  All other components within normal limits  CBC - Abnormal; Notable for the following components:   WBC 11.5 (*)    RBC 2.91 (*)    Hemoglobin 9.6 (*)    HCT 29.5 (*)    MCV 101.4 (*)    All other components within normal limits  MAGNESIUM - Abnormal; Notable for the following components:   Magnesium 1.6 (*)    All other components within normal limits  PHOSPHORUS  TSH  URINALYSIS, ROUTINE W REFLEX MICROSCOPIC  TROPONIN I (HIGH SENSITIVITY)    EKG EKG Interpretation  Date/Time:  Thursday February 14 2022 19:14:02 EST Ventricular Rate:  70 PR Interval:  55 QRS Duration: 179 QT Interval:  439 QTC Calculation: 474 R Axis:   94 Text Interpretation: Sinus rhythm Short PR interval RBBB and LPFB Baseline wander in lead(s) I II aVR V2 No significant change since prior Confirmed by Aletta Edouard [phone]) on 02/14/2022 7:33:45 PM  Radiology CT Head Wo Contrast  Result Date: 02/14/2022 CLINICAL DATA:  Facial trauma. EXAM: CT HEAD WITHOUT CONTRAST CT MAXILLOFACIAL WITHOUT CONTRAST CT CERVICAL SPINE WITHOUT CONTRAST TECHNIQUE: Multidetector CT imaging of the head, cervical spine, and maxillofacial structures were performed using the standard protocol without intravenous contrast. Multiplanar CT image reconstructions of the cervical spine and maxillofacial structures were also generated. RADIATION DOSE REDUCTION: This exam was performed according to the departmental dose-optimization program which includes automated exposure control, adjustment of the mA and/or kV according to patient size and/or use of iterative reconstruction technique. COMPARISON:  None Available. FINDINGS: CT HEAD FINDINGS Brain: The  ventricles and sulci appropriate size for patient's age. The gray-white matter discrimination is preserved. There is no acute intracranial hemorrhage. No mass effect or midline shift. No extra-axial fluid collection. Vascular: No hyperdense vessel or unexpected calcification. Skull: Normal. Negative for fracture or focal lesion. Other: None CT MAXILLOFACIAL FINDINGS Osseous: Nondisplaced fracture of the anterior and posterolateral walls of the left maxillary sinus. There is mildly displaced fracture of the left orbital floor. No mandibular dislocation. Orbits: The globes and retro-orbital fat are preserved. Sinuses: There is blood product within the left maxillary sinus and several ethmoid air cells. Soft tissues: Mild left periorbital hematoma. CT CERVICAL SPINE FINDINGS Alignment: No acute subluxation. There is reversal of normal cervical lordosis which may be positional or due to muscle spasm. There is grade 1 C3-C4 anterolisthesis. Skull base and vertebrae: No acute fracture.  Osteopenia. Soft tissues and spinal canal: No prevertebral fluid or swelling. No visible canal hematoma. Disc levels: No acute findings. Multilevel degenerative changes and facet arthropathy. Upper chest: Negative. Other: Bilateral carotid bulb calcified plaques. IMPRESSION: 1. No acute intracranial pathology. 2. No acute/traumatic cervical spine pathology. 3. Nondisplaced fracture of the anterior and posterolateral walls of the left maxillary sinus. Mildly displaced fracture of the left orbital floor. Electronically Signed   By: Anner Crete M.D.   On: 02/14/2022 23:26   CT Maxillofacial WO CM  Result Date: 02/14/2022 CLINICAL DATA:  Facial trauma. EXAM: CT HEAD WITHOUT CONTRAST CT MAXILLOFACIAL WITHOUT CONTRAST CT CERVICAL SPINE WITHOUT CONTRAST TECHNIQUE: Multidetector CT imaging of the head, cervical spine, and maxillofacial structures were performed using the standard protocol without intravenous contrast. Multiplanar CT image  reconstructions of the cervical spine and maxillofacial structures were also generated. RADIATION DOSE REDUCTION: This exam was performed according to the departmental dose-optimization program which includes automated exposure control, adjustment of the mA and/or kV according to patient size and/or use of iterative reconstruction technique. COMPARISON:  None Available.  FINDINGS: CT HEAD FINDINGS Brain: The ventricles and sulci appropriate size for patient's age. The gray-white matter discrimination is preserved. There is no acute intracranial hemorrhage. No mass effect or midline shift. No extra-axial fluid collection. Vascular: No hyperdense vessel or unexpected calcification. Skull: Normal. Negative for fracture or focal lesion. Other: None CT MAXILLOFACIAL FINDINGS Osseous: Nondisplaced fracture of the anterior and posterolateral walls of the left maxillary sinus. There is mildly displaced fracture of the left orbital floor. No mandibular dislocation. Orbits: The globes and retro-orbital fat are preserved. Sinuses: There is blood product within the left maxillary sinus and several ethmoid air cells. Soft tissues: Mild left periorbital hematoma. CT CERVICAL SPINE FINDINGS Alignment: No acute subluxation. There is reversal of normal cervical lordosis which may be positional or due to muscle spasm. There is grade 1 C3-C4 anterolisthesis. Skull base and vertebrae: No acute fracture.  Osteopenia. Soft tissues and spinal canal: No prevertebral fluid or swelling. No visible canal hematoma. Disc levels: No acute findings. Multilevel degenerative changes and facet arthropathy. Upper chest: Negative. Other: Bilateral carotid bulb calcified plaques. IMPRESSION: 1. No acute intracranial pathology. 2. No acute/traumatic cervical spine pathology. 3. Nondisplaced fracture of the anterior and posterolateral walls of the left maxillary sinus. Mildly displaced fracture of the left orbital floor. Electronically Signed   By: Anner Crete M.D.   On: 02/14/2022 23:26   CT Cervical Spine Wo Contrast  Result Date: 02/14/2022 CLINICAL DATA:  Facial trauma. EXAM: CT HEAD WITHOUT CONTRAST CT MAXILLOFACIAL WITHOUT CONTRAST CT CERVICAL SPINE WITHOUT CONTRAST TECHNIQUE: Multidetector CT imaging of the head, cervical spine, and maxillofacial structures were performed using the standard protocol without intravenous contrast. Multiplanar CT image reconstructions of the cervical spine and maxillofacial structures were also generated. RADIATION DOSE REDUCTION: This exam was performed according to the departmental dose-optimization program which includes automated exposure control, adjustment of the mA and/or kV according to patient size and/or use of iterative reconstruction technique. COMPARISON:  None Available. FINDINGS: CT HEAD FINDINGS Brain: The ventricles and sulci appropriate size for patient's age. The gray-white matter discrimination is preserved. There is no acute intracranial hemorrhage. No mass effect or midline shift. No extra-axial fluid collection. Vascular: No hyperdense vessel or unexpected calcification. Skull: Normal. Negative for fracture or focal lesion. Other: None CT MAXILLOFACIAL FINDINGS Osseous: Nondisplaced fracture of the anterior and posterolateral walls of the left maxillary sinus. There is mildly displaced fracture of the left orbital floor. No mandibular dislocation. Orbits: The globes and retro-orbital fat are preserved. Sinuses: There is blood product within the left maxillary sinus and several ethmoid air cells. Soft tissues: Mild left periorbital hematoma. CT CERVICAL SPINE FINDINGS Alignment: No acute subluxation. There is reversal of normal cervical lordosis which may be positional or due to muscle spasm. There is grade 1 C3-C4 anterolisthesis. Skull base and vertebrae: No acute fracture.  Osteopenia. Soft tissues and spinal canal: No prevertebral fluid or swelling. No visible canal hematoma. Disc levels: No  acute findings. Multilevel degenerative changes and facet arthropathy. Upper chest: Negative. Other: Bilateral carotid bulb calcified plaques. IMPRESSION: 1. No acute intracranial pathology. 2. No acute/traumatic cervical spine pathology. 3. Nondisplaced fracture of the anterior and posterolateral walls of the left maxillary sinus. Mildly displaced fracture of the left orbital floor. Electronically Signed   By: Anner Crete M.D.   On: 02/14/2022 23:26   DG Elbow Complete Left  Result Date: 02/14/2022 CLINICAL DATA:  Fall, left elbow pain EXAM: LEFT ELBOW - COMPLETE 3+ VIEW COMPARISON:  None Available. FINDINGS:  There is an acute comminuted intra-articular fracture of the olecranonl the proximally 3 mm distraction of the major fracture fragments. No significant articular incongruity. Normal alignment. Large left elbow effusion. Mild soft tissue swelling superficial to the olecranon. IMPRESSION: 1. Acute comminuted intra-articular olecranon fracture. Electronically Signed   By: Fidela Salisbury M.D.   On: 02/14/2022 23:22    Procedures .Marland KitchenLaceration Repair  Date/Time: 02/15/2022 12:23 AM  Performed by: Hayden Rasmussen, MD Authorized by: Hayden Rasmussen, MD   Consent:    Consent obtained:  Verbal   Consent given by:  Patient   Risks, benefits, and alternatives were discussed: yes     Risks discussed:  Infection, nerve damage, poor wound healing, pain, retained foreign body, tendon damage and vascular damage   Alternatives discussed:  No treatment, delayed treatment and referral Universal protocol:    Procedure explained and questions answered to patient or proxy's satisfaction: yes     Patient identity confirmed:  Verbally with patient Anesthesia:    Anesthesia method:  Local infiltration   Local anesthetic:  Lidocaine 2% WITH epi Laceration details:    Location:  Face   Face location:  L eyebrow   Length (cm):  3 Pre-procedure details:    Preparation:  Patient was prepped and draped  in usual sterile fashion Treatment:    Area cleansed with:  Saline   Amount of cleaning:  Standard   Irrigation solution:  Sterile saline   Debridement:  None Skin repair:    Repair method:  Sutures   Suture size:  5-0   Suture material:  Prolene   Suture technique:  Simple interrupted   Number of sutures:  3 Approximation:    Approximation:  Close Repair type:    Repair type:  Simple Post-procedure details:    Dressing:  Non-adherent dressing   Procedure completion:  Tolerated well, no immediate complications     Medications Ordered in ED Medications  acetaminophen (TYLENOL) tablet 650 mg (650 mg Oral Given 02/15/22 0538)    Or  acetaminophen (TYLENOL) suppository 650 mg ( Rectal See Alternative 02/15/22 0538)  ondansetron (ZOFRAN) tablet 4 mg (has no administration in time range)    Or  ondansetron (ZOFRAN) injection 4 mg (has no administration in time range)  melatonin tablet 6 mg (6 mg Oral Given 02/15/22 0158)  levothyroxine (SYNTHROID) tablet 50 mcg (50 mcg Oral Given 02/15/22 0538)  Rivaroxaban (XARELTO) tablet 15 mg (15 mg Oral Patient Refused/Not Given 02/15/22 1010)  atenolol (TENORMIN) tablet 50 mg (50 mg Oral Given 02/15/22 1010)  cloNIDine (CATAPRES) tablet 0.1 mg (0.1 mg Oral Given 02/15/22 1011)  flecainide (TAMBOCOR) tablet 50 mg (50 mg Oral Given 02/15/22 1011)  hydrALAZINE (APRESOLINE) tablet 100 mg (100 mg Oral Given 02/15/22 1009)  amLODipine (NORVASC) tablet 5 mg (5 mg Oral Given 02/15/22 1011)  cloNIDine (CATAPRES) tablet 0.1 mg (0.1 mg Oral Given 02/14/22 2005)  sodium chloride 0.9 % bolus 1,000 mL (0 mLs Intravenous Stopped 02/15/22 0041)  lidocaine-EPINEPHrine-tetracaine (LET) topical gel (3 mLs Topical Given 02/14/22 2355)  fentaNYL (SUBLIMAZE) injection 50 mcg (50 mcg Intravenous Given 02/14/22 2356)  lidocaine-EPINEPHrine (XYLOCAINE W/EPI) 2 %-1:200000 (PF) injection 10 mL (10 mLs Infiltration Given by Other 02/15/22 0020)  HYDROmorphone (DILAUDID) injection  0.5 mg (0.5 mg Intravenous Given 02/15/22 0159)  potassium chloride SA (KLOR-CON M) CR tablet 20 mEq (20 mEq Oral Given 02/15/22 1011)    ED Course/ Medical Decision Making/ A&P Clinical Course as of 02/15/22 1020  Thu Feb 14, 2022  2001 CBC with Differential(!) [MB]  2203 Reviewed results with patient and husband.  She is not having any signs of endorgan damage so do not feel she needs further medication, do not wish to overcorrect her and cause her to be symptomatic.  She understands to take her medications when she gets home and continue to record and follow-up with her PCP and cardiologist. [MB]  2233 Was discharged and was walking down the hallway with her husband on the way out when she fell.  Unclear if she had syncopal events or just tripped.  She did hit her head and has a laceration above her left eye and some bleeding from her nose.  She does not feel dizzy now denies any chest pain. [MB]  2334 CT shows maxillary anterior and posterior wall fracture and orbital floor fracture.  Head CT is otherwise negative.  CT C-spine negative.  X-rays left elbow showing olecranon fracture.  She will need outpatient follow-up with ENT and orthopedics.  No evidence of orbital entrapment at this time. [MB]  2340 Discussed with Dr. Aline Brochure orthopedics.  He is recommending long-arm splint and can follow-up outpatient in the office. [MB]  Fri Feb 15, 2022  0030 Discussed with [facility] hospitalist Dr. Josephine Cables who will evaluate the patient for admission. [MB]    Clinical Course User Index [MB] Hayden Rasmussen, MD                             Medical Decision Making Amount and/or Complexity of Data Reviewed Labs: ordered. Decision-making details documented in ED Course. Radiology: ordered.  Risk Prescription drug management. Decision regarding hospitalization.   This patient complains of elevated blood pressure; this involves an extensive number of treatment Options and is a complaint that carries  with it a high risk of complications and morbidity. The differential includes primary hypertension, hypertensive emergency, urgency, stroke, bleed, metabolic derangement, renal artery stenosis, dehydration  I ordered, reviewed and interpreted labs, which included CBC with normal white count, hemoglobin slightly down from priors chemistries with some CKD similar to prior, troponin unremarkable I ordered medication oral clonidine and reviewed PMP when indicated. I ordered imaging studies which included CT head face cervical spine and elbow and I independently    visualized and interpreted imaging which showed fractures of maxillary sinus fracture orbital floor, left olecranon fracture Additional history obtained from patient's husband Previous records obtained and reviewed in epic and recent cardiology notes I consulted Dr. Aline Brochure orthopedics and Dr. Josephine Cables [facility] hospitalist and discussed lab and imaging findings and discussed disposition.  Cardiac monitoring reviewed, normal sinus rhythm Social determinants considered, no significant barriers Critical Interventions: None  After the interventions stated above, I reevaluated the patient and found patient to be rather tired although hemodynamically stable.  Blood pressure is up again although this is likely related to her acute painful condition from fractures Admission and further testing considered, she would benefit from mission to the hospital for further cardiac management working up syncope with no prodrome patient in agreement with plan.         Final Clinical Impression(s) / ED Diagnoses Final diagnoses:  Primary hypertension  Fall, initial encounter  Closed fracture of left elbow, initial encounter  Contusion of face, initial encounter  Epistaxis due to trauma  Eyebrow laceration, left, initial encounter  Closed fracture of facial bone due to fall, initial encounter ([place])  Syncope and collapse    Rx / DC  Orders ED  Discharge Orders     None         Hayden Rasmussen, MD 02/15/22 1026

## 2022-02-14 NOTE — ED Notes (Signed)
Pt walking with husband down hallway, had just signed discharge at nurses station, staff heard thump, pt had fallen, spouse bedside pt- pt had syncopal episode, denied dizziness or any warning or feeling bad. Pt alert and oriented sitting on floor, pt hit head with lac noted above left eye, pt left nare dripping blood- pressure and gauze applied to both- bleeding controlled, pt assisted up and onto stretcher, taken back to room 9 and connected to monitor, spouse at bedside, Dr Melina Copa at bedside.

## 2022-02-14 NOTE — Discharge Instructions (Signed)
You were seen in the emergency department for elevated blood pressure.  You had blood work and an EKG without any signs of damage from elevated blood pressure.  Please continue your regular medications and monitor your blood pressure.  Follow-up with your primary care doctor and your cardiology team for further medication adjustments.  Return to the emergency department if any worsening or concerning symptoms.

## 2022-02-15 ENCOUNTER — Observation Stay (HOSPITAL_COMMUNITY)
Admit: 2022-02-15 | Discharge: 2022-02-15 | Disposition: A | Discharge status: Home / Self Care | Payer: Medicare PPO | Attending: Medical | Admitting: Medical

## 2022-02-15 ENCOUNTER — Observation Stay (HOSPITAL_BASED_OUTPATIENT_CLINIC_OR_DEPARTMENT_OTHER): Payer: Medicare PPO

## 2022-02-15 ENCOUNTER — Encounter (HOSPITAL_COMMUNITY): Payer: Self-pay | Admitting: Internal Medicine

## 2022-02-15 DIAGNOSIS — W19XXXA Unspecified fall, initial encounter: Secondary | ICD-10-CM

## 2022-02-15 DIAGNOSIS — S02401A Maxillary fracture, unspecified, initial encounter for closed fracture: Secondary | ICD-10-CM

## 2022-02-15 DIAGNOSIS — Z9181 History of falling: Secondary | ICD-10-CM | POA: Diagnosis not present

## 2022-02-15 DIAGNOSIS — R040 Epistaxis: Secondary | ICD-10-CM | POA: Diagnosis not present

## 2022-02-15 DIAGNOSIS — S01112A Laceration without foreign body of left eyelid and periocular area, initial encounter: Secondary | ICD-10-CM | POA: Diagnosis not present

## 2022-02-15 DIAGNOSIS — R55 Syncope and collapse: Secondary | ICD-10-CM | POA: Diagnosis not present

## 2022-02-15 DIAGNOSIS — Z8679 Personal history of other diseases of the circulatory system: Secondary | ICD-10-CM | POA: Diagnosis not present

## 2022-02-15 DIAGNOSIS — I160 Hypertensive urgency: Secondary | ICD-10-CM | POA: Diagnosis not present

## 2022-02-15 DIAGNOSIS — Z7901 Long term (current) use of anticoagulants: Secondary | ICD-10-CM | POA: Diagnosis not present

## 2022-02-15 DIAGNOSIS — S52022A Displaced fracture of olecranon process without intraarticular extension of left ulna, initial encounter for closed fracture: Secondary | ICD-10-CM | POA: Diagnosis not present

## 2022-02-15 DIAGNOSIS — E039 Hypothyroidism, unspecified: Secondary | ICD-10-CM

## 2022-02-15 DIAGNOSIS — W1830XA Fall on same level, unspecified, initial encounter: Secondary | ICD-10-CM | POA: Diagnosis not present

## 2022-02-15 DIAGNOSIS — S0232XA Fracture of orbital floor, left side, initial encounter for closed fracture: Secondary | ICD-10-CM

## 2022-02-15 DIAGNOSIS — I10 Essential (primary) hypertension: Secondary | ICD-10-CM

## 2022-02-15 DIAGNOSIS — S0292XA Unspecified fracture of facial bones, initial encounter for closed fracture: Secondary | ICD-10-CM | POA: Diagnosis not present

## 2022-02-15 DIAGNOSIS — S0083XA Contusion of other part of head, initial encounter: Secondary | ICD-10-CM

## 2022-02-15 DIAGNOSIS — Z79899 Other long term (current) drug therapy: Secondary | ICD-10-CM | POA: Diagnosis not present

## 2022-02-15 DIAGNOSIS — S42402A Unspecified fracture of lower end of left humerus, initial encounter for closed fracture: Secondary | ICD-10-CM | POA: Diagnosis not present

## 2022-02-15 LAB — COMPREHENSIVE METABOLIC PANEL
ALT: 14 U/L (ref 0–44)
AST: 18 U/L (ref 15–41)
Albumin: 3.7 g/dL (ref 3.5–5.0)
Alkaline Phosphatase: 52 U/L (ref 38–126)
Anion gap: 12 (ref 5–15)
BUN: 24 mg/dL — ABNORMAL HIGH (ref 8–23)
CO2: 20 mmol/L — ABNORMAL LOW (ref 22–32)
Calcium: 8.9 mg/dL (ref 8.9–10.3)
Chloride: 105 mmol/L (ref 98–111)
Creatinine, Ser: 1.27 mg/dL — ABNORMAL HIGH (ref 0.44–1.00)
GFR, Estimated: 42 mL/min — ABNORMAL LOW (ref >60)
Glucose, Bld: 140 mg/dL — ABNORMAL HIGH (ref 70–99)
Potassium: 3.4 mmol/L — ABNORMAL LOW (ref 3.5–5.1)
Sodium: 137 mmol/L (ref 135–145)
Total Bilirubin: 0.9 mg/dL (ref 0.3–1.2)
Total Protein: 6.5 g/dL (ref 6.5–8.1)

## 2022-02-15 LAB — CBC
HCT: 29.5 % — ABNORMAL LOW (ref 36.0–46.0)
Hemoglobin: 9.6 g/dL — ABNORMAL LOW (ref 12.0–15.0)
MCH: 33 pg (ref 26.0–34.0)
MCHC: 32.5 g/dL (ref 30.0–36.0)
MCV: 101.4 fL — ABNORMAL HIGH (ref 80.0–100.0)
Platelets: 227 10*3/uL (ref 150–400)
RBC: 2.91 MIL/uL — ABNORMAL LOW (ref 3.87–5.11)
RDW: 13 % (ref 11.5–15.5)
WBC: 11.5 10*3/uL — ABNORMAL HIGH (ref 4.0–10.5)
nRBC: 0 % (ref 0.0–0.2)

## 2022-02-15 LAB — MAGNESIUM: Magnesium: 1.6 mg/dL — ABNORMAL LOW (ref 1.7–2.4)

## 2022-02-15 LAB — TSH: TSH: 2.962 u[IU]/mL (ref 0.350–4.500)

## 2022-02-15 LAB — ECHOCARDIOGRAM COMPLETE
Area-P 1/2: 2.66 cm2
Height: 67 in
S' Lateral: 2.6 cm
Weight: 1989.43 oz

## 2022-02-15 LAB — PHOSPHORUS: Phosphorus: 2.8 mg/dL (ref 2.5–4.6)

## 2022-02-15 MED ORDER — CLONIDINE HCL 0.1 MG PO TABS
0.1000 mg | ORAL_TABLET | Freq: Three times a day (TID) | ORAL | Status: DC
  Administered 2022-02-15 – 2022-02-16 (×4): 0.1 mg via ORAL
  Filled 2022-02-15 (×4): qty 1

## 2022-02-15 MED ORDER — ATENOLOL 25 MG PO TABS
50.0000 mg | ORAL_TABLET | Freq: Every day | ORAL | Status: DC
  Administered 2022-02-15 – 2022-02-16 (×2): 50 mg via ORAL
  Filled 2022-02-15 (×2): qty 2

## 2022-02-15 MED ORDER — FLECAINIDE ACETATE 50 MG PO TABS
50.0000 mg | ORAL_TABLET | Freq: Two times a day (BID) | ORAL | Status: DC
  Administered 2022-02-15 – 2022-02-16 (×3): 50 mg via ORAL
  Filled 2022-02-15 (×3): qty 1

## 2022-02-15 MED ORDER — HYDROMORPHONE HCL 1 MG/ML IJ SOLN
0.5000 mg | Freq: Once | INTRAMUSCULAR | Status: AC | PRN
  Administered 2022-02-15: 0.5 mg via INTRAVENOUS
  Filled 2022-02-15: qty 0.5

## 2022-02-15 MED ORDER — ACETAMINOPHEN 650 MG RE SUPP: 650.0000 mg | Freq: Four times a day (QID) | RECTAL | Status: DC | PRN

## 2022-02-15 MED ORDER — LEVOTHYROXINE SODIUM 50 MCG PO TABS: 50.0000 ug | ORAL_TABLET | Freq: Every day | ORAL | Status: DC

## 2022-02-15 MED ORDER — NICARDIPINE HCL IN NACL 20-0.86 MG/200ML-% IV SOLN
3.0000 mg/h | INTRAVENOUS | Status: DC
  Administered 2022-02-15: 5 mg/h via INTRAVENOUS
  Filled 2022-02-15: qty 200

## 2022-02-15 MED ORDER — ONDANSETRON HCL 4 MG/2ML IJ SOLN
4.0000 mg | Freq: Four times a day (QID) | INTRAMUSCULAR | Status: DC | PRN
  Filled 2022-02-15: qty 2

## 2022-02-15 MED ORDER — LEVOTHYROXINE SODIUM 50 MCG PO TABS
50.0000 ug | ORAL_TABLET | Freq: Every day | ORAL | Status: DC
  Administered 2022-02-15 – 2022-02-16 (×2): 50 ug via ORAL
  Filled 2022-02-15 (×2): qty 1

## 2022-02-15 MED ORDER — AMLODIPINE BESYLATE 5 MG PO TABS
5.0000 mg | ORAL_TABLET | Freq: Every day | ORAL | Status: DC
  Administered 2022-02-15 – 2022-02-16 (×2): 5 mg via ORAL
  Filled 2022-02-15 (×2): qty 1

## 2022-02-15 MED ORDER — LIDOCAINE-EPINEPHRINE (PF) 2 %-1:200000 IJ SOLN
10.0000 mL | Freq: Once | INTRAMUSCULAR | Status: AC
  Administered 2022-02-15: 10 mL
  Filled 2022-02-15: qty 20

## 2022-02-15 MED ORDER — POTASSIUM CHLORIDE CRYS ER 20 MEQ PO TBCR
20.0000 meq | EXTENDED_RELEASE_TABLET | Freq: Once | ORAL | Status: AC
  Administered 2022-02-15: 20 meq via ORAL
  Filled 2022-02-15: qty 1

## 2022-02-15 MED ORDER — HYDRALAZINE HCL 25 MG PO TABS
100.0000 mg | ORAL_TABLET | Freq: Three times a day (TID) | ORAL | Status: DC
  Administered 2022-02-15 – 2022-02-16 (×3): 100 mg via ORAL
  Filled 2022-02-15 (×3): qty 4

## 2022-02-15 MED ORDER — ONDANSETRON HCL 4 MG PO TABS: 4.0000 mg | ORAL_TABLET | Freq: Four times a day (QID) | ORAL | Status: DC | PRN

## 2022-02-15 MED ORDER — MELATONIN 5 MG PO TABS
5.0000 mg | ORAL_TABLET | Freq: Every day | ORAL | Status: DC
  Filled 2022-02-15: qty 1

## 2022-02-15 MED ORDER — RIVAROXABAN 15 MG PO TABS
15.0000 mg | ORAL_TABLET | Freq: Every day | ORAL | Status: DC
  Filled 2022-02-15: qty 1

## 2022-02-15 MED ORDER — MELATONIN 3 MG PO TABS
6.0000 mg | ORAL_TABLET | Freq: Every day | ORAL | Status: DC
  Administered 2022-02-15 (×2): 6 mg via ORAL
  Filled 2022-02-15 (×2): qty 2

## 2022-02-15 MED ORDER — ACETAMINOPHEN 325 MG PO TABS
650.0000 mg | ORAL_TABLET | Freq: Four times a day (QID) | ORAL | Status: DC | PRN
  Administered 2022-02-15 (×3): 650 mg via ORAL
  Filled 2022-02-15 (×3): qty 2

## 2022-02-15 NOTE — Plan of Care (Signed)
  Problem: Acute Rehab PT Goals(only PT should resolve) Goal: Pt Will Go Supine/Side To Sit Outcome: Progressing Flowsheets (Taken 02/15/2022 1223) Pt will go Supine/Side to Sit: Independently Goal: Patient Will Transfer Sit To/From Stand Outcome: Progressing Flowsheets (Taken 02/15/2022 1223) Patient will transfer sit to/from stand: Independently Goal: Pt Will Transfer Bed To Chair/Chair To Bed Outcome: Progressing Flowsheets (Taken 02/15/2022 1223) Pt will Transfer Bed to Chair/Chair to Bed:  Independently  with modified independence Goal: Pt Will Ambulate Outcome: Progressing Flowsheets (Taken 02/15/2022 1223) Pt will Ambulate:  100 feet  with modified independence  Independently  with least restrictive assistive device   12:24 PM, 02/15/22 Lonell Grandchild, MPT Physical Therapist with [facility] [phone] office [phone] mobile phone

## 2022-02-15 NOTE — Progress Notes (Signed)
*  PRELIMINARY RESULTS* Echocardiogram 2D Echocardiogram has been performed.  Amy [name] 02/15/2022, 1:07 PM

## 2022-02-15 NOTE — Care Management Obs Status (Signed)
[place] NOTIFICATION   Patient Details  Name: Amy [name] MRN: [id number] Date of Birth: 1935/09/03   Medicare Observation Status Notification Given:  Yes    Tommy Medal 02/15/2022, 3:14 PM

## 2022-02-15 NOTE — Consult Note (Addendum)
Cardiology Consultation   Patient ID: Amy [name] MRN: [id number]; DOB: Aug 16, 1935  Admit date: 02/14/2022 Date of Consult: 02/15/2022  PCP:  Binnie Rail, MD   [facility] Cardiologist:  Dorris Carnes, MD   {  Patient Profile:   Amy [name] is an 87 y.o. female with a hx of difficult to control hypertension, hypothyroidism, CKD, PAF status post ablation, and renal artery stenosis status post RRA stent who is being seen 02/15/2022 for the evaluation of syncope at the request of Dr. Carles Collet.  History of Present Illness:   Amy [name] presented to the ER at [facility] on 02/14/22 due to elevated blood pressures.  She checked it around 5 PM and reported systolic greater than 846.  She took a dose of clonidine as recommended by her PCP.  Blood pressure remained elevated and she went to the ER. Patient denied any symptoms.  The ER blood pressure was 185/70 on arrival.  Other vital signs in range.  Creatinine 1.49, BUN 25.  Troponin negative. The patient was given clonidine 0.5 mg and IV normal saline 1 L.  Patient was being discharged home from the ED, while walking out with her husband she suddenly fell face down sustaining fractures as described.  CT of the head nonacute.  CT of the spine/face showed left maxillary sinus fracture, mildly displaced fracture of the left orbital floor.  Left elbow x-ray showed acute comminuted intra-articular olecranon fracture.  Orthopedic surgery was consulted and recommended splinting and follow-up of the left olecranon fracture per ED.  Patient was admitted for further workup. On interview, the patient reports when she fell she had no pro-dromal symptoms.   Prior cardiovascular work-up is as follows:  She had normal coronaries by cath in 2003.  Cardiac CTA in 2007 demonstrated normal coronary arteries. Renal artery ultrasound in December 2022 revealed probable occlusion of the right renal artery, angiogram was recommended.  She underwent  PTA/stent on 02/13/2021.  Follow-up renal artery ultrasound in February 23 showed no evidence of right renal stenosis and no evidence of left renal stenosis.  Repeat renal artery ultrasound November 20, 2021 showed greater than 60% stenosis right renal artery, no left renal artery stenosis.  Patient was last seen in the office 01/24/2022 reporting bilateral ankle swelling and redness for 1 month.  She reported SBP at home 120 to 150 mmHg.  Amlodipine was decreased from 10 to 5 mg daily.  Losartan was discontinued and olmesartan 40 mg daily was started.  Patient is on flecainide for A-fib.  She takes Xarelto 15 mg daily (creatinine clearance dose).   Past Medical History:  Diagnosis Date   Anemia    Arthritis    Atrial fibrillation (HCC)    Atrial flutter ([place])    s/p ablation   Atypical mole 09/07/2013   LOWER LEG SEVERE TX= WIDER SHAVE    HTN (hypertension)    Hypothyroidism    Nodular basal cell carcinoma (BCC) 03/29/2020   Right Malar Cheek   Renal artery stenosis [facility])     Past Surgical History:  Procedure Laterality Date   A FLUTTER ABLATION     KNEE ARTHROSCOPY Right    PERIPHERAL VASCULAR INTERVENTION Right 02/13/2021   Procedure: PERIPHERAL VASCULAR INTERVENTION;  Surgeon: Serafina Mitchell, MD;  Location: [place] [facility];  Service: Cardiovascular;  Laterality: Right;   RENAL ANGIOGRAPHY N/A 02/13/2021   Procedure: RENAL ANGIOGRAPHY;  Surgeon: Serafina Mitchell, MD;  Location: [place] [facility];  Service: Cardiovascular;  Laterality: N/A;   TUBAL LIGATION       Home Medications:  Prior to Admission medications   Medication Sig Start Date End Date Taking? Authorizing Provider  acetaminophen (TYLENOL) 650 MG CR tablet Take 1,300 mg by mouth 2 (two) times daily as needed for pain.    [provider]  AMBULATORY NON FORMULARY MEDICATION Take 1 drop by mouth daily as needed (pain). CBD Oil 1 dose under tongue once daily as needed for pain    [provider]   amLODipine (NORVASC) 5 MG tablet Take 1 tablet (5 mg total) by mouth daily. 01/24/22   Swinyer, Lanice Schwab, NP  amoxicillin (AMOXIL) 500 MG capsule Take 500 mg by mouth 3 (three) times daily as needed (For gum flare up).    [provider]  atenolol (TENORMIN) 50 MG tablet Take 1 tablet (50 mg total) by mouth daily. 01/04/22   Fay Records, MD  Calcium Carbonate-Vitamin D (CALCIUM + D PO) Take 1 tablet by mouth daily.    [provider]  Cholecalciferol 100 MCG (4000 UT) CAPS Take 1 capsule (4,000 Units total) by mouth daily. 01/27/18   Fay Records, MD  cloNIDine (CATAPRES) 0.1 MG tablet Take 1 tablet (0.1 mg total) by mouth 3 (three) times daily. 02/04/22   Fay Records, MD  dorzolamide-timolol (COSOPT) 22.3-6.8 MG/ML ophthalmic solution Place 1 drop into both eyes 2 (two) times daily. 06/22/19   [provider]  flecainide (TAMBOCOR) 50 MG tablet TAKE 1 TABLET BY MOUTH TWICE A DAY 05/10/21   Fay Records, MD  hydrALAZINE (APRESOLINE) 100 MG tablet TAKE 1 TABLET BY MOUTH 3 TIMES DAILY. 12/18/21   Fay Records, MD  levothyroxine (SYNTHROID) 50 MCG tablet TAKE 1 TABLET BY MOUTH EVERY DAY 05/10/21   Fay Records, MD  linaclotide [facility]) 72 MCG capsule Take 72 mcg by mouth as needed (for constipation).    [provider]  olmesartan (BENICAR) 40 MG tablet Take 1 tablet (40 mg total) by mouth daily. 01/24/22   Swinyer, Lanice Schwab, NP  prednisoLONE acetate (PRED FORTE) 1 % ophthalmic suspension Place 1 drop into the left eye every 4 (four) hours. 01/23/22   [provider]  traZODone (DESYREL) 50 MG tablet Take 50 mg by mouth at bedtime as needed for sleep. 12/18/21   [provider]  XARELTO 15 MG TABS tablet TAKE 1 TABLET BY MOUTH EVERY DAY WITH SUPPER 01/28/22   Fay Records, MD    Inpatient Medications: Scheduled Meds:  amLODipine  5 mg Oral Daily   atenolol  50 mg Oral Daily   cloNIDine  0.1 mg Oral TID   flecainide  50 mg Oral Q12H    hydrALAZINE  100 mg Oral Q8H   levothyroxine  50 mcg Oral Q0600   melatonin  6 mg Oral QHS   potassium chloride  20 mEq Oral Once   Rivaroxaban  15 mg Oral Daily    PRN Meds: acetaminophen **OR** acetaminophen, ondansetron **OR** ondansetron (ZOFRAN) IV  Allergies:   No Known Allergies  Social History:   Social History   Socioeconomic History   Marital status: Married    Spouse name: Not on file   Number of children: 1   Years of education: Not on file   Highest education level: Not on file  Occupational History   Occupation: Retired  Tobacco Use   Smoking status: Never    Passive exposure: Never   Smokeless  tobacco: Never  Vaping Use   Vaping Use: Never used  Substance and Sexual Activity   Alcohol use: No   Drug use: No   Sexual activity: Not on file  Other Topics Concern   Not on file  Social History Narrative   Occ caffeine    Social Determinants of Health   Financial Resource Strain: Low Risk  (06/26/2021)   Overall Financial Resource Strain (CARDIA)    Difficulty of Paying Living Expenses: Not hard at all  Food Insecurity: No Food Insecurity (06/26/2021)   Hunger Vital Sign    Worried About Running Out of Food in the Last Year: Never true    Ran Out of Food in the Last Year: Never true  Transportation Needs: No Transportation Needs (06/26/2021)   PRAPARE - Hydrologist (Medical): No    Lack of Transportation (Non-Medical): No  Physical Activity: Sufficiently Active (06/26/2021)   Exercise Vital Sign    Days of Exercise per Week: 5 days    Minutes of Exercise per Session: 30 min  Stress: No Stress Concern Present (06/26/2021)   [place]    Feeling of Stress : Not at all  Social Connections: [place] (06/26/2021)   Social Connection and Isolation Panel [NHANES]    Frequency of Communication with Friends and Family: More than three times a week    Frequency of  Social Gatherings with Friends and Family: More than three times a week    Attends Religious Services: More than 4 times per year    Active Member of [employer] or Organizations: Yes    Attends Music therapist: More than 4 times per year    Marital Status: Married  Human resources officer Violence: Not At Risk (06/26/2021)   Humiliation, Afraid, Rape, and Kick questionnaire    Fear of Current or Ex-Partner: No    Emotionally Abused: No    Physically Abused: No    Sexually Abused: No    Family History:    Family History  Problem Relation Age of Onset   Stroke Father 65       died from stroke at age 31   Stroke Mother 66       died from stroke at age 11   Heart disease Mother    Colon cancer Brother      ROS:  Please see the history of present illness.  All other ROS reviewed and negative.     Physical Exam/Data:   Vitals:   02/15/22 0700 02/15/22 0719 02/15/22 0811 02/15/22 0830  BP: (!) 142/58 (!) 148/54 (!) 145/62 124/76  Pulse: 62 66 62 66  Resp: '12 18 16 '$ (!) 23  Temp:      TempSrc:      SpO2: 91% 94% 97% 98%  Weight:      Height:        Intake/Output Summary (Last 24 hours) at 02/15/2022 0904 Last data filed at 02/15/2022 9417 Gross per 24 hour  Intake 1137.15 ml  Output --  Net 1137.15 ml      02/14/2022    7:15 PM 01/24/2022    1:51 PM 12/31/2021   11:23 AM  Last 3 Weights  Weight (lbs) 125 lb 125 lb 123 lb  Weight (kg) 56.7 kg 56.7 kg 55.792 kg     Body mass index is 19.58 kg/m.  General:  No acute distress HEENT: Periorbital and frontal ecchymosis/hematoma Neck: no JVD Vascular: No  carotid bruits; Distal pulses 2+ bilaterally Cardiac:  normal S1, S2; RRR; 1/6 systolic murmur  Lungs:  clear to auscultation bilaterally, no wheezing, rhonchi or rales  Abd: soft, nontender, no hepatomegaly  Ext: no edema Musculoskeletal:  No deformities, BUE and BLE strength normal and equal Skin: warm and dry  Neuro:  CNs 2-12 intact, no focal abnormalities  noted Psych:  Normal affect   EKG:  The EKG was personally reviewed and demonstrates:  Nsr 70bpm, RBBB, LPFB, nonspecific ST changes Telemetry:  Telemetry was personally reviewed and demonstrates:  NSR HR 60s  Relevant CV Studies:  Renal US 10/2021 Summary:  Renal:    Right: Evidence of a > 60% stenosis of the right renal artery. RRV         flow present. Abnormal size for the right kidney. Abnormal         right Resistive Index. Normal cortical thickness of right         kidney.  Left:  No evidence of left renal artery stenosis. LRV flow present.         Abnormal size for the left kidney. Abnormal left Resisitve         Index. Normal cortical thickness of the left kidney.  Mesenteric:  Normal Celiac artery findings. 70 to 99% stenosis in the superior  mesenteric  artery. Areas of limited visceral study include right kidney size, left  kidney  size and right parenchymal flow.   Laboratory Data:  High Sensitivity Troponin:   Recent Labs  Lab 02/14/22 1946  TROPONINIHS 9     Chemistry Recent Labs  Lab 02/14/22 1946 02/15/22 0329  NA 136 137  K 4.2 3.4*  CL 102 105  CO2 23 20*  GLUCOSE 103* 140*  BUN 25* 24*  CREATININE 1.49* 1.27*  CALCIUM 9.8 8.9  MG  --  1.6*  GFRNONAA 34* 42*  ANIONGAP 11 12    Recent Labs  Lab 02/15/22 0329  PROT 6.5  ALBUMIN 3.7  AST 18  ALT 14  ALKPHOS 52  BILITOT 0.9   Recent Labs  Lab 02/14/22 1946 02/15/22 0329  WBC 5.7 11.5*  RBC 3.27* 2.91*  HGB 10.6* 9.6*  HCT 32.9* 29.5*  MCV 100.6* 101.4*  MCH 32.4 33.0  MCHC 32.2 32.5  RDW 13.2 13.0  PLT 224 227    Radiology/Studies:  CT Head Wo Contrast  Result Date: 02/14/2022 CLINICAL DATA:  Facial trauma. EXAM: CT HEAD WITHOUT CONTRAST CT MAXILLOFACIAL WITHOUT CONTRAST CT CERVICAL SPINE WITHOUT CONTRAST TECHNIQUE: Multidetector CT imaging of the head, cervical spine, and maxillofacial structures were performed using the standard protocol without intravenous contrast.  Multiplanar CT image reconstructions of the cervical spine and maxillofacial structures were also generated. RADIATION DOSE REDUCTION: This exam was performed according to the departmental dose-optimization program which includes automated exposure control, adjustment of the mA and/or kV according to patient size and/or use of iterative reconstruction technique. COMPARISON:  None Available. FINDINGS: CT HEAD FINDINGS Brain: The ventricles and sulci appropriate size for patient's age. The gray-white matter discrimination is preserved. There is no acute intracranial hemorrhage. No mass effect or midline shift. No extra-axial fluid collection. Vascular: No hyperdense vessel or unexpected calcification. Skull: Normal. Negative for fracture or focal lesion. Other: None CT MAXILLOFACIAL FINDINGS Osseous: Nondisplaced fracture of the anterior and posterolateral walls of the left maxillary sinus. There is mildly displaced fracture of the left orbital floor. No mandibular dislocation. Orbits: The globes and retro-orbital fat are preserved. Sinuses: There is  blood product within the left maxillary sinus and several ethmoid air cells. Soft tissues: Mild left periorbital hematoma. CT CERVICAL SPINE FINDINGS Alignment: No acute subluxation. There is reversal of normal cervical lordosis which may be positional or due to muscle spasm. There is grade 1 C3-C4 anterolisthesis. Skull base and vertebrae: No acute fracture.  Osteopenia. Soft tissues and spinal canal: No prevertebral fluid or swelling. No visible canal hematoma. Disc levels: No acute findings. Multilevel degenerative changes and facet arthropathy. Upper chest: Negative. Other: Bilateral carotid bulb calcified plaques. IMPRESSION: 1. No acute intracranial pathology. 2. No acute/traumatic cervical spine pathology. 3. Nondisplaced fracture of the anterior and posterolateral walls of the left maxillary sinus. Mildly displaced fracture of the left orbital floor.  Electronically Signed   By: Anner Crete M.D.   On: 02/14/2022 23:26   CT Maxillofacial WO CM  Result Date: 02/14/2022 CLINICAL DATA:  Facial trauma. EXAM: CT HEAD WITHOUT CONTRAST CT MAXILLOFACIAL WITHOUT CONTRAST CT CERVICAL SPINE WITHOUT CONTRAST TECHNIQUE: Multidetector CT imaging of the head, cervical spine, and maxillofacial structures were performed using the standard protocol without intravenous contrast. Multiplanar CT image reconstructions of the cervical spine and maxillofacial structures were also generated. RADIATION DOSE REDUCTION: This exam was performed according to the departmental dose-optimization program which includes automated exposure control, adjustment of the mA and/or kV according to patient size and/or use of iterative reconstruction technique. COMPARISON:  None Available. FINDINGS: CT HEAD FINDINGS Brain: The ventricles and sulci appropriate size for patient's age. The gray-white matter discrimination is preserved. There is no acute intracranial hemorrhage. No mass effect or midline shift. No extra-axial fluid collection. Vascular: No hyperdense vessel or unexpected calcification. Skull: Normal. Negative for fracture or focal lesion. Other: None CT MAXILLOFACIAL FINDINGS Osseous: Nondisplaced fracture of the anterior and posterolateral walls of the left maxillary sinus. There is mildly displaced fracture of the left orbital floor. No mandibular dislocation. Orbits: The globes and retro-orbital fat are preserved. Sinuses: There is blood product within the left maxillary sinus and several ethmoid air cells. Soft tissues: Mild left periorbital hematoma. CT CERVICAL SPINE FINDINGS Alignment: No acute subluxation. There is reversal of normal cervical lordosis which may be positional or due to muscle spasm. There is grade 1 C3-C4 anterolisthesis. Skull base and vertebrae: No acute fracture.  Osteopenia. Soft tissues and spinal canal: No prevertebral fluid or swelling. No visible canal  hematoma. Disc levels: No acute findings. Multilevel degenerative changes and facet arthropathy. Upper chest: Negative. Other: Bilateral carotid bulb calcified plaques. IMPRESSION: 1. No acute intracranial pathology. 2. No acute/traumatic cervical spine pathology. 3. Nondisplaced fracture of the anterior and posterolateral walls of the left maxillary sinus. Mildly displaced fracture of the left orbital floor. Electronically Signed   By: Anner Crete M.D.   On: 02/14/2022 23:26   CT Cervical Spine Wo Contrast  Result Date: 02/14/2022 CLINICAL DATA:  Facial trauma. EXAM: CT HEAD WITHOUT CONTRAST CT MAXILLOFACIAL WITHOUT CONTRAST CT CERVICAL SPINE WITHOUT CONTRAST TECHNIQUE: Multidetector CT imaging of the head, cervical spine, and maxillofacial structures were performed using the standard protocol without intravenous contrast. Multiplanar CT image reconstructions of the cervical spine and maxillofacial structures were also generated. RADIATION DOSE REDUCTION: This exam was performed according to the departmental dose-optimization program which includes automated exposure control, adjustment of the mA and/or kV according to patient size and/or use of iterative reconstruction technique. COMPARISON:  None Available. FINDINGS: CT HEAD FINDINGS Brain: The ventricles and sulci appropriate size for patient's age. The gray-white matter discrimination is preserved. There  is no acute intracranial hemorrhage. No mass effect or midline shift. No extra-axial fluid collection. Vascular: No hyperdense vessel or unexpected calcification. Skull: Normal. Negative for fracture or focal lesion. Other: None CT MAXILLOFACIAL FINDINGS Osseous: Nondisplaced fracture of the anterior and posterolateral walls of the left maxillary sinus. There is mildly displaced fracture of the left orbital floor. No mandibular dislocation. Orbits: The globes and retro-orbital fat are preserved. Sinuses: There is blood product within the left maxillary  sinus and several ethmoid air cells. Soft tissues: Mild left periorbital hematoma. CT CERVICAL SPINE FINDINGS Alignment: No acute subluxation. There is reversal of normal cervical lordosis which may be positional or due to muscle spasm. There is grade 1 C3-C4 anterolisthesis. Skull base and vertebrae: No acute fracture.  Osteopenia. Soft tissues and spinal canal: No prevertebral fluid or swelling. No visible canal hematoma. Disc levels: No acute findings. Multilevel degenerative changes and facet arthropathy. Upper chest: Negative. Other: Bilateral carotid bulb calcified plaques. IMPRESSION: 1. No acute intracranial pathology. 2. No acute/traumatic cervical spine pathology. 3. Nondisplaced fracture of the anterior and posterolateral walls of the left maxillary sinus. Mildly displaced fracture of the left orbital floor. Electronically Signed   By: Anner Crete M.D.   On: 02/14/2022 23:26   DG Elbow Complete Left  Result Date: 02/14/2022 CLINICAL DATA:  Fall, left elbow pain EXAM: LEFT ELBOW - COMPLETE 3+ VIEW COMPARISON:  None Available. FINDINGS: There is an acute comminuted intra-articular fracture of the olecranonl the proximally 3 mm distraction of the major fracture fragments. No significant articular incongruity. Normal alignment. Large left elbow effusion. Mild soft tissue swelling superficial to the olecranon. IMPRESSION: 1. Acute comminuted intra-articular olecranon fracture. Electronically Signed   By: Fidela Salisbury M.D.   On: 02/14/2022 23:22     Assessment and Plan:   Hypertensive urgency -Patient has known difficult to control blood pressure with a history of renal artery stenosis status post stenting on the right side -The patient was recently seen in the OP setting and amlodipine was decreased to 5 mg (for LLE) and losartan was changed to olmesartan 40 mg daily - the patient presented with systolics greater than 073X given clonidine with improvement. -Troponin levels normal -Now  being admitted for observation in light of fall and apparent syncope -Patient was started on nicardipine drip with improvement of blood pressure -PTA amlodipine 5 mg daily, atenolol 50 mg daily, clonidine point 1 mg 3 times daily, hydralazine 100 mg 3 times daily, olmesartan 40 mg daily -Most recent blood pressure was normal -Agree with stopping IV drip and adding back home meds.  Monitor blood pressure and Imdur as another option if blood pressure remains elevated  Fall/syncope -Patient fell while she was leaving the ER and sustained closed fracture of the left olecranon process, fracture of the left maxillary sinus, mildly displaced fracture of left orbital floor orthopedic surgery was consulted.  -Patient reports no prodromal symptoms prior to falling -Telemetry before and after unremarkable -Check echocardiogram -Check orthostatics -TSH -Patient may need heart monitor at discharge  Renal artery stenosis status post stent January 2023 -Renal artery duplex in October 2023 showed greater than 60% stenosis of the right renal artery, no evidence of left renal artery stenosis.  Right renal artery stent patent. -Patient is followed by vascular surgery.  Follow-up continue as outpatient  Paroxysmal A-fib on Xarelto -Status post ablation on flecainide 50 mg twice a day -Patient remains in sinus rhythm -May hold anticoagulation temporarily -Of note hemoglobin has gone from 12-9.6 over  the last few days.  Continue with daily CBC   For questions or updates, please contact [place] Please consult [email or web address] for contact info under    Signed, Cadence Jorene [name] 02/15/2022 9:04 AM     Attending note:  Patient seen and examined.  I reviewed her records and discussed the case with Amy [name], I agree with her above findings.  Amy [name] presents with cardiovascular history as documented above in the HPI for evaluation of uncontrolled blood pressure.  She has a history of  difficult to control hypertension as well as right renal artery stenosis status post stent intervention with some degree of right renal artery restenosis by recent noninvasive imaging.  She is on multimodal antihypertensive therapy which was recently adjusted in the office as discussed above as well.  Was treated in the ER with general improvement in blood pressure and plan to discharge home for outpatient follow-up.  While walking out of the ER with her husband she suddenly fell to the floor, apparent syncopal episode with facial and left olecranon fractures subsequently documented and now plan for admission and further evaluation.  On my interview with her this morning, she states that she had no prodromal symptoms whatsoever while she was walking out, did not feel dizzy, had no palpitations or chest pain.  At baseline she reports no history of orthostatic dizziness or syncope.  As far as she can tell, she has not had any atrial arrhythmias for quite some time.  She reports compliance with her medications, no obvious history of orthostasis documented.  No history of heart block.  She has not had a recent echocardiogram.  Initial high-sensitivity troponin I level normal in the ER and she does not report any chest pain with activity.  On examination she reports no specific symptoms.  Recent systolics 817R to 116F on nicardipine infusion, heart rate in the 60s in sinus rhythm by telemetry which I personally reviewed.  Left facial, periorbital and frontal ecchymosis/hematoma noted.  Left arm in sling.  Lungs are clear.  Cardiac exam with RRR and soft systolic murmur, no gallop.  She is alert and oriented.  Pertinent lab work includes potassium 3.4, BUN 24, creatinine 1.27, normal LFTs, high-sensitivity troponin I 9, WBC 11.5, hemoglobin 9.6 from 10.6, platelets 227.  I personally reviewed her ECG which shows sinus rhythm with right bundle branch block and left posterior fascicular block.  Patient being  admitted to the hospitalist team for further workup.  Would continue telemetry, check orthostatics, and follow-up echocardiogram.  Suggest converting from nicardipine infusion back to prior oral regimen and then adjust therapy from there.  Repeat ECG in a.m.  If no obvious etiology uncovered from a cardiac perspective, would consider outpatient ZIO monitor.  She does have evidence of conduction system disease by ECG, it would be important to exclude symptomatic pauses or heart block in the workup as well.  Satira Sark, M.D., F.A.C.C.

## 2022-02-15 NOTE — ED Notes (Signed)
Dr Melina Copa at bedside for suture repair

## 2022-02-15 NOTE — H&P (Addendum)
History and Physical    Patient: Amy [name] WCB:[id number] DOB: 13-Mar-1935 DOA: 02/14/2022 DOS: the patient was seen and examined on 02/15/2022 PCP: Binnie Rail, MD  Patient coming from: Home  Chief Complaint:  Chief Complaint  Patient presents with   Hypertension   Loss of Consciousness   HPI: Amy [name] is a 87 y.o. female with medical history significant of hypertension, hypothyroidism, A-Fib/A-flutter s/p ablation who presents to the emergency department accompanied by husband due to elevated blood pressure.  Patient has a history of difficult to control hypertension, she checked her BP this evening around 5:30 PM and it was greater than 616 systolic, she took a dose of clonidine as recommended by her PCP.  BP continued to stay elevated, though she had no symptoms.  It was noted that patient's losartan was recently changed to olmesartan and amlodipine was being weaned down by cardiologist due to ankle swelling within the last 3 weeks.  She denies chest pain, shortness of breath, fever, chills, headache, blurry vision.  ED Course:  In the emergency department, BP was 185/70 on arrival and other vital signs are within normal range.  Workup in the ED showed macrocytic anemia, BMP was normal except for blood glucose of 103 and BUN/creatinine 25/1.49 (creatinine is within baseline range).  Troponin x 1 was negative. CT head without contrast showed no acute intracranial pathology CT cervical spine without contrast showed no acute/traumatic cervical spine pathology CT maxillofacial without contrast showed nondisplaced  fracture of the anterior and posterolateral walls of the left maxillary sinus. Mildly displaced fracture of the left orbital floor. Left elbow x-ray showed acute comminuted intra-articular olecranon fracture Patient was treated with clonidine 0.4 mg and IV NS 1 L was given.  Patient was discharged home from the ED, unfortunately, while leaving the ED, she fell face down  sustaining fractures as described above.  Suture of laceration of left eyebrow was provided.  Orthopedic surgeon was consulted and recommended splinting and follow-up of the left olecranon fracture per EDP.  Hospitalist was asked to admit patient for further evaluation and management.  Review of Systems: Review of systems as noted in the HPI. All other systems reviewed and are negative.   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 She has never been exposed to tobacco smoke. She has never used smokeless tobacco. She reports that she does not drink alcohol and does not use drugs.   No Known Allergies  Family History  Problem Relation Age of Onset   Stroke Father 73  died from stroke at age 71   Stroke Mother 82       died from stroke at age 64   Heart disease Mother    Colon cancer Brother      Prior to Admission medications   Medication Sig Start Date End Date Taking? Authorizing Provider  acetaminophen (TYLENOL) 650 MG CR tablet Take 1,300 mg by mouth 2 (two) times daily as needed for pain.    [provider]  AMBULATORY NON FORMULARY  MEDICATION Take 1 drop by mouth daily as needed (pain). CBD Oil 1 dose under tongue once daily as needed for pain    [provider]  amLODipine (NORVASC) 5 MG tablet Take 1 tablet (5 mg total) by mouth daily. 01/24/22   Swinyer, Lanice Schwab, NP  amoxicillin (AMOXIL) 500 MG capsule Take 500 mg by mouth 3 (three) times daily as needed (For gum flare up).    [provider]  atenolol (TENORMIN) 50 MG tablet Take 1 tablet (50 mg total) by mouth daily. 01/04/22   Fay Records, MD  Calcium Carbonate-Vitamin D (CALCIUM + D PO) Take 1 tablet by mouth daily.    [provider]  Cholecalciferol 100 MCG (4000 UT) CAPS Take 1 capsule (4,000 Units total) by mouth daily. 01/27/18   Fay Records, MD  cloNIDine (CATAPRES) 0.1 MG tablet Take 1 tablet (0.1 mg total) by mouth 3 (three) times daily. 02/04/22   Fay Records, MD  dorzolamide-timolol (COSOPT) 22.3-6.8 MG/ML ophthalmic solution Place 1 drop into both eyes 2 (two) times daily. 06/22/19   [provider]  flecainide (TAMBOCOR) 50 MG tablet TAKE 1 TABLET BY MOUTH TWICE A DAY 05/10/21   Fay Records, MD  hydrALAZINE (APRESOLINE) 100 MG tablet TAKE 1 TABLET BY MOUTH 3 TIMES DAILY. 12/18/21   Fay Records, MD  levothyroxine (SYNTHROID) 50 MCG tablet TAKE 1 TABLET BY MOUTH EVERY DAY 05/10/21   Fay Records, MD  linaclotide [facility]) 72 MCG capsule Take 72 mcg by mouth as needed (for constipation).    [provider]  olmesartan (BENICAR) 40 MG tablet Take 1 tablet (40 mg total) by mouth daily. 01/24/22   Swinyer, Lanice Schwab, NP  prednisoLONE acetate (PRED FORTE) 1 % ophthalmic suspension Place 1 drop into the left eye every 4 (four) hours. 01/23/22   [provider]  traZODone (DESYREL) 50 MG tablet Take 50 mg by mouth at bedtime as needed for sleep. 12/18/21   [provider]  XARELTO 15 MG TABS tablet TAKE 1 TABLET BY MOUTH EVERY DAY WITH SUPPER 01/28/22   Fay Records, MD    Physical Exam: BP (!) 141/68    Pulse 68   Temp 97.6 F (36.4 C) (Oral)   Resp 13   Ht '5\' 7"'$  (1.702 m)   Wt 56.7 kg   LMP  (LMP Unknown)   SpO2 95%   BMI 19.58 kg/m   General: 87 y.o. year-old female well developed well nourished in no acute distress.  Alert and oriented x3. HEENT: Left infraorbital hematoma, suture of laceration of left eyebrow. Neck: Supple, trachea medial Cardiovascular: Regular rate and rhythm with no rubs or gallops.  No thyromegaly or JVD noted.  No lower extremity edema. 2/4 pulses in all 4 extremities. Respiratory: Clear to auscultation with no wheezes or rales. Good inspiratory effort. Abdomen: Soft, nontender nondistended with normal bowel sounds x4 quadrants. Muskuloskeletal: No cyanosis, clubbing or edema noted bilaterally Neuro: CN II-XII intact, strength 5/5  x 4, sensation, reflexes intact Skin: No ulcerative lesions noted or rashes Psychiatry: Judgement and insight appear normal. Mood is appropriate for condition and setting          Labs on Admission:  Basic Metabolic Panel: Recent Labs  Lab 02/14/22 1946  NA 136  K 4.2  CL 102  CO2 23  GLUCOSE 103*  BUN 25*  CREATININE 1.49*  CALCIUM 9.8   Liver Function Tests: No results for input(s): "AST", "ALT", "ALKPHOS", "BILITOT", "PROT", "ALBUMIN" in the last 168 hours. No results for input(s): "LIPASE", "AMYLASE" in the last 168 hours. No results for input(s): "AMMONIA" in the last 168 hours. CBC: Recent Labs  Lab 02/14/22 1946  WBC 5.7  NEUTROABS 3.8  HGB 10.6*  HCT 32.9*  MCV 100.6*  PLT 224   Cardiac Enzymes: No results for input(s): "CKTOTAL", "CKMB", "CKMBINDEX", "TROPONINI" in the last 168 hours.  BNP (last 3 results) No results for input(s): "BNP" in the last 8760 hours.  ProBNP (last 3 results) Recent Labs    03/05/21 1356  PROBNP 623    CBG: No results for input(s): "GLUCAP" in the last 168 hours.  Radiological Exams on Admission: CT Head Wo Contrast  Result Date: 02/14/2022 CLINICAL DATA:   Facial trauma. EXAM: CT HEAD WITHOUT CONTRAST CT MAXILLOFACIAL WITHOUT CONTRAST CT CERVICAL SPINE WITHOUT CONTRAST TECHNIQUE: Multidetector CT imaging of the head, cervical spine, and maxillofacial structures were performed using the standard protocol without intravenous contrast. Multiplanar CT image reconstructions of the cervical spine and maxillofacial structures were also generated. RADIATION DOSE REDUCTION: This exam was performed according to the departmental dose-optimization program which includes automated exposure control, adjustment of the mA and/or kV according to patient size and/or use of iterative reconstruction technique. COMPARISON:  None Available. FINDINGS: CT HEAD FINDINGS Brain: The ventricles and sulci appropriate size for patient's age. The gray-white matter discrimination is preserved. There is no acute intracranial hemorrhage. No mass effect or midline shift. No extra-axial fluid collection. Vascular: No hyperdense vessel or unexpected calcification. Skull: Normal. Negative for fracture or focal lesion. Other: None CT MAXILLOFACIAL FINDINGS Osseous: Nondisplaced fracture of the anterior and posterolateral walls of the left maxillary sinus. There is mildly displaced fracture of the left orbital floor. No mandibular dislocation. Orbits: The globes and retro-orbital fat are preserved. Sinuses: There is blood product within the left maxillary sinus and several ethmoid air cells. Soft tissues: Mild left periorbital hematoma. CT CERVICAL SPINE FINDINGS Alignment: No acute subluxation. There is reversal of normal cervical lordosis which may be positional or due to muscle spasm. There is grade 1 C3-C4 anterolisthesis. Skull base and vertebrae: No acute fracture.  Osteopenia. Soft tissues and spinal canal: No prevertebral fluid or swelling. No visible canal hematoma. Disc levels: No acute findings. Multilevel degenerative changes and facet arthropathy. Upper chest: Negative. Other: Bilateral  carotid bulb calcified plaques. IMPRESSION: 1. No acute intracranial pathology. 2. No acute/traumatic cervical spine pathology. 3. Nondisplaced fracture of the anterior and posterolateral walls of the left maxillary sinus. Mildly displaced fracture of the left orbital floor. Electronically Signed   By: Anner Crete M.D.   On: 02/14/2022 23:26   CT Maxillofacial WO CM  Result Date: 02/14/2022 CLINICAL DATA:  Facial trauma. EXAM: CT HEAD WITHOUT CONTRAST CT MAXILLOFACIAL WITHOUT CONTRAST CT CERVICAL SPINE WITHOUT CONTRAST TECHNIQUE: Multidetector CT imaging of the head, cervical spine, and maxillofacial structures were performed using the standard protocol without intravenous contrast. Multiplanar CT image reconstructions of the cervical spine and maxillofacial structures were  also generated. RADIATION DOSE REDUCTION: This exam was performed according to the departmental dose-optimization program which includes automated exposure control, adjustment of the mA and/or kV according to patient size and/or use of iterative reconstruction technique. COMPARISON:  None Available. FINDINGS: CT HEAD FINDINGS Brain: The ventricles and sulci appropriate size for patient's age. The gray-white matter discrimination is preserved. There is no acute intracranial hemorrhage. No mass effect or midline shift. No extra-axial fluid collection. Vascular: No hyperdense vessel or unexpected calcification. Skull: Normal. Negative for fracture or focal lesion. Other: None CT MAXILLOFACIAL FINDINGS Osseous: Nondisplaced fracture of the anterior and posterolateral walls of the left maxillary sinus. There is mildly displaced fracture of the left orbital floor. No mandibular dislocation. Orbits: The globes and retro-orbital fat are preserved. Sinuses: There is blood product within the left maxillary sinus and several ethmoid air cells. Soft tissues: Mild left periorbital hematoma. CT CERVICAL SPINE FINDINGS Alignment: No acute subluxation.  There is reversal of normal cervical lordosis which may be positional or due to muscle spasm. There is grade 1 C3-C4 anterolisthesis. Skull base and vertebrae: No acute fracture.  Osteopenia. Soft tissues and spinal canal: No prevertebral fluid or swelling. No visible canal hematoma. Disc levels: No acute findings. Multilevel degenerative changes and facet arthropathy. Upper chest: Negative. Other: Bilateral carotid bulb calcified plaques. IMPRESSION: 1. No acute intracranial pathology. 2. No acute/traumatic cervical spine pathology. 3. Nondisplaced fracture of the anterior and posterolateral walls of the left maxillary sinus. Mildly displaced fracture of the left orbital floor. Electronically Signed   By: Anner Crete M.D.   On: 02/14/2022 23:26   CT Cervical Spine Wo Contrast  Result Date: 02/14/2022 CLINICAL DATA:  Facial trauma. EXAM: CT HEAD WITHOUT CONTRAST CT MAXILLOFACIAL WITHOUT CONTRAST CT CERVICAL SPINE WITHOUT CONTRAST TECHNIQUE: Multidetector CT imaging of the head, cervical spine, and maxillofacial structures were performed using the standard protocol without intravenous contrast. Multiplanar CT image reconstructions of the cervical spine and maxillofacial structures were also generated. RADIATION DOSE REDUCTION: This exam was performed according to the departmental dose-optimization program which includes automated exposure control, adjustment of the mA and/or kV according to patient size and/or use of iterative reconstruction technique. COMPARISON:  None Available. FINDINGS: CT HEAD FINDINGS Brain: The ventricles and sulci appropriate size for patient's age. The gray-white matter discrimination is preserved. There is no acute intracranial hemorrhage. No mass effect or midline shift. No extra-axial fluid collection. Vascular: No hyperdense vessel or unexpected calcification. Skull: Normal. Negative for fracture or focal lesion. Other: None CT MAXILLOFACIAL FINDINGS Osseous: Nondisplaced  fracture of the anterior and posterolateral walls of the left maxillary sinus. There is mildly displaced fracture of the left orbital floor. No mandibular dislocation. Orbits: The globes and retro-orbital fat are preserved. Sinuses: There is blood product within the left maxillary sinus and several ethmoid air cells. Soft tissues: Mild left periorbital hematoma. CT CERVICAL SPINE FINDINGS Alignment: No acute subluxation. There is reversal of normal cervical lordosis which may be positional or due to muscle spasm. There is grade 1 C3-C4 anterolisthesis. Skull base and vertebrae: No acute fracture.  Osteopenia. Soft tissues and spinal canal: No prevertebral fluid or swelling. No visible canal hematoma. Disc levels: No acute findings. Multilevel degenerative changes and facet arthropathy. Upper chest: Negative. Other: Bilateral carotid bulb calcified plaques. IMPRESSION: 1. No acute intracranial pathology. 2. No acute/traumatic cervical spine pathology. 3. Nondisplaced fracture of the anterior and posterolateral walls of the left maxillary sinus. Mildly displaced fracture of the left orbital floor. Electronically Signed  By: Anner Crete M.D.   On: 02/14/2022 23:26   DG Elbow Complete Left  Result Date: 02/14/2022 CLINICAL DATA:  Fall, left elbow pain EXAM: LEFT ELBOW - COMPLETE 3+ VIEW COMPARISON:  None Available. FINDINGS: There is an acute comminuted intra-articular fracture of the olecranonl the proximally 3 mm distraction of the major fracture fragments. No significant articular incongruity. Normal alignment. Large left elbow effusion. Mild soft tissue swelling superficial to the olecranon. IMPRESSION: 1. Acute comminuted intra-articular olecranon fracture. Electronically Signed   By: Fidela Salisbury M.D.   On: 02/14/2022 23:22    EKG: I independently viewed the EKG done and my findings are as followed: Normal sinus rhythm at a rate of 70 bpm with RBBB and LPFB  Assessment/Plan Present on Admission:   Closed fracture of left olecranon process  Essential hypertension  Acquired hypothyroidism  Principal Problem:   Hypertensive urgency Active Problems:   Essential hypertension   History of atrial fibrillation   Acquired hypothyroidism   Closed fracture of left olecranon process   Fall from ground level  Hypertensive urgency Essential Hypertension Clonidine was given in the ED, but SBP continues to be greater than 200 Continue nicardipine drip with plan to transition to oral meds when BP is better controlled  Fall from ground level Closed fracture of left olecranon process Fracture of left maxillary sinus Mildly displaced fracture of left orbital floor Continue fall precaution Continue Tylenol as needed Continue PT/OT eval and treat Cardiac surgery was consulted and recommended splinting the elbow and to follow-up as an outpatient per EDP. Consider reconsult of orthopedic surgeon for worsening of symptoms  Acquired hypothyroidism Continue Synthroid  History of atrial fibrillation/atrial flutter status post ablation Continue Xarelto  DVT prophylaxis: Xarelto  Code Status: Full code  Consults: None  Family Communication: Husband at bedside (all questions answered to satisfaction)  Severity of Illness: The appropriate patient status for this patient is OBSERVATION. Observation status is judged to be reasonable and necessary in order to provide the required intensity of service to ensure the patient's safety. The patient's presenting symptoms, physical exam findings, and initial radiographic and laboratory data in the context of their medical condition is felt to place them at decreased risk for further clinical deterioration. Furthermore, it is anticipated that the patient will be medically stable for discharge from the hospital within 2 midnights of admission.   Author: Bernadette Hoit, DO 02/15/2022 2:05 AM  For on call review [email or web address].

## 2022-02-15 NOTE — ED Notes (Signed)
Pt assisted to bedside toilet, pt unable to urinate with purewick- pt also had small BM

## 2022-02-15 NOTE — Evaluation (Signed)
Occupational Therapy Evaluation Patient Details Name: Amy [name] MRN: [id number] DOB: 1935-06-18 Today's Date: 02/15/2022   History of Present Illness Amy [name] is a 87 y.o. female with medical history significant of hypertension, hypothyroidism, A-Fib/A-flutter s/p ablation who presents to the emergency department accompanied by husband due to elevated blood pressure.  Patient has a history of difficult to control hypertension, she checked her BP this evening around 5:30 PM and it was greater than 951 systolic, she took a dose of clonidine as recommended by her PCP.  BP continued to stay elevated, though she had no symptoms.  It was noted that patient's losartan was recently changed to olmesartan and amlodipine was being weaned down by cardiologist due to ankle swelling within the last 3 weeks.  She denies chest pain, shortness of breath, fever, chills, headache, blurry vision   Clinical Impression   Pt agreed to Ot/PT evaluation. Pt reports that she lives at home with her husband who is available to provide 24/7 assist if needed after d/c. Pt completed bed mobility independently going from supine to sitting EOB, BP assessed sitting EOB ( 147/62). Pt reports she was independent with all ADLS/IADLS prior to fall. Pt able to ambulate around room and hallway with CGA due to history of falls. She reports that she feels comfortable to d/c home with assist from husband. Pt does not require any futher OT services while in acute care setting. Discharge recommendations are written below.      Recommendations for follow up therapy are one component of a multi-disciplinary discharge planning process, led by the attending physician.  Recommendations may be updated based on patient status, additional functional criteria and insurance authorization.   Follow Up Recommendations  Home health OT     Assistance Recommended at Discharge PRN  Patient can return home with the following A little help with  bathing/dressing/bathroom    Functional Status Assessment  Patient has had a recent decline in their functional status and demonstrates the ability to make significant improvements in function in a reasonable and predictable amount of time.  Equipment Recommendations  None recommended by OT    Recommendations for Other Services       Precautions / Restrictions Precautions Precautions: Fall Required Braces or Orthoses: Sling (L arm sling) Restrictions Weight Bearing Restrictions: No Other Position/Activity Restrictions: L arm in sling due to olecranon fx, did not see WB orders but followed WB precautions for LUE.      Mobility Bed Mobility Overal bed mobility: Independent             General bed mobility comments: Pt was able to go from supine to sitting EOB independently. Educated spouse on ways he can assist her if needed at home    Transfers Overall transfer level: Independent Equipment used: None               General transfer comment: pt completed sit to stand t/f independently      Balance Overall balance assessment: Modified Independent, History of Falls (CGA due to previous falls)                                         ADL either performed or assessed with clinical judgement   ADL Overall ADL's : Modified independent  General ADL Comments: Assuming pt will need some assist bathing and dressing due to L arm immobilized in sling. Assuming she can complete grooming independently.     Vision Baseline Vision/History: 0 No visual deficits Ability to See in Adequate Light: 0 Adequate Patient Visual Report:  (L eye contusion causing eye swelling) Vision Assessment?: No apparent visual deficits                Pertinent Vitals/Pain Pain Assessment Pain Assessment: No/denies pain     Hand Dominance     Extremity/Trunk Assessment Upper Extremity Assessment Upper Extremity  Assessment: LUE deficits/detail LUE Deficits / Details: Limitied mobility due to olecranon fx, LUE is in sling- did not assess movement LUE: Unable to fully assess due to immobilization   Lower Extremity Assessment Lower Extremity Assessment: Defer to PT evaluation   Cervical / Trunk Assessment Cervical / Trunk Assessment: Normal   Communication Communication Communication: No difficulties   Cognition Arousal/Alertness: Awake/alert Behavior During Therapy: WFL for tasks assessed/performed Overall Cognitive Status: Within Functional Limits for tasks assessed                                       General Comments  Bruising to L eye               Home Living Family/patient expects to be discharged to:: Private residence Living Arrangements: Spouse/significant other Available Help at Discharge: Family Type of Home: House Home Access: Stairs to enter Technical brewer of Steps: 1 Entrance Stairs-Rails: None Home Layout: Two level Alternate Level Stairs-Number of Steps:  (Pt reports she has a basement but does not go down)   Bathroom Shower/Tub: Teacher, early years/pre: Standard Bathroom Accessibility: Yes How Accessible: Accessible via walker Home Equipment: Rolling Walker (2 wheels);Cane - single point;BSC/3in1          Prior Functioning/Environment Prior Level of Function : Independent/Modified Independent             Mobility Comments: Pt is a Hydrographic surveyor and still drives. ADLs Comments: Pt reports that prior to fall she was independent with all ADL's- bathing, dressing, cooking, cleaning, driving, shopping.                                Co-evaluation PT/OT/SLP Co-Evaluation/Treatment: Yes Reason for Co-Treatment: To address functional/ADL transfers   OT goals addressed during session: ADL's and self-care      AM-PAC OT "6 Clicks" Daily Activity     Outcome Measure Help from another person eating  meals?: None Help from another person taking care of personal grooming?: None Help from another person toileting, which includes using toliet, bedpan, or urinal?: None Help from another person bathing (including washing, rinsing, drying)?: A Little Help from another person to put on and taking off regular upper body clothing?: A Little Help from another person to put on and taking off regular lower body clothing?: A Little 6 Click Score: 21   End of Session Nurse Communication: Mobility status (RN notified of pt nausea)  Activity Tolerance: Patient tolerated treatment well Patient left: in bed;with call bell/phone within reach;with family/visitor present  OT Visit Diagnosis: History of falling (Z91.81)                Time: 8099-8338 OT Time Calculation (min): 17 min Charges:  OT General Charges $OT  Visit: 1 Visit OT Evaluation $OT Eval Low Complexity: 1 Low  Frederic Jericho, OTR/L 02/15/2022, 9:19 AM

## 2022-02-15 NOTE — Hospital Course (Signed)
87 year old female with history of hypertension, hypothyroidism, atrial fibrillation/atrial flutter/tach post ablation presenting initially for elevated blood pressures.   she checked her BP this evening around 5:30 PM and it was greater than 299 systolic, she took a dose of clonidine as recommended by her PCP.  BP continued to stay elevated; as result, she came to the emergency department for further evaluation.  While in the emergency department, the patient was given clonidine with improvement of her blood pressure.  She was getting ready for discharge and walking out the door when she had a syncopal event.  She fell onto the floor face first.  She did not have any prodromal symptoms.  She did not have any tonic-clonic activity.  Prior to coming to the emergency department, the patient had been in her usual state of health. Patient denies fevers, chills, headache, chest pain, dyspnea, nausea, vomiting, diarrhea, abdominal pain, dysuria, hematuria, hematochezia, and melena. The only medication change of note was recently on 01/24/2022 when her losartan was changed to olmesartan and her amlodipine was decreased from 10 mg to 5 mg.  Upon her return, CT of the brain was negative and CT of the cervical spine was negative for any fracture or subluxation.  CT maxillofacial did show a nondisplaced fracture of the anterior and posterior lateral walls of the maxillary sinus.  There is also mild displaced fracture of the left orbital floor with no entrapment mentioned.  Her visual acuity remained good. She was placed on a Cardene drip and admitted for further evaluation and treatment of her syncope.

## 2022-02-15 NOTE — TOC Progression Note (Signed)
  Transition of Care [facility]) Screening Note   Patient Details  Name: Amy [name] Date of Birth: February 16, 1935   Transition of Care [facility]) CM/SW Contact:    Boneta Lucks, RN Phone Number: 02/15/2022, 11:28 AM  PT eval pending, TOC following.  Transition of Care Department [facility]) has reviewed patient and no TOC needs have been identified at this time. We will continue to monitor patient advancement through interdisciplinary progression rounds. If new patient transition needs arise, please place a TOC consult.    Expected Discharge Plan: Home/Self Care Barriers to Discharge: Continued Medical Work up  Expected Discharge Plan and Services      Living arrangements for the past 2 months: Single Family Home                    Social Determinants of Health (SDOH) Interventions SDOH Screenings   Food Insecurity: No Food Insecurity (06/26/2021)  Housing: Low Risk  (06/26/2021)  Transportation Needs: No Transportation Needs (06/26/2021)  Alcohol Screen: Low Risk  (06/26/2021)  Depression (PHQ2-9): Low Risk  (10/30/2021)  Financial Resource Strain: Low Risk  (06/26/2021)  Physical Activity: Sufficiently Active (06/26/2021)  Social Connections: Socially Integrated (06/26/2021)  Stress: No Stress Concern Present (06/26/2021)  Tobacco Use: Low Risk  (02/15/2022)    Readmission Risk Interventions     No data to display

## 2022-02-15 NOTE — ED Notes (Signed)
Pt assisted to bedside toilet, pt urinated and had small formed BM

## 2022-02-15 NOTE — Progress Notes (Signed)
EEG complete - results pending 

## 2022-02-15 NOTE — Progress Notes (Addendum)
PROGRESS NOTE  Amy [name] GQB:[id number] DOB: [birth year] DOA: 02/14/2022 PCP: Binnie Rail, MD  Brief History:  87 year old female with history of hypertension, hypothyroidism, atrial fibrillation/atrial flutter/tach post ablation presenting initially for elevated blood pressures.   she checked her BP this evening around 5:30 PM and it was greater than 828 systolic, she took a dose of clonidine as recommended by her PCP.  BP continued to stay elevated; as result, she came to the emergency department for further evaluation.  While in the emergency department, the patient was given clonidine with improvement of her blood pressure.  She was getting ready for discharge and walking out the door when she had a syncopal event.  She fell onto the floor face first.  She did not have any prodromal symptoms.  She did not have any tonic-clonic activity.  Prior to coming to the emergency department, the patient had been in her usual state of health. Patient denies fevers, chills, headache, chest pain, dyspnea, nausea, vomiting, diarrhea, abdominal pain, dysuria, hematuria, hematochezia, and melena. The only medication change of note was recently on 01/24/2022 when her losartan was changed to olmesartan and her amlodipine was decreased from 10 mg to 5 mg.  Upon her return, CT of the brain was negative and CT of the cervical spine was negative for any fracture or subluxation.  CT maxillofacial did show a nondisplaced fracture of the anterior and posterior lateral walls of the maxillary sinus.  There is also mild displaced fracture of the left orbital floor with no entrapment mentioned.  Her visual acuity remained good. She was placed on a Cardene drip and admitted for further evaluation and treatment of her syncope.   Assessment/Plan: Syncope -Etiology unclear -Patient without prodromal symptoms -Remain on telemetry -Cardiology consult -Echo -orthostatics -EEG  Paroxysmal atrial  fibrillation -Status post ablation -Continue flecainide -Holding rivaroxaban temporarily secondary to facial fracture. -Remains in sinus rhythm -Continue atenolol  Hypertensive urgency -Patient states that her systolic blood pressures have gradually increased over the past 2 to 3 days prior to admission -Patient does have a history of renal artery stenosis which has been stented 01/2021 -Restart atenolol, amlodipine, clonidine, hydralazine and wean off Cardene drip -Further adjustments based on blood pressure readings  Renal artery stenosis -Right renal artery stent 01/2021  -Renal artery duplex 11/20/21 revealed >60% stenosis of right renal artery, no evidence of left renal artery stenosis.  Renal artery stent patent -follow up with [facility] 12/31/21--continue to monitor the patient's right renal artery stenosis since her blood pressure is well-controlled with medication management; follow up 6 months  Maxillary and orbital wall fractures -Discussed with ENT, Dr. Viona Gilmore. [place]>>no reflags, continue nonoperative management -hold Xarelto temporarily  Left upper olecranon fracture -EDP discussed with orthopedics, Dr. Aline Brochure splinted>> can follow-up in the clinic   Family Communication:   spouse updated 1/26  Consultants:  cardiology  Code Status:  FULL   DVT Prophylaxis: xarelto   Procedures: As Listed in Progress Note Above  Antibiotics: None    Total time spent 50 minutes.  Greater than 50% spent face to face counseling and coordinating care.    Subjective: Patient denies fevers, chills, headache, chest pain, dyspnea, nausea, vomiting, diarrhea, abdominal pain, dysuria, hematuria, hematochezia, and melena.   Objective: Vitals:   02/15/22 0630 02/15/22 0700 02/15/22 0719 02/15/22 0811  BP: (!) 150/61 (!) 142/58 (!) 148/54 (!) 145/62  Pulse: 65 62 66 62  Resp: '13 12 18 '$ 16  Temp:      TempSrc:      SpO2: 93% 91% 94% 97%  Weight:      Height:        Intake/Output  Summary (Last 24 hours) at 02/15/2022 0831 Last data filed at 02/15/2022 4287 Gross per 24 hour  Intake 1137.15 ml  Output --  Net 1137.15 ml   Weight change:  Exam:  General:  Pt is alert, follows commands appropriately, not in acute distress HEENT: No icterus, No thrush, No neck mass, /AT Cardiovascular: RRR, S1/S2, no rubs, no gallops Respiratory: CTA bilaterally, no wheezing, no crackles, no rhonchi Abdomen: Soft/+BS, non tender, non distended, no guarding Extremities: No edema, No lymphangitis, No petechiae, No rashes, no synovitis   Data Reviewed: I have personally reviewed following labs and imaging studies Basic Metabolic Panel: Recent Labs  Lab 02/14/22 1946 02/15/22 0329  NA 136 137  K 4.2 3.4*  CL 102 105  CO2 23 20*  GLUCOSE 103* 140*  BUN 25* 24*  CREATININE 1.49* 1.27*  CALCIUM 9.8 8.9  MG  --  1.6*  PHOS  --  2.8   Liver Function Tests: Recent Labs  Lab 02/15/22 0329  AST 18  ALT 14  ALKPHOS 52  BILITOT 0.9  PROT 6.5  ALBUMIN 3.7   No results for input(s): "LIPASE", "AMYLASE" in the last 168 hours. No results for input(s): "AMMONIA" in the last 168 hours. Coagulation Profile: No results for input(s): "INR", "PROTIME" in the last 168 hours. CBC: Recent Labs  Lab 02/14/22 1946 02/15/22 0329  WBC 5.7 11.5*  NEUTROABS 3.8  --   HGB 10.6* 9.6*  HCT 32.9* 29.5*  MCV 100.6* 101.4*  PLT 224 227   Cardiac Enzymes: No results for input(s): "CKTOTAL", "CKMB", "CKMBINDEX", "TROPONINI" in the last 168 hours. BNP: Invalid input(s): "POCBNP" CBG: No results for input(s): "GLUCAP" in the last 168 hours. HbA1C: No results for input(s): "HGBA1C" in the last 72 hours. Urine analysis:    Component Value Date/Time   APPEARANCEUR Clear 04/08/2017 1415   GLUCOSEU Negative 04/08/2017 1415   BILIRUBINUR Negative 04/08/2017 1415   PROTEINUR Negative 04/08/2017 1415   NITRITE Negative 04/08/2017 1415   LEUKOCYTESUR 2+ (A) 04/08/2017 1415   Sepsis  Labs: '@LABRCNTIP'$ (procalcitonin:4,lacticidven:4) )No results found for this or any previous visit (from the past 240 hour(s)).   Scheduled Meds:  amLODipine  5 mg Oral Daily   atenolol  50 mg Oral Daily   cloNIDine  0.1 mg Oral TID   flecainide  50 mg Oral Q12H   hydrALAZINE  100 mg Oral Q8H   levothyroxine  50 mcg Oral Q0600   melatonin  6 mg Oral QHS   Rivaroxaban  15 mg Oral Daily   Continuous Infusions:  Procedures/Studies: CT Head Wo Contrast  Result Date: 02/14/2022 CLINICAL DATA:  Facial trauma. EXAM: CT HEAD WITHOUT CONTRAST CT MAXILLOFACIAL WITHOUT CONTRAST CT CERVICAL SPINE WITHOUT CONTRAST TECHNIQUE: Multidetector CT imaging of the head, cervical spine, and maxillofacial structures were performed using the standard protocol without intravenous contrast. Multiplanar CT image reconstructions of the cervical spine and maxillofacial structures were also generated. RADIATION DOSE REDUCTION: This exam was performed according to the departmental dose-optimization program which includes automated exposure control, adjustment of the mA and/or kV according to patient size and/or use of iterative reconstruction technique. COMPARISON:  None Available. FINDINGS: CT HEAD FINDINGS Brain: The ventricles and sulci appropriate size for patient's age. The gray-white matter discrimination is preserved. There is no acute intracranial hemorrhage.  No mass effect or midline shift. No extra-axial fluid collection. Vascular: No hyperdense vessel or unexpected calcification. Skull: Normal. Negative for fracture or focal lesion. Other: None CT MAXILLOFACIAL FINDINGS Osseous: Nondisplaced fracture of the anterior and posterolateral walls of the left maxillary sinus. There is mildly displaced fracture of the left orbital floor. No mandibular dislocation. Orbits: The globes and retro-orbital fat are preserved. Sinuses: There is blood product within the left maxillary sinus and several ethmoid air cells. Soft tissues:  Mild left periorbital hematoma. CT CERVICAL SPINE FINDINGS Alignment: No acute subluxation. There is reversal of normal cervical lordosis which may be positional or due to muscle spasm. There is grade 1 C3-C4 anterolisthesis. Skull base and vertebrae: No acute fracture.  Osteopenia. Soft tissues and spinal canal: No prevertebral fluid or swelling. No visible canal hematoma. Disc levels: No acute findings. Multilevel degenerative changes and facet arthropathy. Upper chest: Negative. Other: Bilateral carotid bulb calcified plaques. IMPRESSION: 1. No acute intracranial pathology. 2. No acute/traumatic cervical spine pathology. 3. Nondisplaced fracture of the anterior and posterolateral walls of the left maxillary sinus. Mildly displaced fracture of the left orbital floor. Electronically Signed   By: Anner Crete M.D.   On: 02/14/2022 23:26   CT Maxillofacial WO CM  Result Date: 02/14/2022 CLINICAL DATA:  Facial trauma. EXAM: CT HEAD WITHOUT CONTRAST CT MAXILLOFACIAL WITHOUT CONTRAST CT CERVICAL SPINE WITHOUT CONTRAST TECHNIQUE: Multidetector CT imaging of the head, cervical spine, and maxillofacial structures were performed using the standard protocol without intravenous contrast. Multiplanar CT image reconstructions of the cervical spine and maxillofacial structures were also generated. RADIATION DOSE REDUCTION: This exam was performed according to the departmental dose-optimization program which includes automated exposure control, adjustment of the mA and/or kV according to patient size and/or use of iterative reconstruction technique. COMPARISON:  None Available. FINDINGS: CT HEAD FINDINGS Brain: The ventricles and sulci appropriate size for patient's age. The gray-white matter discrimination is preserved. There is no acute intracranial hemorrhage. No mass effect or midline shift. No extra-axial fluid collection. Vascular: No hyperdense vessel or unexpected calcification. Skull: Normal. Negative for  fracture or focal lesion. Other: None CT MAXILLOFACIAL FINDINGS Osseous: Nondisplaced fracture of the anterior and posterolateral walls of the left maxillary sinus. There is mildly displaced fracture of the left orbital floor. No mandibular dislocation. Orbits: The globes and retro-orbital fat are preserved. Sinuses: There is blood product within the left maxillary sinus and several ethmoid air cells. Soft tissues: Mild left periorbital hematoma. CT CERVICAL SPINE FINDINGS Alignment: No acute subluxation. There is reversal of normal cervical lordosis which may be positional or due to muscle spasm. There is grade 1 C3-C4 anterolisthesis. Skull base and vertebrae: No acute fracture.  Osteopenia. Soft tissues and spinal canal: No prevertebral fluid or swelling. No visible canal hematoma. Disc levels: No acute findings. Multilevel degenerative changes and facet arthropathy. Upper chest: Negative. Other: Bilateral carotid bulb calcified plaques. IMPRESSION: 1. No acute intracranial pathology. 2. No acute/traumatic cervical spine pathology. 3. Nondisplaced fracture of the anterior and posterolateral walls of the left maxillary sinus. Mildly displaced fracture of the left orbital floor. Electronically Signed   By: Anner Crete M.D.   On: 02/14/2022 23:26   CT Cervical Spine Wo Contrast  Result Date: 02/14/2022 CLINICAL DATA:  Facial trauma. EXAM: CT HEAD WITHOUT CONTRAST CT MAXILLOFACIAL WITHOUT CONTRAST CT CERVICAL SPINE WITHOUT CONTRAST TECHNIQUE: Multidetector CT imaging of the head, cervical spine, and maxillofacial structures were performed using the standard protocol without intravenous contrast. Multiplanar CT image reconstructions of  the cervical spine and maxillofacial structures were also generated. RADIATION DOSE REDUCTION: This exam was performed according to the departmental dose-optimization program which includes automated exposure control, adjustment of the mA and/or kV according to patient size  and/or use of iterative reconstruction technique. COMPARISON:  None Available. FINDINGS: CT HEAD FINDINGS Brain: The ventricles and sulci appropriate size for patient's age. The gray-white matter discrimination is preserved. There is no acute intracranial hemorrhage. No mass effect or midline shift. No extra-axial fluid collection. Vascular: No hyperdense vessel or unexpected calcification. Skull: Normal. Negative for fracture or focal lesion. Other: None CT MAXILLOFACIAL FINDINGS Osseous: Nondisplaced fracture of the anterior and posterolateral walls of the left maxillary sinus. There is mildly displaced fracture of the left orbital floor. No mandibular dislocation. Orbits: The globes and retro-orbital fat are preserved. Sinuses: There is blood product within the left maxillary sinus and several ethmoid air cells. Soft tissues: Mild left periorbital hematoma. CT CERVICAL SPINE FINDINGS Alignment: No acute subluxation. There is reversal of normal cervical lordosis which may be positional or due to muscle spasm. There is grade 1 C3-C4 anterolisthesis. Skull base and vertebrae: No acute fracture.  Osteopenia. Soft tissues and spinal canal: No prevertebral fluid or swelling. No visible canal hematoma. Disc levels: No acute findings. Multilevel degenerative changes and facet arthropathy. Upper chest: Negative. Other: Bilateral carotid bulb calcified plaques. IMPRESSION: 1. No acute intracranial pathology. 2. No acute/traumatic cervical spine pathology. 3. Nondisplaced fracture of the anterior and posterolateral walls of the left maxillary sinus. Mildly displaced fracture of the left orbital floor. Electronically Signed   By: Anner Crete M.D.   On: 02/14/2022 23:26   DG Elbow Complete Left  Result Date: 02/14/2022 CLINICAL DATA:  Fall, left elbow pain EXAM: LEFT ELBOW - COMPLETE 3+ VIEW COMPARISON:  None Available. FINDINGS: There is an acute comminuted intra-articular fracture of the olecranonl the proximally 3  mm distraction of the major fracture fragments. No significant articular incongruity. Normal alignment. Large left elbow effusion. Mild soft tissue swelling superficial to the olecranon. IMPRESSION: 1. Acute comminuted intra-articular olecranon fracture. Electronically Signed   By: Fidela Salisbury M.D.   On: 02/14/2022 23:22    Orson Eva, DO  [facility]  If 7PM-7AM, please contact night-coverage [email or web address] Password [facility] 02/15/2022, 8:31 AM   LOS: 0 days

## 2022-02-15 NOTE — Evaluation (Signed)
Physical Therapy Evaluation Patient Details Name: Amy [name] MRN: [id number] DOB: May 10, 1935 Today's Date: 02/15/2022  History of Present Illness  Amy [name] is a 87 y.o. female with medical history significant of hypertension, hypothyroidism, A-Fib/A-flutter s/p ablation who presents to the emergency department accompanied by husband due to elevated blood pressure.  Patient has a history of difficult to control hypertension, she checked her BP this evening around 5:30 PM and it was greater than 568 systolic, she took a dose of clonidine as recommended by her PCP.  BP continued to stay elevated, though she had no symptoms.  It was noted that patient's losartan was recently changed to olmesartan and amlodipine was being weaned down by cardiologist due to ankle swelling within the last 3 weeks.  She denies chest pain, shortness of breath, fever, chills, headache, blurry vision   Clinical Impression  Patient presents with LUE sling and demonstrates good return for sitting up at bedside, sit stands, has to frequently lean on nearby objects for support during ambulation, patient states this is baseline for her and limited for ambulation mostly due to fatigue and nausea.  Patient had episode of vomiting once returning to bed and nurse notified.  Patient will benefit from continued skilled physical therapy in hospital and recommended venue below to increase strength, balance, endurance for safe ADLs and gait.      Recommendations for follow up therapy are one component of a multi-disciplinary discharge planning process, led by the attending physician.  Recommendations may be updated based on patient status, additional functional criteria and insurance authorization.  Follow Up Recommendations Home health PT      Assistance Recommended at Discharge Set up Supervision/Assistance  Patient can return home with the following  A little help with walking and/or transfers;A little help with  bathing/dressing/bathroom;Help with stairs or ramp for entrance;Assistance with cooking/housework    Equipment Recommendations None recommended by PT  Recommendations for Other Services       Functional Status Assessment Patient has had a recent decline in their functional status and demonstrates the ability to make significant improvements in function in a reasonable and predictable amount of time.     Precautions / Restrictions Precautions Precautions: Fall Required Braces or Orthoses: Sling Restrictions Weight Bearing Restrictions: Yes Other Position/Activity Restrictions: L arm in sling due to olecranon fx, did not see WB orders but followed WB precautions for LUE.      Mobility  Bed Mobility Overal bed mobility: Independent                  Transfers Overall transfer level: Independent                      Ambulation/Gait Ambulation/Gait assistance: Supervision Gait Distance (Feet): 75 Feet Assistive device: None, 1 person hand held assist Gait Pattern/deviations: Decreased step length - right, Decreased step length - left, Decreased stride length Gait velocity: decreased     General Gait Details: slightly labored cadence with frequent leaning on nearby objects for support without loss of balance, limited mostly due to c/o fatigue and nausea  Stairs            Wheelchair Mobility    Modified Rankin (Stroke Patients Only)       Balance Overall balance assessment: Mild deficits observed, not formally tested  Pertinent Vitals/Pain Pain Assessment Pain Assessment: No/denies pain    Home Living Family/patient expects to be discharged to:: Private residence Living Arrangements: Spouse/significant other Available Help at Discharge: Family Type of Home: House Home Access: Stairs to enter Entrance Stairs-Rails: None Entrance Stairs-Number of Steps: 1 Alternate Level  Stairs-Number of Steps: Patient does not go to basement Home Layout: Two level;Able to live on main level with bedroom/bathroom;Full bath on main level Home Equipment: Rolling Walker (2 wheels);Cane - single point;BSC/3in1      Prior Function Prior Level of Function : Independent/Modified Independent             Mobility Comments: Pt is a Hydrographic surveyor and still drives. ADLs Comments: Pt reports that prior to fall she was independent with all ADL's- bathing, dressing, cooking, cleaning, driving, shopping.     Hand Dominance        Extremity/Trunk Assessment   Upper Extremity Assessment Upper Extremity Assessment: Defer to OT evaluation    Lower Extremity Assessment Lower Extremity Assessment: Generalized weakness    Cervical / Trunk Assessment Cervical / Trunk Assessment: Normal  Communication   Communication: No difficulties  Cognition Arousal/Alertness: Awake/alert Behavior During Therapy: WFL for tasks assessed/performed Overall Cognitive Status: Within Functional Limits for tasks assessed                                          General Comments      Exercises     Assessment/Plan    PT Assessment Patient needs continued PT services  PT Problem List Decreased strength;Decreased activity tolerance;Decreased balance;Decreased mobility       PT Treatment Interventions DME instruction;Gait training;Stair training;Functional mobility training;Therapeutic activities;Therapeutic exercise;Patient/family education;Balance training    PT Goals (Current goals can be found in the Care Plan section)  Acute Rehab PT Goals Patient Stated Goal: return home with family to assist PT Goal Formulation: With patient Time For Goal Achievement: 02/18/22 Potential to Achieve Goals: Good    Frequency Min 3X/week     Co-evaluation PT/OT/SLP Co-Evaluation/Treatment: Yes Reason for Co-Treatment: To address functional/ADL transfers PT goals addressed  during session: Mobility/safety with mobility;Balance         AM-PAC PT "6 Clicks" Mobility  Outcome Measure Help needed turning from your back to your side while in a flat bed without using bedrails?: None Help needed moving from lying on your back to sitting on the side of a flat bed without using bedrails?: None Help needed moving to and from a bed to a chair (including a wheelchair)?: A Little Help needed standing up from a chair using your arms (e.g., wheelchair or bedside chair)?: A Little Help needed to walk in hospital room?: A Little Help needed climbing 3-5 steps with a railing? : A Little 6 Click Score: 20    End of Session   Activity Tolerance: Patient tolerated treatment well;Patient limited by fatigue Patient left: in bed Nurse Communication: Mobility status PT Visit Diagnosis: Unsteadiness on feet (R26.81);Other abnormalities of gait and mobility (R26.89);Muscle weakness (generalized) (M62.81)    Time: 1025-8527 PT Time Calculation (min) (ACUTE ONLY): 30 min   Charges:   PT Evaluation $PT Eval Moderate Complexity: 1 Mod PT Treatments $Therapeutic Activity: 23-37 mins        12:20 PM, 02/15/22 Lonell Grandchild, MPT Physical Therapist with [facility] [address] [phone] office [phone] mobile phone

## 2022-02-15 NOTE — Procedures (Signed)
Patient Name: Amy [name]  MRN: [id number]  Epilepsy Attending: Lora Havens  Referring Physician/Provider: Orson Eva, MD  Date: 02/15/2022 Duration: 23.12 mins  Patient history: 87yo F with syncope. EEG to evaluate for seizure  Level of alertness: Awake, asleep  AEDs during EEG study: None  Technical aspects: This EEG study was done with scalp electrodes positioned according to the 10-20 International system of electrode placement. Electrical activity was reviewed with band pass filter of 1-'70Hz'$ , sensitivity of 7 uV/mm, display speed of 73m/sec with a '60Hz'$  notched filter applied as appropriate. EEG data were recorded continuously and digitally stored.  Video monitoring was available and reviewed as appropriate.  Description: The posterior dominant rhythm consists of 8-9 Hz activity of moderate voltage (25-35 uV) seen predominantly in posterior head regions, symmetric and reactive to eye opening and eye closing. Sleep was characterized by vertex waves, sleep spindles (12 to 14 Hz), maximal frontocentral region.  Hyperventilation and photic stimulation were not performed.     IMPRESSION: This study is within normal limits. No seizures or epileptiform discharges were seen throughout the recording.  Blanchard Willhite OBarbra Sarks

## 2022-02-15 NOTE — ED Notes (Signed)
Pt taken off oxygen to assess on room air- left nare is not longer seeping blood- lac above left eye is no longer oozing blood- icepack applied

## 2022-02-16 DIAGNOSIS — S0292XA Unspecified fracture of facial bones, initial encounter for closed fracture: Secondary | ICD-10-CM | POA: Diagnosis not present

## 2022-02-16 DIAGNOSIS — W19XXXA Unspecified fall, initial encounter: Secondary | ICD-10-CM | POA: Diagnosis not present

## 2022-02-16 DIAGNOSIS — S52022K Displaced fracture of olecranon process without intraarticular extension of left ulna, subsequent encounter for closed fracture with nonunion: Secondary | ICD-10-CM

## 2022-02-16 DIAGNOSIS — R55 Syncope and collapse: Secondary | ICD-10-CM | POA: Diagnosis not present

## 2022-02-16 DIAGNOSIS — I160 Hypertensive urgency: Secondary | ICD-10-CM | POA: Diagnosis not present

## 2022-02-16 LAB — URINALYSIS, ROUTINE W REFLEX MICROSCOPIC
Bilirubin Urine: NEGATIVE
Glucose, UA: NEGATIVE mg/dL
Hgb urine dipstick: NEGATIVE
Ketones, ur: NEGATIVE mg/dL
Nitrite: NEGATIVE
Protein, ur: NEGATIVE mg/dL
Specific Gravity, Urine: 1.003 — ABNORMAL LOW (ref 1.005–1.030)
pH: 6 (ref 5.0–8.0)

## 2022-02-16 LAB — CBC
HCT: 28.4 % — ABNORMAL LOW (ref 36.0–46.0)
Hemoglobin: 9 g/dL — ABNORMAL LOW (ref 12.0–15.0)
MCH: 32.3 pg (ref 26.0–34.0)
MCHC: 31.7 g/dL (ref 30.0–36.0)
MCV: 101.8 fL — ABNORMAL HIGH (ref 80.0–100.0)
Platelets: 206 10*3/uL (ref 150–400)
RBC: 2.79 MIL/uL — ABNORMAL LOW (ref 3.87–5.11)
RDW: 13 % (ref 11.5–15.5)
WBC: 7.9 10*3/uL (ref 4.0–10.5)
nRBC: 0 % (ref 0.0–0.2)

## 2022-02-16 LAB — BASIC METABOLIC PANEL
Anion gap: 11 (ref 5–15)
BUN: 20 mg/dL (ref 8–23)
CO2: 19 mmol/L — ABNORMAL LOW (ref 22–32)
Calcium: 9.2 mg/dL (ref 8.9–10.3)
Chloride: 106 mmol/L (ref 98–111)
Creatinine, Ser: 1.29 mg/dL — ABNORMAL HIGH (ref 0.44–1.00)
GFR, Estimated: 41 mL/min — ABNORMAL LOW (ref >60)
Glucose, Bld: 96 mg/dL (ref 70–99)
Potassium: 4.1 mmol/L (ref 3.5–5.1)
Sodium: 136 mmol/L (ref 135–145)

## 2022-02-16 MED ORDER — ALPRAZOLAM 0.25 MG PO TABS: 0.2500 mg | ORAL_TABLET | Freq: Every evening | ORAL | Status: DC | PRN

## 2022-02-16 MED ORDER — TRAMADOL HCL 50 MG PO TABS: 50.0000 mg | ORAL_TABLET | Freq: Four times a day (QID) | ORAL | 0 refills | Status: DC | PRN

## 2022-02-16 MED ORDER — ALPRAZOLAM 0.25 MG PO TABS: 0.2500 mg | ORAL_TABLET | Freq: Every evening | ORAL | 0 refills | Status: DC | PRN

## 2022-02-16 MED ORDER — TRAMADOL HCL 50 MG PO TABS: 50.0000 mg | ORAL_TABLET | Freq: Four times a day (QID) | ORAL | Status: DC | PRN

## 2022-02-16 NOTE — Discharge Summary (Addendum)
Physician Discharge Summary   Patient: Amy [name] MRN: [id number] DOB: [birth year]  Admit date:     02/14/2022  Discharge date: 02/16/22  Discharge Physician: Shanon Brow Adriana Lina   PCP: Binnie Rail, MD   Recommendations at discharge:   Please follow up with primary care provider within 1-2 weeks  Please repeat BMP and CBC in one week    Hospital Course: 87 year old female with history of hypertension, hypothyroidism, atrial fibrillation/atrial flutter/tach post ablation presenting initially for elevated blood pressures.   she checked her BP this evening around 5:30 PM and it was greater than 024 systolic, she took a dose of clonidine as recommended by her PCP.  BP continued to stay elevated; as result, she came to the emergency department for further evaluation.  While in the emergency department, the patient was given clonidine with improvement of her blood pressure.  She was getting ready for discharge and walking out the door when she had a syncopal event.  She fell onto the floor face first.  She did not have any prodromal symptoms.  She did not have any tonic-clonic activity.  Prior to coming to the emergency department, the patient had been in her usual state of health. Patient denies fevers, chills, headache, chest pain, dyspnea, nausea, vomiting, diarrhea, abdominal pain, dysuria, hematuria, hematochezia, and melena. The only medication change of note was recently on 01/24/2022 when her losartan was changed to olmesartan and her amlodipine was decreased from 10 mg to 5 mg.  Upon her return, CT of the brain was negative and CT of the cervical spine was negative for any fracture or subluxation.  CT maxillofacial did show a nondisplaced fracture of the anterior and posterior lateral walls of the maxillary sinus.  There is also mild displaced fracture of the left orbital floor with no entrapment mentioned.  Her visual acuity remained good. She was placed on a Cardene drip and admitted for further  evaluation and treatment of her syncope.  Assessment and Plan: Syncope -Etiology unclear -Patient without prodromal symptoms -Remain on telemetry -Cardiology consult appreciate>>pt may require outpt ZIO patch -Echo--EF 65-70%, no WMA, G2DD, mild-mod TR -orthostatics on day of d/c negative -EEG--neg for seizure -reviewed telemetry--remained in sinus, no concerning dysrhythmia -repeat EKG on 1/27--unchanged sinus, RBBB, no AVB   Paroxysmal atrial fibrillation -Status post ablation -Continue flecainide -Holding rivaroxaban temporarily secondary to facial fracture. -Remains in sinus rhythm -Continue atenolol   Hypertensive urgency -Patient states that her systolic blood pressures have gradually increased over the past 2 to 3 days prior to admission -Patient does have a history of renal artery stenosis which has been stented 01/2021 -Restart atenolol, amlodipine, clonidine, hydralazine and wean off Cardene drip -SBP remains fairly controlled with most readings in 130-140 range on her home meds   Renal artery stenosis -Right renal artery stent 01/2021  -Renal artery duplex 11/20/21 revealed >60% stenosis of right renal artery, no evidence of left renal artery stenosis.  Renal artery stent patent -follow up with [facility] 12/31/21--continue to monitor the patient's right renal artery stenosis since her blood pressure is well-controlled with medication management; follow up 6 months   Maxillary and orbital wall fractures -Discussed with ENT, Dr. Viona Gilmore. [place]>>no reflags, continue nonoperative management -hold Xarelto--instructed pt to restart on 02/17/22   Left upper olecranon fracture -EDP discussed with orthopedics, Dr. Aline Brochure splinted>> can follow-up in the clinic -outpt OT -keep splint in place until follow up with Dr. Aline Brochure     Pain control - [facility] Controlled Substance  Reporting System database was reviewed. and patient was instructed, not to drive, operate heavy  machinery, perform activities at heights, swimming or participation in water activities or provide baby-sitting services while on Pain, Sleep and Anxiety Medications; until their outpatient Physician has advised to do so again. Also recommended to not to take more than prescribed Pain, Sleep and Anxiety Medications.  Consultants: cardiology Procedures performed: none  Disposition: Home Diet recommendation:  Cardiac diet DISCHARGE MEDICATION: Allergies as of 02/16/2022   No Known Allergies      Medication List     STOP taking these medications    prednisoLONE acetate 1 % ophthalmic suspension Commonly known as: PRED FORTE   traZODone 50 MG tablet Commonly known as: DESYREL       TAKE these medications    acetaminophen 650 MG CR tablet Commonly known as: TYLENOL Take 1,300 mg by mouth 2 (two) times daily as needed for pain.   ALPRAZolam 0.25 MG tablet Commonly known as: XANAX Take 1 tablet (0.25 mg total) by mouth at bedtime as needed for anxiety.   AMBULATORY NON FORMULARY MEDICATION Take 1 drop by mouth daily as needed (pain). CBD Oil 1 dose under tongue once daily as needed for pain   amLODipine 5 MG tablet Commonly known as: NORVASC Take 1 tablet (5 mg total) by mouth daily.   amoxicillin 500 MG capsule Commonly known as: AMOXIL Take 500 mg by mouth 3 (three) times daily as needed (For gum flare up).   atenolol 50 MG tablet Commonly known as: TENORMIN Take 1 tablet (50 mg total) by mouth daily.   CALCIUM + D PO Take 1 tablet by mouth daily.   cloNIDine 0.1 MG tablet Commonly known as: CATAPRES Take 1 tablet (0.1 mg total) by mouth 3 (three) times daily.   dorzolamide-timolol 2-0.5 % ophthalmic solution Commonly known as: COSOPT Place 1 drop into both eyes 2 (two) times daily.   flecainide 50 MG tablet Commonly known as: TAMBOCOR TAKE 1 TABLET BY MOUTH TWICE A DAY   hydrALAZINE 100 MG tablet Commonly known as: APRESOLINE TAKE 1 TABLET BY MOUTH 3  TIMES DAILY.   levothyroxine 50 MCG tablet Commonly known as: SYNTHROID TAKE 1 TABLET BY MOUTH EVERY DAY What changed: when to take this   Linzess 72 MCG capsule Generic drug: linaclotide Take 72 mcg by mouth as needed (for constipation).   olmesartan 40 MG tablet Commonly known as: BENICAR Take 1 tablet (40 mg total) by mouth daily.   traMADol 50 MG tablet Commonly known as: ULTRAM Take 1 tablet (50 mg total) by mouth every 6 (six) hours as needed for moderate pain.   Xarelto 15 MG Tabs tablet Generic drug: Rivaroxaban TAKE 1 TABLET BY MOUTH EVERY DAY WITH SUPPER What changed: See the new instructions.        Follow-up Information     Burns, Claudina Lick, MD. Schedule an appointment as soon as possible for a visit .   Specialty: Internal Medicine Why: For recheck of your symptoms Contact information: [place] [address] [phone]         [facility], [place]. Schedule an appointment as soon as possible for a visit .   Why: For recheck of your symptoms Contact information: [place] [address] [phone]         [facility], [facility] Follow up.   Specialty: Home Health Services Why: PT/OT will call to schedule your first home visit.  Contact information: [address] [place] [address] [phone]         Carole Civil, MD Follow up in 1 week(s).   Specialties: Orthopedic Surgery, Radiology Contact information: [address] [place] [address] [phone]                Discharge Exam: Danley Danker Weights   02/14/22 1915 Feb 28, 2022 1036  Weight: 56.7 kg 56.4 kg   HEENT:  [place]/AT, No thrush, no icterus CV:  RRR, no rub, no S3, no S4 Lung:  CTA, no wheeze, no rhonchi Abd:  soft/+BS, NT Ext:  No edema, no lymphangitis, no synovitis, no rash   Condition at discharge: stable  The results of significant diagnostics from this hospitalization  (including imaging, microbiology, ancillary and laboratory) are listed below for reference.   Imaging Studies: EEG adult  Result Date: 2022/02/28 Lora Havens, MD     February 28, 2022  4:50 PM Patient Name: JEMMA RASP MRN: [id number] Epilepsy Attending: Lora Havens Referring Physician/Provider: Orson Eva, MD Date: 02-28-22 Duration: 23.12 mins Patient history: 87yo F with syncope. EEG to evaluate for seizure Level of alertness: Awake, asleep AEDs during EEG study: None Technical aspects: This EEG study was done with scalp electrodes positioned according to the 10-20 International system of electrode placement. Electrical activity was reviewed with band pass filter of 1-'70Hz'$ , sensitivity of 7 uV/mm, display speed of 43m/sec with a '60Hz'$  notched filter applied as appropriate. EEG data were recorded continuously and digitally stored.  Video monitoring was available and reviewed as appropriate. Description: The posterior dominant rhythm consists of 8-9 Hz activity of moderate voltage (25-35 uV) seen predominantly in posterior head regions, symmetric and reactive to eye opening and eye closing. Sleep was characterized by vertex waves, sleep spindles (12 to 14 Hz), maximal frontocentral region.  Hyperventilation and photic stimulation were not performed.   IMPRESSION: This study is within normal limits. No seizures or epileptiform discharges were seen throughout the recording. PLora Havens  ECHOCARDIOGRAM COMPLETE  Result Date: 12024-02-08   ECHOCARDIOGRAM REPORT   Patient Name:   MDANAMARIE MINAMIDate of Exam: 102-08-24Medical Rec #:  [id number]     Height:       67.0 in Accession #:    [id number]    Weight:       124.3 lb Date of Birth:  705/18/1937      BSA:          1.652 m Patient Age:    89years       BP:           131/66 mmHg Patient Gender: F              HR:           77 bpm. Exam Location:  [facility]cedure: 2D Echo, Cardiac Doppler and Color Doppler Indications:    R55 Syncope   History:        Patient has no prior history of Echocardiogram examinations.                 Arrythmias:Atrial Fibrillation and Atrial Flutter; Risk                 Factors:Hypertension. S/P Syncope and collapse with closed                 fracture of face bones with contusion of face due to fall.  Sonographer:  Alvino Chapel RCS Referring Phys: [id number] [place]  1. Left ventricular ejection fraction, by estimation, is 65 to 70%. The left ventricle has normal function. The left ventricle has no regional wall motion abnormalities. Left ventricular diastolic parameters are consistent with Grade II diastolic dysfunction (pseudonormalization).  2. Right ventricular systolic function is normal. The right ventricular size is normal. There is moderately elevated pulmonary artery systolic pressure. The estimated right ventricular systolic pressure is 45.4 mmHg.  3. Left atrial size was moderately dilated.  4. A small pericardial effusion is present. The pericardial effusion is posterior to the left ventricle.  5. The mitral valve is degenerative. Mild mitral valve regurgitation.  6. Tricuspid valve regurgitation is mild to moderate.  7. The aortic valve is tricuspid. There is mild calcification of the aortic valve. Aortic valve regurgitation is trivial. Aortic valve sclerosis/calcification is present, without any evidence of aortic stenosis.  8. The inferior vena cava is normal in size with greater than 50% respiratory variability, suggesting right atrial pressure of 3 mmHg. Comparison(s): No prior Echocardiogram. FINDINGS  Left Ventricle: Left ventricular ejection fraction, by estimation, is 65 to 70%. The left ventricle has normal function. The left ventricle has no regional wall motion abnormalities. The left ventricular internal cavity size was normal in size. There is  borderline concentric left ventricular hypertrophy. Left ventricular diastolic parameters are consistent with Grade II diastolic  dysfunction (pseudonormalization). Right Ventricle: The right ventricular size is normal. No increase in right ventricular wall thickness. Right ventricular systolic function is normal. There is moderately elevated pulmonary artery systolic pressure. The tricuspid regurgitant velocity is 3.57 m/s, and with an assumed right atrial pressure of 3 mmHg, the estimated right ventricular systolic pressure is 09.8 mmHg. Left Atrium: Left atrial size was moderately dilated. Right Atrium: Right atrial size was normal in size. Pericardium: A small pericardial effusion is present. The pericardial effusion is posterior to the left ventricle. Presence of epicardial fat layer. Mitral Valve: The mitral valve is degenerative in appearance. There is mild thickening of the mitral valve leaflet(s). Mild mitral annular calcification. Mild mitral valve regurgitation. Tricuspid Valve: The tricuspid valve is grossly normal. Tricuspid valve regurgitation is mild to moderate. Aortic Valve: The aortic valve is tricuspid. There is mild calcification of the aortic valve. There is mild to moderate aortic valve annular calcification. Aortic valve regurgitation is trivial. Aortic valve sclerosis/calcification is present, without any evidence of aortic stenosis. Pulmonic Valve: The pulmonic valve was grossly normal. Pulmonic valve regurgitation is trivial. Aorta: The aortic root is normal in size and structure. Venous: The inferior vena cava is normal in size with greater than 50% respiratory variability, suggesting right atrial pressure of 3 mmHg. IAS/Shunts: No atrial level shunt detected by color flow Doppler.  LEFT VENTRICLE PLAX 2D LVIDd:         4.10 cm   Diastology LVIDs:         2.60 cm   LV e' medial:    5.11 cm/s LV PW:         1.00 cm   LV E/e' medial:  24.9 LV IVS:        1.00 cm   LV e' lateral:   7.51 cm/s LVOT diam:     1.70 cm   LV E/e' lateral: 16.9 LV SV:         64 LV SV Index:   38 LVOT Area:     2.27 cm  RIGHT VENTRICLE RV S  prime:  9.90 cm/s TAPSE (M-mode): 2.5 cm LEFT ATRIUM              Index        RIGHT ATRIUM           Index LA diam:        3.30 cm  2.00 cm/m   RA Area:     16.00 cm LA Vol (A2C):   102.0 ml 61.73 ml/m  RA Volume:   36.80 ml  22.27 ml/m LA Vol (A4C):   64.3 ml  38.91 ml/m LA Biplane Vol: 85.3 ml  51.62 ml/m  AORTIC VALVE LVOT Vmax:   115.00 cm/s LVOT Vmean:  73.400 cm/s LVOT VTI:    0.280 m  AORTA Ao Root diam: 2.80 cm MITRAL VALVE                TRICUSPID VALVE MV Area (PHT): 2.66 cm     TR Peak grad:   51.0 mmHg MV Decel Time: 285 msec     TR Vmax:        357.00 cm/s MV E velocity: 127.00 cm/s MV A velocity: 47.00 cm/s   SHUNTS MV E/A ratio:  2.70         Systemic VTI:  0.28 m                             Systemic Diam: 1.70 cm Rozann Lesches MD Electronically signed by Rozann Lesches MD Signature Date/Time: 02/15/2022/2:17:35 PM    Final    CT Head Wo Contrast  Result Date: 02/14/2022 CLINICAL DATA:  Facial trauma. EXAM: CT HEAD WITHOUT CONTRAST CT MAXILLOFACIAL WITHOUT CONTRAST CT CERVICAL SPINE WITHOUT CONTRAST TECHNIQUE: Multidetector CT imaging of the head, cervical spine, and maxillofacial structures were performed using the standard protocol without intravenous contrast. Multiplanar CT image reconstructions of the cervical spine and maxillofacial structures were also generated. RADIATION DOSE REDUCTION: This exam was performed according to the departmental dose-optimization program which includes automated exposure control, adjustment of the mA and/or kV according to patient size and/or use of iterative reconstruction technique. COMPARISON:  None Available. FINDINGS: CT HEAD FINDINGS Brain: The ventricles and sulci appropriate size for patient's age. The gray-white matter discrimination is preserved. There is no acute intracranial hemorrhage. No mass effect or midline shift. No extra-axial fluid collection. Vascular: No hyperdense vessel or unexpected calcification. Skull: Normal. Negative  for fracture or focal lesion. Other: None CT MAXILLOFACIAL FINDINGS Osseous: Nondisplaced fracture of the anterior and posterolateral walls of the left maxillary sinus. There is mildly displaced fracture of the left orbital floor. No mandibular dislocation. Orbits: The globes and retro-orbital fat are preserved. Sinuses: There is blood product within the left maxillary sinus and several ethmoid air cells. Soft tissues: Mild left periorbital hematoma. CT CERVICAL SPINE FINDINGS Alignment: No acute subluxation. There is reversal of normal cervical lordosis which may be positional or due to muscle spasm. There is grade 1 C3-C4 anterolisthesis. Skull base and vertebrae: No acute fracture.  Osteopenia. Soft tissues and spinal canal: No prevertebral fluid or swelling. No visible canal hematoma. Disc levels: No acute findings. Multilevel degenerative changes and facet arthropathy. Upper chest: Negative. Other: Bilateral carotid bulb calcified plaques. IMPRESSION: 1. No acute intracranial pathology. 2. No acute/traumatic cervical spine pathology. 3. Nondisplaced fracture of the anterior and posterolateral walls of the left maxillary sinus. Mildly displaced fracture of the left orbital floor. Electronically Signed   By: Laren Everts.D.  On: 02/14/2022 23:26   CT Maxillofacial WO CM  Result Date: 02/14/2022 CLINICAL DATA:  Facial trauma. EXAM: CT HEAD WITHOUT CONTRAST CT MAXILLOFACIAL WITHOUT CONTRAST CT CERVICAL SPINE WITHOUT CONTRAST TECHNIQUE: Multidetector CT imaging of the head, cervical spine, and maxillofacial structures were performed using the standard protocol without intravenous contrast. Multiplanar CT image reconstructions of the cervical spine and maxillofacial structures were also generated. RADIATION DOSE REDUCTION: This exam was performed according to the departmental dose-optimization program which includes automated exposure control, adjustment of the mA and/or kV according to patient size and/or  use of iterative reconstruction technique. COMPARISON:  None Available. FINDINGS: CT HEAD FINDINGS Brain: The ventricles and sulci appropriate size for patient's age. The gray-white matter discrimination is preserved. There is no acute intracranial hemorrhage. No mass effect or midline shift. No extra-axial fluid collection. Vascular: No hyperdense vessel or unexpected calcification. Skull: Normal. Negative for fracture or focal lesion. Other: None CT MAXILLOFACIAL FINDINGS Osseous: Nondisplaced fracture of the anterior and posterolateral walls of the left maxillary sinus. There is mildly displaced fracture of the left orbital floor. No mandibular dislocation. Orbits: The globes and retro-orbital fat are preserved. Sinuses: There is blood product within the left maxillary sinus and several ethmoid air cells. Soft tissues: Mild left periorbital hematoma. CT CERVICAL SPINE FINDINGS Alignment: No acute subluxation. There is reversal of normal cervical lordosis which may be positional or due to muscle spasm. There is grade 1 C3-C4 anterolisthesis. Skull base and vertebrae: No acute fracture.  Osteopenia. Soft tissues and spinal canal: No prevertebral fluid or swelling. No visible canal hematoma. Disc levels: No acute findings. Multilevel degenerative changes and facet arthropathy. Upper chest: Negative. Other: Bilateral carotid bulb calcified plaques. IMPRESSION: 1. No acute intracranial pathology. 2. No acute/traumatic cervical spine pathology. 3. Nondisplaced fracture of the anterior and posterolateral walls of the left maxillary sinus. Mildly displaced fracture of the left orbital floor. Electronically Signed   By: Anner Crete M.D.   On: 02/14/2022 23:26   CT Cervical Spine Wo Contrast  Result Date: 02/14/2022 CLINICAL DATA:  Facial trauma. EXAM: CT HEAD WITHOUT CONTRAST CT MAXILLOFACIAL WITHOUT CONTRAST CT CERVICAL SPINE WITHOUT CONTRAST TECHNIQUE: Multidetector CT imaging of the head, cervical spine, and  maxillofacial structures were performed using the standard protocol without intravenous contrast. Multiplanar CT image reconstructions of the cervical spine and maxillofacial structures were also generated. RADIATION DOSE REDUCTION: This exam was performed according to the departmental dose-optimization program which includes automated exposure control, adjustment of the mA and/or kV according to patient size and/or use of iterative reconstruction technique. COMPARISON:  None Available. FINDINGS: CT HEAD FINDINGS Brain: The ventricles and sulci appropriate size for patient's age. The gray-white matter discrimination is preserved. There is no acute intracranial hemorrhage. No mass effect or midline shift. No extra-axial fluid collection. Vascular: No hyperdense vessel or unexpected calcification. Skull: Normal. Negative for fracture or focal lesion. Other: None CT MAXILLOFACIAL FINDINGS Osseous: Nondisplaced fracture of the anterior and posterolateral walls of the left maxillary sinus. There is mildly displaced fracture of the left orbital floor. No mandibular dislocation. Orbits: The globes and retro-orbital fat are preserved. Sinuses: There is blood product within the left maxillary sinus and several ethmoid air cells. Soft tissues: Mild left periorbital hematoma. CT CERVICAL SPINE FINDINGS Alignment: No acute subluxation. There is reversal of normal cervical lordosis which may be positional or due to muscle spasm. There is grade 1 C3-C4 anterolisthesis. Skull base and vertebrae: No acute fracture.  Osteopenia. Soft tissues and spinal canal: No  prevertebral fluid or swelling. No visible canal hematoma. Disc levels: No acute findings. Multilevel degenerative changes and facet arthropathy. Upper chest: Negative. Other: Bilateral carotid bulb calcified plaques. IMPRESSION: 1. No acute intracranial pathology. 2. No acute/traumatic cervical spine pathology. 3. Nondisplaced fracture of the anterior and posterolateral  walls of the left maxillary sinus. Mildly displaced fracture of the left orbital floor. Electronically Signed   By: Anner Crete M.D.   On: 02/14/2022 23:26   DG Elbow Complete Left  Result Date: 02/14/2022 CLINICAL DATA:  Fall, left elbow pain EXAM: LEFT ELBOW - COMPLETE 3+ VIEW COMPARISON:  None Available. FINDINGS: There is an acute comminuted intra-articular fracture of the olecranonl the proximally 3 mm distraction of the major fracture fragments. No significant articular incongruity. Normal alignment. Large left elbow effusion. Mild soft tissue swelling superficial to the olecranon. IMPRESSION: 1. Acute comminuted intra-articular olecranon fracture. Electronically Signed   By: Fidela Salisbury M.D.   On: 02/14/2022 23:22    Microbiology: Results for orders placed or performed in visit on 04/08/17  Urine Culture     Status: None   Collection Time: 04/08/17  2:15 PM   Specimen: Urine   URINE  Result Value Ref Range Status   Urine Culture, Routine Final report  Final   Organism ID, Bacteria Comment  Final    Comment: Mixed urogenital flora 25,000-50,000 colony forming units per mL   Microscopic Examination     Status: Abnormal   Collection Time: 04/08/17  2:15 PM   URINE  Result Value Ref Range Status   WBC, UA 6-10 (A) 0 - 5 /hpf Final   RBC, UA None seen 0 - 2 /hpf Final   Epithelial Cells (non renal) >10 (A) 0 - 10 /hpf Final   Renal Epithel, UA None seen None seen /hpf Final   Bacteria, UA Few (A) None seen/Few Final    Labs: CBC: Recent Labs  Lab 02/14/22 1946 02/15/22 0329 02/16/22 0333  WBC 5.7 11.5* 7.9  NEUTROABS 3.8  --   --   HGB 10.6* 9.6* 9.0*  HCT 32.9* 29.5* 28.4*  MCV 100.6* 101.4* 101.8*  PLT 224 227 654   Basic Metabolic Panel: Recent Labs  Lab 02/14/22 1946 02/15/22 0329 02/16/22 0333  NA 136 137 136  K 4.2 3.4* 4.1  CL 102 105 106  CO2 23 20* 19*  GLUCOSE 103* 140* 96  BUN 25* 24* 20  CREATININE 1.49* 1.27* 1.29*  CALCIUM 9.8 8.9 9.2   MG  --  1.6*  --   PHOS  --  2.8  --    Liver Function Tests: Recent Labs  Lab 02/15/22 0329  AST 18  ALT 14  ALKPHOS 52  BILITOT 0.9  PROT 6.5  ALBUMIN 3.7   CBG: No results for input(s): "GLUCAP" in the last 168 hours.  Discharge time spent: greater than 30 minutes.  Signed: Orson Eva, MD [facility] 02/16/2022

## 2022-02-16 NOTE — Progress Notes (Signed)
Discharge instructions given patient and family verbalized understanding. IV removed. Discharged via wheelchair by private vehicle.

## 2022-02-16 NOTE — TOC Initial Note (Signed)
Transition of Care [facility]) - Initial/Assessment Note    Patient Details  Name: Amy [name] MRN: [id number] Date of Birth: 1935-08-02  [facility]) CM/SW Contact:    Boneta Lucks, RN Phone Number: 02/16/2022, 11:25 AM  Clinical Narrative:   Patient discharging home with home health. MD ordering PT/OT.  Patient is agreeable to sending referral to [facility]. Georgina Snell accepted.                 Expected Discharge Plan: Home/Self Care Barriers to Discharge: Continued Medical Work up    Expected Discharge Plan and Services       Living arrangements for the past 2 months: Single Family Home Expected Discharge Date: 02/16/22                   Prior Living Arrangements/Services Living arrangements for the past 2 months: Single Family Home Lives with:: Spouse     Activities of Daily Living Home Assistive Devices/Equipment: None ADL Screening (condition at time of admission) Patient's cognitive ability adequate to safely complete daily activities?: Yes Is the patient deaf or have difficulty hearing?: No Does the patient have difficulty seeing, even when wearing glasses/contacts?: No Does the patient have difficulty concentrating, remembering, or making decisions?: No Patient able to express need for assistance with ADLs?: Yes Does the patient have difficulty dressing or bathing?: Yes Independently performs ADLs?: Yes (appropriate for developmental age) Does the patient have difficulty walking or climbing stairs?: No Weakness of Legs: None Weakness of Arms/Hands: Left     Emotional Assessment    Admission diagnosis:  Syncope and collapse [R55] Primary hypertension [I10] Closed fracture of left olecranon process [S52.022A] Contusion of face, initial encounter [S00.83XA] Fall, initial encounter [W19.XXXA] Eyebrow laceration, left, initial encounter [X83.382N] Closed fracture of left elbow, initial encounter [S42.402A] Closed fracture of facial bone due to fall,  initial encounter ([place]) [S02.92XA, [id number]] Epistaxis due to trauma [R04.0] Patient Active Problem List   Diagnosis Date Noted   Closed fracture of left olecranon process 02/15/2022   Hypertensive urgency 02/15/2022   Fall from ground level 02/15/2022   Syncope and collapse 02/15/2022   Closed fracture of face bones due to fall ([place]) 02/15/2022   Contusion of face 02/15/2022   Renal artery stenosis ([place]), right - monitored by vascular 01/05/2022   B12 deficiency 10/30/2021   Anxiety and depression 10/02/2021   Insomnia 04/23/2021   Paresthesias in right hand 04/23/2021   Pain in joint of left knee 12/01/2020   Osteoporosis 11/23/2019   Low vitamin B12 level 11/23/2019   CKD (chronic kidney disease) stage 3, GFR 30-59 ml/min (HCC) 11/22/2019   Hyperglycemia 11/22/2019   Degenerative arthritis of knee, bilateral 04/04/2017   Chronic idiopathic constipation 02/18/2017   Osteoarthritis 12/03/2016   Tinnitus of both ears 12/03/2016   Acquired hypothyroidism 01/28/2016   Primary osteoarthritis involving multiple joints 01/28/2016   Overactive bladder 01/26/2016   Premature atrial contractions 07/03/2010   Atrial fibrillation ([place]) 06/05/2010   Essential hypertension 09/03/2008   History of atrial fibrillation 09/03/2008   ARTHROSCOPY, KNEE, HX OF 09/03/2008   PCP:  Binnie Rail, MD Pharmacy:   [id number] [place] [place]Phone: [phone]: [phone]    Social Determinants of Health (SDOH) Social History: SDOH Screenings   Food Insecurity: No Food Insecurity (02/15/2022)  Housing: Low Risk  (02/15/2022)  Transportation Needs: No Transportation  Needs (02/15/2022)  Utilities: Not At Risk (02/15/2022)  Alcohol Screen: Low Risk  (06/26/2021)  Depression (PHQ2-9): Low Risk  (10/30/2021)  Financial Resource Strain: Low Risk  (06/26/2021)  Physical Activity: Sufficiently Active  (06/26/2021)  Social Connections: Socially Integrated (06/26/2021)  Stress: No Stress Concern Present (06/26/2021)  Tobacco Use: Low Risk  (02/15/2022)   SDOH Interventions: Housing Interventions: Intervention Not Indicated   Readmission Risk Interventions     No data to display

## 2022-02-18 ENCOUNTER — Encounter: Payer: Self-pay | Admitting: Internal Medicine

## 2022-02-18 DIAGNOSIS — S0285XA Fracture of orbit, unspecified, initial encounter for closed fracture: Secondary | ICD-10-CM | POA: Insufficient documentation

## 2022-02-18 NOTE — Progress Notes (Unsigned)
Subjective:    Patient ID: Amy [name], female    DOB: 1935/06/06, 87 y.o.   MRN: [id number]     HPI [name] is here for follow up from the hospital.   She is here today with her husband and daughter.   Admitted 02/14/22 - 02/16/22  Presented to ED with elevated BP. Her BP that evening at 5:30 was >200/?Marland Kitchen  She took a dose of clonidine.  BP continued to stay elevated.  She was asymptomatic - no chest pain, palps, SOB. HA, blurry fision.  Amlodipine was being weaned down by cardio for ankle swelling and losartan was changed to olmesartan.  This occurred in the past 3 weeks.   In ED BP 185/70.  Troponin x 1 neg.  Labs at baseline.   Treated with clonidine 0.'4mg'$  and IV NS.  was transitioned to nicardipine drip.  Was discharged home and while leaving the ED fell on her face.  Fall vx syncope.  Sustained fractures -  Ct w/o acute abn.  Ct c-spine w/o acute abn.  Ct maxillofacial - nondisplace fx on left orbital floor.  Left elbow xray acute comminuted intra-articular olecranon fx.  Suture of left eyebrow laceration provided.  Ortho consulted and recommended splinting and follow up of left olecranon fx.  Placed on telemetry for syncope.  Cardiology consulted - may need outpt ziopatch.  Echo- EF 65-70%, no WMA, G2DD, mold-mod TR, EEG neg for seizure,  was in sinus on telemetry, EKG stable  Htn - weaned off cardene drip - placed back on home med - BP was well controlled.    Maxillary and orbital wall fractures - discussed with ENT - Dr Marcelline Deist.  No surgery needed.  Hold xarelto - restart 02/17/22.   Renal artery stenosis - s/p stent 01/2021.  Renal artery duplex 10/23 - > 60% right renal artery stenosis, no left renal artery stenosis. Follows with vascular  Left olecranon fx - discussed with ORtho - Dr Aline Brochure.  Splinted, can f/u in clinic  Sees ENT tomorrow, sees orthopedic tomorrow Pain controlled with tylenol.    Some darker blood with blowing nose or coughing up mucus. No bright red  blood.     Has PT ordered.  Will have OT coming.    BP lower in am and gets higher in the evening.    Medications and allergies reviewed with patient and updated if appropriate.  Current Outpatient Medications on File Prior to Visit  Medication Sig Dispense Refill   acetaminophen (TYLENOL) 650 MG CR tablet Take 1,300 mg by mouth 2 (two) times daily as needed for pain.     ALPRAZolam (XANAX) 0.25 MG tablet Take 1 tablet (0.25 mg total) by mouth at bedtime as needed for anxiety. 4 tablet 0   AMBULATORY NON FORMULARY MEDICATION Take 1 drop by mouth daily as needed (pain). CBD Oil 1 dose under tongue once daily as needed for pain     amLODipine (NORVASC) 5 MG tablet Take 1 tablet (5 mg total) by mouth daily. 180 tablet 3   amoxicillin (AMOXIL) 500 MG capsule Take 500 mg by mouth 3 (three) times daily as needed (For gum flare up).     atenolol (TENORMIN) 50 MG tablet Take 1 tablet (50 mg total) by mouth daily. 90 tablet 3   Calcium Carbonate-Vitamin D (CALCIUM + D PO) Take 1 tablet by mouth daily.     cloNIDine (CATAPRES) 0.1 MG tablet Take 1 tablet (0.1 mg total) by mouth 3 (three)  times daily. 270 tablet 3   dorzolamide-timolol (COSOPT) 22.3-6.8 MG/ML ophthalmic solution Place 1 drop into both eyes 2 (two) times daily.     flecainide (TAMBOCOR) 50 MG tablet TAKE 1 TABLET BY MOUTH TWICE A DAY (Patient taking differently: Take 50 mg by mouth 2 (two) times daily.) 180 tablet 3   hydrALAZINE (APRESOLINE) 100 MG tablet TAKE 1 TABLET BY MOUTH 3 TIMES DAILY. 270 tablet 3   levothyroxine (SYNTHROID) 50 MCG tablet TAKE 1 TABLET BY MOUTH EVERY DAY (Patient taking differently: Take 50 mcg by mouth daily before breakfast.) 90 tablet 3   linaclotide (LINZESS) 72 MCG capsule Take 72 mcg by mouth as needed (for constipation).     olmesartan (BENICAR) 40 MG tablet Take 1 tablet (40 mg total) by mouth daily. 30 tablet 11   traMADol (ULTRAM) 50 MG tablet Take 1 tablet (50 mg total) by mouth every 6 (six) hours  as needed for moderate pain. 12 tablet 0   XARELTO 15 MG TABS tablet TAKE 1 TABLET BY MOUTH EVERY DAY WITH SUPPER (Patient taking differently: Take 15 mg by mouth every other day.) 30 tablet 5   No current facility-administered medications on file prior to visit.     Review of Systems  Constitutional:  Negative for chills and fever.  Eyes:  Negative for visual disturbance.  Respiratory:  Negative for cough, shortness of breath and wheezing.   Cardiovascular:  Negative for chest pain, palpitations and leg swelling.  Gastrointestinal:  Negative for nausea.  Neurological:  Negative for dizziness, light-headedness and headaches.       Objective:   Vitals:   02/19/22 0900  BP: (!) 120/56  Pulse: 68  Temp: 98.1 F (36.7 C)  SpO2: 96%   BP Readings from Last 3 Encounters:  02/19/22 (!) 120/56  02/16/22 (!) 106/52  01/24/22 (!) 150/62   Wt Readings from Last 3 Encounters:  02/19/22 125 lb (56.7 kg)  02/15/22 124 lb 5.4 oz (56.4 kg)  01/24/22 125 lb (56.7 kg)   Body mass index is 19.58 kg/m.    Physical Exam Constitutional:      General: She is not in acute distress.    Appearance: Normal appearance.  HENT:     Head: Normocephalic.  Eyes:     Conjunctiva/sclera: Conjunctivae normal.  Cardiovascular:     Rate and Rhythm: Normal rate and regular rhythm.     Heart sounds: Normal heart sounds. No murmur heard. Pulmonary:     Effort: Pulmonary effort is normal. No respiratory distress.     Breath sounds: Normal breath sounds. No wheezing.  Musculoskeletal:        General: Swelling (left hand form splint) present.     Cervical back: Neck supple.     Right lower leg: No edema.     Left lower leg: No edema.  Lymphadenopathy:     Cervical: No cervical adenopathy.  Skin:    General: Skin is warm and dry.     Findings: Bruising (left orbit bruising extending to neck, grape sized hematoma left lateral eyebrow) present. No rash.  Neurological:     Mental Status: She is  alert. Mental status is at baseline.     Sensory: No sensory deficit.  Psychiatric:        Mood and Affect: Mood normal.        Behavior: Behavior normal.        Lab Results  Component Value Date   WBC 7.9 02/16/2022   HGB 9.0 (L)  02/16/2022   HCT 28.4 (L) 02/16/2022   PLT 206 02/16/2022   GLUCOSE 96 02/16/2022   CHOL 219 (H) 10/12/2020   TRIG 85 10/12/2020   HDL 95 10/12/2020   LDLDIRECT 84.9 11/15/2008   LDLCALC 109 (H) 10/12/2020   ALT 14 02/15/2022   AST 18 02/15/2022   NA 136 02/16/2022   K 4.1 02/16/2022   CL 106 02/16/2022   CREATININE 1.29 (H) 02/16/2022   BUN 20 02/16/2022   CO2 19 (L) 02/16/2022   TSH 2.962 02/15/2022   INR 1.4 (H) 03/28/2011     Assessment & Plan:    See Problem List for Assessment and Plan of chronic medical problems.     I spent 36 minutes dedicated to the care of this patient on the date of this encounter including review of recent labs, imaging and procedures done at the hospital, speciality notes, obtaining history, communicating with the patient, hur husband and daughter and documenting clinical information in the EHR

## 2022-02-19 ENCOUNTER — Ambulatory Visit: Payer: Medicare PPO | Admitting: Internal Medicine

## 2022-02-19 VITALS — BP 120/56 | HR 68 | Temp 98.1°F | Ht 67.0 in | Wt 125.0 lb

## 2022-02-19 DIAGNOSIS — F419 Anxiety disorder, unspecified: Secondary | ICD-10-CM

## 2022-02-19 DIAGNOSIS — S52022D Displaced fracture of olecranon process without intraarticular extension of left ulna, subsequent encounter for closed fracture with routine healing: Secondary | ICD-10-CM | POA: Diagnosis not present

## 2022-02-19 DIAGNOSIS — F32A Depression, unspecified: Secondary | ICD-10-CM

## 2022-02-19 DIAGNOSIS — S0285XA Fracture of orbit, unspecified, initial encounter for closed fracture: Secondary | ICD-10-CM | POA: Diagnosis not present

## 2022-02-19 DIAGNOSIS — S02401D Maxillary fracture, unspecified, subsequent encounter for fracture with routine healing: Secondary | ICD-10-CM | POA: Diagnosis not present

## 2022-02-19 DIAGNOSIS — D649 Anemia, unspecified: Secondary | ICD-10-CM | POA: Diagnosis not present

## 2022-02-19 DIAGNOSIS — I480 Paroxysmal atrial fibrillation: Secondary | ICD-10-CM | POA: Diagnosis not present

## 2022-02-19 DIAGNOSIS — I10 Essential (primary) hypertension: Secondary | ICD-10-CM | POA: Diagnosis not present

## 2022-02-19 DIAGNOSIS — S52022A Displaced fracture of olecranon process without intraarticular extension of left ulna, initial encounter for closed fracture: Secondary | ICD-10-CM | POA: Diagnosis not present

## 2022-02-19 DIAGNOSIS — I4892 Unspecified atrial flutter: Secondary | ICD-10-CM | POA: Diagnosis not present

## 2022-02-19 DIAGNOSIS — I701 Atherosclerosis of renal artery: Secondary | ICD-10-CM | POA: Diagnosis not present

## 2022-02-19 DIAGNOSIS — R002 Palpitations: Secondary | ICD-10-CM | POA: Diagnosis not present

## 2022-02-19 DIAGNOSIS — S0232XD Fracture of orbital floor, left side, subsequent encounter for fracture with routine healing: Secondary | ICD-10-CM | POA: Diagnosis not present

## 2022-02-19 DIAGNOSIS — R55 Syncope and collapse: Secondary | ICD-10-CM | POA: Diagnosis not present

## 2022-02-19 NOTE — Assessment & Plan Note (Signed)
Acute Related to fall in ED Pain is minimal Taking tylenol as needed No vision concerns, teeth to hurt Has some dark blood at times with coughing up mucus or from nose Not signs of active bleed No need to stop [facility] ENT tomorrow

## 2022-02-19 NOTE — Assessment & Plan Note (Signed)
Acute  Resulted in fall in ED No prodromal symptoms evaluation in hospital neg EEG nml, Ct head neg for acute bleed, telemetry wnl, Echo unchanged/stable May need outpatient holter To f/u with cardiology Will take fall precautions Discussed syncope can occur again  Keep well hydrated Monitor BP

## 2022-02-19 NOTE — Assessment & Plan Note (Signed)
Chronic Has low level of anxiety and depression Has difficulty sleeping because of this Asked about sleep medication - would prefer to try medication for anxiety first to see if that helps Will start a low dose SSRI - will get advice from Dr Harrington Challenger on best medication given flecainide Discussed ways to help anxiety

## 2022-02-19 NOTE — Patient Instructions (Addendum)
      Medications changes include :   none      Return for follow up as scheduled.  

## 2022-02-19 NOTE — Assessment & Plan Note (Signed)
Chronic BP well controlled here today - denies lightheadedness She states BP gets higher in evening - 150's SBP Cardiology has been monitoring and will f/u with Dr Harrington Challenger Will continue to monitor BP at home and bring log into Dr Harrington Challenger Continue amlodipine 5 mg daily, atenolol 50 mg daily, clonidine 0.1 mg TID, hydralazine 100 mg TID, olmesartan 40 mg

## 2022-02-19 NOTE — Assessment & Plan Note (Signed)
Acute Secondary to fall in ED  Ortho advised splint  - has f/u with them tomorrow Pain controlled with tylenol - continue Had is swollen - this is to be expected - will have ortho advise how to help this tomorrow Will start with home PT, OT this week

## 2022-02-20 ENCOUNTER — Ambulatory Visit: Payer: Medicare PPO | Admitting: Orthopedic Surgery

## 2022-02-20 ENCOUNTER — Encounter: Payer: Self-pay | Admitting: Orthopedic Surgery

## 2022-02-20 VITALS — BP 111/46 | HR 54 | Ht 67.0 in | Wt 125.0 lb

## 2022-02-20 DIAGNOSIS — Z7901 Long term (current) use of anticoagulants: Secondary | ICD-10-CM | POA: Insufficient documentation

## 2022-02-20 DIAGNOSIS — S0181XA Laceration without foreign body of other part of head, initial encounter: Secondary | ICD-10-CM | POA: Insufficient documentation

## 2022-02-20 DIAGNOSIS — S0285XA Fracture of orbit, unspecified, initial encounter for closed fracture: Secondary | ICD-10-CM | POA: Diagnosis not present

## 2022-02-20 DIAGNOSIS — S0240DA Maxillary fracture, left side, initial encounter for closed fracture: Secondary | ICD-10-CM | POA: Diagnosis not present

## 2022-02-20 DIAGNOSIS — S52022A Displaced fracture of olecranon process without intraarticular extension of left ulna, initial encounter for closed fracture: Secondary | ICD-10-CM

## 2022-02-20 NOTE — Progress Notes (Signed)
Emergency room follow-up  Chief complaint left elbow pain  History 87 year old female history of hypertension, hypothyroidism, atrial fibrillation, atrial flutter/tachycardia post ablation presenting with elevated blood pressure to the emergency room while in the emergency room ready for discharge she walked out the door had a syncopal episode and fell and fractured her left elbow.  She was admitted to the hospital and worked up for syncope.  It is unclear from discharge summary what the final outcome of the syncopal episode was it looks like she was seen by cardiology she has a paroxysmal atrial fib she is on an anticoagulant  She presents now for evaluation of her fracture  Past Medical History:  Diagnosis Date   Anemia    Arthritis    Atrial fibrillation (HCC)    Atrial flutter ([place])    s/p ablation   Atypical mole 09/07/2013   LOWER LEG SEVERE TX= WIDER SHAVE    HTN (hypertension)    Hypothyroidism    Nodular basal cell carcinoma (BCC) 03/29/2020   Right [place]ek   Renal artery stenosis [facility])     Past Surgical History:  Procedure Laterality Date   A FLUTTER ABLATION     KNEE ARTHROSCOPY Right    PERIPHERAL VASCULAR INTERVENTION Right 02/13/2021   Procedure: PERIPHERAL VASCULAR INTERVENTION;  Surgeon: Serafina Mitchell, MD;  Location: [place] CV [facility];  Service: Cardiovascular;  Laterality: Right;   RENAL ANGIOGRAPHY N/A 02/13/2021   Procedure: RENAL ANGIOGRAPHY;  Surgeon: Serafina Mitchell, MD;  Location: [place] [facility];  Service: Cardiovascular;  Laterality: N/A;   TUBAL LIGATION        LMP  (LMP Unknown)   Physical Exam Constitutional:      General: She is not in acute distress.    Appearance: She is normal weight. She is not ill-appearing, toxic-appearing or diaphoretic.  HENT:     Head: Contusion present.  Eyes:     General: No scleral icterus.       Right eye: No discharge.        Left eye: No discharge.     Pupils: Pupils are equal, round, and reactive  to light.  Musculoskeletal:     Comments: There is a bruise over the elbow  Skin:    General: Skin is warm.     Capillary Refill: Capillary refill takes less than 2 seconds.     Findings: Bruising present.  Neurological:     General: No focal deficit present.     Mental Status: She is alert and oriented to person, place, and time.     Sensory: No sensory deficit.     Motor: No weakness.  Psychiatric:        Mood and Affect: Mood normal.        Behavior: Behavior normal.        Thought Content: Thought content normal.        Judgment: Judgment normal.    Examination of the left elbow there is swelling in the hand mainly from the way the splint was applied  There is tenderness at the fracture site humerus forearm nontender muscle tone normal no tremor    Encounter Diagnosis  Name Primary?   Olecranon fracture, left, closed, initial encounter Yes   [phone]   All emergency room records and discharge summary was reviewed   The imaging from the hospital I interpret as a comminuted fracture intra-articular left olecranon with minimal displacement and angulation  This is amenable to casting especially in the  current situation of recent syncope and chronic heart arrhythmia  Long-arm cast applied  Cast 3 weeks total recommend x-ray and 5 February at the interval to 10 days

## 2022-02-21 ENCOUNTER — Telehealth: Payer: Self-pay | Admitting: Radiology

## 2022-02-21 NOTE — Telephone Encounter (Signed)
Daughter Ova Freshwater called LM asking for a call due to a discrpeancy between [facility] ED note and our note.  Please all her at [phone], Levada Dy is on the [facility].

## 2022-02-22 DIAGNOSIS — S02401D Maxillary fracture, unspecified, subsequent encounter for fracture with routine healing: Secondary | ICD-10-CM | POA: Diagnosis not present

## 2022-02-22 DIAGNOSIS — I4892 Unspecified atrial flutter: Secondary | ICD-10-CM | POA: Diagnosis not present

## 2022-02-22 DIAGNOSIS — R002 Palpitations: Secondary | ICD-10-CM | POA: Diagnosis not present

## 2022-02-22 DIAGNOSIS — S0232XD Fracture of orbital floor, left side, subsequent encounter for fracture with routine healing: Secondary | ICD-10-CM | POA: Diagnosis not present

## 2022-02-22 DIAGNOSIS — D649 Anemia, unspecified: Secondary | ICD-10-CM | POA: Diagnosis not present

## 2022-02-22 DIAGNOSIS — S52022D Displaced fracture of olecranon process without intraarticular extension of left ulna, subsequent encounter for closed fracture with routine healing: Secondary | ICD-10-CM | POA: Diagnosis not present

## 2022-02-22 DIAGNOSIS — I10 Essential (primary) hypertension: Secondary | ICD-10-CM | POA: Diagnosis not present

## 2022-02-22 DIAGNOSIS — I480 Paroxysmal atrial fibrillation: Secondary | ICD-10-CM | POA: Diagnosis not present

## 2022-02-22 DIAGNOSIS — I701 Atherosclerosis of renal artery: Secondary | ICD-10-CM | POA: Diagnosis not present

## 2022-02-25 ENCOUNTER — Ambulatory Visit (INDEPENDENT_AMBULATORY_CARE_PROVIDER_SITE_OTHER): Payer: Medicare PPO

## 2022-02-25 ENCOUNTER — Ambulatory Visit (INDEPENDENT_AMBULATORY_CARE_PROVIDER_SITE_OTHER): Payer: Medicare PPO | Admitting: Orthopedic Surgery

## 2022-02-25 DIAGNOSIS — S52022D Displaced fracture of olecranon process without intraarticular extension of left ulna, subsequent encounter for closed fracture with routine healing: Secondary | ICD-10-CM

## 2022-02-25 NOTE — Progress Notes (Signed)
Follow-up x-ray left elbow in cast for olecranon fracture   Chief Complaint  Patient presents with   Elbow Injury    INJ JAN 25     Encounter Diagnosis  Name Primary?   Olecranon fracture, left, closed, with routine healing, subsequent encounter 02/14/22 Yes   Injury date January 25 so this is now 11 days post injury   Today's x-ray shows a minimally displaced olecranon fracture stable in the cast   Total cast time expected to be 21 days show x-ray in 10 to 12 days should be fine  Follow-up February 16 at 9:45 AM

## 2022-02-26 DIAGNOSIS — S0285XA Fracture of orbit, unspecified, initial encounter for closed fracture: Secondary | ICD-10-CM | POA: Diagnosis not present

## 2022-02-26 DIAGNOSIS — S52022D Displaced fracture of olecranon process without intraarticular extension of left ulna, subsequent encounter for closed fracture with routine healing: Secondary | ICD-10-CM | POA: Diagnosis not present

## 2022-02-26 DIAGNOSIS — I701 Atherosclerosis of renal artery: Secondary | ICD-10-CM | POA: Diagnosis not present

## 2022-02-26 DIAGNOSIS — I4892 Unspecified atrial flutter: Secondary | ICD-10-CM | POA: Diagnosis not present

## 2022-02-26 DIAGNOSIS — S0240DA Maxillary fracture, left side, initial encounter for closed fracture: Secondary | ICD-10-CM | POA: Diagnosis not present

## 2022-02-26 DIAGNOSIS — I480 Paroxysmal atrial fibrillation: Secondary | ICD-10-CM | POA: Diagnosis not present

## 2022-02-26 DIAGNOSIS — S0181XA Laceration without foreign body of other part of head, initial encounter: Secondary | ICD-10-CM | POA: Diagnosis not present

## 2022-02-26 DIAGNOSIS — R002 Palpitations: Secondary | ICD-10-CM | POA: Diagnosis not present

## 2022-02-26 DIAGNOSIS — D649 Anemia, unspecified: Secondary | ICD-10-CM | POA: Diagnosis not present

## 2022-02-26 DIAGNOSIS — S02401D Maxillary fracture, unspecified, subsequent encounter for fracture with routine healing: Secondary | ICD-10-CM | POA: Diagnosis not present

## 2022-02-26 DIAGNOSIS — S0232XD Fracture of orbital floor, left side, subsequent encounter for fracture with routine healing: Secondary | ICD-10-CM | POA: Diagnosis not present

## 2022-02-26 DIAGNOSIS — I10 Essential (primary) hypertension: Secondary | ICD-10-CM | POA: Diagnosis not present

## 2022-02-27 DIAGNOSIS — I10 Essential (primary) hypertension: Secondary | ICD-10-CM | POA: Diagnosis not present

## 2022-02-27 DIAGNOSIS — I480 Paroxysmal atrial fibrillation: Secondary | ICD-10-CM | POA: Diagnosis not present

## 2022-02-27 DIAGNOSIS — S0232XD Fracture of orbital floor, left side, subsequent encounter for fracture with routine healing: Secondary | ICD-10-CM | POA: Diagnosis not present

## 2022-02-27 DIAGNOSIS — I4892 Unspecified atrial flutter: Secondary | ICD-10-CM | POA: Diagnosis not present

## 2022-02-27 DIAGNOSIS — D649 Anemia, unspecified: Secondary | ICD-10-CM | POA: Diagnosis not present

## 2022-02-27 DIAGNOSIS — S52022D Displaced fracture of olecranon process without intraarticular extension of left ulna, subsequent encounter for closed fracture with routine healing: Secondary | ICD-10-CM | POA: Diagnosis not present

## 2022-02-27 DIAGNOSIS — R002 Palpitations: Secondary | ICD-10-CM | POA: Diagnosis not present

## 2022-02-27 DIAGNOSIS — S02401D Maxillary fracture, unspecified, subsequent encounter for fracture with routine healing: Secondary | ICD-10-CM | POA: Diagnosis not present

## 2022-02-27 DIAGNOSIS — I701 Atherosclerosis of renal artery: Secondary | ICD-10-CM | POA: Diagnosis not present

## 2022-02-27 NOTE — Progress Notes (Signed)
Office Visit    Patient Name: Amy [name] Date of Encounter: 02/28/2022  PCP:  Binnie Rail, MD   [place]  Cardiologist:  Dorris Carnes, MD  Advanced Practice Provider:  No care team member to display Electrophysiologist:  None   HPI    Amy [name] is a 87 y.o. female with a past medical history significant for uncontrolled hypertension, hypothyroidism, chronic renal disease, paroxysmal atrial fibrillation, renal artery stenosis presents today for hospital follow-up.  She presented to the ED for evaluation of blood pressure.  She checks her blood pressure normally 3 times daily and when she checked it the evening of 11/07/2021 it was above 950 systolic.  Otherwise she was asymptomatic.  She denied headache, chest pain, or difficulty breathing.  She did take her nighttime blood pressure medication shortly after arriving at the ED.  She was given an additional dose of clonidine with good results.  Blood pressure was initially 215/72 and came down to 932 systolic.  She was then discharged and set up for close follow-up today.  She was seen by me 10/23 and had uncontrolled BP. Today, it was 172/82.  She states at home when she is checked it has been anywhere from 671I systolic to 458 systolic.  She was recently in the ER and she was given an extra clonidine with good results.  We discussed titration of her medications today.  We also discussed repeating a renal ultrasound to look at her right renal stent to see if maybe she has some in-stent stenosis.  Her hypertensive regimen currently consists of amlodipine 2.5 mg twice daily, atenolol 50 mg daily, clonidine 0.1 mg 3 times daily, hydralazine 100 mg 3 times daily, Cozaar 50 mg twice daily.  She tells me when her blood pressure gets high her face and ears get red and she knows to check her blood pressure at this time.  She had an appointment with Christen Bame on 01/24/22 and at that time she was evaluated for  bilateral ankle swelling and redness times approximately 1 month.  Swelling and redness improved with leg elevation but returned to soon as she was back on her feet.  Reported symptoms started when she increased amlodipine to 10 mg daily.  SBP typically 120 to 150 mmHg at home higher in the afternoons and evenings.  Symptomatic with elevated BPs.  Remains active at home.  Her amlodipine was decreased from 10 mg to 5 mg.  Switch losartan to olmesartan 40 mg daily.  Asked to continue monitoring blood pressure.  She was seen in the ED 02/14/22 for syncope and collapse.  She collapsed suddenly and lost consciousness.  Her blood pressure was over 200 at that time.  We discussed taking an extra clonidine for blood pressure over 099 systolic and if she is needing to take several of these a week we can increase her dose.  She had bleeding from her eyelid and nose and needed a few stitches above her left eye from the fall.  No symptoms before the fall.  She denied chest pain and shortness of breath.  No lightheadedness/dizziness.  No presyncope.  No palpitations.  Even though she has extensive bruising luckily, she is not in much pain.  She does take Tylenol for arthritis.  This is the first time she is ever passed out and lost consciousness.  Labs reviewed and her hemoglobin was 9.  Likely due to the bleeding she experienced from the fall.  We  will recheck a CBC today.   Reports no shortness of breath nor dyspnea on exertion. Reports no chest pain, pressure, or tightness. No edema, orthopnea, PND. Reports no palpitations.   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;  Service: Cardiovascular;  Laterality: Right;   RENAL ANGIOGRAPHY N/A 02/13/2021   Procedure: RENAL ANGIOGRAPHY;  Surgeon: Serafina Mitchell, MD;  Location: [facility];  Service: Cardiovascular;  Laterality: N/A;   TUBAL LIGATION      Allergies  No Known Allergies  EKGs/Labs/Other Studies Reviewed:   The following studies were reviewed today:  Echo 02/15/22   IMPRESSIONS     1. Left ventricular ejection fraction, by estimation, is 65 to 70%. The  left ventricle has normal function. The left ventricle has no regional  wall motion abnormalities. Left ventricular diastolic parameters are  consistent with Grade II diastolic  dysfunction (pseudonormalization).   2. Right ventricular systolic function is normal. The right ventricular  size is normal. There is moderately elevated pulmonary artery systolic  pressure. The estimated right ventricular systolic pressure is 78.2 mmHg.   3. Left atrial size was moderately dilated.   4. A small pericardial effusion is present. The pericardial effusion is  posterior to the left ventricle.   5. The mitral valve is degenerative. Mild mitral valve regurgitation.   6. Tricuspid valve regurgitation is mild to moderate.   7. The aortic valve is tricuspid. There is mild calcification of the  aortic valve. Aortic valve regurgitation is trivial. Aortic valve  sclerosis/calcification is present, without any evidence of aortic  stenosis.   8. The inferior vena cava is normal in size with greater than 50%  respiratory variability, suggesting right atrial pressure of 3 mmHg.   Comparison(s): No prior Echocardiogram.   FINDINGS   Left Ventricle: Left ventricular ejection fraction, by estimation, is 65  to 70%.  The left ventricle has normal function. The left ventricle has no  regional wall motion abnormalities. The left ventricular internal cavity  size was normal in size. There is   borderline concentric left ventricular hypertrophy. Left ventricular  diastolic parameters are consistent with Grade II diastolic dysfunction  (pseudonormalization).   Right Ventricle: The right ventricular size is normal. No increase in  right ventricular wall thickness. Right ventricular systolic function is  normal. There is moderately elevated pulmonary artery systolic pressure.  The tricuspid regurgitant velocity is  3.57 m/s, and with an assumed right atrial pressure of 3 mmHg, the  estimated right ventricular systolic pressure is 95.6 mmHg.   Left Atrium: Left atrial size was moderately dilated.   Right Atrium: Right atrial size was normal in size.   Pericardium: A small pericardial effusion is present. The pericardial  effusion is posterior to the left ventricle. Presence of epicardial fat  layer.  Mitral Valve: The mitral valve is degenerative in appearance. There is  mild thickening of the mitral valve leaflet(s). Mild mitral annular  calcification. Mild mitral valve regurgitation.   Tricuspid Valve: The tricuspid valve is grossly normal. Tricuspid valve  regurgitation is mild to moderate.   Aortic Valve: The aortic valve is tricuspid. There is mild calcification  of the aortic valve. There is mild to moderate aortic valve annular  calcification. Aortic valve regurgitation is trivial. Aortic valve  sclerosis/calcification is present, without  any evidence of aortic stenosis.   Pulmonic Valve: The pulmonic valve was grossly normal. Pulmonic valve  regurgitation is trivial.   Aorta: The aortic root is normal in size and structure.   Venous: The inferior vena cava is normal in size with greater than 50%  respiratory variability, suggesting right atrial pressure of 3 mmHg.   IAS/Shunts: No  atrial level shunt detected by color flow Doppler.   Vas US renal arteries 2/23  Summary:  Renal:     Right: RRV flow present. Technically difficult. Proximal and mid         right renal artery/stent could not be visualized. No evidence         of right renal artery stenosis in the distal renal artery.  Left:  No evidence of left renal artery stenosis. LRV flow present.     *See table(s) above for measurements and observations.     Diagnosing physician: Harold Barban MD   EKG:  EKG is not ordered today.   Recent Labs: 03/05/2021: NT-Pro BNP 623 02/15/2022: ALT 14; Magnesium 1.6; TSH 2.962 02/16/2022: BUN 20; Creatinine, Ser 1.29; Hemoglobin 9.0; Platelets 206; Potassium 4.1; Sodium 136  Recent Lipid Panel    Component Value Date/Time   CHOL 219 (H) 10/12/2020 1617   TRIG 85 10/12/2020 1617   HDL 95 10/12/2020 1617   CHOLHDL 2.3 10/12/2020 1617   CHOLHDL 2.0 06/05/2015 0946   VLDL 10 06/05/2015 0946   LDLCALC 109 (H) 10/12/2020 1617   LDLDIRECT 84.9 11/15/2008 0924    Risk Assessment/Calculations:   CHA2DS2-VASc Score = 4   This indicates a 4.8% annual risk of stroke. The patient's score is based upon: CHF History: 0 HTN History: 1 Diabetes History: 0 Stroke History: 0 Vascular Disease History: 0 Age Score: 2 Gender Score: 1     Home Medications   Current Meds  Medication Sig   acetaminophen (TYLENOL) 650 MG CR tablet Take 1,300 mg by mouth 2 (two) times daily as needed for pain.   AMBULATORY NON FORMULARY MEDICATION Take 1 drop by mouth daily as needed (pain). CBD Oil 1 dose under tongue once daily as needed for pain   amLODipine (NORVASC) 5 MG tablet Take 1 tablet (5 mg total) by mouth daily.   amoxicillin (AMOXIL) 500 MG capsule Take 500 mg by mouth 3 (three) times daily as needed (For gum flare up).   atenolol (TENORMIN) 50 MG tablet Take 1 tablet (50 mg total) by mouth daily.   Calcium Carbonate-Vitamin D (CALCIUM + D PO) Take 1 tablet by mouth daily.    cloNIDine (CATAPRES) 0.1 MG tablet Take 1 tablet (0.1 mg total) by mouth 3 (three) times daily.   dorzolamide-timolol (COSOPT) 22.3-6.8 MG/ML ophthalmic solution Place 1 drop into both eyes 2 (two) times daily.   flecainide (TAMBOCOR) 50 MG tablet TAKE 1 TABLET BY MOUTH TWICE A DAY (Patient taking differently: Take 50 mg by mouth 2 (two) times daily.)   hydrALAZINE (APRESOLINE) 100 MG tablet TAKE  1 TABLET BY MOUTH 3 TIMES DAILY.   levothyroxine (SYNTHROID) 50 MCG tablet TAKE 1 TABLET BY MOUTH EVERY DAY (Patient taking differently: Take 50 mcg by mouth daily before breakfast.)   linaclotide (LINZESS) 72 MCG capsule Take 72 mcg by mouth as needed (for constipation).   olmesartan (BENICAR) 40 MG tablet Take 1 tablet (40 mg total) by mouth daily.   XARELTO 15 MG TABS tablet TAKE 1 TABLET BY MOUTH EVERY DAY WITH SUPPER (Patient taking differently: Take 15 mg by mouth every other day.)     Review of Systems      All other systems reviewed and are otherwise negative except as noted above.  Physical Exam    VS:  BP (!) 126/48   Pulse (!) 50   Ht '5\' 7"'$  (1.702 m)   Wt 123 lb 12.8 oz (56.2 kg)   LMP  (LMP Unknown)   SpO2 98%   BMI 19.39 kg/m  , BMI Body mass index is 19.39 kg/m.  Wt Readings from Last 3 Encounters:  02/28/22 123 lb 12.8 oz (56.2 kg)  02/20/22 125 lb (56.7 kg)  02/19/22 125 lb (56.7 kg)     GEN: Well nourished, well developed, in no acute distress. HEENT: normal. Neck: Supple, no JVD, carotid bruits, or masses. Cardiac: RRR, no murmurs, rubs, or gallops. No clubbing, cyanosis, edema.  Radials/PT 2+ and equal bilaterally.  Respiratory:  Respirations regular and unlabored, clear to auscultation bilaterally. GI: Soft, nontender, nondistended. MS: No deformity or atrophy. Skin: Warm and dry, no rash. Neuro:  Strength and sensation are intact. Psych: Normal affect.  Assessment & Plan    Syncope and collapse -will order carotid US to r/o carotid artery stenosis -maintain  hydration -zio monitor today -echo reviewed without culprit for syncope -labs reviewed, update CBC today -if cardiac w/o negative would look for neurologic source -monitor BP closely since she was hypertensive at the time of her fall  Uncontrolled HTN -She is on a multidrug regimen including Norvasc '5mg'$  daily, atenolol 50 mg daily, clonidine 0.1 mg 3 times daily, hydralazine 100 mg 3 times daily, Benicar '40mg'$  daily -When she presented to the ED blood pressure was > 716 systolic -> 967 on the top number can take extra Clonidine  0.1 mg, if she is needing an extra dose often, we can increase her dose -continue to monitor close at home  Renal artery stenosis -Probable occlusion of the right renal artery on ultrasound 12/22.  Angiogram was recommended. Follow-up renal artery ultrasound 2/23 showed no evidence of right renal stenosis and no evidence of left renal stenosis -renal artery stent placement- 2021  Paroxysmal atrial fibrillation -no issues, since ablation 5 or 6 years ago -continue xarelto '15mg'$  daily (creatinine clearance 1.29)   Chronic renal insufficiency -recent BMP 10/19 so will cancel BMP scheduled for 11/2        Disposition: Follow up 3 months with Dorris Carnes, MD or APP.  Signed, Elgie Collard, PA-C 02/28/2022, 12:18 PM [facility]

## 2022-02-28 ENCOUNTER — Ambulatory Visit (INDEPENDENT_AMBULATORY_CARE_PROVIDER_SITE_OTHER): Payer: Medicare PPO

## 2022-02-28 ENCOUNTER — Ambulatory Visit: Payer: Medicare PPO | Attending: Physician Assistant | Admitting: Physician Assistant

## 2022-02-28 ENCOUNTER — Encounter: Payer: Self-pay | Admitting: Physician Assistant

## 2022-02-28 VITALS — BP 126/48 | HR 50 | Ht 67.0 in | Wt 123.8 lb

## 2022-02-28 DIAGNOSIS — I701 Atherosclerosis of renal artery: Secondary | ICD-10-CM

## 2022-02-28 DIAGNOSIS — R55 Syncope and collapse: Secondary | ICD-10-CM | POA: Diagnosis not present

## 2022-02-28 DIAGNOSIS — I480 Paroxysmal atrial fibrillation: Secondary | ICD-10-CM | POA: Diagnosis not present

## 2022-02-28 DIAGNOSIS — S52022D Displaced fracture of olecranon process without intraarticular extension of left ulna, subsequent encounter for closed fracture with routine healing: Secondary | ICD-10-CM | POA: Diagnosis not present

## 2022-02-28 DIAGNOSIS — D649 Anemia, unspecified: Secondary | ICD-10-CM | POA: Diagnosis not present

## 2022-02-28 DIAGNOSIS — R002 Palpitations: Secondary | ICD-10-CM | POA: Diagnosis not present

## 2022-02-28 DIAGNOSIS — S02401D Maxillary fracture, unspecified, subsequent encounter for fracture with routine healing: Secondary | ICD-10-CM | POA: Diagnosis not present

## 2022-02-28 DIAGNOSIS — N289 Disorder of kidney and ureter, unspecified: Secondary | ICD-10-CM

## 2022-02-28 DIAGNOSIS — S0232XD Fracture of orbital floor, left side, subsequent encounter for fracture with routine healing: Secondary | ICD-10-CM | POA: Diagnosis not present

## 2022-02-28 DIAGNOSIS — I10 Essential (primary) hypertension: Secondary | ICD-10-CM | POA: Diagnosis not present

## 2022-02-28 DIAGNOSIS — I4892 Unspecified atrial flutter: Secondary | ICD-10-CM | POA: Diagnosis not present

## 2022-02-28 LAB — CBC
Hematocrit: 28.9 % — ABNORMAL LOW (ref 34.0–46.6)
Hemoglobin: 9.5 g/dL — ABNORMAL LOW (ref 11.1–15.9)
MCH: 31.6 pg (ref 26.6–33.0)
MCHC: 32.9 g/dL (ref 31.5–35.7)
MCV: 96 fL (ref 79–97)
Platelets: 336 x10E3/uL (ref 150–450)
RBC: 3.01 x10E6/uL — ABNORMAL LOW (ref 3.77–5.28)
RDW: 12.9 % (ref 11.7–15.4)
WBC: 6 x10E3/uL (ref 3.4–10.8)

## 2022-02-28 NOTE — Patient Instructions (Addendum)
Medication Instructions:  You may take an extra 0.1 mg clonidine if your systolic (top number) blood pressure is greater than 175. If you have multiple readings greater than 175 let us know. *If you need a refill on your cardiac medications before your next appointment, please call your pharmacy*   Lab Work: CBC today If you have labs (blood work) drawn today and your tests are completely normal, you will receive your results only by: [facility] (if you have MyChart) OR A paper copy in the mail If you have any lab test that is abnormal or we need to change your treatment, we will call you to review the results.   Testing/Procedures: Your physician has requested that you have a carotid duplex. This test is an ultrasound of the carotid arteries in your neck. It looks at blood flow through these arteries that supply the brain with blood. Allow one hour for this exam. There are no restrictions or special instructions.   ZIO XT- Long Term Monitor Instructions  Your physician has requested you wear a ZIO patch monitor for 14 days.  This is a single patch monitor. [employer] supplies one patch monitor per enrollment. Additional stickers are not available. Please do not apply patch if you will be having a Nuclear Stress Test,  Echocardiogram, Cardiac CT, MRI, or Chest Xray during the period you would be wearing the  monitor. The patch cannot be worn during these tests. You cannot remove and re-apply the  ZIO XT patch monitor.  Your ZIO patch monitor will be mailed 3 day USPS to your address on file. It may take 3-5 days  to receive your monitor after you have been enrolled.  Once you have received your monitor, please review the enclosed instructions. Your monitor  has already been registered assigning a specific monitor serial # to you.  Billing and Patient Assistance Program Information  We have supplied [employer] with any of your insurance information on file for billing purposes. [employer]  offers a sliding scale Patient Assistance Program for patients that do not have  insurance, or whose insurance does not completely cover the cost of the ZIO monitor.  You must apply for the Patient Assistance Program to qualify for this discounted rate.  To apply, please call [employer] at [phone], select option 4, select option 2, ask to apply for  Patient Assistance Program. Theodore Demark will ask your household income, and how many people  are in your household. They will quote your out-of-pocket cost based on that information.  [employer] will also be able to set up a 82-month interest-free payment plan if needed.  Applying the monitor   Shave hair from upper left chest.  Hold abrader disc by orange tab. Rub abrader in 40 strokes over the upper left chest as  indicated in your monitor instructions.  Clean area with 4 enclosed alcohol pads. Let dry.  Apply patch as indicated in monitor instructions. Patch will be placed under collarbone on left  side of chest with arrow pointing upward.  Rub patch adhesive wings for 2 minutes. Remove white label marked "1". Remove the white  label marked "2". Rub patch adhesive wings for 2 additional minutes.  While looking in a mirror, press and release button in center of patch. A small green light will  flash 3-4 times. This will be your only indicator that the monitor has been turned on.  Do not shower for the first 24 hours. You may shower after the first 24 hours.  Press  the button if you feel a symptom. You will hear a small click. Record Date, Time and  Symptom in the Patient Logbook.  When you are ready to remove the patch, follow instructions on the last 2 pages of Patient  Logbook. Stick patch monitor onto the last page of Patient Logbook.  Place Patient Logbook in the blue and white box. Use locking tab on box and tape box closed  securely. The blue and white box has prepaid postage on it. Please place it in the mailbox as  soon as possible. Your  physician should have your test results approximately 7 days after the  monitor has been mailed back to [facility].  Call [facility] at [phone] if you have questions regarding  your ZIO XT patch monitor. Call them immediately if you see an orange light blinking on your  monitor.  If your monitor falls off in less than 4 days, contact our Monitor department at [phone].  If your monitor becomes loose or falls off after 4 days call [employer] at [phone] for  suggestions on securing your monitor    Follow-Up: At [facility], you and your health needs are our priority.  As part of our continuing mission to provide you with exceptional heart care, we have created designated Provider Care Teams.  These Care Teams include your primary Cardiologist (physician) and Advanced Practice Providers (APPs -  Physician Assistants and Nurse Practitioners) who all work together to provide you with the care you need, when you need it.   Your next appointment:   3 month(s)  Provider:   Dorris Carnes, MD

## 2022-02-28 NOTE — Progress Notes (Unsigned)
[id number] from office inventory applied to patient.

## 2022-03-01 DIAGNOSIS — I701 Atherosclerosis of renal artery: Secondary | ICD-10-CM | POA: Diagnosis not present

## 2022-03-01 DIAGNOSIS — I4892 Unspecified atrial flutter: Secondary | ICD-10-CM | POA: Diagnosis not present

## 2022-03-01 DIAGNOSIS — D649 Anemia, unspecified: Secondary | ICD-10-CM | POA: Diagnosis not present

## 2022-03-01 DIAGNOSIS — S02401D Maxillary fracture, unspecified, subsequent encounter for fracture with routine healing: Secondary | ICD-10-CM | POA: Diagnosis not present

## 2022-03-01 DIAGNOSIS — I10 Essential (primary) hypertension: Secondary | ICD-10-CM | POA: Diagnosis not present

## 2022-03-01 DIAGNOSIS — S52022D Displaced fracture of olecranon process without intraarticular extension of left ulna, subsequent encounter for closed fracture with routine healing: Secondary | ICD-10-CM | POA: Diagnosis not present

## 2022-03-01 DIAGNOSIS — I480 Paroxysmal atrial fibrillation: Secondary | ICD-10-CM | POA: Diagnosis not present

## 2022-03-01 DIAGNOSIS — R002 Palpitations: Secondary | ICD-10-CM | POA: Diagnosis not present

## 2022-03-01 DIAGNOSIS — S0232XD Fracture of orbital floor, left side, subsequent encounter for fracture with routine healing: Secondary | ICD-10-CM | POA: Diagnosis not present

## 2022-03-03 ENCOUNTER — Encounter: Payer: Self-pay | Admitting: Internal Medicine

## 2022-03-04 MED ORDER — SERTRALINE HCL 25 MG PO TABS: 25.0000 mg | ORAL_TABLET | Freq: Every day | ORAL | 5 refills | Status: DC

## 2022-03-05 ENCOUNTER — Ambulatory Visit (HOSPITAL_COMMUNITY)
Admission: RE | Admit: 2022-03-05 | Discharge: 2022-03-05 | Disposition: A | Discharge status: Home / Self Care | Payer: Medicare PPO | Source: Ambulatory Visit | Attending: Physician Assistant | Admitting: Physician Assistant

## 2022-03-05 DIAGNOSIS — R55 Syncope and collapse: Secondary | ICD-10-CM | POA: Insufficient documentation

## 2022-03-06 DIAGNOSIS — S02401D Maxillary fracture, unspecified, subsequent encounter for fracture with routine healing: Secondary | ICD-10-CM | POA: Diagnosis not present

## 2022-03-06 DIAGNOSIS — E039 Hypothyroidism, unspecified: Secondary | ICD-10-CM

## 2022-03-06 DIAGNOSIS — S52022D Displaced fracture of olecranon process without intraarticular extension of left ulna, subsequent encounter for closed fracture with routine healing: Secondary | ICD-10-CM | POA: Diagnosis not present

## 2022-03-06 DIAGNOSIS — Z9181 History of falling: Secondary | ICD-10-CM

## 2022-03-06 DIAGNOSIS — D649 Anemia, unspecified: Secondary | ICD-10-CM | POA: Diagnosis not present

## 2022-03-06 DIAGNOSIS — I4892 Unspecified atrial flutter: Secondary | ICD-10-CM | POA: Diagnosis not present

## 2022-03-06 DIAGNOSIS — I701 Atherosclerosis of renal artery: Secondary | ICD-10-CM | POA: Diagnosis not present

## 2022-03-06 DIAGNOSIS — I10 Essential (primary) hypertension: Secondary | ICD-10-CM | POA: Diagnosis not present

## 2022-03-06 DIAGNOSIS — Z7901 Long term (current) use of anticoagulants: Secondary | ICD-10-CM

## 2022-03-06 DIAGNOSIS — Z85828 Personal history of other malignant neoplasm of skin: Secondary | ICD-10-CM

## 2022-03-06 DIAGNOSIS — I480 Paroxysmal atrial fibrillation: Secondary | ICD-10-CM | POA: Diagnosis not present

## 2022-03-06 DIAGNOSIS — S0232XD Fracture of orbital floor, left side, subsequent encounter for fracture with routine healing: Secondary | ICD-10-CM | POA: Diagnosis not present

## 2022-03-06 DIAGNOSIS — W19XXXD Unspecified fall, subsequent encounter: Secondary | ICD-10-CM

## 2022-03-06 DIAGNOSIS — M1990 Unspecified osteoarthritis, unspecified site: Secondary | ICD-10-CM

## 2022-03-06 DIAGNOSIS — R002 Palpitations: Secondary | ICD-10-CM | POA: Diagnosis not present

## 2022-03-06 DIAGNOSIS — Z792 Long term (current) use of antibiotics: Secondary | ICD-10-CM

## 2022-03-06 DIAGNOSIS — Z95818 Presence of other cardiac implants and grafts: Secondary | ICD-10-CM

## 2022-03-07 ENCOUNTER — Encounter: Payer: Self-pay | Admitting: Orthopedic Surgery

## 2022-03-07 ENCOUNTER — Ambulatory Visit (INDEPENDENT_AMBULATORY_CARE_PROVIDER_SITE_OTHER): Payer: Medicare PPO | Admitting: Orthopedic Surgery

## 2022-03-07 ENCOUNTER — Ambulatory Visit (INDEPENDENT_AMBULATORY_CARE_PROVIDER_SITE_OTHER): Payer: Medicare PPO

## 2022-03-07 DIAGNOSIS — S52022D Displaced fracture of olecranon process without intraarticular extension of left ulna, subsequent encounter for closed fracture with routine healing: Secondary | ICD-10-CM | POA: Diagnosis not present

## 2022-03-07 NOTE — Patient Instructions (Signed)
Note to physical therapist patient is advised to do 30-40 active range of motion flexion extension exercises left elbow, 3 times a day  Wear sling until she sees me

## 2022-03-07 NOTE — Progress Notes (Signed)
Chief Complaint  Patient presents with   Elbow Injury    Left 02/14/22   Encounter Diagnosis  Name Primary?   Olecranon fracture, left, closed, with routine healing, subsequent encounter Yes    Amy [name] is 87 years old she has a nondisplaced comminuted olecranon fracture that we chose to treat with casting.  She has had no significant increase in the separation of the fracture fragments at 3 weeks on today's x-ray  She has active range of motion 40-1 15  Recommend active range of motion 3 times a day  Check range of motion in 3 weeks  Sling until she sees me but she does not have to sleep in

## 2022-03-11 ENCOUNTER — Ambulatory Visit: Payer: Medicare PPO

## 2022-03-12 ENCOUNTER — Telehealth: Payer: Self-pay | Admitting: Orthopedic Surgery

## 2022-03-12 DIAGNOSIS — S0232XD Fracture of orbital floor, left side, subsequent encounter for fracture with routine healing: Secondary | ICD-10-CM | POA: Diagnosis not present

## 2022-03-12 DIAGNOSIS — I480 Paroxysmal atrial fibrillation: Secondary | ICD-10-CM | POA: Diagnosis not present

## 2022-03-12 DIAGNOSIS — R002 Palpitations: Secondary | ICD-10-CM | POA: Diagnosis not present

## 2022-03-12 DIAGNOSIS — S02401D Maxillary fracture, unspecified, subsequent encounter for fracture with routine healing: Secondary | ICD-10-CM | POA: Diagnosis not present

## 2022-03-12 DIAGNOSIS — D649 Anemia, unspecified: Secondary | ICD-10-CM | POA: Diagnosis not present

## 2022-03-12 DIAGNOSIS — I10 Essential (primary) hypertension: Secondary | ICD-10-CM | POA: Diagnosis not present

## 2022-03-12 DIAGNOSIS — I4892 Unspecified atrial flutter: Secondary | ICD-10-CM | POA: Diagnosis not present

## 2022-03-12 DIAGNOSIS — S52022D Displaced fracture of olecranon process without intraarticular extension of left ulna, subsequent encounter for closed fracture with routine healing: Secondary | ICD-10-CM | POA: Diagnosis not present

## 2022-03-12 DIAGNOSIS — I701 Atherosclerosis of renal artery: Secondary | ICD-10-CM | POA: Diagnosis not present

## 2022-03-12 NOTE — Telephone Encounter (Signed)
Note to physical therapist patient is advised to do 30-40 active range of motion flexion extension exercises left elbow, 3 times a day    I will call when I can

## 2022-03-12 NOTE — Telephone Encounter (Signed)
I called Amy [name] left message. Advised NWB and gave the flexion restriction on her.

## 2022-03-12 NOTE — Telephone Encounter (Signed)
Amy [name] w/[facility] [phone] needs clarification for weight bearing and range of motion.

## 2022-03-12 NOTE — Telephone Encounter (Signed)
She called back asked about supination and pronation advised per Dr Aline Brochure both are fine just limit WB AND=ROM as he advised

## 2022-03-14 DIAGNOSIS — S02401D Maxillary fracture, unspecified, subsequent encounter for fracture with routine healing: Secondary | ICD-10-CM | POA: Diagnosis not present

## 2022-03-14 DIAGNOSIS — I480 Paroxysmal atrial fibrillation: Secondary | ICD-10-CM | POA: Diagnosis not present

## 2022-03-14 DIAGNOSIS — S52022D Displaced fracture of olecranon process without intraarticular extension of left ulna, subsequent encounter for closed fracture with routine healing: Secondary | ICD-10-CM | POA: Diagnosis not present

## 2022-03-14 DIAGNOSIS — I10 Essential (primary) hypertension: Secondary | ICD-10-CM | POA: Diagnosis not present

## 2022-03-14 DIAGNOSIS — I4892 Unspecified atrial flutter: Secondary | ICD-10-CM | POA: Diagnosis not present

## 2022-03-14 DIAGNOSIS — D649 Anemia, unspecified: Secondary | ICD-10-CM | POA: Diagnosis not present

## 2022-03-14 DIAGNOSIS — S0232XD Fracture of orbital floor, left side, subsequent encounter for fracture with routine healing: Secondary | ICD-10-CM | POA: Diagnosis not present

## 2022-03-14 DIAGNOSIS — I701 Atherosclerosis of renal artery: Secondary | ICD-10-CM | POA: Diagnosis not present

## 2022-03-14 DIAGNOSIS — R002 Palpitations: Secondary | ICD-10-CM | POA: Diagnosis not present

## 2022-03-18 DIAGNOSIS — I701 Atherosclerosis of renal artery: Secondary | ICD-10-CM | POA: Diagnosis not present

## 2022-03-18 DIAGNOSIS — I480 Paroxysmal atrial fibrillation: Secondary | ICD-10-CM | POA: Diagnosis not present

## 2022-03-18 DIAGNOSIS — I10 Essential (primary) hypertension: Secondary | ICD-10-CM | POA: Diagnosis not present

## 2022-03-18 DIAGNOSIS — S52022D Displaced fracture of olecranon process without intraarticular extension of left ulna, subsequent encounter for closed fracture with routine healing: Secondary | ICD-10-CM | POA: Diagnosis not present

## 2022-03-18 DIAGNOSIS — S02401D Maxillary fracture, unspecified, subsequent encounter for fracture with routine healing: Secondary | ICD-10-CM | POA: Diagnosis not present

## 2022-03-18 DIAGNOSIS — D649 Anemia, unspecified: Secondary | ICD-10-CM | POA: Diagnosis not present

## 2022-03-18 DIAGNOSIS — I4892 Unspecified atrial flutter: Secondary | ICD-10-CM | POA: Diagnosis not present

## 2022-03-18 DIAGNOSIS — R002 Palpitations: Secondary | ICD-10-CM | POA: Diagnosis not present

## 2022-03-18 DIAGNOSIS — S0232XD Fracture of orbital floor, left side, subsequent encounter for fracture with routine healing: Secondary | ICD-10-CM | POA: Diagnosis not present

## 2022-03-19 DIAGNOSIS — I480 Paroxysmal atrial fibrillation: Secondary | ICD-10-CM | POA: Diagnosis not present

## 2022-03-19 DIAGNOSIS — D649 Anemia, unspecified: Secondary | ICD-10-CM | POA: Diagnosis not present

## 2022-03-19 DIAGNOSIS — S0232XD Fracture of orbital floor, left side, subsequent encounter for fracture with routine healing: Secondary | ICD-10-CM | POA: Diagnosis not present

## 2022-03-19 DIAGNOSIS — I4892 Unspecified atrial flutter: Secondary | ICD-10-CM | POA: Diagnosis not present

## 2022-03-19 DIAGNOSIS — I701 Atherosclerosis of renal artery: Secondary | ICD-10-CM | POA: Diagnosis not present

## 2022-03-19 DIAGNOSIS — S02401D Maxillary fracture, unspecified, subsequent encounter for fracture with routine healing: Secondary | ICD-10-CM | POA: Diagnosis not present

## 2022-03-19 DIAGNOSIS — I10 Essential (primary) hypertension: Secondary | ICD-10-CM | POA: Diagnosis not present

## 2022-03-19 DIAGNOSIS — S52022D Displaced fracture of olecranon process without intraarticular extension of left ulna, subsequent encounter for closed fracture with routine healing: Secondary | ICD-10-CM | POA: Diagnosis not present

## 2022-03-19 DIAGNOSIS — R002 Palpitations: Secondary | ICD-10-CM | POA: Diagnosis not present

## 2022-03-21 ENCOUNTER — Encounter: Payer: Self-pay | Admitting: Radiology

## 2022-03-21 ENCOUNTER — Ambulatory Visit (INDEPENDENT_AMBULATORY_CARE_PROVIDER_SITE_OTHER): Payer: Medicare PPO

## 2022-03-21 DIAGNOSIS — S0232XD Fracture of orbital floor, left side, subsequent encounter for fracture with routine healing: Secondary | ICD-10-CM | POA: Diagnosis not present

## 2022-03-21 DIAGNOSIS — I10 Essential (primary) hypertension: Secondary | ICD-10-CM | POA: Diagnosis not present

## 2022-03-21 DIAGNOSIS — E538 Deficiency of other specified B group vitamins: Secondary | ICD-10-CM

## 2022-03-21 DIAGNOSIS — I701 Atherosclerosis of renal artery: Secondary | ICD-10-CM | POA: Diagnosis not present

## 2022-03-21 DIAGNOSIS — S02401D Maxillary fracture, unspecified, subsequent encounter for fracture with routine healing: Secondary | ICD-10-CM | POA: Diagnosis not present

## 2022-03-21 DIAGNOSIS — R002 Palpitations: Secondary | ICD-10-CM | POA: Diagnosis not present

## 2022-03-21 DIAGNOSIS — I4892 Unspecified atrial flutter: Secondary | ICD-10-CM | POA: Diagnosis not present

## 2022-03-21 DIAGNOSIS — I480 Paroxysmal atrial fibrillation: Secondary | ICD-10-CM | POA: Diagnosis not present

## 2022-03-21 DIAGNOSIS — S52022D Displaced fracture of olecranon process without intraarticular extension of left ulna, subsequent encounter for closed fracture with routine healing: Secondary | ICD-10-CM | POA: Diagnosis not present

## 2022-03-21 DIAGNOSIS — D649 Anemia, unspecified: Secondary | ICD-10-CM | POA: Diagnosis not present

## 2022-03-21 MED ORDER — CYANOCOBALAMIN 1000 MCG/ML IJ SOLN
1000.0000 ug | Freq: Once | INTRAMUSCULAR | Status: AC
  Administered 2022-03-21: 1000 ug via INTRAMUSCULAR

## 2022-03-21 NOTE — Progress Notes (Signed)
Pt has was given B12 injection w/o any complications.

## 2022-03-22 DIAGNOSIS — R55 Syncope and collapse: Secondary | ICD-10-CM | POA: Diagnosis not present

## 2022-03-25 DIAGNOSIS — S0232XD Fracture of orbital floor, left side, subsequent encounter for fracture with routine healing: Secondary | ICD-10-CM | POA: Diagnosis not present

## 2022-03-25 DIAGNOSIS — I4892 Unspecified atrial flutter: Secondary | ICD-10-CM | POA: Diagnosis not present

## 2022-03-25 DIAGNOSIS — D649 Anemia, unspecified: Secondary | ICD-10-CM | POA: Diagnosis not present

## 2022-03-25 DIAGNOSIS — I480 Paroxysmal atrial fibrillation: Secondary | ICD-10-CM | POA: Diagnosis not present

## 2022-03-25 DIAGNOSIS — R002 Palpitations: Secondary | ICD-10-CM | POA: Diagnosis not present

## 2022-03-25 DIAGNOSIS — I701 Atherosclerosis of renal artery: Secondary | ICD-10-CM | POA: Diagnosis not present

## 2022-03-25 DIAGNOSIS — S02401D Maxillary fracture, unspecified, subsequent encounter for fracture with routine healing: Secondary | ICD-10-CM | POA: Diagnosis not present

## 2022-03-25 DIAGNOSIS — S52022D Displaced fracture of olecranon process without intraarticular extension of left ulna, subsequent encounter for closed fracture with routine healing: Secondary | ICD-10-CM | POA: Diagnosis not present

## 2022-03-25 DIAGNOSIS — I10 Essential (primary) hypertension: Secondary | ICD-10-CM | POA: Diagnosis not present

## 2022-03-26 DIAGNOSIS — S52022D Displaced fracture of olecranon process without intraarticular extension of left ulna, subsequent encounter for closed fracture with routine healing: Secondary | ICD-10-CM | POA: Diagnosis not present

## 2022-03-26 DIAGNOSIS — I4892 Unspecified atrial flutter: Secondary | ICD-10-CM | POA: Diagnosis not present

## 2022-03-26 DIAGNOSIS — S02401D Maxillary fracture, unspecified, subsequent encounter for fracture with routine healing: Secondary | ICD-10-CM | POA: Diagnosis not present

## 2022-03-26 DIAGNOSIS — I10 Essential (primary) hypertension: Secondary | ICD-10-CM | POA: Diagnosis not present

## 2022-03-26 DIAGNOSIS — D649 Anemia, unspecified: Secondary | ICD-10-CM | POA: Diagnosis not present

## 2022-03-26 DIAGNOSIS — I701 Atherosclerosis of renal artery: Secondary | ICD-10-CM | POA: Diagnosis not present

## 2022-03-26 DIAGNOSIS — I480 Paroxysmal atrial fibrillation: Secondary | ICD-10-CM | POA: Diagnosis not present

## 2022-03-26 DIAGNOSIS — S0232XD Fracture of orbital floor, left side, subsequent encounter for fracture with routine healing: Secondary | ICD-10-CM | POA: Diagnosis not present

## 2022-03-26 DIAGNOSIS — R002 Palpitations: Secondary | ICD-10-CM | POA: Diagnosis not present

## 2022-03-28 ENCOUNTER — Other Ambulatory Visit: Payer: Self-pay | Admitting: Internal Medicine

## 2022-03-28 ENCOUNTER — Encounter: Payer: Medicare PPO | Admitting: Orthopedic Surgery

## 2022-03-28 ENCOUNTER — Encounter: Payer: Self-pay | Admitting: Orthopaedic Surgery

## 2022-03-28 ENCOUNTER — Ambulatory Visit (INDEPENDENT_AMBULATORY_CARE_PROVIDER_SITE_OTHER): Payer: Medicare PPO

## 2022-03-28 ENCOUNTER — Ambulatory Visit (INDEPENDENT_AMBULATORY_CARE_PROVIDER_SITE_OTHER): Payer: Medicare PPO | Admitting: Orthopaedic Surgery

## 2022-03-28 VITALS — BP 118/60 | HR 84 | Resp 96 | Ht 67.0 in | Wt 122.6 lb

## 2022-03-28 DIAGNOSIS — S52022D Displaced fracture of olecranon process without intraarticular extension of left ulna, subsequent encounter for closed fracture with routine healing: Secondary | ICD-10-CM

## 2022-03-28 NOTE — Addendum Note (Signed)
Addended by: Obie Dredge A on: 03/28/2022 01:41 PM   Modules accepted: Orders

## 2022-03-28 NOTE — Progress Notes (Signed)
I am better.  She has olecranon fracture on the left.  She has been in a sling and seen by home health.  She has no new trauma. She has come out of the sling at home.  NV intact. She lacks about 25 degrees of full extension.  Pronation of wrist full but supination decreased by 25 degrees.  Grips good.  Resolving ecchymosis of the left face.  X-rays were done of the left elbow, reported separately.  Encounter Diagnosis  Name Primary?   Olecranon fracture, left, closed, with routine healing, subsequent encounter Yes   I have gone over exercises to do with a hammer for supination and pronation.  We will let home health know.  Return in two weeks.  See Dr. Aline Brochure.  X-rays then.  Call if any problem.  Precautions discussed.  Electronically [place], MD 3/7/20249:55 AM

## 2022-03-28 NOTE — Patient Instructions (Addendum)
CONTINUE YOUR THERAPY WITH HOME HEALTH  DO THE HAMMER EXERCISE AS DEMONSTRATED BY DR.KEELING THIS MORNING. START WITH YOUR HAND AT THE HEAD (HEAVY) END OF THE HAMMER. YOU WILL EVENTUALLY WORK YOUR HAND ALL THE WAY DOWN THE HANDLE OF IT.   I WILL GET IN TOUCH WITH [facility]: THEY NEED TO WORK ON PRONATION/SUPINATION WITH YOUR ELBOW.

## 2022-03-29 ENCOUNTER — Encounter: Payer: Self-pay | Admitting: Internal Medicine

## 2022-04-03 NOTE — Telephone Encounter (Signed)
Reviewed with Beckie Salts   With episode of syncope he questions if she had a pause    Would reocmm a LINQ    Can do week after next   Arrange with Merrilee Seashore  Also, please confirm that pt has follow up with Annamarie Major in not too distant future    Labile BP   Hx RAS  Please call pt with plans

## 2022-04-04 NOTE — Telephone Encounter (Signed)
I spoke with the pt and she will be seeing Dr Lovena Le for a LINQ  on 04/19/22... I went over the procedure and benefits of the [facility] and we will give her more information when she sees Dr Harrington Challenger 04/15/22.   Pt says she is feeling well... much stronger and and this is the first week she has been able to do her own ADL's with assistance.   She will start monitoring her BP and she will call Dr Stephens Shire office for an appt.

## 2022-04-09 DIAGNOSIS — H40012 Open angle with borderline findings, low risk, left eye: Secondary | ICD-10-CM | POA: Diagnosis not present

## 2022-04-09 DIAGNOSIS — H401111 Primary open-angle glaucoma, right eye, mild stage: Secondary | ICD-10-CM | POA: Diagnosis not present

## 2022-04-10 ENCOUNTER — Ambulatory Visit: Payer: Medicare PPO

## 2022-04-11 ENCOUNTER — Ambulatory Visit (INDEPENDENT_AMBULATORY_CARE_PROVIDER_SITE_OTHER): Payer: Medicare PPO | Admitting: Orthopedic Surgery

## 2022-04-11 ENCOUNTER — Other Ambulatory Visit (INDEPENDENT_AMBULATORY_CARE_PROVIDER_SITE_OTHER): Payer: Medicare PPO

## 2022-04-11 ENCOUNTER — Encounter: Payer: Self-pay | Admitting: Orthopedic Surgery

## 2022-04-11 DIAGNOSIS — S52022D Displaced fracture of olecranon process without intraarticular extension of left ulna, subsequent encounter for closed fracture with routine healing: Secondary | ICD-10-CM | POA: Diagnosis not present

## 2022-04-11 NOTE — Progress Notes (Signed)
Chief Complaint  Patient presents with   Elbow Injury    Left 02/14/22  / feels better is able to perform all ADLs now    Encounter Diagnosis  Name Primary?   Olecranon fracture, left, closed, with routine healing, subsequent encounter Yes   Xray X-ray report  Chief complaint 2 views left elbow left elbow fracture  Images AP and lateral left elbow  Reading: Comminuted fracture of the left elbow with fibrous union across the fragment fracture fragment there is also a separate fracture fragment at the tip of the olecranon  Impression: Fibrous union left elbow olecranon fracture (note clinically patient has returned to normal)  The patient has actually some wrist pain which she describes as soreness.  Palpation and stress testing revealed no abnormalities.  She has a 15 degree flexion contracture of the left elbow with normal flexion and rotation  Patient was advised to return as needed increase activities as tolerated and use liniment on the wrist

## 2022-04-15 ENCOUNTER — Ambulatory Visit: Payer: Medicare PPO | Attending: Internal Medicine | Admitting: Internal Medicine

## 2022-04-15 ENCOUNTER — Encounter: Payer: Self-pay | Admitting: Internal Medicine

## 2022-04-15 VITALS — BP 110/62 | HR 81 | Ht 67.0 in | Wt 123.4 lb

## 2022-04-15 DIAGNOSIS — R55 Syncope and collapse: Secondary | ICD-10-CM | POA: Diagnosis not present

## 2022-04-15 NOTE — Progress Notes (Unsigned)
Cardiology Office Note   Date:  04/15/2022   ID:  WONNIE FETCHO, DOB 1935/10/23, MRN [id number]  PCP:  Binnie Rail, MD  Cardiologist:   Dorris Carnes, MD    Pt presents for follow up of syncope, HTN and atrial fibrillation   History of Present Illness: Amy [name] is a 87 y.o. female with a history of atrial flutter (s/p ablation 2008), atrial fibrillation, syncope, HTN, RAS (s/p PTA/stent to R renal artery Jan 2023  I saw the pt in Sept 2023  On  Nov 08, 2021 she was seen in ER for severe BP elevations   RA USN 11/20/21 R renal artery > 60% stenotic   Referred to [facility] Seen in office on 01/24/22   Complained of ankle edema   BP 120s to 150/  Amlodipine decreased from 10 mg to 5 mg, losartan stopped and olmesartan 40 mg started.  She was seen in ER on 02/14/22 with severe elevation in BP  Had some jaw pain and indigestio She took a clonidine prior to ER arrival   Got another clonidine there as well as IV fluids   She was in SR at the time   Discharged and on walking out of ER had a syncopal spell  No prodrome   Suffered a maxillary, orbital and L olecranon fracture   She was admitted and started on cardene drip.   This was stopped Seen by Joellyn Rued on 02/28/22   BP 126/48 and p 50 at time (on 2.5 amlodipine, 0.1tid clonidine, 100 mg tid hydralazine, 50mg  bid losartan)  Carotid USN ordered and Zio patch    Monitor showed that she was in afib 91% of time   Since seen she has slowly recovered from fall/fractures      She denies palpitations.   Breathing is OK     BP labile No dizziness    Outpatient Medications Prior to Visit  Medication Sig Dispense Refill   acetaminophen (TYLENOL) 650 MG CR tablet Take 1,300 mg by mouth 2 (two) times daily as needed for pain.     AMBULATORY NON FORMULARY MEDICATION Take 1 drop by mouth daily as needed (pain). CBD Oil 1 dose under tongue once daily as needed for pain     amLODipine (NORVASC) 5 MG tablet Take 1 tablet (5 mg total) by mouth daily. 180  tablet 3   atenolol (TENORMIN) 50 MG tablet Take 1 tablet (50 mg total) by mouth daily. 90 tablet 3   Calcium Carbonate-Vitamin D (CALCIUM + D PO) Take 1 tablet by mouth daily.     cloNIDine (CATAPRES) 0.1 MG tablet Take 1 tablet (0.1 mg total) by mouth 3 (three) times daily. 270 tablet 3   flecainide (TAMBOCOR) 50 MG tablet TAKE 1 TABLET BY MOUTH TWICE A DAY 180 tablet 3   hydrALAZINE (APRESOLINE) 100 MG tablet TAKE 1 TABLET BY MOUTH 3 TIMES DAILY. 270 tablet 3   latanoprost (XALATAN) 0.005 % ophthalmic solution SMARTSIG:In Eye(s)     levothyroxine (SYNTHROID) 50 MCG tablet TAKE 1 TABLET BY MOUTH EVERY DAY 90 tablet 3   linaclotide (LINZESS) 72 MCG capsule Take 72 mcg by mouth as needed (for constipation).     olmesartan (BENICAR) 40 MG tablet Take 1 tablet (40 mg total) by mouth daily. 30 tablet 11   XARELTO 15 MG TABS tablet TAKE 1 TABLET BY MOUTH EVERY DAY WITH SUPPER 30 tablet 5   amoxicillin (AMOXIL) 500 MG capsule Take 500 mg by mouth  3 (three) times daily as needed (For gum flare up). (Patient not taking: Reported on 04/15/2022)     sertraline (ZOLOFT) 25 MG tablet TAKE 1 TABLET (25 MG TOTAL) BY MOUTH DAILY. (Patient not taking: Reported on 04/15/2022) 90 tablet 2   dorzolamide-timolol (COSOPT) 22.3-6.8 MG/ML ophthalmic solution Place 1 drop into both eyes 2 (two) times daily. (Patient not taking: Reported on 04/15/2022)     No facility-administered medications prior to visit.     Allergies:   Patient has no known allergies.   Past Medical History:  Diagnosis Date   Anemia    Arthritis    Atrial fibrillation (HCC)    Atrial flutter ([place])    s/p ablation   Atypical mole 09/07/2013   LOWER LEG SEVERE TX= WIDER SHAVE    HTN (hypertension)    Hypothyroidism    Nodular basal cell carcinoma (BCC) 03/29/2020   Right Malar Cheek   Renal artery stenosis [facility])     Past Surgical History:  Procedure Laterality Date   A FLUTTER ABLATION     KNEE ARTHROSCOPY Right    PERIPHERAL  VASCULAR INTERVENTION Right 02/13/2021   Procedure: PERIPHERAL VASCULAR INTERVENTION;  Surgeon: Serafina Mitchell, MD;  Location: [place] [facility];  Service: Cardiovascular;  Laterality: Right;   RENAL ANGIOGRAPHY N/A 02/13/2021   Procedure: RENAL ANGIOGRAPHY;  Surgeon: Serafina Mitchell, MD;  Location: [place] [facility];  Service: Cardiovascular;  Laterality: N/A;   TUBAL LIGATION       Social History:  The patient  reports that she has never smoked. She has never been exposed to tobacco smoke. She has never used smokeless tobacco. She reports that she does not drink alcohol and does not use drugs.   Family History:  The patient's family history includes Colon cancer in her brother; Heart disease in her mother; Stroke (age of onset: 3) in her mother; Stroke (age of onset: 47) in her father.    ROS:  Please see the history of present illness. All other systems are reviewed and  Negative to the above problem except as noted.    PHYSICAL EXAM: VS:  BP 110/62   Pulse 81   Ht 5\' 7"  (1.702 m)   Wt 123 lb 6.4 oz (56 kg)   LMP  (LMP Unknown)   SpO2 98%   BMI 19.33 kg/m      GEN: Thin 87 yo who is in NAD  HEENT: tStill with old bruise on L maxilla Neck: JVP is normal    Cardiac:Irreg irreg ; no murmur  No LE edema  Respiratory:  clear to auscultation  GII: soft, nontender  No hepatomegaly  MS: no deformity Moving all extremities   Skin: warm and dry, no rash Psych: euthymic mood, full affect   EKG:  EKG   Atrial fibrillation   78 bpm   RBBBB  Event monitor   Jan 2024  Predominant rhythm:  Atrial fibrillation 91% of time   Rates 47 to 133 bpm   Average HR 77 bpm   Rare PVC  Carotid USN  Jan 2024  Right Carotid: Velocities in the right ICA are consistent with a 1-39%  stenosis.  Left Carotid: There is no evidence of stenosis in the left ICA. The  extracranial vessels were near-normal with only minimal wall thickening or plaque.  Vertebrals:  Bilateral vertebral arteries  demonstrate antegrade flow.  Subclavians: Normal flow hemodynamics were seen in bilateral subclavian arteries.    Echo   02/15/22 1.  Left ventricular ejection fraction, by estimation, is 65 to 70%. The  left ventricle has normal function. The left ventricle has no regional  wall motion abnormalities. Left ventricular diastolic parameters are  consistent with Grade II diastolic  dysfunction (pseudonormalization).   2. Right ventricular systolic function is normal. The right ventricular  size is normal. There is moderately elevated pulmonary artery systolic  pressure. The estimated right ventricular systolic pressure is 0000000 mmHg.   3. Left atrial size was moderately dilated.   4. A small pericardial effusion is present. The pericardial effusion is  posterior to the left ventricle.   5. The mitral valve is degenerative. Mild mitral valve regurgitation.   6. Tricuspid valve regurgitation is mild to moderate.   7. The aortic valve is tricuspid. There is mild calcification of the  aortic valve. Aortic valve regurgitation is trivial. Aortic valve  sclerosis/calcification is present, without any evidence of aortic  stenosis.   8. The inferior vena cava is normal in size with greater than 50%  respiratory variability, suggesting right atrial pressure of 3 mmHg.   Renal Artery [place] 11/20/21 Summary:  Renal:    Right: Evidence of a > 60% stenosis of the right renal artery. RRV         flow present. Abnormal size for the right kidney. Abnormal         right Resistive Index. Normal cortical thickness of right         kidney.  Left:  No evidence of left renal artery stenosis. LRV flow present.         Abnormal size for the left kidney. Abnormal left Resisitve         Index. Normal cortical thickness of the left kidney.  Mesenteric:  Normal Celiac artery findings. 70 to 99% stenosis in the superior  mesenteric  artery. Areas of limited visceral study include right kidney size, left  kidney   size and right parenchymal flow.    Summary: Renal: Right: Abnormal right Resistive Index. Normal cortical thickness of right kidney. Probable occlusion of the right renal artery. Left: Abnormal left Resisitve Index. Normal cortical thickness of the left kidney. Normal size of left kidney. No evidence of left renal artery stenosis. LRV flow present. Mesenteric: Normal Celiac artery findings. 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   Lipid Panel    Component Value Date/Time   CHOL 219 (H) 10/12/2020 1617   TRIG 85 10/12/2020 1617   HDL 95 10/12/2020 1617   CHOLHDL 2.3 10/12/2020 1617   CHOLHDL 2.0 06/05/2015 0946   VLDL 10 06/05/2015 0946   LDLCALC 109 (H) 10/12/2020 1617   LDLDIRECT 84.9 11/15/2008 0924      Wt Readings from Last 3 Encounters:  04/15/22 123 lb 6.4 oz (56 kg)  03/28/22 122 lb 9.6 oz (55.6 kg)  02/28/22 123 lb 12.8 oz (56.2 kg)      ASSESSMENT AND PLAN:  1  Syncope   Event when walking out of ER in Jan   She had gotten 2 extra clonidine that day   Had been in SR prior to event   No prodrome   Concerning for potential pause   ? Orthostasis. Monitor showed no pauses    I have reviewed with Beckie Salts   He will see patient this week  2  HTN  Lable  Good today   I will make no changes   3   Atrial fibrillation   Currently in afib  On Flecanide and Xarelto      Will defer to EP about stopping flecanide    3 PAD  PT s/p PTA/stent to renal artery   USN in OCt 2023 showed >60^ stenosis R Renal artery  Refer back to [facility]    4  Lipids  Off of statin for now  Last LDL was 78 in Sept 2023      Current medicines are reviewed at length with the patient today.  The patient does not have concerns regarding medicines.  Signed, Dorris Carnes, MD  04/15/2022 11:09 AM    [address], [place], [place]  [address] Phone: [phone]; Fax: [phone]

## 2022-04-15 NOTE — Patient Instructions (Signed)
Medication Instructions:   *If you need a refill on your cardiac medications before your next appointment, please call your pharmacy*   Lab Work:  If you have labs (blood work) drawn today and your tests are completely normal, you will receive your results only by: MyChart Message (if you have MyChart) OR A paper copy in the mail If you have any lab test that is abnormal or we need to change your treatment, we will call you to review the results.   Testing/Procedures:    Follow-Up: At  [facility], you and your health needs are our priority.  As part of our continuing mission to provide you with exceptional heart care, we have created designated Provider Care Teams.  These Care Teams include your primary Cardiologist (physician) and Advanced Practice Providers (APPs -  Physician Assistants and Nurse Practitioners) who all work together to provide you with the care you need, when you need it.  We recommend signing up for the patient portal called "MyChart".  Sign up information is provided on this After Visit Summary.  MyChart is used to connect with patients for Virtual Visits (Telemedicine).  Patients are able to view lab/test results, encounter notes, upcoming appointments, etc.  Non-urgent messages can be sent to your provider as well.   To learn more about what you can do with MyChart, go to [email or web address].    Your next appointment:   8 month(s)  Provider:   Paula Ross, MD     Other Instructions   

## 2022-04-19 ENCOUNTER — Encounter: Payer: Self-pay | Admitting: Internal Medicine

## 2022-04-19 ENCOUNTER — Ambulatory Visit: Payer: Medicare PPO | Attending: Internal Medicine | Admitting: Internal Medicine

## 2022-04-19 VITALS — BP 126/78 | HR 102 | Ht 67.0 in | Wt 121.0 lb

## 2022-04-19 DIAGNOSIS — I10 Essential (primary) hypertension: Secondary | ICD-10-CM

## 2022-04-19 DIAGNOSIS — I639 Cerebral infarction, unspecified: Secondary | ICD-10-CM | POA: Insufficient documentation

## 2022-04-19 DIAGNOSIS — R55 Syncope and collapse: Secondary | ICD-10-CM

## 2022-04-19 DIAGNOSIS — I4891 Unspecified atrial fibrillation: Secondary | ICD-10-CM | POA: Diagnosis not present

## 2022-04-19 NOTE — Patient Instructions (Addendum)
Medication Instructions:  Your physician recommends that you continue on your current medications as directed. Please refer to the Current Medication list given to you today.  Labwork: None ordered.  Testing/Procedures: None ordered.  Follow-Up:  Your physician wants you to follow-up in: one year with Dr. Cristopher Peru or EP APP.  You will receive a reminder letter in the mail two months in advance. If you don't receive a letter, please call our office to schedule the follow-up appointment.    Implantable Loop Recorder Placement, Care After This sheet gives you information about how to care for yourself after your procedure. Your health care provider may also give you more specific instructions. If you have problems or questions, contact your health care provider. What can I expect after the procedure? After the procedure, it is common to have: Soreness or discomfort near the incision. Some swelling or bruising near the incision.  Follow these instructions at home: Incision care  Monitor your cardiac device site for redness, swelling, and drainage. Call the device clinic at [phone] if you experience these symptoms or fever/chills.  Keep the large square bandage on your site for 24 hours and then you may remove it yourself. Keep the steri-strips underneath in place.   You may shower after 72 hours / 3 days from your procedure with the steri-strips in place. They will usually fall off on their own, or may be removed after 10 days. Pat dry.   Avoid lotions, ointments, or perfumes over your incision until it is well-healed.  Please do not submerge in water until your site is completely healed.   Your device is MRI compatible.   Remote monitoring is used to monitor your cardiac device from home. This monitoring is scheduled every month by our office. It allows [place] to keep an eye on the function of your device to ensure it is working properly.  If your wound site starts to bleed  apply pressure.    For help with the monitor please call Medtronic Monitor Support Specialist directly at [phone].    If you have any questions/concerns please call the device clinic at [phone].  Activity  Return to your normal activities.  General instructions Follow instructions from your health care provider about how to manage your implantable loop recorder and transmit the information. Learn how to activate a recording if this is necessary for your type of device. You may go through a metal detection gate, and you may let someone hold a metal detector over your chest. Show your ID card if needed. Do not have an MRI unless you check with your health care provider first. Take over-the-counter and prescription medicines only as told by your health care provider. Keep all follow-up visits as told by your health care provider. This is important. Contact a health care provider if: You have redness, swelling, or pain around your incision. You have a fever. You have pain that is not relieved by your pain medicine. You have triggered your device because of fainting (syncope) or because of a heartbeat that feels like it is racing, slow, fluttering, or skipping (palpitations). Get help right away if you have: Chest pain. Difficulty breathing. Summary After the procedure, it is common to have soreness or discomfort near the incision. Change your dressing as told by your health care provider. Follow instructions from your health care provider about how to manage your implantable loop recorder and transmit the information. Keep all follow-up visits as told by your health care provider. This  is important. This information is not intended to replace advice given to you by your health care provider. Make sure you discuss any questions you have with your health care provider. Document Released: 12/19/2014 Document Revised: 02/22/2017 Document Reviewed: 02/22/2017 Elsevier Patient  Education  2020 [employer].

## 2022-04-19 NOTE — Progress Notes (Signed)
HPI Amy [name] is referred by Dr. Harrington Challenger for evaluation of unexplained syncope. Her history has been outlined in prior notes. She also has a remote h/o atrial flutter s/p ablation, and rare PAF. She has worn a cardiac monitor demonstrating mostly atrial fib with bbb. She did not have any long pauses. She denies chest pain or sob. No swelling.  No Known Allergies   Current Outpatient Medications  Medication Sig Dispense Refill   acetaminophen (TYLENOL) 650 MG CR tablet Take 1,300 mg by mouth 2 (two) times daily as needed for pain.     AMBULATORY NON FORMULARY MEDICATION Take 1 drop by mouth daily as needed (pain). CBD Oil 1 dose under tongue once daily as needed for pain     amLODipine (NORVASC) 5 MG tablet Take 1 tablet (5 mg total) by mouth daily. 180 tablet 3   amoxicillin (AMOXIL) 500 MG capsule Take 500 mg by mouth 3 (three) times daily as needed (For gum flare up).     atenolol (TENORMIN) 50 MG tablet Take 1 tablet (50 mg total) by mouth daily. 90 tablet 3   Calcium Carbonate-Vitamin D (CALCIUM + D PO) Take 1 tablet by mouth daily.     cloNIDine (CATAPRES) 0.1 MG tablet Take 1 tablet (0.1 mg total) by mouth 3 (three) times daily. 270 tablet 3   flecainide (TAMBOCOR) 50 MG tablet TAKE 1 TABLET BY MOUTH TWICE A DAY 180 tablet 3   hydrALAZINE (APRESOLINE) 100 MG tablet TAKE 1 TABLET BY MOUTH 3 TIMES DAILY. 270 tablet 3   latanoprost (XALATAN) 0.005 % ophthalmic solution SMARTSIG:In Eye(s)     levothyroxine (SYNTHROID) 50 MCG tablet TAKE 1 TABLET BY MOUTH EVERY DAY 90 tablet 3   linaclotide (LINZESS) 72 MCG capsule Take 72 mcg by mouth as needed (for constipation).     olmesartan (BENICAR) 40 MG tablet Take 1 tablet (40 mg total) by mouth daily. 30 tablet 11   sertraline (ZOLOFT) 25 MG tablet TAKE 1 TABLET (25 MG TOTAL) BY MOUTH DAILY. 90 tablet 2   XARELTO 15 MG TABS tablet TAKE 1 TABLET BY MOUTH EVERY DAY WITH SUPPER 30 tablet 5   No current facility-administered medications for  this visit.     Past Medical History:  Diagnosis Date   Anemia    Arthritis    Atrial fibrillation (HCC)    Atrial flutter ([place])    s/p ablation   Atypical mole 09/07/2013   LOWER LEG SEVERE TX= WIDER SHAVE    HTN (hypertension)    Hypothyroidism    Nodular basal cell carcinoma (BCC) 03/29/2020   Right Malar Cheek   Renal artery stenosis (HCC)     ROS:   All systems reviewed and negative except as noted in the HPI.   Past Surgical History:  Procedure Laterality Date   A FLUTTER ABLATION     KNEE ARTHROSCOPY Right    PERIPHERAL VASCULAR INTERVENTION Right 02/13/2021   Procedure: PERIPHERAL VASCULAR INTERVENTION;  Surgeon: Serafina Mitchell, MD;  Location: [place] [facility];  Service: Cardiovascular;  Laterality: Right;   RENAL ANGIOGRAPHY N/A 02/13/2021   Procedure: RENAL ANGIOGRAPHY;  Surgeon: Serafina Mitchell, MD;  Location: [place] [facility];  Service: Cardiovascular;  Laterality: N/A;   TUBAL LIGATION       Family History  Problem Relation Age of Onset   Stroke Father 21       died from stroke at age 55   Stroke Mother 35  died from stroke at age 58   Heart disease Mother    Colon cancer Brother      Social History   Socioeconomic History   Marital status: Married    Spouse name: Not on file   Number of children: 1   Years of education: Not on file   Highest education level: Not on file  Occupational History   Occupation: Retired  Tobacco Use   Smoking status: Never    Passive exposure: Never   Smokeless tobacco: Never  Vaping Use   Vaping Use: Never used  Substance and Sexual Activity   Alcohol use: No   Drug use: No   Sexual activity: Not on file  Other Topics Concern   Not on file  Social History Narrative   Occ caffeine    Social Determinants of Health   Financial Resource Strain: Low Risk  (06/26/2021)   Overall Financial Resource Strain (CARDIA)    Difficulty of Paying Living Expenses: Not hard at all  Food Insecurity: No Food  Insecurity (02/15/2022)   Hunger Vital Sign    Worried About Running Out of Food in the Last Year: Never true    [place] in the Last Year: Never true  Transportation Needs: No Transportation Needs (02/15/2022)   PRAPARE - Hydrologist (Medical): No    Lack of Transportation (Non-Medical): No  Physical Activity: Sufficiently Active (06/26/2021)   Exercise Vital Sign    Days of Exercise per Week: 5 days    Minutes of Exercise per Session: 30 min  Stress: No Stress Concern Present (06/26/2021)   [place]    Feeling of Stress : Not at all  Social Connections: [place] (06/26/2021)   Social Connection and Isolation Panel [NHANES]    Frequency of Communication with Friends and Family: More than three times a week    Frequency of Social Gatherings with Friends and Family: More than three times a week    Attends Religious Services: More than 4 times per year    Active Member of [employer] or Organizations: Yes    Attends Music therapist: More than 4 times per year    Marital Status: Married  Human resources officer Violence: Not At Risk (02/15/2022)   Humiliation, Afraid, Rape, and Kick questionnaire    Fear of Current or Ex-Partner: No    Emotionally Abused: No    Physically Abused: No    Sexually Abused: No     BP 126/78   Pulse (!) 102   Ht 5\' 7"  (1.702 m)   Wt 121 lb (54.9 kg)   LMP  (LMP Unknown)   SpO2 97%   BMI 18.95 kg/m   Physical Exam:  Well appearing thin, elderly woman, NAD HEENT: Unremarkable Neck:  No JVD, no thyromegally Lymphatics:  No adenopathy Back:  No CVA tenderness Lungs:  Clear with no wheezes HEART:  IRegular rate rhythm, no murmurs, no rubs, no clicks Abd:  soft, positive bowel sounds, no organomegally, no rebound, no guarding Ext:  2 plus pulses, no edema, no cyanosis, no clubbing Skin:  No rashes no nodules Neuro:  CN II through XII  intact, motor grossly intact   Assess/Plan: Unexplained syncope - I have discussed the treatment options with the patient and recommend insertion of an ILR.  I have outlined the benefits of the device and minimal risks and she wishes to proceed. Persistent atrial fib - she  is minimally if at all symptomatic.  EP Procedure Note  Procedure performed: ILR insertion.  Preop diagnosis: unexplained syncope.  Post op diagnosis: same as preop  Description of the procedure: After informed consent was obtained, the patient was prepped and draped in a sterile fashion. 10 cc of lidocaine was infiltrated into the left pectoral region. The Medtronic ILR # [id number] was inserted. The R waves were 0.67mV. Benzoin and steristrips were painted on the skin and a bandaged applied and the patient recovered in the usual manner.  Complications: none immediately  Conclusion: successful ILR insertion.   Amy Overlie Orena Cavazos,MD

## 2022-04-22 ENCOUNTER — Ambulatory Visit (INDEPENDENT_AMBULATORY_CARE_PROVIDER_SITE_OTHER): Payer: Medicare PPO | Admitting: *Deleted

## 2022-04-22 DIAGNOSIS — E538 Deficiency of other specified B group vitamins: Secondary | ICD-10-CM

## 2022-04-22 MED ORDER — CYANOCOBALAMIN 1000 MCG/ML IJ SOLN
1000.0000 ug | Freq: Once | INTRAMUSCULAR | Status: AC
  Administered 2022-04-22: 1000 ug via INTRAMUSCULAR

## 2022-04-22 NOTE — Progress Notes (Signed)
Pls cosign for B12 inj../lmb  

## 2022-04-30 ENCOUNTER — Encounter: Payer: Self-pay | Admitting: Internal Medicine

## 2022-04-30 DIAGNOSIS — D649 Anemia, unspecified: Secondary | ICD-10-CM | POA: Insufficient documentation

## 2022-04-30 NOTE — Progress Notes (Unsigned)
Subjective:    Patient ID: Amy [name], female    DOB: 03/12/1935, 87 y.o.   MRN: [id number]      HPI Amy [name] is here for a Physical exam and her chronic medical problems.   Still has trouble sleeping- has nocturia 2-3 times.  She is trying to increase her water intake.  Otherwise doing well.  She never took the sertraline and denies any depression or anxiety at this time.   Medications and allergies reviewed with patient and updated if appropriate.  Current Outpatient Medications on File Prior to Visit  Medication Sig Dispense Refill   acetaminophen (TYLENOL) 650 MG CR tablet Take 1,300 mg by mouth 2 (two) times daily as needed for pain.     AMBULATORY NON FORMULARY MEDICATION Take 1 drop by mouth daily as needed (pain). CBD Oil 1 dose under tongue once daily as needed for pain     amLODipine (NORVASC) 5 MG tablet Take 1 tablet (5 mg total) by mouth daily. 180 tablet 3   amoxicillin (AMOXIL) 500 MG capsule Take 500 mg by mouth 3 (three) times daily as needed (For gum flare up).     atenolol (TENORMIN) 50 MG tablet Take 1 tablet (50 mg total) by mouth daily. 90 tablet 3   Calcium Carbonate-Vitamin D (CALCIUM + D PO) Take 1 tablet by mouth daily.     cloNIDine (CATAPRES) 0.1 MG tablet Take 1 tablet (0.1 mg total) by mouth 3 (three) times daily. 270 tablet 3   dorzolamide-timolol (COSOPT) 2-0.5 % ophthalmic solution SMARTSIG:In Eye(s)     flecainide (TAMBOCOR) 50 MG tablet TAKE 1 TABLET BY MOUTH TWICE A DAY 180 tablet 3   hydrALAZINE (APRESOLINE) 100 MG tablet TAKE 1 TABLET BY MOUTH 3 TIMES DAILY. 270 tablet 3   latanoprost (XALATAN) 0.005 % ophthalmic solution SMARTSIG:In Eye(s)     levothyroxine (SYNTHROID) 50 MCG tablet TAKE 1 TABLET BY MOUTH EVERY DAY 90 tablet 3   linaclotide (LINZESS) 72 MCG capsule Take 72 mcg by mouth as needed (for constipation).     olmesartan (BENICAR) 40 MG tablet Take 1 tablet (40 mg total) by mouth daily. 30 tablet 11   XARELTO 15 MG TABS tablet  TAKE 1 TABLET BY MOUTH EVERY DAY WITH SUPPER 30 tablet 5   No current facility-administered medications on file prior to visit.    Review of Systems  Constitutional:  Negative for fever.  HENT:  Positive for postnasal drip (occ).   Eyes:  Negative for visual disturbance.  Respiratory:  Positive for cough (occ from PND). Negative for shortness of breath and wheezing.   Cardiovascular:  Negative for chest pain, palpitations and leg swelling.  Gastrointestinal:  Positive for constipation. Negative for abdominal pain, blood in stool and diarrhea.       No gerd  Genitourinary:  Negative for dysuria.  Musculoskeletal:  Positive for arthralgias (knees) and back pain.  Skin:  Negative for rash.  Neurological:  Negative for light-headedness and headaches.  Psychiatric/Behavioral:  Negative for dysphoric mood. The patient is not nervous/anxious.        Objective:   Vitals:   05/01/22 1345  BP: 126/76  Pulse: 70  Temp: 98.2 F (36.8 C)  SpO2: 98%   Filed Weights   05/01/22 1345  Weight: 121 lb 12.8 oz (55.2 kg)   Body mass index is 19.08 kg/m.  BP Readings from Last 3 Encounters:  05/01/22 126/76  04/19/22 126/78  04/15/22 110/62    Wt Readings from  Last 3 Encounters:  05/01/22 121 lb 12.8 oz (55.2 kg)  04/19/22 121 lb (54.9 kg)  04/15/22 123 lb 6.4 oz (56 kg)       Physical Exam Constitutional: She appears well-developed and well-nourished. No distress.  HENT:  Head: Normocephalic and atraumatic.  Right Ear: External ear normal. Normal ear canal and TM Left Ear: External ear normal.  Normal ear canal and TM Mouth/Throat: Oropharynx is clear and moist.  Eyes: Conjunctivae normal.  Neck: Neck supple. No tracheal deviation present. No thyromegaly present.  No carotid bruit  Cardiovascular: Normal rate, regular rhythm and normal heart sounds.   No murmur heard.  No edema. Pulmonary/Chest: Effort normal and breath sounds normal. No respiratory distress. She has no  wheezes. She has no rales.  Breast: deferred   Abdominal: Soft. She exhibits no distension. There is no tenderness.  Lymphadenopathy: She has no cervical adenopathy.  Skin: Skin is warm and dry. She is not diaphoretic.  Psychiatric: She has a normal mood and affect. Her behavior is normal.     Lab Results  Component Value Date   WBC 6.0 02/28/2022   HGB 9.5 (L) 02/28/2022   HCT 28.9 (L) 02/28/2022   PLT 336 02/28/2022   GLUCOSE 96 02/16/2022   CHOL 219 (H) 10/12/2020   TRIG 85 10/12/2020   HDL 95 10/12/2020   LDLDIRECT 84.9 11/15/2008   LDLCALC 109 (H) 10/12/2020   ALT 14 02/15/2022   AST 18 02/15/2022   NA 136 02/16/2022   K 4.1 02/16/2022   CL 106 02/16/2022   CREATININE 1.29 (H) 02/16/2022   BUN 20 02/16/2022   CO2 19 (L) 02/16/2022   TSH 2.962 02/15/2022   INR 1.4 (H) 03/28/2011         Assessment & Plan:   Physical exam: Screening blood work  ordered Exercise  better - doing some exercise from PT Weight  normal Substance abuse  none   Reviewed recommended immunizations.   Health Maintenance  Topic Date Due   DTaP/Tdap/Td (1 - Tdap) Never done   Medicare Annual Wellness (AWV)  06/27/2022   COVID-19 Vaccine (4 - 2023-24 season) 05/17/2022 (Originally 09/21/2021)   Zoster Vaccines- Shingrix (1 of 2) 07/31/2022 (Originally 07/27/1954)   Pneumonia Vaccine 67+ Years old (1 of 1 - PCV) 05/01/2023 (Originally 07/28/2002)   HPV VACCINES  Aged Out   INFLUENZA VACCINE  Discontinued   DEXA SCAN  Discontinued   COLONOSCOPY (Pts 45-71yrs Insurance coverage will need to be confirmed)  Discontinued          See Problem List for Assessment and Plan of chronic medical problems.

## 2022-04-30 NOTE — Patient Instructions (Addendum)
For the constipation: Docusate 1-3 pills a day Probiotics Metamucil fiber miralax - which you can take daily if needed Smooth move tea Prunes, prune juice, dates or raisons    Blood work was ordered.   The lab is on the first floor.    Medications changes include :   continue B12 orally      Return in about 1 year (around 05/01/2023) for Physical Exam.   Health Maintenance, Female Adopting a healthy lifestyle and getting preventive care are important in promoting health and wellness. Ask your health care provider about: The right schedule for you to have regular tests and exams. Things you can do on your own to prevent diseases and keep yourself healthy. What should I know about diet, weight, and exercise? Eat a healthy diet  Eat a diet that includes plenty of vegetables, fruits, low-fat dairy products, and lean protein. Do not eat a lot of foods that are high in solid fats, added sugars, or sodium. Maintain a healthy weight Body mass index (BMI) is used to identify weight problems. It estimates body fat based on height and weight. Your health care provider can help determine your BMI and help you achieve or maintain a healthy weight. Get regular exercise Get regular exercise. This is one of the most important things you can do for your health. Most adults should: Exercise for at least 150 minutes each week. The exercise should increase your heart rate and make you sweat (moderate-intensity exercise). Do strengthening exercises at least twice a week. This is in addition to the moderate-intensity exercise. Spend less time sitting. Even light physical activity can be beneficial. Watch cholesterol and blood lipids Have your blood tested for lipids and cholesterol at 85 years of age, then have this test every 5 years. Have your cholesterol levels checked more often if: Your lipid or cholesterol levels are high. You are older than 85 years of age. You are at high risk for  heart disease. What should I know about cancer screening? Depending on your health history and family history, you may need to have cancer screening at various ages. This may include screening for: Breast cancer. Cervical cancer. Colorectal cancer. Skin cancer. Lung cancer. What should I know about heart disease, diabetes, and high blood pressure? Blood pressure and heart disease High blood pressure causes heart disease and increases the risk of stroke. This is more likely to develop in people who have high blood pressure readings or are overweight. Have your blood pressure checked: Every 3-5 years if you are 26-56 years of age. Every year if you are 17 years old or older. Diabetes Have regular diabetes screenings. This checks your fasting blood sugar level. Have the screening done: Once every three years after age 64 if you are at a normal weight and have a low risk for diabetes. More often and at a younger age if you are overweight or have a high risk for diabetes. What should I know about preventing infection? Hepatitis B If you have a higher risk for hepatitis B, you should be screened for this virus. Talk with your health care provider to find out if you are at risk for hepatitis B infection. Hepatitis C Testing is recommended for: Everyone born from [birth year] through 1963. Anyone with known risk factors for hepatitis C. Sexually transmitted infections (STIs) Get screened for STIs, including gonorrhea and chlamydia, if: You are sexually active and are younger than 85 years of age. You are older than 85 years  of age and your health care provider tells you that you are at risk for this type of infection. Your sexual activity has changed since you were last screened, and you are at increased risk for chlamydia or gonorrhea. Ask your health care provider if you are at risk. Ask your health care provider about whether you are at high risk for HIV. Your health care provider may recommend a  prescription medicine to help prevent HIV infection. If you choose to take medicine to prevent HIV, you should first get tested for HIV. You should then be tested every 3 months for as long as you are taking the medicine. Pregnancy If you are about to stop having your period (premenopausal) and you may become pregnant, seek counseling before you get pregnant. Take 400 to 800 micrograms (mcg) of folic acid every day if you become pregnant. Ask for birth control (contraception) if you want to prevent pregnancy. Osteoporosis and menopause Osteoporosis is a disease in which the bones lose minerals and strength with aging. This can result in bone fractures. If you are 26 years old or older, or if you are at risk for osteoporosis and fractures, ask your health care provider if you should: Be screened for bone loss. Take a calcium or vitamin D supplement to lower your risk of fractures. Be given hormone replacement therapy (HRT) to treat symptoms of menopause. Follow these instructions at home: Alcohol use Do not drink alcohol if: Your health care provider tells you not to drink. You are pregnant, may be pregnant, or are planning to become pregnant. If you drink alcohol: Limit how much you have to: 0-1 drink a day. Know how much alcohol is in your drink. In the [place] one drink equals one 12 oz bottle of beer (355 mL), one 5 oz glass of wine (148 mL), or one 1 oz glass of hard liquor (44 mL). Lifestyle Do not use any products that contain nicotine or tobacco. These products include cigarettes, chewing tobacco, and vaping devices, such as e-cigarettes. If you need help quitting, ask your health care provider. Do not use street drugs. Do not share needles. Ask your health care provider for help if you need support or information about quitting drugs. General instructions Schedule regular health, dental, and eye exams. Stay current with your vaccines. Tell your health care provider if: You often  feel depressed. You have ever been abused or do not feel safe at home. Summary Adopting a healthy lifestyle and getting preventive care are important in promoting health and wellness. Follow your health care provider's instructions about healthy diet, exercising, and getting tested or screened for diseases. Follow your health care provider's instructions on monitoring your cholesterol and blood pressure. This information is not intended to replace advice given to you by your health care provider. Make sure you discuss any questions you have with your health care provider. Document Revised: 05/29/2020 Document Reviewed: 05/29/2020 Elsevier Patient Education  2023 [employer].

## 2022-05-01 ENCOUNTER — Ambulatory Visit (INDEPENDENT_AMBULATORY_CARE_PROVIDER_SITE_OTHER): Payer: Medicare PPO | Admitting: Internal Medicine

## 2022-05-01 VITALS — BP 126/76 | HR 70 | Temp 98.2°F | Ht 67.0 in | Wt 121.8 lb

## 2022-05-01 DIAGNOSIS — E538 Deficiency of other specified B group vitamins: Secondary | ICD-10-CM

## 2022-05-01 DIAGNOSIS — M810 Age-related osteoporosis without current pathological fracture: Secondary | ICD-10-CM

## 2022-05-01 DIAGNOSIS — K5904 Chronic idiopathic constipation: Secondary | ICD-10-CM

## 2022-05-01 DIAGNOSIS — F419 Anxiety disorder, unspecified: Secondary | ICD-10-CM | POA: Diagnosis not present

## 2022-05-01 DIAGNOSIS — D631 Anemia in chronic kidney disease: Secondary | ICD-10-CM | POA: Diagnosis not present

## 2022-05-01 DIAGNOSIS — I10 Essential (primary) hypertension: Secondary | ICD-10-CM

## 2022-05-01 DIAGNOSIS — Z0000 Encounter for general adult medical examination without abnormal findings: Secondary | ICD-10-CM

## 2022-05-01 DIAGNOSIS — N1832 Chronic kidney disease, stage 3b: Secondary | ICD-10-CM

## 2022-05-01 DIAGNOSIS — K5900 Constipation, unspecified: Secondary | ICD-10-CM | POA: Insufficient documentation

## 2022-05-01 DIAGNOSIS — I4891 Unspecified atrial fibrillation: Secondary | ICD-10-CM | POA: Diagnosis not present

## 2022-05-01 DIAGNOSIS — R739 Hyperglycemia, unspecified: Secondary | ICD-10-CM

## 2022-05-01 DIAGNOSIS — E039 Hypothyroidism, unspecified: Secondary | ICD-10-CM | POA: Diagnosis not present

## 2022-05-01 DIAGNOSIS — F32A Depression, unspecified: Secondary | ICD-10-CM

## 2022-05-01 LAB — CBC WITH DIFFERENTIAL/PLATELET
Basophils Absolute: 0.1 10*3/uL (ref 0.0–0.1)
Basophils Relative: 0.9 % (ref 0.0–3.0)
Eosinophils Absolute: 0.1 10*3/uL (ref 0.0–0.7)
Eosinophils Relative: 0.7 % (ref 0.0–5.0)
HCT: 36.4 % (ref 36.0–46.0)
Hemoglobin: 11.9 g/dL — ABNORMAL LOW (ref 12.0–15.0)
Lymphocytes Relative: 17 % (ref 12.0–46.0)
Lymphs Abs: 1.4 10*3/uL (ref 0.7–4.0)
MCHC: 32.8 g/dL (ref 30.0–36.0)
MCV: 99.7 fl (ref 78.0–100.0)
Monocytes Absolute: 1 10*3/uL (ref 0.1–1.0)
Monocytes Relative: 11.9 % (ref 3.0–12.0)
Neutro Abs: 5.6 10*3/uL (ref 1.4–7.7)
Neutrophils Relative %: 69.5 % (ref 43.0–77.0)
Platelets: 279 10*3/uL (ref 150.0–400.0)
RBC: 3.65 Mil/uL — ABNORMAL LOW (ref 3.87–5.11)
RDW: 14.1 % (ref 11.5–15.5)
WBC: 8 10*3/uL (ref 4.0–10.5)

## 2022-05-01 LAB — COMPREHENSIVE METABOLIC PANEL
ALT: 11 U/L (ref 0–35)
AST: 19 U/L (ref 0–37)
Albumin: 4.8 g/dL (ref 3.5–5.2)
Alkaline Phosphatase: 70 U/L (ref 39–117)
BUN: 24 mg/dL — ABNORMAL HIGH (ref 6–23)
CO2: 28 mEq/L (ref 19–32)
Calcium: 10.2 mg/dL (ref 8.4–10.5)
Chloride: 101 mEq/L (ref 96–112)
Creatinine, Ser: 1.6 mg/dL — ABNORMAL HIGH (ref 0.40–1.20)
GFR: 29.38 mL/min — ABNORMAL LOW (ref >60.00)
Glucose, Bld: 97 mg/dL (ref 70–99)
Potassium: 5.1 mEq/L (ref 3.5–5.1)
Sodium: 138 mEq/L (ref 135–145)
Total Bilirubin: 0.4 mg/dL (ref 0.2–1.2)
Total Protein: 8 g/dL (ref 6.0–8.3)

## 2022-05-01 LAB — IBC PANEL
Iron: 119 ug/dL (ref 42–145)
Saturation Ratios: 30.1 % (ref 20.0–50.0)
TIBC: 394.8 ug/dL (ref 250.0–450.0)
Transferrin: 282 mg/dL (ref 212.0–360.0)

## 2022-05-01 LAB — LIPID PANEL
Cholesterol: 196 mg/dL (ref 0–200)
HDL: 93.5 mg/dL (ref >39.00)
LDL Cholesterol: 83 mg/dL (ref 0–99)
NonHDL: 102.41
Total CHOL/HDL Ratio: 2
Triglycerides: 96 mg/dL (ref 0.0–149.0)
VLDL: 19.2 mg/dL (ref 0.0–40.0)

## 2022-05-01 LAB — TSH: TSH: 1.76 u[IU]/mL (ref 0.35–5.50)

## 2022-05-01 LAB — FERRITIN: Ferritin: 39.7 ng/mL (ref 10.0–291.0)

## 2022-05-01 LAB — VITAMIN D 25 HYDROXY (VIT D DEFICIENCY, FRACTURES): VITD: 49.29 ng/mL (ref 30.00–100.00)

## 2022-05-01 LAB — HEMOGLOBIN A1C: Hgb A1c MFr Bld: 4.8 % (ref 4.6–6.5)

## 2022-05-01 LAB — VITAMIN B12: Vitamin B-12: 844 pg/mL (ref 211–911)

## 2022-05-01 NOTE — Assessment & Plan Note (Signed)
Chronic Check TSH Continue levothyroxine 50 mcg daily

## 2022-05-01 NOTE — Assessment & Plan Note (Signed)
Chronic. 

## 2022-05-01 NOTE — Assessment & Plan Note (Signed)
Chronic Stable CMP, CBC, vitamin D today

## 2022-05-01 NOTE — Assessment & Plan Note (Signed)
Chronic Following with cardiology On Xarelto 50 mg daily, diltiazem and flecainide CBC, CMP, TSH

## 2022-05-01 NOTE — Assessment & Plan Note (Signed)
Chronic Check a1c Low sugar / carb diet Stressed regular exercise  

## 2022-05-01 NOTE — Assessment & Plan Note (Signed)
Chronic Uses the Linzess as needed at times, but it does cause diarrhea and needs to effective. Discussed other options such as MiraLAX, probiotics, dried fruit, smooth move tea Stressed increasing water intake

## 2022-05-01 NOTE — Assessment & Plan Note (Signed)
Denies any depression or anxiety-never took the sertraline Will monitor

## 2022-05-01 NOTE — Assessment & Plan Note (Signed)
Has gotten B12 injections - will stop them and continue oral B12 1000 mcg daily Check B12 level

## 2022-05-01 NOTE — Assessment & Plan Note (Signed)
Recent anemia likely with hospitalization CBC, iron panel

## 2022-05-01 NOTE — Assessment & Plan Note (Signed)
Chronic BP well controlled Management per cardiology Continue amlodipine 5 mg daily, atenolol 50 mg daily, clonidine 0.1 mg TID, hydralazine 100 mg TID, olmesartan 40 mg daily

## 2022-05-01 NOTE — Assessment & Plan Note (Signed)
Chronic Deferred further DEXA scans-she is not interested in any treatment Encourage regular exercise Continue calcium and vitamin D 

## 2022-05-02 ENCOUNTER — Other Ambulatory Visit: Payer: Self-pay | Admitting: Internal Medicine

## 2022-05-02 DIAGNOSIS — E039 Hypothyroidism, unspecified: Secondary | ICD-10-CM

## 2022-05-02 NOTE — Telephone Encounter (Signed)
Pt's pharmacy is requesting a refill on levothyroxine. Would Dr. Ross like to refill this medication? Please address °

## 2022-05-07 ENCOUNTER — Other Ambulatory Visit: Payer: Self-pay | Admitting: Internal Medicine

## 2022-05-16 ENCOUNTER — Encounter: Payer: Self-pay | Admitting: Internal Medicine

## 2022-05-22 ENCOUNTER — Ambulatory Visit: Payer: Medicare PPO

## 2022-05-27 ENCOUNTER — Ambulatory Visit (INDEPENDENT_AMBULATORY_CARE_PROVIDER_SITE_OTHER): Payer: Medicare PPO

## 2022-05-27 DIAGNOSIS — I4891 Unspecified atrial fibrillation: Secondary | ICD-10-CM | POA: Diagnosis not present

## 2022-05-27 LAB — CUP PACEART REMOTE DEVICE CHECK
Date Time Interrogation Session: 20240505231714
Implantable Pulse Generator Implant Date: 20240329

## 2022-06-13 DIAGNOSIS — H40012 Open angle with borderline findings, low risk, left eye: Secondary | ICD-10-CM | POA: Diagnosis not present

## 2022-06-13 DIAGNOSIS — H04123 Dry eye syndrome of bilateral lacrimal glands: Secondary | ICD-10-CM | POA: Diagnosis not present

## 2022-06-13 DIAGNOSIS — H35371 Puckering of macula, right eye: Secondary | ICD-10-CM | POA: Diagnosis not present

## 2022-06-13 DIAGNOSIS — H401111 Primary open-angle glaucoma, right eye, mild stage: Secondary | ICD-10-CM | POA: Diagnosis not present

## 2022-06-13 DIAGNOSIS — H26492 Other secondary cataract, left eye: Secondary | ICD-10-CM | POA: Diagnosis not present

## 2022-06-16 ENCOUNTER — Encounter (HOSPITAL_COMMUNITY): Payer: Self-pay | Admitting: Emergency Medicine

## 2022-06-16 ENCOUNTER — Emergency Department (HOSPITAL_COMMUNITY): Payer: Medicare PPO

## 2022-06-16 ENCOUNTER — Emergency Department (HOSPITAL_COMMUNITY)
Admission: EM | Admit: 2022-06-16 | Discharge: 2022-06-16 | Disposition: A | Discharge status: Home / Self Care | Payer: Medicare PPO | Source: Home / Self Care | Attending: Emergency Medicine | Admitting: Emergency Medicine

## 2022-06-16 ENCOUNTER — Other Ambulatory Visit: Payer: Self-pay

## 2022-06-16 DIAGNOSIS — S0990XA Unspecified injury of head, initial encounter: Secondary | ICD-10-CM | POA: Insufficient documentation

## 2022-06-16 DIAGNOSIS — E1143 Type 2 diabetes mellitus with diabetic autonomic (poly)neuropathy: Secondary | ICD-10-CM | POA: Diagnosis not present

## 2022-06-16 DIAGNOSIS — S32511A Fracture of superior rim of right pubis, initial encounter for closed fracture: Secondary | ICD-10-CM | POA: Diagnosis not present

## 2022-06-16 DIAGNOSIS — M1711 Unilateral primary osteoarthritis, right knee: Secondary | ICD-10-CM | POA: Diagnosis not present

## 2022-06-16 DIAGNOSIS — S32591D Other specified fracture of right pubis, subsequent encounter for fracture with routine healing: Secondary | ICD-10-CM | POA: Diagnosis not present

## 2022-06-16 DIAGNOSIS — Z79890 Hormone replacement therapy (postmenopausal): Secondary | ICD-10-CM | POA: Diagnosis not present

## 2022-06-16 DIAGNOSIS — W19XXXA Unspecified fall, initial encounter: Secondary | ICD-10-CM | POA: Diagnosis not present

## 2022-06-16 DIAGNOSIS — S32591A Other specified fracture of right pubis, initial encounter for closed fracture: Secondary | ICD-10-CM | POA: Diagnosis present

## 2022-06-16 DIAGNOSIS — D6489 Other specified anemias: Secondary | ICD-10-CM | POA: Diagnosis not present

## 2022-06-16 DIAGNOSIS — S3210XA Unspecified fracture of sacrum, initial encounter for closed fracture: Secondary | ICD-10-CM | POA: Insufficient documentation

## 2022-06-16 DIAGNOSIS — S79919A Unspecified injury of unspecified hip, initial encounter: Secondary | ICD-10-CM | POA: Diagnosis not present

## 2022-06-16 DIAGNOSIS — J168 Pneumonia due to other specified infectious organisms: Secondary | ICD-10-CM | POA: Diagnosis not present

## 2022-06-16 DIAGNOSIS — R079 Chest pain, unspecified: Secondary | ICD-10-CM | POA: Diagnosis not present

## 2022-06-16 DIAGNOSIS — Z1152 Encounter for screening for COVID-19: Secondary | ICD-10-CM | POA: Diagnosis not present

## 2022-06-16 DIAGNOSIS — R296 Repeated falls: Secondary | ICD-10-CM | POA: Diagnosis present

## 2022-06-16 DIAGNOSIS — I152 Hypertension secondary to endocrine disorders: Secondary | ICD-10-CM | POA: Diagnosis not present

## 2022-06-16 DIAGNOSIS — R911 Solitary pulmonary nodule: Secondary | ICD-10-CM | POA: Diagnosis not present

## 2022-06-16 DIAGNOSIS — M25572 Pain in left ankle and joints of left foot: Secondary | ICD-10-CM | POA: Diagnosis not present

## 2022-06-16 DIAGNOSIS — R140 Abdominal distension (gaseous): Secondary | ICD-10-CM | POA: Diagnosis not present

## 2022-06-16 DIAGNOSIS — I129 Hypertensive chronic kidney disease with stage 1 through stage 4 chronic kidney disease, or unspecified chronic kidney disease: Secondary | ICD-10-CM | POA: Diagnosis present

## 2022-06-16 DIAGNOSIS — I480 Paroxysmal atrial fibrillation: Secondary | ICD-10-CM | POA: Diagnosis not present

## 2022-06-16 DIAGNOSIS — M25551 Pain in right hip: Secondary | ICD-10-CM | POA: Diagnosis not present

## 2022-06-16 DIAGNOSIS — Z8249 Family history of ischemic heart disease and other diseases of the circulatory system: Secondary | ICD-10-CM | POA: Diagnosis not present

## 2022-06-16 DIAGNOSIS — R739 Hyperglycemia, unspecified: Secondary | ICD-10-CM | POA: Diagnosis not present

## 2022-06-16 DIAGNOSIS — M1990 Unspecified osteoarthritis, unspecified site: Secondary | ICD-10-CM | POA: Diagnosis present

## 2022-06-16 DIAGNOSIS — Z7901 Long term (current) use of anticoagulants: Secondary | ICD-10-CM | POA: Insufficient documentation

## 2022-06-16 DIAGNOSIS — E1122 Type 2 diabetes mellitus with diabetic chronic kidney disease: Secondary | ICD-10-CM | POA: Diagnosis present

## 2022-06-16 DIAGNOSIS — N1831 Chronic kidney disease, stage 3a: Secondary | ICD-10-CM | POA: Diagnosis not present

## 2022-06-16 DIAGNOSIS — M25561 Pain in right knee: Secondary | ICD-10-CM | POA: Diagnosis present

## 2022-06-16 DIAGNOSIS — N1832 Chronic kidney disease, stage 3b: Secondary | ICD-10-CM | POA: Diagnosis not present

## 2022-06-16 DIAGNOSIS — J189 Pneumonia, unspecified organism: Secondary | ICD-10-CM | POA: Diagnosis not present

## 2022-06-16 DIAGNOSIS — E1159 Type 2 diabetes mellitus with other circulatory complications: Secondary | ICD-10-CM | POA: Diagnosis not present

## 2022-06-16 DIAGNOSIS — S32511D Fracture of superior rim of right pubis, subsequent encounter for fracture with routine healing: Secondary | ICD-10-CM | POA: Diagnosis not present

## 2022-06-16 DIAGNOSIS — S32110D Nondisplaced Zone I fracture of sacrum, subsequent encounter for fracture with routine healing: Secondary | ICD-10-CM | POA: Diagnosis not present

## 2022-06-16 DIAGNOSIS — Y9301 Activity, walking, marching and hiking: Secondary | ICD-10-CM | POA: Diagnosis present

## 2022-06-16 DIAGNOSIS — E039 Hypothyroidism, unspecified: Secondary | ICD-10-CM | POA: Diagnosis present

## 2022-06-16 DIAGNOSIS — E1165 Type 2 diabetes mellitus with hyperglycemia: Secondary | ICD-10-CM | POA: Diagnosis present

## 2022-06-16 DIAGNOSIS — I4891 Unspecified atrial fibrillation: Secondary | ICD-10-CM | POA: Diagnosis present

## 2022-06-16 DIAGNOSIS — R2689 Other abnormalities of gait and mobility: Secondary | ICD-10-CM | POA: Diagnosis not present

## 2022-06-16 DIAGNOSIS — M25511 Pain in right shoulder: Secondary | ICD-10-CM | POA: Diagnosis not present

## 2022-06-16 DIAGNOSIS — W0110XA Fall on same level from slipping, tripping and stumbling with subsequent striking against unspecified object, initial encounter: Secondary | ICD-10-CM | POA: Insufficient documentation

## 2022-06-16 DIAGNOSIS — M25461 Effusion, right knee: Secondary | ICD-10-CM | POA: Diagnosis not present

## 2022-06-16 DIAGNOSIS — J90 Pleural effusion, not elsewhere classified: Secondary | ICD-10-CM | POA: Diagnosis not present

## 2022-06-16 DIAGNOSIS — Z85828 Personal history of other malignant neoplasm of skin: Secondary | ICD-10-CM | POA: Diagnosis not present

## 2022-06-16 DIAGNOSIS — D631 Anemia in chronic kidney disease: Secondary | ICD-10-CM | POA: Diagnosis present

## 2022-06-16 DIAGNOSIS — I10 Essential (primary) hypertension: Secondary | ICD-10-CM | POA: Diagnosis not present

## 2022-06-16 DIAGNOSIS — N183 Chronic kidney disease, stage 3 unspecified: Secondary | ICD-10-CM | POA: Diagnosis present

## 2022-06-16 DIAGNOSIS — W1809XA Striking against other object with subsequent fall, initial encounter: Secondary | ICD-10-CM | POA: Diagnosis present

## 2022-06-16 DIAGNOSIS — Z9851 Tubal ligation status: Secondary | ICD-10-CM | POA: Diagnosis not present

## 2022-06-16 DIAGNOSIS — Z79899 Other long term (current) drug therapy: Secondary | ICD-10-CM | POA: Diagnosis not present

## 2022-06-16 LAB — TYPE AND SCREEN
ABO/RH(D): A NEG
Antibody Screen: NEGATIVE

## 2022-06-16 LAB — CBC WITH DIFFERENTIAL/PLATELET
Abs Immature Granulocytes: 0.11 10*3/uL — ABNORMAL HIGH (ref 0.00–0.07)
Basophils Absolute: 0.1 10*3/uL (ref 0.0–0.1)
Basophils Relative: 1 %
Eosinophils Absolute: 0 10*3/uL (ref 0.0–0.5)
Eosinophils Relative: 0 %
HCT: 36.7 % (ref 36.0–46.0)
Hemoglobin: 11.8 g/dL — ABNORMAL LOW (ref 12.0–15.0)
Immature Granulocytes: 1 %
Lymphocytes Relative: 11 %
Lymphs Abs: 0.9 10*3/uL (ref 0.7–4.0)
MCH: 33 pg (ref 26.0–34.0)
MCHC: 32.2 g/dL (ref 30.0–36.0)
MCV: 102.5 fL — ABNORMAL HIGH (ref 80.0–100.0)
Monocytes Absolute: 0.7 10*3/uL (ref 0.1–1.0)
Monocytes Relative: 9 %
Neutro Abs: 6.8 10*3/uL (ref 1.7–7.7)
Neutrophils Relative %: 78 %
Platelets: 281 10*3/uL (ref 150–400)
RBC: 3.58 MIL/uL — ABNORMAL LOW (ref 3.87–5.11)
RDW: 13.9 % (ref 11.5–15.5)
WBC: 8.6 10*3/uL (ref 4.0–10.5)
nRBC: 0 % (ref 0.0–0.2)

## 2022-06-16 LAB — PROTIME-INR
INR: 1 (ref 0.8–1.2)
Prothrombin Time: 13.5 seconds (ref 11.4–15.2)

## 2022-06-16 LAB — COMPREHENSIVE METABOLIC PANEL
ALT: 20 U/L (ref 0–44)
AST: 21 U/L (ref 15–41)
Albumin: 4.7 g/dL (ref 3.5–5.0)
Alkaline Phosphatase: 66 U/L (ref 38–126)
Anion gap: 14 (ref 5–15)
BUN: 28 mg/dL — ABNORMAL HIGH (ref 8–23)
CO2: 19 mmol/L — ABNORMAL LOW (ref 22–32)
Calcium: 9.9 mg/dL (ref 8.9–10.3)
Chloride: 101 mmol/L (ref 98–111)
Creatinine, Ser: 1.25 mg/dL — ABNORMAL HIGH (ref 0.44–1.00)
GFR, Estimated: 43 mL/min — ABNORMAL LOW (ref >60)
Glucose, Bld: 112 mg/dL — ABNORMAL HIGH (ref 70–99)
Potassium: 3.9 mmol/L (ref 3.5–5.1)
Sodium: 134 mmol/L — ABNORMAL LOW (ref 135–145)
Total Bilirubin: 0.7 mg/dL (ref 0.3–1.2)
Total Protein: 8.6 g/dL — ABNORMAL HIGH (ref 6.5–8.1)

## 2022-06-16 MED ORDER — OXYCODONE-ACETAMINOPHEN 5-325 MG PO TABS
1.0000 | ORAL_TABLET | Freq: Once | ORAL | Status: AC
  Administered 2022-06-16: 1 via ORAL
  Filled 2022-06-16: qty 1

## 2022-06-16 MED ORDER — SODIUM CHLORIDE 0.9 % IV BOLUS
500.0000 mL | Freq: Once | INTRAVENOUS | Status: AC
  Administered 2022-06-16: 500 mL via INTRAVENOUS

## 2022-06-16 MED ORDER — OXYCODONE-ACETAMINOPHEN 5-325 MG PO TABS: 1.0000 | ORAL_TABLET | Freq: Four times a day (QID) | ORAL | 0 refills | Status: DC | PRN

## 2022-06-16 MED ORDER — CLONIDINE HCL 0.1 MG PO TABS
0.1000 mg | ORAL_TABLET | Freq: Once | ORAL | Status: AC
  Administered 2022-06-16: 0.1 mg via ORAL
  Filled 2022-06-16: qty 1

## 2022-06-16 NOTE — ED Provider Notes (Signed)
[place] EMERGENCY DEPARTMENT AT [facility] Provider Note   CSN: [id number] Arrival date & time: 06/16/22  1150     History  Chief Complaint  Patient presents with   Amy [name] is a 87 y.o. female.  She is here after mechanical fall while walking out of church.  She said she tripped on the carpet and fell down hitting her head.  Complaining of right hip pain right knee pain.  She is on Eliquis.  She denies any loss of consciousness no neck pain no numbness or weakness no chest pain or shortness of breath.  The history is provided by the patient.  Fall This is a new problem. The problem has not changed since onset.Associated symptoms include headaches. Pertinent negatives include no chest pain, no abdominal pain and no shortness of breath. Associated symptoms comments: Right hip and knee. The symptoms are aggravated by bending and twisting. Nothing relieves the symptoms. She has tried nothing for the symptoms. The treatment provided no relief.       Home Medications Prior to Admission medications   Medication Sig Start Date End Date Taking? Authorizing Provider  acetaminophen (TYLENOL) 650 MG CR tablet Take 1,300 mg by mouth 2 (two) times daily as needed for pain.    [provider]  AMBULATORY NON FORMULARY MEDICATION Take 1 drop by mouth daily as needed (pain). CBD Oil 1 dose under tongue once daily as needed for pain    [provider]  amLODipine (NORVASC) 5 MG tablet Take 1 tablet (5 mg total) by mouth daily. 01/24/22   Swinyer, Zachary George, NP  amoxicillin (AMOXIL) 500 MG capsule Take 500 mg by mouth 3 (three) times daily as needed (For gum flare up).    [provider]  atenolol (TENORMIN) 50 MG tablet Take 1 tablet (50 mg total) by mouth daily. 01/04/22   Pricilla Riffle, MD  Calcium Carbonate-Vitamin D (CALCIUM + D PO) Take 1 tablet by mouth daily.    [provider]  cloNIDine (CATAPRES) 0.1 MG tablet Take 1 tablet  (0.1 mg total) by mouth 3 (three) times daily. 02/04/22   Pricilla Riffle, MD  dorzolamide-timolol (COSOPT) 2-0.5 % ophthalmic solution SMARTSIG:In Eye(s) 04/25/22   [provider]  flecainide (TAMBOCOR) 50 MG tablet TAKE 1 TABLET BY MOUTH TWICE A DAY 05/02/22   Pricilla Riffle, MD  hydrALAZINE (APRESOLINE) 100 MG tablet TAKE 1 TABLET BY MOUTH 3 TIMES DAILY. 12/18/21   Pricilla Riffle, MD  latanoprost (XALATAN) 0.005 % ophthalmic solution SMARTSIG:In Eye(s) 04/09/22   [provider]  levothyroxine (SYNTHROID) 50 MCG tablet TAKE 1 TABLET BY MOUTH EVERY DAY 05/02/22   Pricilla Riffle, MD  linaclotide [facility]) 72 MCG capsule Take 72 mcg by mouth as needed (for constipation).    [provider]  olmesartan (BENICAR) 40 MG tablet Take 1 tablet (40 mg total) by mouth daily. 01/24/22   Swinyer, Zachary George, NP  XARELTO 15 MG TABS tablet TAKE 1 TABLET BY MOUTH EVERY DAY WITH SUPPER 01/28/22   Pricilla Riffle, MD      Allergies    Patient has no known allergies.    Review of Systems   Review of Systems  Constitutional:  Negative for fever.  HENT:  Negative for sore throat.   Respiratory:  Negative for shortness of breath.   Cardiovascular:  Negative for chest pain.  Gastrointestinal:  Negative for abdominal pain.  Genitourinary:  Negative for  dysuria.  Skin:  Negative for rash.  Neurological:  Positive for headaches.    Physical Exam Updated Vital Signs BP (!) 147/72 (BP Location: Right Arm)   Pulse 85   Temp 97.9 F (36.6 C)   Resp 19   Ht 5\' 7"  (1.702 m)   Wt 54.4 kg   LMP  (LMP Unknown)   SpO2 95%   BMI 18.79 kg/m  Physical Exam Vitals and nursing note reviewed.  Constitutional:      General: She is not in acute distress.    Appearance: Normal appearance. She is well-developed.  HENT:     Head: Normocephalic and atraumatic.  Eyes:     Conjunctiva/sclera: Conjunctivae normal.  Cardiovascular:     Rate and Rhythm: Normal rate and regular rhythm.     Heart sounds: No  murmur heard. Pulmonary:     Effort: Pulmonary effort is normal. No respiratory distress.     Breath sounds: Normal breath sounds.  Abdominal:     Palpations: Abdomen is soft.     Tenderness: There is no abdominal tenderness. There is no guarding or rebound.  Musculoskeletal:        General: Tenderness present. No deformity. Normal range of motion.     Cervical back: Neck supple.     Right lower leg: No edema.     Left lower leg: No edema.     Comments: Full range of motion of upper extremities without a pain or limitation.  No neck or back pain.  She has some tenderness about her right hip.  She does have normal internal/external rotation and no shortening.  There is some minor knee pain on the right.  Left lower extremity full range of motion without any pain or limitations.  Distal pulses motor sensation intact.  Skin:    General: Skin is warm and dry.     Capillary Refill: Capillary refill takes less than 2 seconds.  Neurological:     General: No focal deficit present.     Mental Status: She is alert.     Sensory: No sensory deficit.     Motor: No weakness.     ED Results / Procedures / Treatments   Labs (all labs ordered are listed, but only abnormal results are displayed) Labs Reviewed  CBC WITH DIFFERENTIAL/PLATELET - Abnormal; Notable for the following components:      Result Value   RBC 3.58 (*)    Hemoglobin 11.8 (*)    MCV 102.5 (*)    Abs Immature Granulocytes 0.11 (*)    All other components within normal limits  COMPREHENSIVE METABOLIC PANEL - Abnormal; Notable for the following components:   Sodium 134 (*)    CO2 19 (*)    Glucose, Bld 112 (*)    BUN 28 (*)    Creatinine, Ser 1.25 (*)    Total Protein 8.6 (*)    GFR, Estimated 43 (*)    All other components within normal limits  PROTIME-INR  TYPE AND SCREEN    EKG None  Radiology CT PELVIS WO CONTRAST  Result Date: 06/16/2022 CLINICAL DATA:  Hip trauma, mechanical fall, tripped on carpet and landed  on her right hip. EXAM: CT PELVIS WITHOUT CONTRAST TECHNIQUE: Multidetector CT imaging of the pelvis was performed following the standard protocol without intravenous contrast. RADIATION DOSE REDUCTION: This exam was performed according to the departmental dose-optimization program which includes automated exposure control, adjustment of the mA and/or kV according to patient size and/or use of iterative  reconstruction technique. COMPARISON:  None Available. FINDINGS: Urinary Tract:  No abnormality visualized. Bowel: Advanced sigmoid colonic diverticulosis without evidence of acute diverticulitis. Vascular/Lymphatic: No pathologically enlarged lymph nodes. No significant vascular abnormality seen. Reproductive:  No mass or other significant abnormality Other:  None. Musculoskeletal: There is a nondisplaced fracture of the right superior pubic ramus. There is also nondisplaced fracture of the right sacral ala. Sacroiliac joints and pubic symphysis are intact. No appreciable femoral neck fracture. Muscles of the flexor, extensor and adductor compartments appear intact. No evidence of tendon tear. Skin and subcutaneous soft tissues are within normal limits. IMPRESSION: 1. Nondisplaced fracture of the right superior pubic ramus. 2. Nondisplaced fracture of the right sacral ala. 3. No evidence of femoral neck fracture. 4. Advanced sigmoid colonic diverticulosis without evidence of acute diverticulitis. Electronically Signed   By: Larose Hires D.O.   On: 06/16/2022 14:07   CT Head Wo Contrast  Result Date: 06/16/2022 CLINICAL DATA:  Head trauma. Mechanical fall tripped on carpet and landed on right hip. EXAM: CT HEAD WITHOUT CONTRAST TECHNIQUE: Contiguous axial images were obtained from the base of the skull through the vertex without intravenous contrast. RADIATION DOSE REDUCTION: This exam was performed according to the departmental dose-optimization program which includes automated exposure control, adjustment of  the mA and/or kV according to patient size and/or use of iterative reconstruction technique. COMPARISON:  CT head dated February 14, 2018 FINDINGS: Brain: No evidence of acute infarction, hemorrhage, hydrocephalus, extra-axial collection or mass lesion/mass effect. Vascular: No hyperdense vessel or unexpected calcification. Skull: Normal. Negative for fracture or focal lesion. Sinuses/Orbits: No acute finding. Other: None. IMPRESSION: No acute intracranial pathology. Electronically Signed   By: Larose Hires D.O.   On: 06/16/2022 13:58   DG Chest Port 1 View  Result Date: 06/16/2022 CLINICAL DATA:  fall pain EXAM: PORTABLE CHEST - 1 VIEW COMPARISON:  None Available. FINDINGS: Mild pulmonary hyperinflation. Ill-defined airspace opacities at the left lung base. Coarse interstitial opacities the right lung base. Heart size upper normal. Aortic Atherosclerosis (ICD10-170.0). Monitoring device overlies the mid left chest. No effusion.  No pneumothorax. Mild thoracolumbar dextroscoliosis. IMPRESSION: Ill-defined left lower lobe airspace disease. Electronically Signed   By: Corlis Leak M.D.   On: 06/16/2022 13:32   DG Hip Unilat  With Pelvis 2-3 Views Right  Result Date: 06/16/2022 CLINICAL DATA:  Fall.  Patient landed on the right hip.  Pain. EXAM: DG HIP (WITH OR WITHOUT PELVIS) 2-3V RIGHT COMPARISON:  No comparison studies available. FINDINGS: Bones are diffusely demineralized. No evidence for an acute fracture in the bony pelvis. SI joints and symphysis pubis unremarkable. AP and frog-leg lateral views of the right hip show no evidence for femoral neck fracture. Radiopaque foreign body projecting over the right femoral head is probably external to the patient. IMPRESSION: 1. No acute bony findings. 2. Radiopaque foreign body projecting over the right femoral head is probably external to the patient. Electronically Signed   By: Kennith Center M.D.   On: 06/16/2022 13:22   DG Knee 2 Views Right  Result Date:  06/16/2022 CLINICAL DATA:  Fall. Patient tripped and landed on right hip and knee. EXAM: RIGHT KNEE - 1-2 VIEW COMPARISON:  Single view right knee 04/04/2017 FINDINGS: Bones are diffusely demineralized. Loss of joint space with spurring in the lateral compartment is consistent with degenerative change. Stable appearance of bone infarct or enchondroma in the tibial metaphysis. Small joint effusion noted on lateral projection. No worrisome lytic or sclerotic osseous abnormality.  IMPRESSION: 1. No acute bony findings. 2. Degenerative changes in the lateral compartment with small joint effusion. Electronically Signed   By: Kennith Center M.D.   On: 06/16/2022 13:20    Procedures Procedures    Medications Ordered in ED Medications  sodium chloride 0.9 % bolus 500 mL (0 mLs Intravenous Stopped 06/16/22 1526)  oxyCODONE-acetaminophen (PERCOCET/ROXICET) 5-325 MG per tablet 1 tablet (1 tablet Oral Given 06/16/22 1448)  cloNIDine (CATAPRES) tablet 0.1 mg (0.1 mg Oral Given 06/16/22 1448)    ED Course/ Medical Decision Making/ A&P Clinical Course as of 06/16/22 1836  Sun Jun 16, 2022  1320 X-rays of chest right knee and right hip and pelvis do not show any definite fracture.  Awaiting radiology reading. [MB]  1519 Discussed with orthopedics Dr. Lesli Albee.  He said if she can weight-bear partial using walker she would be appropriate for discharge and outpatient follow-up. [MB]  1543 Patient was able to ambulate a bit with a walker.  She would rather try to go home and see how she does.  Will send a prescription for pain medicine and she will follow-up with orthopedics.  Return instructions discussed [MB]    Clinical Course User Index [MB] Terrilee Files, MD                             Medical Decision Making Amount and/or Complexity of Data Reviewed Labs: ordered. Radiology: ordered.  Risk Prescription drug management.   This patient complains of fall head injury right hip pain right knee  pain; this involves an extensive number of treatment Options and is a complaint that carries with it a high risk of complications and morbidity. The differential includes fracture, contusion, dislocation, bleed, stroke, metabolic derangement  I ordered, reviewed and interpreted labs, which included CBC with normal white count, hemoglobin low stable from priors, chemistries with low bicarb, improved creatinine from priors, I ordered medication IV fluids oral pain medication oral blood pressure medication and reviewed PMP when indicated. I ordered imaging studies which included x-rays of chest and right hip right knee, CAT scan of head and pelvis and I independently    visualized and interpreted imaging which showed pelvic rami fracture sacral ala fracture Additional history obtained from patient's husband Previous records obtained and reviewed in epic including recent orthopedic notes I consulted orthopedics Dr. Odis Hollingshead and discussed lab and imaging findings and discussed disposition.  Cardiac monitoring reviewed, sinus rhythm Social determinants considered, no significant barriers Critical Interventions: None  After the interventions stated above, I reevaluated the patient and found patient to be ambulatory although with moderate pain Admission and further testing considered, she was offered admission to the hospital for further pain control and likely physical therapy, consideration for SNF.  Patient declines admission and would rather go home.  She will follow-up with orthopedics as outpatient.  She understands to return if any worsening or concerning symptoms.  Will provide walker.         Final Clinical Impression(s) / ED Diagnoses Final diagnoses:  Fall, initial encounter  Pubic ramus fracture, right, closed, initial encounter (HCC)  Closed fracture of sacrum, unspecified fracture morphology, initial encounter [facility])    Rx / DC Orders ED Discharge Orders     None          Terrilee Files, MD 06/16/22 1839

## 2022-06-16 NOTE — Discharge Instructions (Signed)
You were seen in the emergency department for fall left hip pain.  You had a CAT scan of your head and CAT scan of your pelvis.  There was no evidence of a hip fracture but you did fracture your pelvic ramus which is a bone in your pelvis.  This is usually not operative.  We are prescribing you some narcotic pain medicine to help with your pain.  Please use caution as this may make you constipated nauseous dizzy.  You may use the walker, weightbearing as tolerated.  Follow-up with orthopedics within 1 week.  Return to the emergency department if any worsening or concerning symptoms.

## 2022-06-16 NOTE — ED Triage Notes (Addendum)
Pt via POV with husband after pt sustained a mechanical fall while leaving church. She tripped on the carpet and landed on her right hip and knee and has a hematoma to right forehead; she takes eliquis for a fib. Pt is unable to tolerate weight bearing and thinks she may have broken her hip. Pt has osteoporosis, no prior surgeries. Last PO intake 0800 this morning; breakfast bar and ginger ale. A/O x 4 on arrival

## 2022-06-16 NOTE — ED Notes (Signed)
Pt ambulated with walker. Range of motion and weight bearing was normal. Pain level increased significantly. Patient walked a total of 25'.

## 2022-06-18 ENCOUNTER — Inpatient Hospital Stay (HOSPITAL_COMMUNITY)
Admission: EM | Admit: 2022-06-18 | Discharge: 2022-06-20 | DRG: 194 | Disposition: A | Discharge status: Skilled Nursing Facility | Payer: Medicare PPO | Attending: Family Medicine | Admitting: Family Medicine

## 2022-06-18 ENCOUNTER — Emergency Department (HOSPITAL_COMMUNITY): Payer: Medicare PPO

## 2022-06-18 ENCOUNTER — Other Ambulatory Visit: Payer: Self-pay

## 2022-06-18 ENCOUNTER — Encounter (HOSPITAL_COMMUNITY): Payer: Self-pay | Admitting: *Deleted

## 2022-06-18 DIAGNOSIS — R739 Hyperglycemia, unspecified: Secondary | ICD-10-CM | POA: Diagnosis present

## 2022-06-18 DIAGNOSIS — S32591D Other specified fracture of right pubis, subsequent encounter for fracture with routine healing: Secondary | ICD-10-CM

## 2022-06-18 DIAGNOSIS — E039 Hypothyroidism, unspecified: Secondary | ICD-10-CM | POA: Diagnosis present

## 2022-06-18 DIAGNOSIS — I4891 Unspecified atrial fibrillation: Secondary | ICD-10-CM | POA: Diagnosis not present

## 2022-06-18 DIAGNOSIS — S3210XA Unspecified fracture of sacrum, initial encounter for closed fracture: Secondary | ICD-10-CM | POA: Diagnosis present

## 2022-06-18 DIAGNOSIS — Z7901 Long term (current) use of anticoagulants: Secondary | ICD-10-CM

## 2022-06-18 DIAGNOSIS — W1809XA Striking against other object with subsequent fall, initial encounter: Secondary | ICD-10-CM | POA: Diagnosis present

## 2022-06-18 DIAGNOSIS — J189 Pneumonia, unspecified organism: Principal | ICD-10-CM | POA: Diagnosis present

## 2022-06-18 DIAGNOSIS — Z85828 Personal history of other malignant neoplasm of skin: Secondary | ICD-10-CM

## 2022-06-18 DIAGNOSIS — M25551 Pain in right hip: Principal | ICD-10-CM

## 2022-06-18 DIAGNOSIS — N1832 Chronic kidney disease, stage 3b: Secondary | ICD-10-CM | POA: Diagnosis not present

## 2022-06-18 DIAGNOSIS — I129 Hypertensive chronic kidney disease with stage 1 through stage 4 chronic kidney disease, or unspecified chronic kidney disease: Secondary | ICD-10-CM | POA: Diagnosis present

## 2022-06-18 DIAGNOSIS — M1990 Unspecified osteoarthritis, unspecified site: Secondary | ICD-10-CM | POA: Diagnosis present

## 2022-06-18 DIAGNOSIS — Z8249 Family history of ischemic heart disease and other diseases of the circulatory system: Secondary | ICD-10-CM

## 2022-06-18 DIAGNOSIS — Z1152 Encounter for screening for COVID-19: Secondary | ICD-10-CM

## 2022-06-18 DIAGNOSIS — E1122 Type 2 diabetes mellitus with diabetic chronic kidney disease: Secondary | ICD-10-CM | POA: Diagnosis present

## 2022-06-18 DIAGNOSIS — M25561 Pain in right knee: Secondary | ICD-10-CM | POA: Diagnosis present

## 2022-06-18 DIAGNOSIS — Z79890 Hormone replacement therapy (postmenopausal): Secondary | ICD-10-CM

## 2022-06-18 DIAGNOSIS — I10 Essential (primary) hypertension: Secondary | ICD-10-CM | POA: Diagnosis not present

## 2022-06-18 DIAGNOSIS — Z79899 Other long term (current) drug therapy: Secondary | ICD-10-CM

## 2022-06-18 DIAGNOSIS — R296 Repeated falls: Secondary | ICD-10-CM | POA: Diagnosis present

## 2022-06-18 DIAGNOSIS — S329XXA Fracture of unspecified parts of lumbosacral spine and pelvis, initial encounter for closed fracture: Secondary | ICD-10-CM | POA: Diagnosis present

## 2022-06-18 DIAGNOSIS — E1165 Type 2 diabetes mellitus with hyperglycemia: Secondary | ICD-10-CM | POA: Diagnosis present

## 2022-06-18 DIAGNOSIS — N183 Chronic kidney disease, stage 3 unspecified: Secondary | ICD-10-CM | POA: Diagnosis present

## 2022-06-18 DIAGNOSIS — D631 Anemia in chronic kidney disease: Secondary | ICD-10-CM | POA: Diagnosis present

## 2022-06-18 DIAGNOSIS — Z9851 Tubal ligation status: Secondary | ICD-10-CM

## 2022-06-18 DIAGNOSIS — S32591A Other specified fracture of right pubis, initial encounter for closed fracture: Secondary | ICD-10-CM | POA: Diagnosis present

## 2022-06-18 DIAGNOSIS — Y9301 Activity, walking, marching and hiking: Secondary | ICD-10-CM | POA: Diagnosis present

## 2022-06-18 LAB — CBC WITH DIFFERENTIAL/PLATELET
Abs Immature Granulocytes: 0.13 10*3/uL — ABNORMAL HIGH (ref 0.00–0.07)
Basophils Absolute: 0 10*3/uL (ref 0.0–0.1)
Basophils Relative: 0 %
Eosinophils Absolute: 0 10*3/uL (ref 0.0–0.5)
Eosinophils Relative: 0 %
HCT: 30.9 % — ABNORMAL LOW (ref 36.0–46.0)
Hemoglobin: 10 g/dL — ABNORMAL LOW (ref 12.0–15.0)
Immature Granulocytes: 1 %
Lymphocytes Relative: 4 %
Lymphs Abs: 0.4 10*3/uL — ABNORMAL LOW (ref 0.7–4.0)
MCH: 32.9 pg (ref 26.0–34.0)
MCHC: 32.4 g/dL (ref 30.0–36.0)
MCV: 101.6 fL — ABNORMAL HIGH (ref 80.0–100.0)
Monocytes Absolute: 1.3 10*3/uL — ABNORMAL HIGH (ref 0.1–1.0)
Monocytes Relative: 11 %
Neutro Abs: 10.5 10*3/uL — ABNORMAL HIGH (ref 1.7–7.7)
Neutrophils Relative %: 84 %
Platelets: 185 10*3/uL (ref 150–400)
RBC: 3.04 MIL/uL — ABNORMAL LOW (ref 3.87–5.11)
RDW: 14.1 % (ref 11.5–15.5)
WBC: 12.3 10*3/uL — ABNORMAL HIGH (ref 4.0–10.5)
nRBC: 0 % (ref 0.0–0.2)

## 2022-06-18 LAB — RESP PANEL BY RT-PCR (RSV, FLU A&B, COVID)  RVPGX2
Influenza A by PCR: NEGATIVE
Influenza B by PCR: NEGATIVE
Resp Syncytial Virus by PCR: NEGATIVE
SARS Coronavirus 2 by RT PCR: NEGATIVE

## 2022-06-18 LAB — COMPREHENSIVE METABOLIC PANEL
ALT: 18 U/L (ref 0–44)
AST: 22 U/L (ref 15–41)
Albumin: 3.8 g/dL (ref 3.5–5.0)
Alkaline Phosphatase: 58 U/L (ref 38–126)
Anion gap: 14 (ref 5–15)
BUN: 16 mg/dL (ref 8–23)
CO2: 18 mmol/L — ABNORMAL LOW (ref 22–32)
Calcium: 9.1 mg/dL (ref 8.9–10.3)
Chloride: 100 mmol/L (ref 98–111)
Creatinine, Ser: 1.05 mg/dL — ABNORMAL HIGH (ref 0.44–1.00)
GFR, Estimated: 52 mL/min — ABNORMAL LOW (ref >60)
Glucose, Bld: 112 mg/dL — ABNORMAL HIGH (ref 70–99)
Potassium: 3.6 mmol/L (ref 3.5–5.1)
Sodium: 132 mmol/L — ABNORMAL LOW (ref 135–145)
Total Bilirubin: 1.1 mg/dL (ref 0.3–1.2)
Total Protein: 7.1 g/dL (ref 6.5–8.1)

## 2022-06-18 LAB — MAGNESIUM: Magnesium: 1.7 mg/dL (ref 1.7–2.4)

## 2022-06-18 LAB — GLUCOSE, CAPILLARY
Glucose-Capillary: 125 mg/dL — ABNORMAL HIGH (ref 70–99)
Glucose-Capillary: 127 mg/dL — ABNORMAL HIGH (ref 70–99)

## 2022-06-18 LAB — TSH: TSH: 3.461 u[IU]/mL (ref 0.350–4.500)

## 2022-06-18 MED ORDER — LACTATED RINGERS IV BOLUS
500.0000 mL | Freq: Once | INTRAVENOUS | Status: AC
  Administered 2022-06-18: 500 mL via INTRAVENOUS

## 2022-06-18 MED ORDER — HYDRALAZINE HCL 25 MG PO TABS
100.0000 mg | ORAL_TABLET | Freq: Three times a day (TID) | ORAL | Status: DC
  Administered 2022-06-18 – 2022-06-20 (×7): 100 mg via ORAL
  Filled 2022-06-18 (×7): qty 4

## 2022-06-18 MED ORDER — RIVAROXABAN 15 MG PO TABS
15.0000 mg | ORAL_TABLET | Freq: Every day | ORAL | Status: DC
  Administered 2022-06-19: 15 mg via ORAL
  Filled 2022-06-18: qty 1

## 2022-06-18 MED ORDER — INSULIN ASPART 100 UNIT/ML IJ SOLN: 0.0000 [IU] | Freq: Three times a day (TID) | INTRAMUSCULAR | Status: DC

## 2022-06-18 MED ORDER — FLECAINIDE ACETATE 50 MG PO TABS
50.0000 mg | ORAL_TABLET | Freq: Two times a day (BID) | ORAL | Status: DC
  Administered 2022-06-18 – 2022-06-20 (×4): 50 mg via ORAL
  Filled 2022-06-18 (×4): qty 1

## 2022-06-18 MED ORDER — SODIUM CHLORIDE 0.9 % IV SOLN: INTRAVENOUS | Status: AC

## 2022-06-18 MED ORDER — DM-GUAIFENESIN ER 30-600 MG PO TB12
1.0000 | ORAL_TABLET | Freq: Two times a day (BID) | ORAL | Status: DC
  Administered 2022-06-18 – 2022-06-20 (×5): 1 via ORAL
  Filled 2022-06-18 (×5): qty 1

## 2022-06-18 MED ORDER — ACETAMINOPHEN 500 MG PO TABS
1000.0000 mg | ORAL_TABLET | Freq: Three times a day (TID) | ORAL | Status: DC
  Administered 2022-06-18 – 2022-06-20 (×7): 1000 mg via ORAL
  Filled 2022-06-18 (×7): qty 2

## 2022-06-18 MED ORDER — LINACLOTIDE 72 MCG PO CAPS: 72.0000 ug | ORAL_CAPSULE | ORAL | Status: DC | PRN

## 2022-06-18 MED ORDER — ONDANSETRON HCL 4 MG PO TABS: 4.0000 mg | ORAL_TABLET | Freq: Four times a day (QID) | ORAL | Status: DC | PRN

## 2022-06-18 MED ORDER — LATANOPROST 0.005 % OP SOLN
1.0000 [drp] | Freq: Every day | OPHTHALMIC | Status: DC
  Administered 2022-06-18 – 2022-06-19 (×2): 1 [drp] via OPHTHALMIC
  Filled 2022-06-18: qty 2.5

## 2022-06-18 MED ORDER — SODIUM CHLORIDE 0.9 % IV SOLN
1.0000 g | Freq: Once | INTRAVENOUS | Status: AC
  Administered 2022-06-18: 1 g via INTRAVENOUS
  Filled 2022-06-18: qty 10

## 2022-06-18 MED ORDER — HYDROMORPHONE HCL 1 MG/ML IJ SOLN: 1.0000 mg | INTRAMUSCULAR | Status: DC | PRN

## 2022-06-18 MED ORDER — LEVOTHYROXINE SODIUM 50 MCG PO TABS
50.0000 ug | ORAL_TABLET | Freq: Every day | ORAL | Status: DC
  Administered 2022-06-19 – 2022-06-20 (×2): 50 ug via ORAL
  Filled 2022-06-18 (×2): qty 1

## 2022-06-18 MED ORDER — AZITHROMYCIN 250 MG PO TABS
250.0000 mg | ORAL_TABLET | Freq: Every day | ORAL | Status: DC
  Administered 2022-06-19 – 2022-06-20 (×2): 250 mg via ORAL
  Filled 2022-06-18 (×2): qty 1

## 2022-06-18 MED ORDER — AMLODIPINE BESYLATE 5 MG PO TABS
5.0000 mg | ORAL_TABLET | Freq: Every day | ORAL | Status: DC
  Administered 2022-06-19 – 2022-06-20 (×2): 5 mg via ORAL
  Filled 2022-06-18 (×2): qty 1

## 2022-06-18 MED ORDER — ONDANSETRON HCL 4 MG/2ML IJ SOLN
4.0000 mg | Freq: Four times a day (QID) | INTRAMUSCULAR | Status: DC | PRN
  Administered 2022-06-19: 4 mg via INTRAVENOUS
  Filled 2022-06-18: qty 2

## 2022-06-18 MED ORDER — BUDESONIDE 0.5 MG/2ML IN SUSP
0.5000 mg | Freq: Two times a day (BID) | RESPIRATORY_TRACT | Status: DC
  Administered 2022-06-18 – 2022-06-20 (×4): 0.5 mg via RESPIRATORY_TRACT
  Filled 2022-06-18 (×4): qty 2

## 2022-06-18 MED ORDER — IRBESARTAN 150 MG PO TABS
150.0000 mg | ORAL_TABLET | Freq: Every day | ORAL | Status: DC
  Administered 2022-06-19 – 2022-06-20 (×2): 150 mg via ORAL
  Filled 2022-06-18 (×2): qty 1

## 2022-06-18 MED ORDER — ATENOLOL 25 MG PO TABS
50.0000 mg | ORAL_TABLET | Freq: Every day | ORAL | Status: DC
  Administered 2022-06-19 – 2022-06-20 (×2): 50 mg via ORAL
  Filled 2022-06-18 (×2): qty 2

## 2022-06-18 MED ORDER — METHOCARBAMOL 1000 MG/10ML IJ SOLN: 500.0000 mg | Freq: Three times a day (TID) | INTRAVENOUS | Status: DC | PRN

## 2022-06-18 MED ORDER — OXYCODONE HCL 5 MG PO TABS
5.0000 mg | ORAL_TABLET | Freq: Four times a day (QID) | ORAL | Status: DC | PRN
  Administered 2022-06-19 (×2): 10 mg via ORAL
  Administered 2022-06-20 (×2): 5 mg via ORAL
  Filled 2022-06-18: qty 1
  Filled 2022-06-18: qty 2
  Filled 2022-06-18: qty 1
  Filled 2022-06-18: qty 2

## 2022-06-18 MED ORDER — CLONIDINE HCL 0.1 MG PO TABS
0.1000 mg | ORAL_TABLET | Freq: Three times a day (TID) | ORAL | Status: DC
  Administered 2022-06-18 – 2022-06-20 (×7): 0.1 mg via ORAL
  Filled 2022-06-18 (×7): qty 1

## 2022-06-18 MED ORDER — OXYCODONE-ACETAMINOPHEN 5-325 MG PO TABS
2.0000 | ORAL_TABLET | Freq: Once | ORAL | Status: AC
  Administered 2022-06-18: 2 via ORAL
  Filled 2022-06-18: qty 2

## 2022-06-18 MED ORDER — SODIUM CHLORIDE 0.9 % IV SOLN
2.0000 g | INTRAVENOUS | Status: DC
  Administered 2022-06-19 – 2022-06-20 (×2): 2 g via INTRAVENOUS
  Filled 2022-06-18 (×2): qty 20

## 2022-06-18 MED ORDER — SODIUM CHLORIDE 0.9 % IV SOLN
500.0000 mg | Freq: Once | INTRAVENOUS | Status: AC
  Administered 2022-06-18: 500 mg via INTRAVENOUS
  Filled 2022-06-18: qty 5

## 2022-06-18 NOTE — ED Triage Notes (Signed)
Pt brought in by [facility] from home with c/o uncontrolled left hip pain after a fall last Sunday. Pt reports she was told she had a fractured hip and to follow up with Dr. Romeo Apple today. She does report she has been able to get up with walker and walk about 5-10 feet to the bathroom, but otherwise she has been laying on her back in the bed, unable to turn. She has been taking Oxycodone every 6 hours for pain with minimal relief.

## 2022-06-18 NOTE — Progress Notes (Signed)
Noted patient had not voided, bladder scanned patient bladder scan noted >438 in bladder. MD Gwenlyn Perking made aware. New order placed in and out cath. In and out cath completed output 450. MD Gwenlyn Perking made aware.

## 2022-06-18 NOTE — Progress Notes (Signed)
Patients spo2 between 89-90% on room air. O2 2lpm cann applied after breathing treatment

## 2022-06-18 NOTE — Assessment & Plan Note (Signed)
-  Recent hemoglobin A1c 4.8; prior history of elevated blood sugar and type 2 diabetes. -Patient currently not using any hypoglycemic agents -Modified carbohydrate diet has been recommended -Continue to follow CBGs.

## 2022-06-18 NOTE — Assessment & Plan Note (Signed)
-  Appears to be stable and at baseline -Continue minimizing nephrotoxic agents -Follow renal function trend -Maintain adequate hydration.

## 2022-06-18 NOTE — Assessment & Plan Note (Signed)
-  Recent mechanical fall with associated pelvic bone fracture -No surgical intervention required -Recommendation for weightbearing as tolerated physical therapy. -PT evaluation has been recommended -Patient has been struggling a lot with performing activities of daily living.

## 2022-06-18 NOTE — Progress Notes (Signed)
Patient reported that she has been getting lightheaded since admission mostly while sitting up. MD Gwenlyn Perking made aware.

## 2022-06-18 NOTE — Assessment & Plan Note (Signed)
-  Continue analgesic therapy and supportive care.

## 2022-06-18 NOTE — ED Provider Notes (Signed)
[place] EMERGENCY DEPARTMENT AT [facility] Provider Note   CSN: [id number] Arrival date & time: 06/18/22  0944     History  Chief Complaint  Patient presents with   Hip Pain    Amy [name] is a 87 y.o. female.   Hip Pain  Patient presents for right hip, shoulder, knee pain.  Medical history includes HTN, atrial fibrillation, arthritis, CKD, constipation, anemia.  At baseline, she ambulates unassisted.  She lives at home with her husband.  2 days ago, she had a mechanical fall while walking out of church.  She was seen in the ED with complaints of right hip and right knee pain.  Initial x-rays of these areas were negative.  CT scan did show nondisplaced fractures of right superior pubic ramus and right sacral ala.  She was prescribed Percocet, which she has been taking every 6 hours.  Last dose was 7 AM.  Since her ED visit, she has primarily stayed in the bed.  She was able to ambulate approximately 10 feet with the help of her husband.  She has not been eating.  She has had ongoing pain in area of right shoulder, hip, and knee.  Current pain is 10/10 in severity.  Oxycodone did not seem to help her symptoms today.     Home Medications Prior to Admission medications   Medication Sig Start Date End Date Taking? Authorizing Provider  acetaminophen (TYLENOL) 650 MG CR tablet Take 1,300 mg by mouth 2 (two) times daily as needed for pain.   Yes [provider]  amLODipine (NORVASC) 5 MG tablet Take 1 tablet (5 mg total) by mouth daily. 01/24/22  Yes Swinyer, Zachary George, NP  atenolol (TENORMIN) 50 MG tablet Take 1 tablet (50 mg total) by mouth daily. 01/04/22  Yes Pricilla Riffle, MD  Calcium Carbonate-Vitamin D (CALCIUM + D PO) Take 1 tablet by mouth daily.   Yes [provider]  cloNIDine (CATAPRES) 0.1 MG tablet Take 1 tablet (0.1 mg total) by mouth 3 (three) times daily. 02/04/22  Yes Pricilla Riffle, MD  flecainide (TAMBOCOR) 50 MG tablet TAKE 1 TABLET BY  MOUTH TWICE A DAY 05/02/22  Yes Pricilla Riffle, MD  hydrALAZINE (APRESOLINE) 100 MG tablet TAKE 1 TABLET BY MOUTH 3 TIMES DAILY. 12/18/21  Yes Pricilla Riffle, MD  latanoprost (XALATAN) 0.005 % ophthalmic solution Place 1 drop into both eyes at bedtime. 04/09/22  Yes [provider]  levothyroxine (SYNTHROID) 50 MCG tablet TAKE 1 TABLET BY MOUTH EVERY DAY 05/02/22  Yes Pricilla Riffle, MD  olmesartan (BENICAR) 40 MG tablet Take 1 tablet (40 mg total) by mouth daily. 01/24/22  Yes Swinyer, Zachary George, NP  oxyCODONE-acetaminophen (PERCOCET/ROXICET) 5-325 MG tablet Take 1 tablet by mouth every 6 (six) hours as needed for severe pain. 06/16/22  Yes Terrilee Files, MD  XARELTO 15 MG TABS tablet TAKE 1 TABLET BY MOUTH EVERY DAY WITH SUPPER 01/28/22  Yes Pricilla Riffle, MD  AMBULATORY NON FORMULARY MEDICATION Take 1 drop by mouth daily as needed (pain). CBD Oil 1 dose under tongue once daily as needed for pain    [provider]  amoxicillin (AMOXIL) 500 MG capsule Take 500 mg by mouth 3 (three) times daily as needed (For gum flare up).    [provider]  linaclotide (LINZESS) 72 MCG capsule Take 72 mcg by mouth as needed (for constipation).    [provider]      Allergies  Patient has no known allergies.    Review of Systems   Review of Systems  Constitutional:  Positive for fatigue.  Musculoskeletal:  Positive for arthralgias and gait problem.  Neurological:  Positive for weakness (generalized).  All other systems reviewed and are negative.   Physical Exam Updated Vital Signs BP (!) 152/88   Pulse 92   Temp 97.9 F (36.6 C) (Oral)   Resp 18   Ht 5\' 7"  (1.702 m)   Wt 54.7 kg   LMP  (LMP Unknown)   SpO2 93%   BMI 18.89 kg/m  Physical Exam Vitals and nursing note reviewed.  Constitutional:      General: She is not in acute distress.    Appearance: Normal appearance. She is well-developed. She is not ill-appearing, toxic-appearing or diaphoretic.  HENT:      Head: Normocephalic and atraumatic.     Right Ear: External ear normal.     Left Ear: External ear normal.     Nose: Nose normal.     Mouth/Throat:     Mouth: Mucous membranes are moist.  Eyes:     Extraocular Movements: Extraocular movements intact.     Conjunctiva/sclera: Conjunctivae normal.  Cardiovascular:     Rate and Rhythm: Normal rate and regular rhythm.     Heart sounds: No murmur heard. Pulmonary:     Effort: Pulmonary effort is normal. No respiratory distress.     Breath sounds: Normal breath sounds. No wheezing or rales.  Abdominal:     General: There is no distension.     Palpations: Abdomen is soft.     Tenderness: There is no abdominal tenderness.  Musculoskeletal:        General: No swelling.     Cervical back: Normal range of motion and neck supple.     Right lower leg: No edema.     Left lower leg: No edema.  Skin:    General: Skin is warm and dry.     Capillary Refill: Capillary refill takes less than 2 seconds.     Coloration: Skin is not jaundiced or pale.  Neurological:     General: No focal deficit present.     Mental Status: She is alert and oriented to person, place, and time.  Psychiatric:        Mood and Affect: Mood normal.        Behavior: Behavior normal.     ED Results / Procedures / Treatments   Labs (all labs ordered are listed, but only abnormal results are displayed) Labs Reviewed  CBC WITH DIFFERENTIAL/PLATELET - Abnormal; Notable for the following components:      Result Value   WBC 12.3 (*)    RBC 3.04 (*)    Hemoglobin 10.0 (*)    HCT 30.9 (*)    MCV 101.6 (*)    Neutro Abs 10.5 (*)    Lymphs Abs 0.4 (*)    Monocytes Absolute 1.3 (*)    Abs Immature Granulocytes 0.13 (*)    All other components within normal limits  COMPREHENSIVE METABOLIC PANEL - Abnormal; Notable for the following components:   Sodium 132 (*)    CO2 18 (*)    Glucose, Bld 112 (*)    Creatinine, Ser 1.05 (*)    GFR, Estimated 52 (*)    All other  components within normal limits  GLUCOSE, CAPILLARY - Abnormal; Notable for the following components:   Glucose-Capillary 125 (*)    All other components within normal limits  RESP PANEL BY RT-PCR (RSV, FLU A&B, COVID)  RVPGX2  EXPECTORATED SPUTUM ASSESSMENT W GRAM STAIN, RFLX TO RESP C  MAGNESIUM  TSH  LEGIONELLA PNEUMOPHILA SEROGP 1 UR AG  STREP PNEUMONIAE URINARY ANTIGEN  MAGNESIUM  PHOSPHORUS  COMPREHENSIVE METABOLIC PANEL    EKG None  Radiology CT Chest Wo Contrast  Result Date: 06/18/2022 CLINICAL DATA:  Trauma, fall EXAM: CT CHEST WITHOUT CONTRAST TECHNIQUE: Multidetector CT imaging of the chest was performed following the standard protocol without IV contrast. RADIATION DOSE REDUCTION: This exam was performed according to the departmental dose-optimization program which includes automated exposure control, adjustment of the mA and/or kV according to patient size and/or use of iterative reconstruction technique. COMPARISON:  Cardiac CT done on 07/04/2005 FINDINGS: Cardiovascular: Calcifications are seen in thoracic aorta. Heart is enlarged in size. Mediastinum/Nodes: There are subcentimeter nodes seen mediastinum. There is 7 mm low-density nodule in the left lobe of thyroid. No follow-up is recommended. There is frothy density in the lumen of thoracic esophagus suggesting possible gastroesophageal reflux. Lungs/Pleura: There are linear patchy densities in both lower lung fields. There are few nodular densities in left lower lung field measuring up to 11 mm in diameter. Small bilateral pleural effusions are seen. There is no pneumothorax. Upper Abdomen: Arterial calcifications are seen. Musculoskeletal: No acute findings are seen. Degenerative changes are noted in thoracic and upper lumbar spine. IMPRESSION: There are patchy infiltrates in the posterior lower lung fields suggesting atelectasis/pneumonia. There are few nodular densities in left lower lung field measuring up to 11 mm in  diameter. This may be part of atelectasis/pneumonia or underlying lung nodules such as granulomas neoplasm. Follow-up CT chest in 3 months may be considered. Small bilateral pleural effusions.  There is no pneumothorax. There are subcentimeter nodes in mediastinum, possibly suggesting reactive hyperplasia of lymph nodes. Possible gastroesophageal reflux. Electronically Signed   By: Ernie Avena M.D.   On: 06/18/2022 11:25   DG Shoulder 1 View Right  Result Date: 06/18/2022 CLINICAL DATA:  Right shoulder pain.  No known injury EXAM: RIGHT SHOULDER - 1 VIEW COMPARISON:  None Available. FINDINGS: Single frontal view. There is no evidence of fracture or dislocation. There is no evidence of arthropathy or other focal bone abnormality. Soft tissues are unremarkable. Partially imaged implanted loop recorder projects over the left mid chest. IMPRESSION: Single frontal view of the right shoulder with no fracture or dislocation. No substantial degenerative changes. Electronically Signed   By: Agustin Cree M.D.   On: 06/18/2022 10:57    Procedures Procedures    Medications Ordered in ED Medications  acetaminophen (TYLENOL) tablet 1,000 mg (1,000 mg Oral Given 06/18/22 1640)  oxyCODONE (Oxy IR/ROXICODONE) immediate release tablet 5-10 mg (has no administration in time range)  HYDROmorphone (DILAUDID) injection 1 mg (has no administration in time range)  cefTRIAXone (ROCEPHIN) 2 g in sodium chloride 0.9 % 100 mL IVPB (has no administration in time range)  azithromycin (ZITHROMAX) tablet 250 mg (has no administration in time range)  0.9 %  sodium chloride infusion ( Intravenous New Bag/Given 06/18/22 1534)  ondansetron (ZOFRAN) tablet 4 mg (has no administration in time range)    Or  ondansetron (ZOFRAN) injection 4 mg (has no administration in time range)  methocarbamol (ROBAXIN) 500 mg in dextrose 5 % 50 mL IVPB (has no administration in time range)  budesonide (PULMICORT) nebulizer solution 0.5 mg (0.5  mg Nebulization Not Given 06/18/22 1522)  dextromethorphan-guaiFENesin (MUCINEX DM) 30-600 MG per 12 hr tablet 1 tablet (1  tablet Oral Given 06/18/22 1531)  amLODipine (NORVASC) tablet 5 mg (has no administration in time range)  atenolol (TENORMIN) tablet 50 mg (has no administration in time range)  cloNIDine (CATAPRES) tablet 0.1 mg (0.1 mg Oral Given 06/18/22 1640)  flecainide (TAMBOCOR) tablet 50 mg (has no administration in time range)  hydrALAZINE (APRESOLINE) tablet 100 mg (100 mg Oral Given 06/18/22 1639)  levothyroxine (SYNTHROID) tablet 50 mcg (has no administration in time range)  latanoprost (XALATAN) 0.005 % ophthalmic solution 1 drop (has no administration in time range)  linaclotide (LINZESS) capsule 72 mcg (has no administration in time range)  irbesartan (AVAPRO) tablet 150 mg (has no administration in time range)  Rivaroxaban (XARELTO) tablet 15 mg (has no administration in time range)  insulin aspart (novoLOG) injection 0-6 Units ( Subcutaneous Not Given 06/18/22 1642)  oxyCODONE-acetaminophen (PERCOCET/ROXICET) 5-325 MG per tablet 2 tablet (2 tablets Oral Given 06/18/22 1116)  cefTRIAXone (ROCEPHIN) 1 g in sodium chloride 0.9 % 100 mL IVPB (0 g Intravenous Stopped 06/18/22 1254)  azithromycin (ZITHROMAX) 500 mg in sodium chloride 0.9 % 250 mL IVPB (0 mg Intravenous Stopped 06/18/22 1336)  lactated ringers bolus 500 mL (500 mLs Intravenous New Bag/Given 06/18/22 1425)    ED Course/ Medical Decision Making/ A&P                             Medical Decision Making Amount and/or Complexity of Data Reviewed Labs: ordered. Radiology: ordered.  Risk Prescription drug management. Decision regarding hospitalization.   This patient presents to the ED for concern of uncontrolled pain, fatigue, generalized weakness, this involves an extensive number of treatment options, and is a complaint that carries with it a high risk of complications and morbidity.  The differential diagnosis  includes pain from known pelvic injury, occult injury, deconditioning, dehydration, infection, metabolic derangements, anemia   Co morbidities that complicate the patient evaluation  HTN, atrial fibrillation, arthritis, CKD, constipation, anemia   Additional history obtained:  Additional history obtained from N/A External records from outside source obtained and reviewed including EMR   Lab Tests:  I Ordered, and personally interpreted labs.  The pertinent results include: Leukocytosis is present.  Non-anion gap metabolic acidosis is present.  Hemoglobin slightly decreased from baseline.   Imaging Studies ordered:  I ordered imaging studies including x-ray of right shoulder, CT chest I independently visualized and interpreted imaging which showed no acute findings on x-ray.  CT scan shows patchy infiltrates in the posterior lower lobes, small bilateral pleural effusions I agree with the radiologist interpretation   Cardiac Monitoring: / EKG:  The patient was maintained on a cardiac monitor.  I personally viewed and interpreted the cardiac monitored which showed an underlying rhythm of: Sinus rhythm   Problem List / ED Course / Critical interventions / Medication management  Patient presents for persistent pain in areas of right shoulder, right hip, and right knee following a mechanical fall that occurred 2 days ago.  On arrival in the ED, she is alert and oriented.  She states that she typically ambulates without walker or cane.  She has been taking Percocet for analgesia but has had persistent and severe pain despite this.  Per chart review, patient has nonoperative pelvic fractures from her recent fall.  She was given additional Percocet while in the ED for analgesia.  On monitor, patient had SpO2 of 90% on room air.   CT chest was ordered to evaluate for hypoxia.  On CT scan, she does have patchy infiltrates in posterior lower lobes.  She was treated empirically for pneumonia.  On  reassessment, patient's pain had improved.  SpO2 remained 90% at rest.  Unfortunately, patient is not amenable to trial of ambulation to assess for worsened hypoxia with walking. She was admitted for further management. I ordered medication including Percocet for analgesia; ceftriaxone and azithromycin for empiric treatment of pneumonia; IVF for hydration Reevaluation of the patient after these medicines showed that the patient improved I have reviewed the patients home medicines and have made adjustments as needed   Social Determinants of Health:  Lives at home with husband         Final Clinical Impression(s) / ED Diagnoses Final diagnoses:  Right hip pain  Pneumonia of both lower lobes due to infectious organism    Rx / DC Orders ED Discharge Orders     None         Gloris Manchester, MD 06/18/22 1801

## 2022-06-18 NOTE — H&P (Signed)
History and Physical    Patient: Amy [name]:[id number] DOB: 05-15-1935 DOA: 06/18/2022 DOS: the patient was seen and examined on 06/18/2022 PCP: Pincus Sanes, MD  Patient coming from: Home  Chief Complaint:  Chief Complaint  Patient presents with   Hip Pain   HPI: Amy [name] is a 87 y.o. female with medical history significant of arthritis, hypertension, atrial fibrillation/flutter (status post ablation and chronically on Tambocor and Xarelto), hypothyroidism, type 2 diabetes with nephropathy, chronic kidney disease a stage IIIb and anemia of chronic kidney disease; presented to the hospital secondary to worsening hip pain, intermittent coughing spells and chills.  Patient with recent mechanical fall on Sunday, 06/16/2022 (patient tripped on a carpet while leaving church), fall resulted in pelvic bone fracture and she has been struggling performing her activities of daily living, maintaining adequate nutrition and hydration or getting out of bed. -On the day of admission was also reporting intermittent coughing spells and chills; workup in the ED demonstrating atelectatic changes and concern for community-acquired pneumonia.   Cultures taken, antibiotics for community-acquired pneumonia has been initiated and [facility] has been contacted to place patient in the hospital for further evaluation and management.  Patient denies chest pain, abdominal pain, dysuria, hematuria, melena, hematochezia, focal weaknesses or any other complaints.  COVID PCR negative. Review of Systems: As mentioned in the history of present illness. All other systems reviewed and are negative. 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   Right Malar Cheek   Renal artery  stenosis [facility])    Past Surgical History:  Procedure Laterality Date   A FLUTTER ABLATION     KNEE ARTHROSCOPY Right    PERIPHERAL VASCULAR INTERVENTION Right 02/13/2021   Procedure: PERIPHERAL VASCULAR INTERVENTION;  Surgeon: Nada Libman, MD;  Location: [facility] INVASIVE CV LAB;  Service: Cardiovascular;  Laterality: Right;   RENAL ANGIOGRAPHY N/A 02/13/2021   Procedure: RENAL ANGIOGRAPHY;  Surgeon: Nada Libman, MD;  Location: [facility] INVASIVE CV LAB;  Service: Cardiovascular;  Laterality: N/A;   TUBAL LIGATION     Social History:  reports that she has never smoked. She has never been exposed to tobacco smoke. She has never used smokeless tobacco. She reports that she does not drink alcohol and does not use drugs.  No Known Allergies  Family History  Problem Relation Age of Onset   Stroke Father 32       died from stroke at age 76   Stroke Mother 53       died from stroke at age 33   Heart disease Mother    Colon cancer Brother     Prior to Admission medications   Medication Sig Start Date End Date Taking? Authorizing Provider  acetaminophen (TYLENOL) 650 MG CR tablet Take 1,300 mg by mouth 2 (two) times daily as needed for pain.   Yes [provider]  amLODipine (NORVASC) 5 MG tablet Take 1 tablet (5 mg total) by mouth daily. 01/24/22  Yes Swinyer, Zachary George, NP  atenolol (TENORMIN) 50 MG tablet Take 1 tablet (50 mg total) by mouth daily. 01/04/22  Yes Pricilla Riffle, MD  Calcium Carbonate-Vitamin D (CALCIUM + D PO) Take  1 tablet by mouth daily.   Yes [provider]  cloNIDine (CATAPRES) 0.1 MG tablet Take 1 tablet (0.1 mg total) by mouth 3 (three) times daily. 02/04/22  Yes Pricilla Riffle, MD  flecainide (TAMBOCOR) 50 MG tablet TAKE 1 TABLET BY MOUTH TWICE A DAY 05/02/22  Yes Pricilla Riffle, MD  hydrALAZINE (APRESOLINE) 100 MG tablet TAKE 1 TABLET BY MOUTH 3 TIMES DAILY. 12/18/21  Yes Pricilla Riffle, MD  latanoprost (XALATAN) 0.005 % ophthalmic solution Place 1 drop into  both eyes at bedtime. 04/09/22  Yes [provider]  levothyroxine (SYNTHROID) 50 MCG tablet TAKE 1 TABLET BY MOUTH EVERY DAY 05/02/22  Yes Pricilla Riffle, MD  olmesartan (BENICAR) 40 MG tablet Take 1 tablet (40 mg total) by mouth daily. 01/24/22  Yes Swinyer, Zachary George, NP  oxyCODONE-acetaminophen (PERCOCET/ROXICET) 5-325 MG tablet Take 1 tablet by mouth every 6 (six) hours as needed for severe pain. 06/16/22  Yes Terrilee Files, MD  XARELTO 15 MG TABS tablet TAKE 1 TABLET BY MOUTH EVERY DAY WITH SUPPER 01/28/22  Yes Pricilla Riffle, MD  AMBULATORY NON FORMULARY MEDICATION Take 1 drop by mouth daily as needed (pain). CBD Oil 1 dose under tongue once daily as needed for pain    [provider]  amoxicillin (AMOXIL) 500 MG capsule Take 500 mg by mouth 3 (three) times daily as needed (For gum flare up).    [provider]  linaclotide (LINZESS) 72 MCG capsule Take 72 mcg by mouth as needed (for constipation).    [provider]    Physical Exam: Vitals:   06/18/22 1000 06/18/22 1426 06/18/22 1639 06/18/22 1716  BP: (!) 155/74 (!) 109/91 (!) 149/73 (!) 152/88  Pulse: 92 78  92  Resp: 14 18  18   Temp: 98.9 F (37.2 C) 97.6 F (36.4 C)  97.9 F (36.6 C)  TempSrc: Oral Oral  Oral  SpO2: 93% 94%  93%  Weight:  54.7 kg    Height:  5\' 7"  (1.702 m)     General exam: Alert, awake, oriented x 3; no chest pain; in no acute distress.  Chronically ill and underweight in appearance. Respiratory system: Positive rhonchi bilaterally; no wheezing.  No using accessory muscle.  2 L nasal cannula supplementation in place. Cardiovascular system: Rate controlled, no rubs, no gallops, no JVD. Gastrointestinal system: Abdomen is nondistended, soft and nontender. No organomegaly or masses felt. Normal bowel sounds heard. Central nervous system: Alert and oriented. No focal neurological deficits.  Patient avoiding lower extremity movements due to ongoing pain in her lower back and hip  areas. Extremities: No cyanosis or clubbing. Skin: No petechiae. Psychiatry: Judgement and insight appear normal. Mood & affect appropriate.   Data Reviewed: CBC: WBC 12.3, hemoglobin 10.0 platelet count 185 K Comprehensive metabolic panel: Sodium 132, potassium 3.6, chloride 100, bicarb 18, BUN 16, creatinine 1.05 and normal LFTs; GFR 43.  Assessment and Plan: * CAP (community acquired pneumonia) -Patient with intermittent nonproductive coughing spells reported prior to admission and complains of chills. -CT scan of the chest demonstrating new atelectatic changes/infiltrates bilaterally (right more than left). -Incentive spirometer, flutter valve, IV antibiotics for community-acquired pneumonia along with bronchodilator management has been provided. -Follow expectorated sputum culture, strep pneumo and Legionella antigen. -Patient mildly hypoxic in the 88-89 on room air at time of arrival to the emergency department. -Continue as needed oxygen supplementation and follow clinical response.  Pelvic fracture (HCC) -Recent mechanical fall with associated pelvic bone  fracture -No surgical intervention required -Recommendation for weightbearing as tolerated physical therapy. -PT evaluation has been recommended -Patient has been struggling a lot with performing activities of daily living.  Hyperglycemia -Recent hemoglobin A1c 4.8; prior history of elevated blood sugar and type 2 diabetes. -Patient currently not using any hypoglycemic agents -Modified carbohydrate diet has been recommended -Continue to follow CBGs.  CKD (chronic kidney disease) stage 3, GFR 30-59 ml/min (HCC) -Appears to be stable and at baseline -Continue minimizing nephrotoxic agents -Follow renal function trend -Maintain adequate hydration.  Osteoarthritis -Continue analgesic therapy and supportive care.  Acquired hypothyroidism -Continue Synthroid.  Atrial fibrillation (HCC) -Rate control and patient denies  palpitation -Continue to follow electrolytes with intention to keep potassium above 4 and magnesium above 2 -Continue the use of Tambocor and atenolol. -Chronically on Xarelto.  Essential hypertension -Stable and well-controlled -Will continue home antihypertensive regimen and follow vital signs. -Heart healthy/low-sodium diet discussed with patient. -Maintain adequate hydration.    Advance Care Planning:   Code Status: Full Code   Consults: None  Family Communication: See below for x-ray reports.  Severity of Illness: The appropriate patient status for this patient is OBSERVATION. Observation status is judged to be reasonable and necessary in order to provide the required intensity of service to ensure the patient's safety. The patient's presenting symptoms, physical exam findings, and initial radiographic and laboratory data in the context of their medical condition is felt to place them at decreased risk for further clinical deterioration. Furthermore, it is anticipated that the patient will be medically stable for discharge from the hospital within 2 midnights of admission.   Author: Vassie Loll, MD 06/18/2022 7:14 PM  For on call review [email or web address].

## 2022-06-18 NOTE — Assessment & Plan Note (Signed)
-  Patient with intermittent nonproductive coughing spells reported prior to admission and complains of chills. -CT scan of the chest demonstrating new atelectatic changes/infiltrates bilaterally (right more than left). -Incentive spirometer, flutter valve, IV antibiotics for community-acquired pneumonia along with bronchodilator management has been provided. -Follow expectorated sputum culture, strep pneumo and Legionella antigen. -Patient mildly hypoxic in the 88-89 on room air at time of arrival to the emergency department. -Continue as needed oxygen supplementation and follow clinical response.

## 2022-06-18 NOTE — Assessment & Plan Note (Signed)
Continue Synthroid °

## 2022-06-18 NOTE — Assessment & Plan Note (Signed)
-  Stable and well-controlled -Will continue home antihypertensive regimen and follow vital signs. -Heart healthy/low-sodium diet discussed with patient. -Maintain adequate hydration.

## 2022-06-18 NOTE — Assessment & Plan Note (Signed)
-  Rate control and patient denies palpitation -Continue to follow electrolytes with intention to keep potassium above 4 and magnesium above 2 -Continue the use of Tambocor and atenolol. -Chronically on Xarelto.

## 2022-06-19 ENCOUNTER — Observation Stay (HOSPITAL_COMMUNITY): Payer: Medicare PPO

## 2022-06-19 DIAGNOSIS — E039 Hypothyroidism, unspecified: Secondary | ICD-10-CM | POA: Diagnosis present

## 2022-06-19 DIAGNOSIS — Y9301 Activity, walking, marching and hiking: Secondary | ICD-10-CM | POA: Diagnosis present

## 2022-06-19 DIAGNOSIS — D631 Anemia in chronic kidney disease: Secondary | ICD-10-CM | POA: Diagnosis present

## 2022-06-19 DIAGNOSIS — R140 Abdominal distension (gaseous): Secondary | ICD-10-CM | POA: Diagnosis not present

## 2022-06-19 DIAGNOSIS — N1832 Chronic kidney disease, stage 3b: Secondary | ICD-10-CM | POA: Diagnosis not present

## 2022-06-19 DIAGNOSIS — E1165 Type 2 diabetes mellitus with hyperglycemia: Secondary | ICD-10-CM | POA: Diagnosis present

## 2022-06-19 DIAGNOSIS — E1122 Type 2 diabetes mellitus with diabetic chronic kidney disease: Secondary | ICD-10-CM | POA: Diagnosis present

## 2022-06-19 DIAGNOSIS — Z79899 Other long term (current) drug therapy: Secondary | ICD-10-CM | POA: Diagnosis not present

## 2022-06-19 DIAGNOSIS — I480 Paroxysmal atrial fibrillation: Secondary | ICD-10-CM | POA: Diagnosis not present

## 2022-06-19 DIAGNOSIS — N1831 Chronic kidney disease, stage 3a: Secondary | ICD-10-CM

## 2022-06-19 DIAGNOSIS — I129 Hypertensive chronic kidney disease with stage 1 through stage 4 chronic kidney disease, or unspecified chronic kidney disease: Secondary | ICD-10-CM | POA: Diagnosis present

## 2022-06-19 DIAGNOSIS — Z1152 Encounter for screening for COVID-19: Secondary | ICD-10-CM | POA: Diagnosis not present

## 2022-06-19 DIAGNOSIS — Z8249 Family history of ischemic heart disease and other diseases of the circulatory system: Secondary | ICD-10-CM | POA: Diagnosis not present

## 2022-06-19 DIAGNOSIS — I4891 Unspecified atrial fibrillation: Secondary | ICD-10-CM | POA: Diagnosis present

## 2022-06-19 DIAGNOSIS — M1990 Unspecified osteoarthritis, unspecified site: Secondary | ICD-10-CM | POA: Diagnosis present

## 2022-06-19 DIAGNOSIS — S3210XA Unspecified fracture of sacrum, initial encounter for closed fracture: Secondary | ICD-10-CM | POA: Diagnosis present

## 2022-06-19 DIAGNOSIS — M25561 Pain in right knee: Secondary | ICD-10-CM | POA: Diagnosis present

## 2022-06-19 DIAGNOSIS — S32591A Other specified fracture of right pubis, initial encounter for closed fracture: Secondary | ICD-10-CM | POA: Diagnosis present

## 2022-06-19 DIAGNOSIS — Z85828 Personal history of other malignant neoplasm of skin: Secondary | ICD-10-CM | POA: Diagnosis not present

## 2022-06-19 DIAGNOSIS — Z79890 Hormone replacement therapy (postmenopausal): Secondary | ICD-10-CM | POA: Diagnosis not present

## 2022-06-19 DIAGNOSIS — I10 Essential (primary) hypertension: Secondary | ICD-10-CM | POA: Diagnosis not present

## 2022-06-19 DIAGNOSIS — R296 Repeated falls: Secondary | ICD-10-CM | POA: Diagnosis present

## 2022-06-19 DIAGNOSIS — S329XXA Fracture of unspecified parts of lumbosacral spine and pelvis, initial encounter for closed fracture: Secondary | ICD-10-CM | POA: Diagnosis present

## 2022-06-19 DIAGNOSIS — W1809XA Striking against other object with subsequent fall, initial encounter: Secondary | ICD-10-CM | POA: Diagnosis present

## 2022-06-19 DIAGNOSIS — N183 Chronic kidney disease, stage 3 unspecified: Secondary | ICD-10-CM | POA: Diagnosis present

## 2022-06-19 DIAGNOSIS — Z7901 Long term (current) use of anticoagulants: Secondary | ICD-10-CM | POA: Diagnosis not present

## 2022-06-19 DIAGNOSIS — J189 Pneumonia, unspecified organism: Secondary | ICD-10-CM | POA: Diagnosis present

## 2022-06-19 DIAGNOSIS — Z9851 Tubal ligation status: Secondary | ICD-10-CM | POA: Diagnosis not present

## 2022-06-19 LAB — STREP PNEUMONIAE URINARY ANTIGEN: Strep Pneumo Urinary Antigen: NEGATIVE

## 2022-06-19 LAB — COMPREHENSIVE METABOLIC PANEL
ALT: 20 U/L (ref 0–44)
AST: 24 U/L (ref 15–41)
Albumin: 3.4 g/dL — ABNORMAL LOW (ref 3.5–5.0)
Alkaline Phosphatase: 51 U/L (ref 38–126)
Anion gap: 13 (ref 5–15)
BUN: 16 mg/dL (ref 8–23)
CO2: 18 mmol/L — ABNORMAL LOW (ref 22–32)
Calcium: 8.4 mg/dL — ABNORMAL LOW (ref 8.9–10.3)
Chloride: 99 mmol/L (ref 98–111)
Creatinine, Ser: 0.99 mg/dL (ref 0.44–1.00)
GFR, Estimated: 56 mL/min — ABNORMAL LOW (ref >60)
Glucose, Bld: 111 mg/dL — ABNORMAL HIGH (ref 70–99)
Potassium: 4 mmol/L (ref 3.5–5.1)
Sodium: 130 mmol/L — ABNORMAL LOW (ref 135–145)
Total Bilirubin: 1 mg/dL (ref 0.3–1.2)
Total Protein: 6.4 g/dL — ABNORMAL LOW (ref 6.5–8.1)

## 2022-06-19 LAB — GLUCOSE, CAPILLARY
Glucose-Capillary: 109 mg/dL — ABNORMAL HIGH (ref 70–99)
Glucose-Capillary: 140 mg/dL — ABNORMAL HIGH (ref 70–99)
Glucose-Capillary: 166 mg/dL — ABNORMAL HIGH (ref 70–99)
Glucose-Capillary: 130 mg/dL — ABNORMAL HIGH (ref 70–99)

## 2022-06-19 LAB — PHOSPHORUS: Phosphorus: 2.4 mg/dL — ABNORMAL LOW (ref 2.5–4.6)

## 2022-06-19 LAB — MAGNESIUM: Magnesium: 1.6 mg/dL — ABNORMAL LOW (ref 1.7–2.4)

## 2022-06-19 MED ORDER — SENNOSIDES-DOCUSATE SODIUM 8.6-50 MG PO TABS
2.0000 | ORAL_TABLET | Freq: Every day | ORAL | Status: DC
  Administered 2022-06-19: 2 via ORAL
  Filled 2022-06-19: qty 2

## 2022-06-19 MED ORDER — POLYETHYLENE GLYCOL 3350 17 G PO PACK
17.0000 g | PACK | Freq: Every day | ORAL | Status: DC
  Administered 2022-06-19 – 2022-06-20 (×2): 17 g via ORAL
  Filled 2022-06-19 (×2): qty 1

## 2022-06-19 MED ORDER — MAGNESIUM SULFATE 2 GM/50ML IV SOLN
2.0000 g | Freq: Once | INTRAVENOUS | Status: AC
  Administered 2022-06-19: 2 g via INTRAVENOUS
  Filled 2022-06-19: qty 50

## 2022-06-19 MED ORDER — ALBUTEROL SULFATE (2.5 MG/3ML) 0.083% IN NEBU
2.5000 mg | INHALATION_SOLUTION | Freq: Three times a day (TID) | RESPIRATORY_TRACT | Status: DC
  Administered 2022-06-19 – 2022-06-20 (×2): 2.5 mg via RESPIRATORY_TRACT
  Filled 2022-06-19 (×3): qty 3

## 2022-06-19 MED ORDER — LINACLOTIDE 145 MCG PO CAPS
145.0000 ug | ORAL_CAPSULE | ORAL | Status: DC
  Administered 2022-06-19: 145 ug via ORAL
  Filled 2022-06-19: qty 1

## 2022-06-19 MED ORDER — FENTANYL CITRATE PF 50 MCG/ML IJ SOSY: 25.0000 ug | PREFILLED_SYRINGE | INTRAMUSCULAR | Status: DC | PRN

## 2022-06-19 MED ORDER — K PHOS MONO-SOD PHOS DI & MONO 155-852-130 MG PO TABS
250.0000 mg | ORAL_TABLET | Freq: Three times a day (TID) | ORAL | Status: DC
  Administered 2022-06-19 – 2022-06-20 (×5): 250 mg via ORAL
  Filled 2022-06-19 (×5): qty 1

## 2022-06-19 MED ORDER — MELATONIN 3 MG PO TABS
6.0000 mg | ORAL_TABLET | Freq: Every day | ORAL | Status: DC
  Administered 2022-06-19 (×2): 6 mg via ORAL
  Filled 2022-06-19 (×2): qty 2

## 2022-06-19 MED ORDER — METHOCARBAMOL 500 MG PO TABS
500.0000 mg | ORAL_TABLET | Freq: Three times a day (TID) | ORAL | Status: DC
  Administered 2022-06-19 – 2022-06-20 (×4): 500 mg via ORAL
  Filled 2022-06-19 (×4): qty 1

## 2022-06-19 MED ORDER — MIRABEGRON ER 25 MG PO TB24
25.0000 mg | ORAL_TABLET | Freq: Every day | ORAL | Status: DC
  Administered 2022-06-19 – 2022-06-20 (×2): 25 mg via ORAL
  Filled 2022-06-19 (×3): qty 1

## 2022-06-19 NOTE — Progress Notes (Signed)
PROGRESS NOTE     Amy [name], is a 87 y.o. female, DOB - February 06, 1935, WUJ:[id number]  Admit date - 06/18/2022   Admitting Physician Chiana Wamser Mariea Clonts, MD  Outpatient Primary MD for the patient is Pincus Sanes, MD  LOS - 0  Chief Complaint  Patient presents with   Hip Pain        Brief Narrative:   87 y.o. female with medical history significant of arthritis, hypertension, atrial fibrillation/flutter (status post ablation and chronically on Tambocor and Xarelto), hypothyroidism, type 2 diabetes with nephropathy, chronic kidney disease a stage IIIb and anemia of chronic kidney disease admitted on 06/18/2022 after a mechanical fall while leaving church with workup showing nonoperative pelvic fractures as well as pneumonia and electrolyte derangement    -Assessment and Plan: 1)CAP (community acquired pneumonia) --CT chest finding noted -Continue Rocephin/azithromycin along with mucolytic's and bronchodilators  2)Pelvic fracture /Recent mechanical fall with associated pelvic bone fracture -As per orthopedic surgeon no surgical/operative intervention required -Recommendation for weightbearing as tolerated physical therapy. -Physical therapy eval appreciated recommends SNF rehab -Patient has been struggling a lot with performing activities of daily living. Recurrent falls---PTA pt lived at home with her husband and did very poorly, patient has significant limitations with mobility related ADLs- this patient needs to continue to be monitored in the hospital until a SNF bed is obtained as she is not safe to go home with her current physcical limitations - Pain control remains challenging I have adjusted most relaxants and opiates  Hyperglycemia -Recent hemoglobin A1c 4.8;  -Patient currently not using any hypoglycemic agents -Feed liberally - Allow some permissive Hyperglycemia rather than risk life-threatening hypoglycemia in a patient with unreliable oral intake. Use Novolog/Humalog  Sliding scale insulin with Accu-Cheks/Fingersticks as ordered   CKD (chronic kidney disease) stage 3, GFR 30-59 ml/min (HCC) -Appears to be stable and at baseline -Continue minimizing nephrotoxic agents -Follow renal function trend -Maintain adequate hydration.  Osteoarthritis -Continue analgesic therapy and supportive care.  Acquired hypothyroidism -Continue Synthroid.  Atrial fibrillation (HCC) -Rate control and patient denies palpitation -Continue to follow electrolytes with intention to keep potassium above 4 and magnesium above 2 -Continue  Tambocor and atenolol. -Chronically on Xarelto. -Patient has an implanted loop recorder has been present for about 2 months now  Essential hypertension -Stable and well-controlled -Continue Avapro,, hydralazine, clonidine atenolol and amlodipine   Status is: Inpatient   Disposition: The patient is from: Home              Anticipated d/c is to: SNF              Anticipated d/c date is: 1 day              Patient currently is not medically stable to d/c. Barriers: Not Clinically Stable-   Code Status :  -  Code Status: Full Code   Family Communication:  (patient is alert, awake and coherent)  Discussed with husband at bedside  DVT Prophylaxis  :    Rivaroxaban (XARELTO) tablet 15 mg   Lab Results  Component Value Date   PLT 185 06/18/2022   Inpatient Medications  Scheduled Meds:  acetaminophen  1,000 mg Oral TID   amLODipine  5 mg Oral Daily   atenolol  50 mg Oral Daily   azithromycin  250 mg Oral Daily   budesonide (PULMICORT) nebulizer solution  0.5 mg Nebulization BID   cloNIDine  0.1 mg Oral TID   dextromethorphan-guaiFENesin  1 tablet Oral BID  flecainide  50 mg Oral BID   hydrALAZINE  100 mg Oral TID   insulin aspart  0-6 Units Subcutaneous TID WC   irbesartan  150 mg Oral Daily   latanoprost  1 drop Both Eyes QHS   levothyroxine  50 mcg Oral Daily   linaclotide  145 mcg Oral Q M,W,F   melatonin  6 mg Oral  QHS   methocarbamol  500 mg Oral TID   mirabegron ER  25 mg Oral Daily   phosphorus  250 mg Oral TID   polyethylene glycol  17 g Oral Daily   Rivaroxaban  15 mg Oral QPC supper   senna-docusate  2 tablet Oral QHS   Continuous Infusions:  cefTRIAXone (ROCEPHIN)  IV 2 g (06/19/22 0859)   methocarbamol (ROBAXIN) IV     PRN Meds:.HYDROmorphone (DILAUDID) injection, methocarbamol (ROBAXIN) IV, ondansetron **OR** ondansetron (ZOFRAN) IV, oxyCODONE   Anti-infectives (From admission, onward)    Start     Dose/Rate Route Frequency Ordered Stop   06/19/22 1000  cefTRIAXone (ROCEPHIN) 2 g in sodium chloride 0.9 % 100 mL IVPB        2 g 200 mL/hr over 30 Minutes Intravenous Every 24 hours 06/18/22 1327 06/24/22 0959   06/19/22 1000  azithromycin (ZITHROMAX) tablet 250 mg        250 mg Oral Daily 06/18/22 1327 06/23/22 0959   06/18/22 1215  cefTRIAXone (ROCEPHIN) 1 g in sodium chloride 0.9 % 100 mL IVPB        1 g 200 mL/hr over 30 Minutes Intravenous  Once 06/18/22 1211 06/18/22 1254   06/18/22 1215  azithromycin (ZITHROMAX) 500 mg in sodium chloride 0.9 % 250 mL IVPB        500 mg 250 mL/hr over 60 Minutes Intravenous  Once 06/18/22 1211 06/18/22 1336       Subjective: Amy [name] today has no fevers, no emesis,  No chest pain,   - Patient's husband is at bedside =-Pelvic area pain persist -last BM 06/18/2022   Objective: Vitals:   06/19/22 0830 06/19/22 0832 06/19/22 1000 06/19/22 1055  BP:   112/63 110/60  Pulse:   62 65  Resp:    18  Temp:    98.6 F (37 C)  TempSrc:    Oral  SpO2: 96% 97%  98%  Weight:      Height:        Intake/Output Summary (Last 24 hours) at 06/19/2022 1608 Last data filed at 06/19/2022 0728 Gross per 24 hour  Intake 720 ml  Output 225 ml  Net 495 ml   Filed Weights   06/18/22 0959 06/18/22 1426 06/19/22 0337  Weight: 54.4 kg 54.7 kg 56.9 kg    Physical Exam Gen:- Awake Alert,  , frail and elderly appearing HEENT:- [place].AT, No sclera  icterus Neck-Supple Neck,No JVD,.  Lungs-  CTAB , fair symmetrical air movement CV- S1, S2 normal, regular , left-sided chest wall with loop recorder in situ Abd-  +ve B.Sounds, Abd Soft, No tenderness,    Extremity/Skin:- No  edema, pedal pulses present  Psych-affect is appropriate, oriented x3 Neuro-no new focal deficits, no tremors MSK-pelvic area pain with range of motion and positional change  Data Reviewed: I have personally reviewed following labs and imaging studies  CBC: Recent Labs  Lab 06/16/22 1230 06/18/22 1121  WBC 8.6 12.3*  NEUTROABS 6.8 10.5*  HGB 11.8* 10.0*  HCT 36.7 30.9*  MCV 102.5* 101.6*  PLT 281 185   Basic Metabolic  Panel: Recent Labs  Lab 06/16/22 1230 06/18/22 1121 06/19/22 0401  NA 134* 132* 130*  K 3.9 3.6 4.0  CL 101 100 99  CO2 19* 18* 18*  GLUCOSE 112* 112* 111*  BUN 28* 16 16  CREATININE 1.25* 1.05* 0.99  CALCIUM 9.9 9.1 8.4*  MG  --  1.7 1.6*  PHOS  --   --  2.4*   GFR: Estimated Creatinine Clearance: 38 mL/min (by C-G formula based on SCr of 0.99 mg/dL). Liver Function Tests: Recent Labs  Lab 06/16/22 1230 06/18/22 1121 06/19/22 0401  AST 21 22 24   ALT 20 18 20   ALKPHOS 66 58 51  BILITOT 0.7 1.1 1.0  PROT 8.6* 7.1 6.4*  ALBUMIN 4.7 3.8 3.4*   Recent Results (from the past 240 hour(s))  Resp panel by RT-PCR (RSV, Flu A&B, Covid) Anterior Nasal Swab     Status: None   Collection Time: 06/18/22  2:09 PM   Specimen: Anterior Nasal Swab  Result Value Ref Range Status   SARS Coronavirus 2 by RT PCR NEGATIVE NEGATIVE Final    Comment: (NOTE) SARS-CoV-2 target nucleic acids are NOT DETECTED.  The SARS-CoV-2 RNA is generally detectable in upper respiratory specimens during the acute phase of infection. The lowest concentration of SARS-CoV-2 viral copies this assay can detect is 138 copies/mL. A negative result does not preclude SARS-Cov-2 infection and should not be used as the sole basis for treatment or other patient  management decisions. A negative result may occur with  improper specimen collection/handling, submission of specimen other than nasopharyngeal swab, presence of viral mutation(s) within the areas targeted by this assay, and inadequate number of viral copies(<138 copies/mL). A negative result must be combined with clinical observations, patient history, and epidemiological information. The expected result is Negative.  Fact Sheet for Patients:  BloggerCourse.com  Fact Sheet for Healthcare Providers:  SeriousBroker.it  This test is no t yet approved or cleared by the [place] FDA and  has been authorized for detection and/or diagnosis of SARS-CoV-2 by FDA under an Emergency Use Authorization (EUA). This EUA will remain  in effect (meaning this test can be used) for the duration of the COVID-19 declaration under Section 564(b)(1) of the Act, 21 U.S.C.section 360bbb-3(b)(1), unless the authorization is terminated  or revoked sooner.       Influenza A by PCR NEGATIVE NEGATIVE Final   Influenza B by PCR NEGATIVE NEGATIVE Final    Comment: (NOTE) The Xpert Xpress SARS-CoV-2/FLU/RSV plus assay is intended as an aid in the diagnosis of influenza from Nasopharyngeal swab specimens and should not be used as a sole basis for treatment. Nasal washings and aspirates are unacceptable for Xpert Xpress SARS-CoV-2/FLU/RSV testing.  Fact Sheet for Patients: BloggerCourse.com  Fact Sheet for Healthcare Providers: SeriousBroker.it  This test is not yet approved or cleared by the [place] FDA and has been authorized for detection and/or diagnosis of SARS-CoV-2 by FDA under an Emergency Use Authorization (EUA). This EUA will remain in effect (meaning this test can be used) for the duration of the COVID-19 declaration under Section 564(b)(1) of the Act, 21 U.S.C. section 360bbb-3(b)(1),  unless the authorization is terminated or revoked.     Resp Syncytial Virus by PCR NEGATIVE NEGATIVE Final    Comment: (NOTE) Fact Sheet for Patients: BloggerCourse.com  Fact Sheet for Healthcare Providers: SeriousBroker.it  This test is not yet approved or cleared by the [place] FDA and has been authorized for detection and/or diagnosis of SARS-CoV-2 by FDA  under an Emergency Use Authorization (EUA). This EUA will remain in effect (meaning this test can be used) for the duration of the COVID-19 declaration under Section 564(b)(1) of the Act, 21 U.S.C. section 360bbb-3(b)(1), unless the authorization is terminated or revoked.  Performed at [facility], [address] [place], [place] [address]      Radiology Studies: DG Abd 1 View  Result Date: 06/19/2022 CLINICAL DATA:  87 year old female with history of abdominal distension. EXAM: ABDOMEN - 1 VIEW COMPARISON:  04/08/2017. FINDINGS: Gas and stool are seen scattered throughout the colon extending to the level of the distal rectum. No pathologic distension of small bowel is noted. No gross evidence of pneumoperitoneum. Severe S shaped scoliosis of the thoracolumbar spine convex to the right in the lower thoracic region into the left in the mid to lower lumbar region. IMPRESSION: 1. Nonobstructive bowel gas pattern. 2. No pneumoperitoneum. Electronically Signed   By: Trudie Reed M.D.   On: 06/19/2022 05:20   CT Chest Wo Contrast  Result Date: 06/18/2022 CLINICAL DATA:  Trauma, fall EXAM: CT CHEST WITHOUT CONTRAST TECHNIQUE: Multidetector CT imaging of the chest was performed following the standard protocol without IV contrast. RADIATION DOSE REDUCTION: This exam was performed according to the departmental dose-optimization program which includes automated exposure control, adjustment of the mA and/or kV according to patient size and/or use of iterative reconstruction technique.  COMPARISON:  Cardiac CT done on 07/04/2005 FINDINGS: Cardiovascular: Calcifications are seen in thoracic aorta. Heart is enlarged in size. Mediastinum/Nodes: There are subcentimeter nodes seen mediastinum. There is 7 mm low-density nodule in the left lobe of thyroid. No follow-up is recommended. There is frothy density in the lumen of thoracic esophagus suggesting possible gastroesophageal reflux. Lungs/Pleura: There are linear patchy densities in both lower lung fields. There are few nodular densities in left lower lung field measuring up to 11 mm in diameter. Small bilateral pleural effusions are seen. There is no pneumothorax. Upper Abdomen: Arterial calcifications are seen. Musculoskeletal: No acute findings are seen. Degenerative changes are noted in thoracic and upper lumbar spine. IMPRESSION: There are patchy infiltrates in the posterior lower lung fields suggesting atelectasis/pneumonia. There are few nodular densities in left lower lung field measuring up to 11 mm in diameter. This may be part of atelectasis/pneumonia or underlying lung nodules such as granulomas neoplasm. Follow-up CT chest in 3 months may be considered. Small bilateral pleural effusions.  There is no pneumothorax. There are subcentimeter nodes in mediastinum, possibly suggesting reactive hyperplasia of lymph nodes. Possible gastroesophageal reflux. Electronically Signed   By: Ernie Avena M.D.   On: 06/18/2022 11:25   DG Shoulder 1 View Right  Result Date: 06/18/2022 CLINICAL DATA:  Right shoulder pain.  No known injury EXAM: RIGHT SHOULDER - 1 VIEW COMPARISON:  None Available. FINDINGS: Single frontal view. There is no evidence of fracture or dislocation. There is no evidence of arthropathy or other focal bone abnormality. Soft tissues are unremarkable. Partially imaged implanted loop recorder projects over the left mid chest. IMPRESSION: Single frontal view of the right shoulder with no fracture or dislocation. No substantial  degenerative changes. Electronically Signed   By: Agustin Cree M.D.   On: 06/18/2022 10:57     Scheduled Meds:  acetaminophen  1,000 mg Oral TID   amLODipine  5 mg Oral Daily   atenolol  50 mg Oral Daily   azithromycin  250 mg Oral Daily   budesonide (PULMICORT) nebulizer solution  0.5 mg Nebulization BID   cloNIDine  0.1 mg Oral TID   dextromethorphan-guaiFENesin  1 tablet Oral BID   flecainide  50 mg Oral BID   hydrALAZINE  100 mg Oral TID   insulin aspart  0-6 Units Subcutaneous TID WC   irbesartan  150 mg Oral Daily   latanoprost  1 drop Both Eyes QHS   levothyroxine  50 mcg Oral Daily   linaclotide  145 mcg Oral Q M,W,F   melatonin  6 mg Oral QHS   methocarbamol  500 mg Oral TID   mirabegron ER  25 mg Oral Daily   phosphorus  250 mg Oral TID   polyethylene glycol  17 g Oral Daily   Rivaroxaban  15 mg Oral QPC supper   senna-docusate  2 tablet Oral QHS   Continuous Infusions:  cefTRIAXone (ROCEPHIN)  IV 2 g (06/19/22 0859)   methocarbamol (ROBAXIN) IV      LOS: 0 days   Shon Hale M.D on 06/19/2022 at 4:08 PM  Go to [email or web address] - for contact info  [facility] - Office  [phone]  If 7PM-7AM, please contact night-coverage [email or web address] 06/19/2022, 4:08 PM

## 2022-06-19 NOTE — Progress Notes (Signed)
Patient is restless, unable to sleep. She is requesting melatonin. MD Zierle-Ghosh made aware. Received order. Medication given. Will continue to monitor.

## 2022-06-19 NOTE — NC FL2 (Signed)
[place] MEDICAID FL2 LEVEL OF CARE FORM     IDENTIFICATION  Patient Name: Amy [name] Birthdate: Jul 12, 1935 Sex: female Admission Date (Current Location): 06/18/2022  [facility] and [place] Number:  [employer] and Address:  [facility],  [address] [place] [address]      Provider Number: [id number]  Attending Physician Name and Address:  Shon Hale, MD  Relative Name and Phone Number:       Current Level of Care: Hospital Recommended Level of Care: Skilled Nursing Facility Prior Approval Number:    Date Approved/Denied:   PASRR Number: [id number]  Discharge Plan: SNF    Current Diagnoses: Patient Active Problem List   Diagnosis Date Noted   CAP (community acquired pneumonia) 06/18/2022   Pelvic fracture (HCC) 06/18/2022   Constipation 05/01/2022   Anemia 04/30/2022   Cryptogenic stroke (HCC) 04/19/2022   Chronic anticoagulation 02/20/2022   Closed left maxillary fracture (HCC) 02/20/2022   Laceration of brow without complication 02/20/2022   Closed fracture of orbit (HCC) 02/18/2022   Closed fracture of left olecranon process 02/15/2022   Hypertensive urgency 02/15/2022   Fall from ground level 02/15/2022   Syncope and collapse 02/15/2022   Closed fracture of face bones due to fall (HCC) 02/15/2022   Contusion of face 02/15/2022   Renal artery stenosis (HCC), right - monitored by vascular 01/05/2022   B12 deficiency 10/30/2021   Anxiety and depression 10/02/2021   Insomnia 04/23/2021   Paresthesias in right hand 04/23/2021   Pain in joint of left knee 12/01/2020   Osteoporosis 11/23/2019   CKD (chronic kidney disease) stage 3, GFR 30-59 ml/min (HCC) 11/22/2019   Hyperglycemia 11/22/2019   Degenerative arthritis of knee, bilateral 04/04/2017   Chronic idiopathic constipation 02/18/2017   Osteoarthritis 12/03/2016   Tinnitus of both ears 12/03/2016   Acquired hypothyroidism 01/28/2016   Primary osteoarthritis involving  multiple joints 01/28/2016   Overactive bladder 01/26/2016   Premature atrial contractions 07/03/2010   Atrial fibrillation (HCC) 06/05/2010   Essential hypertension 09/03/2008   ARTHROSCOPY, KNEE, HX OF 09/03/2008    Orientation RESPIRATION BLADDER Height & Weight     Self, Time, Situation, Place  O2 (2L) Continent Weight: 125 lb 7.1 oz (56.9 kg) Height:  5\' 7"  (170.2 cm)  BEHAVIORAL SYMPTOMS/MOOD NEUROLOGICAL BOWEL NUTRITION STATUS      Continent Diet (Heart healthy)  AMBULATORY STATUS COMMUNICATION OF NEEDS Skin   Extensive Assist Verbally Normal                       Personal Care Assistance Level of Assistance  Bathing, Feeding, Dressing Bathing Assistance: Limited assistance Feeding assistance: Independent Dressing Assistance: Limited assistance     Functional Limitations Info  Sight, Hearing, Speech Sight Info: Adequate Hearing Info: Adequate Speech Info: Adequate    SPECIAL CARE FACTORS FREQUENCY  PT (By licensed PT), OT (By licensed OT)     PT Frequency: 5 times weekly OT Frequency: 5 times weekly            Contractures Contractures Info: Not present    Additional Factors Info  Code Status, Allergies Code Status Info: FULL Allergies Info: NKA           Current Medications (06/19/2022):  This is the current hospital active medication list Current Facility-Administered Medications  Medication Dose Route Frequency Provider Last Rate Last Admin   acetaminophen (TYLENOL) tablet 1,000 mg  1,000 mg Oral TID Vassie Loll, MD   1,000 mg  at 06/19/22 0854   amLODipine (NORVASC) tablet 5 mg  5 mg Oral Daily Vassie Loll, MD   5 mg at 06/19/22 0854   atenolol (TENORMIN) tablet 50 mg  50 mg Oral Daily Vassie Loll, MD   50 mg at 06/19/22 0854   azithromycin (ZITHROMAX) tablet 250 mg  250 mg Oral Daily Vassie Loll, MD   250 mg at 06/19/22 0854   budesonide (PULMICORT) nebulizer solution 0.5 mg  0.5 mg Nebulization BID Vassie Loll, MD   0.5 mg at  06/19/22 0737   cefTRIAXone (ROCEPHIN) 2 g in sodium chloride 0.9 % 100 mL IVPB  2 g Intravenous Q24H Vassie Loll, MD 200 mL/hr at 06/19/22 0859 2 g at 06/19/22 0859   cloNIDine (CATAPRES) tablet 0.1 mg  0.1 mg Oral TID Vassie Loll, MD   0.1 mg at 06/19/22 0854   dextromethorphan-guaiFENesin (MUCINEX DM) 30-600 MG per 12 hr tablet 1 tablet  1 tablet Oral BID Vassie Loll, MD   1 tablet at 06/19/22 0854   flecainide (TAMBOCOR) tablet 50 mg  50 mg Oral BID Vassie Loll, MD   50 mg at 06/19/22 1610   hydrALAZINE (APRESOLINE) tablet 100 mg  100 mg Oral TID Vassie Loll, MD   100 mg at 06/19/22 0853   HYDROmorphone (DILAUDID) injection 1 mg  1 mg Intravenous Q4H PRN Vassie Loll, MD       insulin aspart (novoLOG) injection 0-6 Units  0-6 Units Subcutaneous TID WC Vassie Loll, MD       irbesartan (AVAPRO) tablet 150 mg  150 mg Oral Daily Vassie Loll, MD   150 mg at 06/19/22 0854   latanoprost (XALATAN) 0.005 % ophthalmic solution 1 drop  1 drop Both Eyes QHS Vassie Loll, MD   1 drop at 06/18/22 2145   levothyroxine (SYNTHROID) tablet 50 mcg  50 mcg Oral Daily Vassie Loll, MD   50 mcg at 06/19/22 9604   linaclotide (LINZESS) capsule 72 mcg  72 mcg Oral PRN Vassie Loll, MD       magnesium sulfate IVPB 2 g 50 mL  2 g Intravenous Once Emokpae, Courage, MD       melatonin tablet 6 mg  6 mg Oral QHS Zierle-Ghosh, Asia B, DO   6 mg at 06/19/22 0110   methocarbamol (ROBAXIN) 500 mg in dextrose 5 % 50 mL IVPB  500 mg Intravenous Q8H PRN Vassie Loll, MD       mirabegron ER [facility]) tablet 25 mg  25 mg Oral Daily Zierle-Ghosh, Asia B, DO   25 mg at 06/19/22 0103   ondansetron (ZOFRAN) tablet 4 mg  4 mg Oral Q6H PRN Vassie Loll, MD       Or   ondansetron [facility]) injection 4 mg  4 mg Intravenous Q6H PRN Vassie Loll, MD       oxyCODONE (Oxy IR/ROXICODONE) immediate release tablet 5-10 mg  5-10 mg Oral Q6H PRN Vassie Loll, MD   10 mg at 06/19/22 5409   phosphorus (K PHOS  NEUTRAL) tablet 250 mg  250 mg Oral TID Shon Hale, MD       Rivaroxaban (XARELTO) tablet 15 mg  15 mg Oral QPC supper Vassie Loll, MD         Discharge Medications: Please see discharge summary for a list of discharge medications.  Relevant Imaging Results:  Relevant Lab Results:   Additional Information SSN: [id number], [place]

## 2022-06-19 NOTE — Progress Notes (Signed)
Patient abdomen noted to be distended and hard. Patient still hasn't had any urine output this shift. No success on the bedpan. Patient bladder scanned. 220 noted. 2 assist to the [facility]. Patient ended up having a small output of 50 mL. MD Zierle-Ghosh made aware. Received order for DG Abd. Will continue to monitor.

## 2022-06-19 NOTE — Evaluation (Signed)
Physical Therapy Evaluation Patient Details Name: Amy [name] MRN: [id number] DOB: 08/25/1935 Today's Date: 06/19/2022  History of Present Illness  Amy [name] is a 87 y.o. female with medical history significant of arthritis, hypertension, atrial fibrillation/flutter (status post ablation and chronically on Tambocor and Xarelto), hypothyroidism, type 2 diabetes with nephropathy, chronic kidney disease a stage IIIb and anemia of chronic kidney disease; presented to the hospital secondary to worsening hip pain, intermittent coughing spells and chills.  Patient with recent mechanical fall on Sunday, 06/16/2022 (patient tripped on a carpet while leaving church), fall resulted in pelvic bone fracture and she has been struggling performing her activities of daily living, maintaining adequate nutrition and hydration or getting out of bed.  -On the day of admission was also reporting intermittent coughing spells and chills; workup in the ED demonstrating atelectatic changes and concern for community-acquired pneumonia.      Cultures taken, antibiotics for community-acquired pneumonia has been initiated and [facility] has been contacted to place patient in the hospital for further evaluation and management.     Patient denies chest pain, abdominal pain, dysuria, hematuria, melena, hematochezia, focal weaknesses or any other complaints.    Clinical Impression  On therapist arrival; patient in bed; husband at bedside.  She reports 10/10 pain right hip/pelvis and has notified nursing but is agreeable to therapist assessment.  Patient performs supine to sit with HOB elevated and min assist for right leg.  She takes extra time and reports continued pain although not increasing.  Patient sits on edge of bed with feet and both hands supported with CGA/ to min A for balance; she tends to sit in slumped posturing.  Patient performs sit to stand to RW with min A to boost up to standing and once standing needs CGA to min A for  balance.  She is unable to shift her weight to the right side to pick up her left leg so we did not attempt ambulation.  Patient returns to sitting with uncontrolled descent.  Patient needs min A to return legs to bed with sit to supine.  Nursing notified of patient pain and mobility status.  Patient will benefit from continued skilled therapy services during the remainder of her hospital stay and at the next recommended venue of care to address deficits and promote return to optimal function.           Recommendations for follow up therapy are one component of a multi-disciplinary discharge planning process, led by the attending physician.  Recommendations may be updated based on patient status, additional functional criteria and insurance authorization.  Follow Up Recommendations Can patient physically be transported by private vehicle: Yes     Assistance Recommended at Discharge Frequent or constant Supervision/Assistance  Patient can return home with the following  A lot of help with bathing/dressing/bathroom;A lot of help with walking and/or transfers;Help with stairs or ramp for entrance    Equipment Recommendations None recommended by PT  Recommendations for Other Services       Functional Status Assessment Patient has had a recent decline in their functional status and demonstrates the ability to make significant improvements in function in a reasonable and predictable amount of time.     Precautions / Restrictions Precautions Precautions: Fall Restrictions Weight Bearing Restrictions: No      Mobility  Bed Mobility Overal bed mobility: Needs Assistance Bed Mobility: Supine to Sit     Supine to sit: HOB elevated, Min assist     General bed  mobility comments: min A for right leg; takes extra time to perform due to pain Patient Response: Cooperative  Transfers Overall transfer level: Needs assistance Equipment used: Rolling walker (2 wheels) Transfers: Sit to/from  Stand Sit to Stand: Min assist           General transfer comment: min A to boost up to standing; uncontrolled descent back to sitting    Ambulation/Gait               General Gait Details: unable to weight shift or march in place so did not attempt ambulation  Stairs            Wheelchair Mobility    Modified Rankin (Stroke Patients Only)       Balance Overall balance assessment: Needs assistance Sitting-balance support: Bilateral upper extremity supported, Feet supported Sitting balance-Leahy Scale: Fair Sitting balance - Comments: fair sitting balance on edge of bed; tends to sit in a slumped posture     Standing balance-Leahy Scale: Fair Standing balance comment: fair standing balance with RW and PT CGA to min A                             Pertinent Vitals/Pain Pain Assessment Pain Assessment: 0-10 Pain Score: 10-Worst pain ever Pain Location: right pelvis; hip area Pain Intervention(s): Limited activity within patient's tolerance, Monitored during session, Patient requesting pain meds-RN notified    Home Living Family/patient expects to be discharged to:: Private residence Living Arrangements: Spouse/significant other Available Help at Discharge: Family;Available 24 hours/day Type of Home: House Home Access: Stairs to enter Entrance Stairs-Rails: None Entrance Stairs-Number of Steps: 1 Alternate Level Stairs-Number of Steps: Patient does not go to basement Home Layout: Two level;Able to live on main level with bedroom/bathroom;Full bath on main level Home Equipment: Rolling Walker (2 wheels);Cane - single point;BSC/3in1;Shower seat      Prior Function Prior Level of Function : Independent/Modified Independent             Mobility Comments: 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 ADLs Comments: Pt reports that prior to fall she was independent with all ADL's- bathing, dressing, cooking, cleaning, driving, shopping.      Hand Dominance   Dominant Hand: Right    Extremity/Trunk Assessment   Upper Extremity Assessment Upper Extremity Assessment: Generalized weakness    Lower Extremity Assessment Lower Extremity Assessment: Generalized weakness    Cervical / Trunk Assessment Cervical / Trunk Assessment: Normal  Communication   Communication: No difficulties  Cognition Arousal/Alertness: Awake/alert Behavior During Therapy: WFL for tasks assessed/performed Overall Cognitive Status: Within Functional Limits for tasks assessed                                 General Comments: pleasant and cooperative        General Comments      Exercises     Assessment/Plan    PT Assessment Patient needs continued PT services  PT Problem List Decreased strength;Decreased activity tolerance;Decreased balance;Decreased mobility;Pain       PT Treatment Interventions Balance training;Gait training;Functional mobility training;Patient/family education;Therapeutic activities;Therapeutic exercise    PT Goals (Current goals can be found in the Care Plan section)  Acute Rehab PT Goals Patient Stated Goal: return home PT Goal Formulation: With patient/family Time For Goal Achievement: 07/03/22 Potential to Achieve Goals: Good    Frequency Min 3X/week  Co-evaluation               AM-PAC PT "6 Clicks" Mobility  Outcome Measure Help needed turning from your back to your side while in a flat bed without using bedrails?: A Little Help needed moving from lying on your back to sitting on the side of a flat bed without using bedrails?: A Little Help needed moving to and from a bed to a chair (including a wheelchair)?: A Lot Help needed standing up from a chair using your arms (e.g., wheelchair or bedside chair)?: A Lot Help needed to walk in hospital room?: A Lot Help needed climbing 3-5 steps with a railing? : A Lot 6 Click Score: 14    End of Session   Activity Tolerance:  Patient limited by pain Patient left: in bed;with family/visitor present;with call bell/phone within reach Nurse Communication: Mobility status;Patient requests pain meds PT Visit Diagnosis: History of falling (Z91.81);Muscle weakness (generalized) (M62.81);Difficulty in walking, not elsewhere classified (R26.2);Other abnormalities of gait and mobility (R26.89)    Time: 1610-9604 PT Time Calculation (min) (ACUTE ONLY): 18 min   Charges:   PT Evaluation $PT Eval Low Complexity: 1 Low          8:41 AM, 06/19/22 Javier Gell Small Amy [name] MPT [facility] [place] [phone] Ph:[phone]

## 2022-06-19 NOTE — TOC Initial Note (Addendum)
Transition of Care [facility]) - Initial/Assessment Note    Patient Details  Name: Amy [name] MRN: [id number] Date of Birth: 01-Apr-1935  [facility] CM/SW Contact:    Villa Herb, LCSWA Phone Number: 06/19/2022, 9:24 AM  Clinical Narrative:                 CSW updated that PT is recommending SNF for pt at D/C. CSW spoke with pts spouse and pt to inquire about interest in SNF. Pt is agreeable and spouse requests that referral be sent out locally and then CSW follow up with bed offers. CSW to send out pts referral and review offers with pt and spouse. TOC to follow.   Addendum 2:50- CSW spoke with spouse and pt. They would like to accept bed offer at [facility] at this time. CSW to update insurance to reflect bed choice. TOC to follow.   Expected Discharge Plan: Skilled Nursing Facility Barriers to Discharge: Continued Medical Work up   Patient Goals and CMS Choice Patient states their goals for this hospitalization and ongoing recovery are:: go to SNF CMS Medicare.gov Compare Post Acute Care list provided to:: Patient Choice offered to / list presented to : Patient, Spouse [employer] ownership interest in [facility].provided to:: Patient    Expected Discharge Plan and Services In-house Referral: Clinical Social Work Discharge Planning Services: CM Consult Post Acute Care Choice: Skilled Nursing Facility Living arrangements for the past 2 months: Single Family Home                                      Prior Living Arrangements/Services Living arrangements for the past 2 months: Single Family Home Lives with:: Spouse Patient language and need for interpreter reviewed:: Yes Do you feel safe going back to the place where you live?: Yes      Need for Family Participation in Patient Care: Yes (Comment) Care giver support system in place?: Yes (comment)   Criminal Activity/Legal Involvement Pertinent to Current Situation/Hospitalization: No -  Comment as needed  Activities of Daily Living Home Assistive Devices/Equipment: Environmental consultant (specify type), Shower chair without back ADL Screening (condition at time of admission) Patient's cognitive ability adequate to safely complete daily activities?: Yes Is the patient deaf or have difficulty hearing?: No Does the patient have difficulty seeing, even when wearing glasses/contacts?: No Does the patient have difficulty concentrating, remembering, or making decisions?: No Patient able to express need for assistance with ADLs?: Yes Does the patient have difficulty dressing or bathing?: Yes Independently performs ADLs?: No Communication: Independent Dressing (OT): Needs assistance Is this a change from baseline?: Change from baseline, expected to last <3days Grooming: Needs assistance Is this a change from baseline?: Change from baseline, expected to last <3 days Feeding: Independent Bathing: Needs assistance Is this a change from baseline?: Change from baseline, expected to last <3 days Toileting: Needs assistance Is this a change from baseline?: Change from baseline, expected to last <3 days In/Out Bed: Needs assistance Is this a change from baseline?: Change from baseline, expected to last <3 days Walks in Home: Needs assistance Is this a change from baseline?: Change from baseline, expected to last <3 days Does the patient have difficulty walking or climbing stairs?: No Weakness of Legs: None Weakness of Arms/Hands: None  Permission Sought/Granted  Emotional Assessment Appearance:: Appears stated age Attitude/Demeanor/Rapport: Engaged Affect (typically observed): Accepting Orientation: : Oriented to Self, Oriented to Place, Oriented to  Time, Oriented to Situation Alcohol / Substance Use: Not Applicable Psych Involvement: No (comment)  Admission diagnosis:  CAP (community acquired pneumonia) [J18.9] Patient Active Problem List   Diagnosis Date Noted    CAP (community acquired pneumonia) 06/18/2022   Pelvic fracture (HCC) 06/18/2022   Constipation 05/01/2022   Anemia 04/30/2022   Cryptogenic stroke (HCC) 04/19/2022   Chronic anticoagulation 02/20/2022   Closed left maxillary fracture (HCC) 02/20/2022   Laceration of brow without complication 02/20/2022   Closed fracture of orbit (HCC) 02/18/2022   Closed fracture of left olecranon process 02/15/2022   Hypertensive urgency 02/15/2022   Fall from ground level 02/15/2022   Syncope and collapse 02/15/2022   Closed fracture of face bones due to fall (HCC) 02/15/2022   Contusion of face 02/15/2022   Renal artery stenosis (HCC), right - monitored by vascular 01/05/2022   B12 deficiency 10/30/2021   Anxiety and depression 10/02/2021   Insomnia 04/23/2021   Paresthesias in right hand 04/23/2021   Pain in joint of left knee 12/01/2020   Osteoporosis 11/23/2019   CKD (chronic kidney disease) stage 3, GFR 30-59 ml/min (HCC) 11/22/2019   Hyperglycemia 11/22/2019   Degenerative arthritis of knee, bilateral 04/04/2017   Chronic idiopathic constipation 02/18/2017   Osteoarthritis 12/03/2016   Tinnitus of both ears 12/03/2016   Acquired hypothyroidism 01/28/2016   Primary osteoarthritis involving multiple joints 01/28/2016   Overactive bladder 01/26/2016   Premature atrial contractions 07/03/2010   Atrial fibrillation (HCC) 06/05/2010   Essential hypertension 09/03/2008   ARTHROSCOPY, KNEE, HX OF 09/03/2008   PCP:  Pincus Sanes, MD Pharmacy:   [facility] [phone] - [place], [place] - [address] AT [facility] [address] [place] [address] Phone: [phone] Fax: [phone]     Social Determinants of Health (SDOH) Social History: SDOH Screenings   Food Insecurity: No Food Insecurity (06/18/2022)  Housing: Low Risk  (06/18/2022)  Transportation Needs: No Transportation Needs (06/18/2022)  Utilities: Not At Risk (06/18/2022)  Alcohol Screen: Low Risk   (06/26/2021)  Depression (PHQ2-9): Low Risk  (05/01/2022)  Financial Resource Strain: Low Risk  (06/26/2021)  Physical Activity: Sufficiently Active (06/26/2021)  Social Connections: Socially Integrated (06/26/2021)  Stress: No Stress Concern Present (06/26/2021)  Tobacco Use: Low Risk  (06/18/2022)   SDOH Interventions:     Readmission Risk Interventions     No data to display

## 2022-06-19 NOTE — Progress Notes (Signed)
Patient bladder scanned at 2300. 119 noted. Patient c/o urgency but unable to go. Patient placed on bedpan, no success. MD Zierle-Ghosh made aware, received order for mirabegron 25 mg daily.

## 2022-06-19 NOTE — Plan of Care (Signed)
  Problem: Acute Rehab PT Goals(only PT should resolve) Goal: Pt Will Go Supine/Side To Sit Outcome: Progressing Flowsheets (Taken 06/19/2022 0841) Pt will go Supine/Side to Sit: with min guard assist Goal: Patient Will Transfer Sit To/From Stand Outcome: Progressing Flowsheets (Taken 06/19/2022 0841) Patient will transfer sit to/from stand: with min guard assist Goal: Pt Will Transfer Bed To Chair/Chair To Bed Outcome: Progressing Flowsheets (Taken 06/19/2022 0841) Pt will Transfer Bed to Chair/Chair to Bed: with mod assist Goal: Pt Will Ambulate Outcome: Progressing Flowsheets (Taken 06/19/2022 0841) Pt will Ambulate:  10 feet  with moderate assist  with rolling walker

## 2022-06-20 ENCOUNTER — Ambulatory Visit: Payer: Medicare PPO | Admitting: Internal Medicine

## 2022-06-20 DIAGNOSIS — J189 Pneumonia, unspecified organism: Secondary | ICD-10-CM | POA: Diagnosis not present

## 2022-06-20 DIAGNOSIS — S32511D Fracture of superior rim of right pubis, subsequent encounter for fracture with routine healing: Secondary | ICD-10-CM | POA: Diagnosis not present

## 2022-06-20 DIAGNOSIS — I480 Paroxysmal atrial fibrillation: Secondary | ICD-10-CM | POA: Diagnosis not present

## 2022-06-20 DIAGNOSIS — K5904 Chronic idiopathic constipation: Secondary | ICD-10-CM | POA: Diagnosis not present

## 2022-06-20 DIAGNOSIS — E039 Hypothyroidism, unspecified: Secondary | ICD-10-CM | POA: Diagnosis not present

## 2022-06-20 DIAGNOSIS — N1832 Chronic kidney disease, stage 3b: Secondary | ICD-10-CM | POA: Diagnosis not present

## 2022-06-20 DIAGNOSIS — E1159 Type 2 diabetes mellitus with other circulatory complications: Secondary | ICD-10-CM | POA: Diagnosis not present

## 2022-06-20 DIAGNOSIS — H40053 Ocular hypertension, bilateral: Secondary | ICD-10-CM | POA: Diagnosis not present

## 2022-06-20 DIAGNOSIS — I129 Hypertensive chronic kidney disease with stage 1 through stage 4 chronic kidney disease, or unspecified chronic kidney disease: Secondary | ICD-10-CM | POA: Diagnosis not present

## 2022-06-20 DIAGNOSIS — E538 Deficiency of other specified B group vitamins: Secondary | ICD-10-CM | POA: Diagnosis not present

## 2022-06-20 DIAGNOSIS — D631 Anemia in chronic kidney disease: Secondary | ICD-10-CM | POA: Diagnosis not present

## 2022-06-20 DIAGNOSIS — I152 Hypertension secondary to endocrine disorders: Secondary | ICD-10-CM | POA: Diagnosis not present

## 2022-06-20 DIAGNOSIS — N1831 Chronic kidney disease, stage 3a: Secondary | ICD-10-CM | POA: Diagnosis not present

## 2022-06-20 DIAGNOSIS — S32591D Other specified fracture of right pubis, subsequent encounter for fracture with routine healing: Secondary | ICD-10-CM | POA: Diagnosis not present

## 2022-06-20 DIAGNOSIS — I10 Essential (primary) hypertension: Secondary | ICD-10-CM | POA: Diagnosis not present

## 2022-06-20 DIAGNOSIS — E1143 Type 2 diabetes mellitus with diabetic autonomic (poly)neuropathy: Secondary | ICD-10-CM | POA: Diagnosis not present

## 2022-06-20 DIAGNOSIS — I4821 Permanent atrial fibrillation: Secondary | ICD-10-CM | POA: Diagnosis not present

## 2022-06-20 DIAGNOSIS — D6489 Other specified anemias: Secondary | ICD-10-CM | POA: Diagnosis not present

## 2022-06-20 DIAGNOSIS — S32110D Nondisplaced Zone I fracture of sacrum, subsequent encounter for fracture with routine healing: Secondary | ICD-10-CM | POA: Diagnosis not present

## 2022-06-20 LAB — SARS CORONAVIRUS 2 BY RT PCR: SARS Coronavirus 2 by RT PCR: NEGATIVE

## 2022-06-20 LAB — LEGIONELLA PNEUMOPHILA SEROGP 1 UR AG: L. pneumophila Serogp 1 Ur Ag: NEGATIVE

## 2022-06-20 LAB — GLUCOSE, CAPILLARY
Glucose-Capillary: 107 mg/dL — ABNORMAL HIGH (ref 70–99)
Glucose-Capillary: 139 mg/dL — ABNORMAL HIGH (ref 70–99)

## 2022-06-20 MED ORDER — ALBUTEROL SULFATE (2.5 MG/3ML) 0.083% IN NEBU: 2.5000 mg | INHALATION_SOLUTION | Freq: Two times a day (BID) | RESPIRATORY_TRACT | Status: DC

## 2022-06-20 MED ORDER — POLYETHYLENE GLYCOL 3350 17 G PO PACK: 17.0000 g | PACK | Freq: Every day | ORAL | 0 refills | Status: DC

## 2022-06-20 MED ORDER — CEFDINIR 300 MG PO CAPS: 300.0000 mg | ORAL_CAPSULE | Freq: Two times a day (BID) | ORAL | 0 refills | Status: AC

## 2022-06-20 MED ORDER — AZITHROMYCIN 500 MG PO TABS: 500.0000 mg | ORAL_TABLET | Freq: Every day | ORAL | 0 refills | Status: AC

## 2022-06-20 MED ORDER — SENNOSIDES-DOCUSATE SODIUM 8.6-50 MG PO TABS: 2.0000 | ORAL_TABLET | Freq: Every day | ORAL | 2 refills | Status: DC

## 2022-06-20 MED ORDER — OXYCODONE-ACETAMINOPHEN 5-325 MG PO TABS: 1.0000 | ORAL_TABLET | Freq: Four times a day (QID) | ORAL | 0 refills | Status: DC | PRN

## 2022-06-20 MED ORDER — ATENOLOL 50 MG PO TABS: 50.0000 mg | ORAL_TABLET | Freq: Every day | ORAL | 3 refills | Status: DC

## 2022-06-20 MED ORDER — METHOCARBAMOL 500 MG PO TABS: 500.0000 mg | ORAL_TABLET | Freq: Three times a day (TID) | ORAL | 0 refills | Status: DC

## 2022-06-20 MED ORDER — AMLODIPINE BESYLATE 10 MG PO TABS: 10.0000 mg | ORAL_TABLET | Freq: Every day | ORAL | 3 refills | Status: DC

## 2022-06-20 NOTE — Discharge Summary (Signed)
Amy [name], is a 87 y.o. female  DOB 1935-07-09  MRN [id number].  Admission date:  06/18/2022  Admitting Physician  Shon Hale, MD  Discharge Date:  06/20/2022   Primary MD  Pincus Sanes, MD  Recommendations for primary care physician for things to follow:  1)Avoid ibuprofen/Advil/Aleve/Motrin/Goody Powders/Naproxen/BC powders/Meloxicam/Diclofenac/Indomethacin and other Nonsteroidal anti-inflammatory medications as these will make you more likely to bleed and can cause stomach ulcers, can also cause Kidney problems.   2)Repeat CBC and BMP Blood Tests on Tuesday 06/25/22  Admission Diagnosis  CAP (community acquired pneumonia) [J18.9] Closed pelvic fracture (HCC) [S32.9XXA]   Discharge Diagnosis  CAP (community acquired pneumonia) [J18.9] Closed pelvic fracture (HCC) [S32.9XXA]    Principal Problem:   CAP (community acquired pneumonia) Active Problems:   Essential hypertension   Atrial fibrillation (HCC)   Acquired hypothyroidism   Osteoarthritis   CKD (chronic kidney disease) stage 3, GFR 30-59 ml/min (HCC)   Hyperglycemia   Pelvic fracture (HCC)   Closed pelvic fracture (HCC)      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   Right Malar Cheek   Renal artery stenosis [facility])     Past Surgical History:  Procedure Laterality Date   A FLUTTER ABLATION     KNEE ARTHROSCOPY Right    PERIPHERAL VASCULAR INTERVENTION Right 02/13/2021   Procedure: PERIPHERAL VASCULAR INTERVENTION;  Surgeon: Nada Libman, MD;  Location: [facility] INV[facility] CV [facility];  Service: Cardiovascular;  Laterality: Right;   RENAL ANGIOGRAPHY N/A 02/13/2021   Procedure: RENAL  ANGIOGRAPHY;  Surgeon: Nada Libman, MD;  Location: [facility] IN[facility] CV [facility];  Service: Cardiovascular;  Laterality: N/A;   TUBAL LIGATION      HPI  from the history and physical done on the day of admission:    HPI: Amy [name] is a 87 y.o. female with medical history significant of arthritis, hypertension, atrial fibrillation/flutter (status post ablation and chronically on Tambocor and Xarelto), hypothyroidism, type 2 diabetes with nephropathy, chronic kidney disease a stage IIIb and anemia of chronic kidney disease; presented to the hospital secondary to worsening hip pain, intermittent coughing spells and chills.  Patient with recent mechanical fall on Sunday, 06/16/2022 (patient tripped on a carpet while leaving church), fall resulted in pelvic bone fracture and she has been struggling performing her activities of daily living, maintaining adequate nutrition and hydration or getting out of bed. -On the day of admission was also reporting intermittent coughing spells and chills; workup in the ED demonstrating atelectatic changes and concern for community-acquired pneumonia.    Cultures taken, antibiotics for community-acquired pneumonia has been initiated and [facility] has been contacted to place patient in the hospital for further evaluation and management.  Patient denies chest pain, abdominal pain, dysuria, hematuria, melena, hematochezia, focal weaknesses or any other complaints.   COVID PCR negative. Review of Systems: As mentioned in the history of present illness. All other systems reviewed and are negative.    Hospital Course:   Brief Narrative:   87 y.o. female with medical history significant of arthritis, hypertension, atrial fibrillation/flutter (status post ablation and chronically on Tambocor and Xarelto), hypothyroidism, type 2 diabetes with nephropathy, chronic kidney disease a stage IIIb and anemia of chronic kidney disease admitted on 06/18/2022 after a mechanical fall while  leaving church with workup showing nonoperative pelvic fractures as well as pneumonia and electrolyte derangement     -Assessment and Plan: 1)CAP (community acquired pneumonia) --CT chest finding noted -Treated with Rocephin/azithromycin along with mucolytic's and bronchodilators -Okay to discharge on Omnicef and azithromycin   2)Pelvic Fracture /Recent mechanical fall with associated pelvic bone fracture -As per orthopedic surgeon no surgical/operative intervention required -Recommendation for weightbearing as tolerated physical therapy. -Physical therapy eval appreciated recommends SNF rehab -Patient has been struggling a lot with performing routine activities of daily living due to pelvic area pain. Recurrent falls---PTA pt lived at home with her husband and did very poorly, patient has significant limitations with mobility related ADLs-  --- Discharging to SNF facility at this time  3)Hyperglycemia -Recent hemoglobin A1c 4.8;  -Patient currently not using any hypoglycemic agents -Feed liberally   4)CKD (chronic kidney disease) stage 3, GFR 30-59 ml/min (HCC) -Appears to be stable and at baseline -Repeat BMP on 06/24/2020 for advised   5)Acquired hypothyroidism -Continue Synthroid.   6)Atrial fibrillation (HCC) -Rate control and patient denies palpitation -Continue  Tambocor and atenolol. -Chronically on Xarelto. -Patient has an implanted loop recorder has been present for about 2 months now   HTN -Stable and well-controlled -Continue olmesartan,, hydralazine, clonidine,  atenolol and amlodipine   Osteoarthritis -Continue analgesic therapy and supportive care. -Avoid NSAIDs due to Xarelto use    Disposition: The patient is from: Home              Anticipated d/c is to: SNF  Discharge Condition: Stable  Follow UP   Contact information for after-discharge care     Destination     [facility] Preferred SNF .   Service: Skilled Nursing Contact  information: [address] [place] [address] [phone]                     Consults obtained -orthopedics  Diet and Activity recommendation:  As advised  Discharge Instructions    Discharge Instructions     Call MD for:  difficulty breathing, headache or visual disturbances   Complete by: As directed    Call MD for:  persistant dizziness or light-headedness   Complete by: As directed    Call MD for:  persistant nausea and vomiting   Complete by: As directed    Call MD for:  temperature >100.4   Complete by: As directed    Diet - low sodium heart healthy   Complete by: As directed    Discharge instructions   Complete by: As directed    1)Avoid ibuprofen/Advil/Aleve/Motrin/Goody Powders/Naproxen/BC powders/Meloxicam/Diclofenac/Indomethacin and other Nonsteroidal anti-inflammatory medications as these will make you more likely to bleed and can cause stomach ulcers, can also cause Kidney problems.   2)Repeat CBC and BMP Blood Tests on Tuesday 06/25/22   Increase activity slowly   Complete by: As directed  Discharge Medications     Allergies as of 06/20/2022   No Known Allergies      Medication List     STOP taking these medications    amoxicillin 500 MG capsule Commonly known as: AMOXIL       TAKE these medications    acetaminophen 650 MG CR tablet Commonly known as: TYLENOL Take 1,300 mg by mouth 2 (two) times daily as needed for pain.   AMBULATORY NON FORMULARY MEDICATION Take 1 drop by mouth daily as needed (pain). CBD Oil 1 dose under tongue once daily as needed for pain   amLODipine 10 MG tablet Commonly known as: NORVASC Take 1 tablet (10 mg total) by mouth daily. What changed:  medication strength how much to take   atenolol 50 MG tablet Commonly known as: TENORMIN Take 1 tablet (50 mg total) by mouth daily.   azithromycin 500 MG tablet Commonly known as: ZITHROMAX Take 1 tablet (500 mg total) by mouth  daily for 3 days.   CALCIUM + D PO Take 1 tablet by mouth daily.   cefdinir 300 MG capsule Commonly known as: OMNICEF Take 1 capsule (300 mg total) by mouth 2 (two) times daily for 5 days.   cloNIDine 0.1 MG tablet Commonly known as: CATAPRES Take 1 tablet (0.1 mg total) by mouth 3 (three) times daily.   flecainide 50 MG tablet Commonly known as: TAMBOCOR TAKE 1 TABLET BY MOUTH TWICE A DAY   hydrALAZINE 100 MG tablet Commonly known as: APRESOLINE TAKE 1 TABLET BY MOUTH 3 TIMES DAILY.   latanoprost 0.005 % ophthalmic solution Commonly known as: XALATAN Place 1 drop into both eyes at bedtime.   levothyroxine 50 MCG tablet Commonly known as: SYNTHROID TAKE 1 TABLET BY MOUTH EVERY DAY   Linzess 72 MCG capsule Generic drug: linaclotide Take 72 mcg by mouth as needed (for constipation).   methocarbamol 500 MG tablet Commonly known as: ROBAXIN Take 1 tablet (500 mg total) by mouth 3 (three) times daily.   olmesartan 40 MG tablet Commonly known as: BENICAR Take 1 tablet (40 mg total) by mouth daily.   oxyCODONE-acetaminophen 5-325 MG tablet Commonly known as: PERCOCET/ROXICET Take 1 tablet by mouth every 6 (six) hours as needed for severe pain.   polyethylene glycol 17 g packet Commonly known as: MIRALAX / GLYCOLAX Take 17 g by mouth daily. Start taking on: Jun 21, 2022   senna-docusate 8.6-50 MG tablet Commonly known as: Senokot-S Take 2 tablets by mouth at bedtime.   Xarelto 15 MG Tabs tablet Generic drug: Rivaroxaban TAKE 1 TABLET BY MOUTH EVERY DAY WITH SUPPER       Major procedures and Radiology Reports - PLEASE review detailed and final reports for all details, in brief -   DG Abd 1 View  Result Date: 06/19/2022 CLINICAL DATA:  87 year old female with history of abdominal distension. EXAM: ABDOMEN - 1 VIEW COMPARISON:  04/08/2017. FINDINGS: Gas and stool are seen scattered throughout the colon extending to the level of the distal rectum. No pathologic  distension of small bowel is noted. No gross evidence of pneumoperitoneum. Severe S shaped scoliosis of the thoracolumbar spine convex to the right in the lower thoracic region into the left in the mid to lower lumbar region. IMPRESSION: 1. Nonobstructive bowel gas pattern. 2. No pneumoperitoneum. Electronically Signed   By: Trudie Reed M.D.   On: 06/19/2022 05:20   CT Chest Wo Contrast  Result Date: 06/18/2022 CLINICAL DATA:  Trauma, fall EXAM: CT CHEST WITHOUT  CONTRAST TECHNIQUE: Multidetector CT imaging of the chest was performed following the standard protocol without IV contrast. RADIATION DOSE REDUCTION: This exam was performed according to the departmental dose-optimization program which includes automated exposure control, adjustment of the mA and/or kV according to patient size and/or use of iterative reconstruction technique. COMPARISON:  Cardiac CT done on 07/04/2005 FINDINGS: Cardiovascular: Calcifications are seen in thoracic aorta. Heart is enlarged in size. Mediastinum/Nodes: There are subcentimeter nodes seen mediastinum. There is 7 mm low-density nodule in the left lobe of thyroid. No follow-up is recommended. There is frothy density in the lumen of thoracic esophagus suggesting possible gastroesophageal reflux. Lungs/Pleura: There are linear patchy densities in both lower lung fields. There are few nodular densities in left lower lung field measuring up to 11 mm in diameter. Small bilateral pleural effusions are seen. There is no pneumothorax. Upper Abdomen: Arterial calcifications are seen. Musculoskeletal: No acute findings are seen. Degenerative changes are noted in thoracic and upper lumbar spine. IMPRESSION: There are patchy infiltrates in the posterior lower lung fields suggesting atelectasis/pneumonia. There are few nodular densities in left lower lung field measuring up to 11 mm in diameter. This may be part of atelectasis/pneumonia or underlying lung nodules such as granulomas  neoplasm. Follow-up CT chest in 3 months may be considered. Small bilateral pleural effusions.  There is no pneumothorax. There are subcentimeter nodes in mediastinum, possibly suggesting reactive hyperplasia of lymph nodes. Possible gastroesophageal reflux. Electronically Signed   By: Ernie Avena M.D.   On: 06/18/2022 11:25   DG Shoulder 1 View Right  Result Date: 06/18/2022 CLINICAL DATA:  Right shoulder pain.  No known injury EXAM: RIGHT SHOULDER - 1 VIEW COMPARISON:  None Available. FINDINGS: Single frontal view. There is no evidence of fracture or dislocation. There is no evidence of arthropathy or other focal bone abnormality. Soft tissues are unremarkable. Partially imaged implanted loop recorder projects over the left mid chest. IMPRESSION: Single frontal view of the right shoulder with no fracture or dislocation. No substantial degenerative changes. Electronically Signed   By: Agustin Cree M.D.   On: 06/18/2022 10:57   CT PELVIS WO CONTRAST  Result Date: 06/16/2022 CLINICAL DATA:  Hip trauma, mechanical fall, tripped on carpet and landed on her right hip. EXAM: CT PELVIS WITHOUT CONTRAST TECHNIQUE: Multidetector CT imaging of the pelvis was performed following the standard protocol without intravenous contrast. RADIATION DOSE REDUCTION: This exam was performed according to the departmental dose-optimization program which includes automated exposure control, adjustment of the mA and/or kV according to patient size and/or use of iterative reconstruction technique. COMPARISON:  None Available. FINDINGS: Urinary Tract:  No abnormality visualized. Bowel: Advanced sigmoid colonic diverticulosis without evidence of acute diverticulitis. Vascular/Lymphatic: No pathologically enlarged lymph nodes. No significant vascular abnormality seen. Reproductive:  No mass or other significant abnormality Other:  None. Musculoskeletal: There is a nondisplaced fracture of the right superior pubic ramus. There is  also nondisplaced fracture of the right sacral ala. Sacroiliac joints and pubic symphysis are intact. No appreciable femoral neck fracture. Muscles of the flexor, extensor and adductor compartments appear intact. No evidence of tendon tear. Skin and subcutaneous soft tissues are within normal limits. IMPRESSION: 1. Nondisplaced fracture of the right superior pubic ramus. 2. Nondisplaced fracture of the right sacral ala. 3. No evidence of femoral neck fracture. 4. Advanced sigmoid colonic diverticulosis without evidence of acute diverticulitis. Electronically Signed   By: Larose Hires D.O.   On: 06/16/2022 14:07   CT Head Wo Contrast  Result Date: 06/16/2022 CLINICAL DATA:  Head trauma. Mechanical fall tripped on carpet and landed on right hip. EXAM: CT HEAD WITHOUT CONTRAST TECHNIQUE: Contiguous axial images were obtained from the base of the skull through the vertex without intravenous contrast. RADIATION DOSE REDUCTION: This exam was performed according to the departmental dose-optimization program which includes automated exposure control, adjustment of the mA and/or kV according to patient size and/or use of iterative reconstruction technique. COMPARISON:  CT head dated February 14, 2018 FINDINGS: Brain: No evidence of acute infarction, hemorrhage, hydrocephalus, extra-axial collection or mass lesion/mass effect. Vascular: No hyperdense vessel or unexpected calcification. Skull: Normal. Negative for fracture or focal lesion. Sinuses/Orbits: No acute finding. Other: None. IMPRESSION: No acute intracranial pathology. Electronically Signed   By: Larose Hires D.O.   On: 06/16/2022 13:58   DG Chest Port 1 View  Result Date: 06/16/2022 CLINICAL DATA:  fall pain EXAM: PORTABLE CHEST - 1 VIEW COMPARISON:  None Available. FINDINGS: Mild pulmonary hyperinflation. Ill-defined airspace opacities at the left lung base. Coarse interstitial opacities the right lung base. Heart size upper normal. Aortic Atherosclerosis  (ICD10-170.0). Monitoring device overlies the mid left chest. No effusion.  No pneumothorax. Mild thoracolumbar dextroscoliosis. IMPRESSION: Ill-defined left lower lobe airspace disease. Electronically Signed   By: Corlis Leak M.D.   On: 06/16/2022 13:32   DG Hip Unilat  With Pelvis 2-3 Views Right  Result Date: 06/16/2022 CLINICAL DATA:  Fall.  Patient landed on the right hip.  Pain. EXAM: DG HIP (WITH OR WITHOUT PELVIS) 2-3V RIGHT COMPARISON:  No comparison studies available. FINDINGS: Bones are diffusely demineralized. No evidence for an acute fracture in the bony pelvis. SI joints and symphysis pubis unremarkable. AP and frog-leg lateral views of the right hip show no evidence for femoral neck fracture. Radiopaque foreign body projecting over the right femoral head is probably external to the patient. IMPRESSION: 1. No acute bony findings. 2. Radiopaque foreign body projecting over the right femoral head is probably external to the patient. Electronically Signed   By: Kennith Center M.D.   On: 06/16/2022 13:22   DG Knee 2 Views Right  Result Date: 06/16/2022 CLINICAL DATA:  Fall. Patient tripped and landed on right hip and knee. EXAM: RIGHT KNEE - 1-2 VIEW COMPARISON:  Single view right knee 04/04/2017 FINDINGS: Bones are diffusely demineralized. Loss of joint space with spurring in the lateral compartment is consistent with degenerative change. Stable appearance of bone infarct or enchondroma in the tibial metaphysis. Small joint effusion noted on lateral projection. No worrisome lytic or sclerotic osseous abnormality. IMPRESSION: 1. No acute bony findings. 2. Degenerative changes in the lateral compartment with small joint effusion. Electronically Signed   By: Kennith Center M.D.   On: 06/16/2022 13:20   CUP PACEART REMOTE DEVICE CHECK  Result Date: 05/27/2022 ILR summary report received. Battery status OK. Normal device function. No new symptom, tachy, brady, or pause episodes. Hx of PAF, 200  episodes, controlled rates, burden 18%, Xarelto per PA report.  Monthly summary reports and ROV/PRN LA, CVRS   Micro Results   Recent Results (from the past 240 hour(s))  Resp panel by RT-PCR (RSV, Flu A&B, Covid) Anterior Nasal Swab     Status: None   Collection Time: 06/18/22  2:09 PM   Specimen: Anterior Nasal Swab  Result Value Ref Range Status   SARS Coronavirus 2 by RT PCR NEGATIVE NEGATIVE Final    Comment: (NOTE) SARS-CoV-2 target nucleic acids are NOT DETECTED.  The SARS-CoV-2 RNA  is generally detectable in upper respiratory specimens during the acute phase of infection. The lowest concentration of SARS-CoV-2 viral copies this assay can detect is 138 copies/mL. A negative result does not preclude SARS-Cov-2 infection and should not be used as the sole basis for treatment or other patient management decisions. A negative result may occur with  improper specimen collection/handling, submission of specimen other than nasopharyngeal swab, presence of viral mutation(s) within the areas targeted by this assay, and inadequate number of viral copies(<138 copies/mL). A negative result must be combined with clinical observations, patient history, and epidemiological information. The expected result is Negative.  Fact Sheet for Patients:  BloggerCourse.com  Fact Sheet for Healthcare Providers:  SeriousBroker.it  This test is no t yet approved or cleared by the [place] FDA and  has been authorized for detection and/or diagnosis of SARS-CoV-2 by FDA under an Emergency Use Authorization (EUA). This EUA will remain  in effect (meaning this test can be used) for the duration of the COVID-19 declaration under Section 564(b)(1) of the Act, 21 U.S.C.section 360bbb-3(b)(1), unless the authorization is terminated  or revoked sooner.       Influenza A by PCR NEGATIVE NEGATIVE Final   Influenza B by PCR NEGATIVE NEGATIVE Final     Comment: (NOTE) The Xpert Xpress SARS-CoV-2/FLU/RSV plus assay is intended as an aid in the diagnosis of influenza from Nasopharyngeal swab specimens and should not be used as a sole basis for treatment. Nasal washings and aspirates are unacceptable for Xpert Xpress SARS-CoV-2/FLU/RSV testing.  Fact Sheet for Patients: BloggerCourse.com  Fact Sheet for Healthcare Providers: SeriousBroker.it  This test is not yet approved or cleared by the [place] FDA and has been authorized for detection and/or diagnosis of SARS-CoV-2 by FDA under an Emergency Use Authorization (EUA). This EUA will remain in effect (meaning this test can be used) for the duration of the COVID-19 declaration under Section 564(b)(1) of the Act, 21 U.S.C. section 360bbb-3(b)(1), unless the authorization is terminated or revoked.     Resp Syncytial Virus by PCR NEGATIVE NEGATIVE Final    Comment: (NOTE) Fact Sheet for Patients: BloggerCourse.com  Fact Sheet for Healthcare Providers: SeriousBroker.it  This test is not yet approved or cleared by the [place] FDA and has been authorized for detection and/or diagnosis of SARS-CoV-2 by FDA under an Emergency Use Authorization (EUA). This EUA will remain in effect (meaning this test can be used) for the duration of the COVID-19 declaration under Section 564(b)(1) of the Act, 21 U.S.C. section 360bbb-3(b)(1), unless the authorization is terminated or revoked.  Performed at [facility], [address], [place], [place] [address]   SARS Coronavirus 2 by RT PCR (hospital order, performed in [facility] hospital lab) *cepheid single result test* Anterior Nasal Swab     Status: None   Collection Time: 06/20/22  1:16 PM   Specimen: Anterior Nasal Swab  Result Value Ref Range Status   SARS Coronavirus 2 by RT PCR NEGATIVE NEGATIVE Final    Comment:  (NOTE) SARS-CoV-2 target nucleic acids are NOT DETECTED.  The SARS-CoV-2 RNA is generally detectable in upper and lower respiratory specimens during the acute phase of infection. The lowest concentration of SARS-CoV-2 viral copies this assay can detect is 250 copies / mL. A negative result does not preclude SARS-CoV-2 infection and should not be used as the sole basis for treatment or other patient management decisions.  A negative result may occur with improper specimen collection / handling, submission of specimen other  than nasopharyngeal swab, presence of viral mutation(s) within the areas targeted by this assay, and inadequate number of viral copies (<250 copies / mL). A negative result must be combined with clinical observations, patient history, and epidemiological information.  Fact Sheet for Patients:   RoadLapTop.co.za  Fact Sheet for Healthcare Providers: http://kim-miller.com/  This test is not yet approved or  cleared by the [place] FDA and has been authorized for detection and/or diagnosis of SARS-CoV-2 by FDA under an Emergency Use Authorization (EUA).  This EUA will remain in effect (meaning this test can be used) for the duration of the COVID-19 declaration under Section 564(b)(1) of the Act, 21 U.S.C. section 360bbb-3(b)(1), unless the authorization is terminated or revoked sooner.  Performed at [facility], [address] [place], [place] [address]    Today   Subjective    Kelaya Craghead today has no new complaints - Pelvic area pain improving somewhat No fever  Or chills   No Nausea, Vomiting or Diarrhea         Patient has been seen and examined prior to discharge   Objective   Blood pressure (!) 163/87, pulse (!) 108, temperature 97.9 F (36.6 C), temperature source Oral, resp. rate 18, height 5\' 7"  (1.702 m), weight 58.2 kg, SpO2 93 %.   Intake/Output Summary (Last 24 hours) at 06/20/2022  1415 Last data filed at 06/20/2022 0917 Gross per 24 hour  Intake 120 ml  Output 150 ml  Net -30 ml    Exam Gen:- Awake Alert,  , frail and elderly appearing HEENT:- [place].AT, No sclera icterus Neck-Supple Neck,No JVD,.  Lungs-  CTAB , fair symmetrical air movement CV- S1, S2 normal, regular , left-sided chest wall with loop recorder in situ Abd-  +ve B.Sounds, Abd Soft, No tenderness,    Extremity/Skin:- No  edema, pedal pulses present  Psych-affect is appropriate, oriented x3 Neuro-no new focal deficits, no tremors MSK-pelvic area pain with range of motion and positional change   Data Review   CBC w Diff:  Lab Results  Component Value Date   WBC 12.3 (H) 06/18/2022   HGB 10.0 (L) 06/18/2022   HGB 9.5 (L) 02/28/2022   HCT 30.9 (L) 06/18/2022   HCT 28.9 (L) 02/28/2022   PLT 185 06/18/2022   PLT 336 02/28/2022   LYMPHOPCT 4 06/18/2022   MONOPCT 11 06/18/2022   EOSPCT 0 06/18/2022   BASOPCT 0 06/18/2022   CMP:  Lab Results  Component Value Date   NA 130 (L) 06/19/2022   NA 139 02/07/2022   K 4.0 06/19/2022   CL 99 06/19/2022   CO2 18 (L) 06/19/2022   BUN 16 06/19/2022   BUN 32 (H) 02/07/2022   CREATININE 0.99 06/19/2022   CREATININE 1.50 (H) 01/04/2016   PROT 6.4 (L) 06/19/2022   ALBUMIN 3.4 (L) 06/19/2022   BILITOT 1.0 06/19/2022   ALKPHOS 51 06/19/2022   AST 24 06/19/2022   ALT 20 06/19/2022   Total Discharge time is about 33 minutes  Shon Hale M.D on 06/20/2022 at 2:15 PM  Go to [email or web address] -  for contact info  [facility] - Office  [phone]

## 2022-06-20 NOTE — TOC Transition Note (Signed)
Transition of Care [facility]) - CM/SW Discharge Note   Patient Details  Name: Amy [name] MRN: [id number] Date of Birth: April 10, 1935  Transition of Care [facility]) CM/SW Contact:  Elliot Gault, LCSW Phone Number: 06/20/2022, 2:51 PM   Clinical Narrative:     Pt medically stable for dc today per MD. Ruthe Mannan has been received. Kerri at [facility] states pt can admit today.  DC clinical sent electronically. RN to call report.  No other TOC needs for dc.  Final next level of care: Skilled Nursing Facility Barriers to Discharge: Barriers Resolved   Patient Goals and CMS Choice CMS Medicare.gov Compare Post Acute Care list provided to:: Patient Choice offered to / list presented to : Patient, Spouse  Discharge Placement                Patient chooses bed at: [facility] Patient to be transferred to facility by: w/c Name of family member notified: Molly Maduro Patient and family notified of of transfer: 06/20/22  Discharge Plan and Services Additional resources added to the After Visit Summary for   In-house Referral: Clinical Social Work Discharge Planning Services: CM Consult Post Acute Care Choice: Skilled Nursing Facility                               Social Determinants of Health (SDOH) Interventions SDOH Screenings   Food Insecurity: No Food Insecurity (06/18/2022)  Housing: Low Risk  (06/18/2022)  Transportation Needs: No Transportation Needs (06/18/2022)  Utilities: Not At Risk (06/18/2022)  Alcohol Screen: Low Risk  (06/26/2021)  Depression (PHQ2-9): Low Risk  (05/01/2022)  Financial Resource Strain: Low Risk  (06/26/2021)  Physical Activity: Sufficiently Active (06/26/2021)  Social Connections: Socially Integrated (06/26/2021)  Stress: No Stress Concern Present (06/26/2021)  Tobacco Use: Low Risk  (06/18/2022)     Readmission Risk Interventions     No data to display

## 2022-06-20 NOTE — Discharge Instructions (Signed)
1)Avoid ibuprofen/Advil/Aleve/Motrin/Goody Powders/Naproxen/BC powders/Meloxicam/Diclofenac/Indomethacin and other Nonsteroidal anti-inflammatory medications as these will make you more likely to bleed and can cause stomach ulcers, can also cause Kidney problems.   2)Repeat CBC and BMP Blood Tests on Tuesday 06/25/22

## 2022-06-21 ENCOUNTER — Encounter: Payer: Self-pay | Admitting: Adult Health

## 2022-06-21 ENCOUNTER — Non-Acute Institutional Stay (SKILLED_NURSING_FACILITY): Payer: Medicare PPO | Admitting: Adult Health

## 2022-06-21 ENCOUNTER — Other Ambulatory Visit: Payer: Self-pay | Admitting: Adult Health

## 2022-06-21 DIAGNOSIS — I4821 Permanent atrial fibrillation: Secondary | ICD-10-CM

## 2022-06-21 DIAGNOSIS — K5904 Chronic idiopathic constipation: Secondary | ICD-10-CM | POA: Diagnosis not present

## 2022-06-21 DIAGNOSIS — I129 Hypertensive chronic kidney disease with stage 1 through stage 4 chronic kidney disease, or unspecified chronic kidney disease: Secondary | ICD-10-CM

## 2022-06-21 DIAGNOSIS — E538 Deficiency of other specified B group vitamins: Secondary | ICD-10-CM | POA: Diagnosis not present

## 2022-06-21 DIAGNOSIS — H40053 Ocular hypertension, bilateral: Secondary | ICD-10-CM | POA: Diagnosis not present

## 2022-06-21 DIAGNOSIS — E039 Hypothyroidism, unspecified: Secondary | ICD-10-CM | POA: Diagnosis not present

## 2022-06-21 DIAGNOSIS — N1832 Chronic kidney disease, stage 3b: Secondary | ICD-10-CM | POA: Diagnosis not present

## 2022-06-21 DIAGNOSIS — J189 Pneumonia, unspecified organism: Secondary | ICD-10-CM | POA: Diagnosis not present

## 2022-06-21 DIAGNOSIS — S32591D Other specified fracture of right pubis, subsequent encounter for fracture with routine healing: Secondary | ICD-10-CM | POA: Diagnosis not present

## 2022-06-21 DIAGNOSIS — D631 Anemia in chronic kidney disease: Secondary | ICD-10-CM

## 2022-06-21 DIAGNOSIS — N1831 Chronic kidney disease, stage 3a: Secondary | ICD-10-CM

## 2022-06-21 MED ORDER — OXYCODONE-ACETAMINOPHEN 5-325 MG PO TABS: 1.0000 | ORAL_TABLET | Freq: Four times a day (QID) | ORAL | 0 refills | Status: DC | PRN

## 2022-06-21 NOTE — Progress Notes (Signed)
Location:  [facility] Room Number: 124 Place of Service:  SNF ([id number])   CODE STATUS: dnr   No Known Allergies  Chief Complaint  Patient presents with   Hospitalization Follow-up    HPI:  She is a 87 year woman who has been hospitalized from 06-18-22 through 06-20-22. Her medical history includes: hypertension; arthritis; atrial fibrillation/flutter with history of ablation; type 2 diabetes. She presented to the ED complaining of worsening hip pain with intermittent coughing spells and chills. She had had a fall on 06-16-22 she tripped on carpet while leaving church. The fall resulted in pelvic bone pain. She had been struggling with her adl care.  CAP (community acquired pneumonia): found by ct of chest; was started on rocephin and azithromycin; has been transitioned to omnicef and azithromycin.  Pelvic fracture: mechanical fall. Per orthopedic surgeon no surgical intervention was required. She has had several falls.  She is here for short term rehab with her goal to return back home. She was independent with her adl care prior to her hospitalization. 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   Right Malar Cheek   Renal artery stenosis [facility])     Past Surgical History:  Procedure Laterality Date   A FLUTTER ABLATION     KNEE ARTHROSCOPY Right    PERIPHERAL VASCULAR INTERVENTION Right 02/13/2021   Procedure: PERIPHERAL VASCULAR INTERVENTION;  Surgeon: Nada Libman, MD;  Location: [facility] INVASIVE CV LAB;   Service: Cardiovascular;  Laterality: Right;   RENAL ANGIOGRAPHY N/A 02/13/2021   Procedure: RENAL ANGIOGRAPHY;  Surgeon: Nada Libman, MD;  Location: [facility] INVASIVE CV LAB;  Service: Cardiovascular;  Laterality: N/A;   TUBAL LIGATION      Social History   Socioeconomic History   Marital status: Married    Spouse name: Not on file   Number of children: 1   Years of education: Not on file   Highest education level: Not on file  Occupational History   Occupation: Retired  Tobacco Use   Smoking status: Never    Passive exposure: Never   Smokeless tobacco: Never  Vaping Use   Vaping Use: Never used  Substance and Sexual Activity   Alcohol use: No   Drug use: No   Sexual activity: Not on file  Other Topics Concern   Not on file  Social History Narrative   Occ caffeine    Social Determinants of Health   Financial Resource Strain: Low Risk  (06/26/2021)   Overall Financial Resource Strain (CARDIA)    Difficulty of Paying Living Expenses: Not hard at all  Food Insecurity: No Food Insecurity (06/18/2022)   Hunger Vital Sign    Worried About Running Out of Food in the Last Year: Never true    Ran Out of Food in the Last Year: Never true  Transportation Needs: No Transportation Needs (06/18/2022)   PRAPARE - Administrator, Civil Service (Medical): No    Lack of Transportation (  Non-Medical): No  Physical Activity: Sufficiently Active (06/26/2021)   Exercise Vital Sign    Days of Exercise per Week: 5 days    Minutes of Exercise per Session: 30 min  Stress: No Stress Concern Present (06/26/2021)   [employer] of Occupational Health - Occupational Stress Questionnaire    Feeling of Stress : Not at all  Social Connections: Socially Integrated (06/26/2021)   Social Connection and Isolation Panel [NHANES]    Frequency of Communication with Friends and Family: More than three times a week    Frequency of Social Gatherings with Friends and Family: More than three times a  week    Attends Religious Services: More than 4 times per year    Active Member of [employer] or Organizations: Yes    Attends Engineer, structural: More than 4 times per year    Marital Status: Married  Catering manager Violence: Not At Risk (06/18/2022)   Humiliation, Afraid, Rape, and Kick questionnaire    Fear of Current or Ex-Partner: No    Emotionally Abused: No    Physically Abused: No    Sexually Abused: No   Family History  Problem Relation Age of Onset   Stroke Father 9       died from stroke at age 65   Stroke Mother 52       died from stroke at age 32   Heart disease Mother    Colon cancer Brother       VITAL SIGNS BP 135/65   Pulse 94   Temp 98.9 F (37.2 C)   Resp 20   Ht 5\' 7"  (1.702 m)   Wt 128 lb 3.2 oz (58.2 kg)   LMP  (LMP Unknown)   SpO2 94%   BMI 20.08 kg/m   Outpatient Encounter Medications as of 06/21/2022  Medication Sig   acetaminophen (TYLENOL) 650 MG CR tablet Take 1,300 mg by mouth 2 (two) times daily as needed for pain.   AMBULATORY NON FORMULARY MEDICATION Take 1 drop by mouth daily as needed (pain). CBD Oil 1 dose under tongue once daily as needed for pain   amLODipine (NORVASC) 10 MG tablet Take 1 tablet (10 mg total) by mouth daily.   atenolol (TENORMIN) 50 MG tablet Take 1 tablet (50 mg total) by mouth daily.   azithromycin (ZITHROMAX) 500 MG tablet Take 1 tablet (500 mg total) by mouth daily for 3 days.   Calcium Carbonate-Vitamin D (CALCIUM + D PO) Take 1 tablet by mouth daily.   cefdinir (OMNICEF) 300 MG capsule Take 1 capsule (300 mg total) by mouth 2 (two) times daily for 5 days.   cloNIDine (CATAPRES) 0.1 MG tablet Take 1 tablet (0.1 mg total) by mouth 3 (three) times daily.   flecainide (TAMBOCOR) 50 MG tablet TAKE 1 TABLET BY MOUTH TWICE A DAY   hydrALAZINE (APRESOLINE) 100 MG tablet TAKE 1 TABLET BY MOUTH 3 TIMES DAILY.   latanoprost (XALATAN) 0.005 % ophthalmic solution Place 1 drop into both eyes at bedtime.    levothyroxine (SYNTHROID) 50 MCG tablet TAKE 1 TABLET BY MOUTH EVERY DAY   linaclotide (LINZESS) 72 MCG capsule Take 72 mcg by mouth as needed (for constipation).   methocarbamol (ROBAXIN) 500 MG tablet Take 1 tablet (500 mg total) by mouth 3 (three) times daily.   olmesartan (BENICAR) 40 MG tablet Take 1 tablet (40 mg total) by mouth daily.   oxyCODONE-acetaminophen (PERCOCET/ROXICET) 5-325 MG tablet Take 1 tablet by mouth every 6 (six) hours as  needed for severe pain.   polyethylene glycol (MIRALAX / GLYCOLAX) 17 g packet Take 17 g by mouth daily.   senna-docusate (SENOKOT-S) 8.6-50 MG tablet Take 2 tablets by mouth at bedtime.   XARELTO 15 MG TABS tablet TAKE 1 TABLET BY MOUTH EVERY DAY WITH SUPPER   No facility-administered encounter medications on file as of 06/21/2022.     SIGNIFICANT DIAGNOSTIC EXAMS  TODAY  06-16-22: ct of pelvis:  1. Nondisplaced fracture of the right superior pubic ramus. 2. Nondisplaced fracture of the right sacral ala. 3. No evidence of femoral neck fracture. 4. Advanced sigmoid colonic diverticulosis without evidence of acute diverticulitis.  06-18-22: ct of chest: There are patchy infiltrates in the posterior lower lung fields suggesting atelectasis/pneumonia. There are few nodular densities in left lower lung field measuring up to 11 mm in diameter. This may be part of atelectasis/pneumonia or underlying lung nodules such as granulomas neoplasm. Follow-up CT chest in 3 months may be considered. Small bilateral pleural effusions.  There is no pneumothorax. There are subcentimeter nodes in mediastinum, possibly suggesting reactive hyperplasia of lymph nodes. Possible gastroesophageal reflux.  LABS REVIEWED:   05-01-22: hgb A1c 4.8; chol 196; ldl 83; trig 96; hdl 93.5 tsh 1.76; vitamin B 12: 844  06-18-22: wbc 12.3; hgb 10.0; hct 30.9; mcv 101.6 plt 185; glucose 112; bun 16; creat 1.05; k+ 3.6; na++ 132; ca 9.1; gfr 52; protein 7.1 albumin 3.8 mag 1.7     Review of Systems  Constitutional:  Negative for malaise/fatigue.  Respiratory:  Negative for cough and shortness of breath.   Cardiovascular:  Negative for chest pain, palpitations and leg swelling.  Gastrointestinal:  Negative for abdominal pain, constipation and heartburn.  Musculoskeletal:  Positive for joint pain. Negative for back pain and myalgias.       Has right hip pain with movement   Skin: Negative.   Neurological:  Negative for dizziness.  Psychiatric/Behavioral:  The patient is not nervous/anxious.    Physical Exam Constitutional:      General: She is not in acute distress.    Appearance: She is well-developed. She is not diaphoretic.  Neck:     Thyroid: No thyromegaly.  Cardiovascular:     Rate and Rhythm: Normal rate. Rhythm irregular.     Pulses: Normal pulses.     Heart sounds: Normal heart sounds.  Pulmonary:     Effort: Pulmonary effort is normal. No respiratory distress.     Breath sounds: Normal breath sounds.  Abdominal:     General: Bowel sounds are normal. There is no distension.     Palpations: Abdomen is soft.     Tenderness: There is no abdominal tenderness.  Musculoskeletal:        General: Normal range of motion.     Cervical back: Neck supple.     Right lower leg: No edema.     Left lower leg: No edema.  Lymphadenopathy:     Cervical: No cervical adenopathy.  Skin:    General: Skin is warm and dry.  Neurological:     Mental Status: She is alert and oriented to person, place, and time.  Psychiatric:        Mood and Affect: Mood normal.       ASSESSMENT/ PLAN:  TODAY  Other closed fracture of right pubis with routine healing subsequent encounter: will continue therapy as directed and will follow up with orthopedics; will continue percocet 5/325 mg every 6 hours as needed and has robaxin 500 mg  three times daily   2. Community acquired pneumonia unspecified laterality: will continue zithromycin 500 mg daily for 3 more days and  omnicef 300 mg twice daily for 5 more days. Will continue to use I/S and flutter valve  3. Permanent atrial fibrillation: heart rate is stable; will continue tenormin 50 mg daily; flecainide 50 mg twice daily for rate control and xarelto 15 mg daily   4. Benign hypertension with stage 3a chronic kidney disease: b/p 135/65: will continue norvasc 10 mg daily; tenormin 50 mg daily clonidine 0.1 mg three times daily; apresoline 100 mg three times daily; benicar 40 mg daily   5.  Chronic idiopathic constipation: will continue linzess 72 mcg daily; miralax daily; senna s tabs nightly   6. Acquired hypothyroidism: tsh 3.461 will continue synthroid 50 mcg daily   7. Stage 3b chronic kidney disease: bun 16; creat 1.05; gfr 52  8. [place] due to stage 3b chronic kidney disease: hgb 10.0  9. Increased intraocular pressure bilateral: will continue xalatan 1 drop both eyes  10. Vitamin B12 deficiency level 844   Will repeat bmp and hgb/hct   Synthia Innocent NP [facility]  call [phone]

## 2022-06-25 NOTE — Progress Notes (Signed)
Carelink Summary Report / Loop Recorder 

## 2022-06-26 ENCOUNTER — Non-Acute Institutional Stay (SKILLED_NURSING_FACILITY): Payer: Medicare PPO | Admitting: Internal Medicine

## 2022-06-26 ENCOUNTER — Encounter: Payer: Self-pay | Admitting: Internal Medicine

## 2022-06-26 DIAGNOSIS — N1831 Chronic kidney disease, stage 3a: Secondary | ICD-10-CM | POA: Diagnosis not present

## 2022-06-26 DIAGNOSIS — J189 Pneumonia, unspecified organism: Secondary | ICD-10-CM

## 2022-06-26 DIAGNOSIS — I4821 Permanent atrial fibrillation: Secondary | ICD-10-CM | POA: Diagnosis not present

## 2022-06-26 DIAGNOSIS — D631 Anemia in chronic kidney disease: Secondary | ICD-10-CM

## 2022-06-26 DIAGNOSIS — S32591D Other specified fracture of right pubis, subsequent encounter for fracture with routine healing: Secondary | ICD-10-CM

## 2022-06-26 DIAGNOSIS — N1832 Chronic kidney disease, stage 3b: Secondary | ICD-10-CM

## 2022-06-26 NOTE — Assessment & Plan Note (Signed)
Current creatinine 0.99 with a GFR of 56 indicating high stage IIIa CKD.  Serially there has been variation in the CKD with stage IIIb present at times.  Med list reviewed; no change indicated.

## 2022-06-26 NOTE — Patient Instructions (Signed)
See assessment and plan under each diagnosis in the problem list and acutely for this visit 

## 2022-06-26 NOTE — Progress Notes (Signed)
NURSING HOME LOCATION:  [facility] ROOM NUMBER:  124 P  CODE STATUS:  Full Code  PCP:  Cheryll Cockayne MD  This is a comprehensive admission note to this SNFperformed on this date less than 30 days from date of admission. Included are preadmission medical/surgical history; reconciled medication list; family history; social history and comprehensive review of systems.  Corrections and additions to the records were documented. Comprehensive physical exam was also performed. Additionally a clinical summary was entered for each active diagnosis pertinent to this admission in the Problem List to enhance continuity of care.  HPI: Patient was hospitalized 5/28 - 06/20/2022 with abnormal CXR suggesting possible community-acquired pneumonia versus atelectasis. The patient had a mechanical fall on 5/26, tripping over a rug at church.  Imaging revealed nonsurgical pelvic fracture.  She returned home, but ADLs were compromised by the pelvic pain.  She came back to the ED 5/28 because of  chills and cough as reported by a family member.  Imaging suggested atelectasis versus community-acquired pneumonia.  Specifically CT revealed patchy infiltrates in the posterior lower lung fields.  Also a few nodular densities in the lower lung field on the left were present.  Follow-up in 3 months was to be considered. White blood count was 12,300 with a left shift. She received Rocephin and azithromycin empirically which was subsequently transitioned to [facility] and azithromycin.  Pulmonary toilet with incentive spirometry was also introduced. Normochromic, normocytic anemia was present with final H/H of 10/30.9.  Peak creatinine was 1.28 while hospitalized with a nadir GFR of 43.  Final values were 0.99 with a GFR of 56 indicating CKD stage IIIa.  Protein/caloric malnutrition was suggested by an albumin of 3.4 and total protein of 6.4.  Serially there was some decrease in the sodium with a final value of  130. Because of the limitations of completing ADLs; discharge to SNF for rehab was pursued.  Past medical and surgical history: Includes history of chronic anemia, degenerative joint disease, CHF, essential hypertension, hypothyroidism, history of basal cell carcinoma, and history of renal artery stenosis. Surgeries and procedures include ablation for atrial flutter, knee arthroplasty, and renal angiography.  Social history: Nondrinker, non-smoker.  Family history: Noncontributory due to advanced age.   Review of systems: She denies significant pulmonary symptoms prior to admission.  She specifically denies any cough or chills.  She continues to have what she describes as "right hip pain" from the fracture.  She states that this is improving.  Chronically she has arthritic pain in her hips and knees for which she takes Tylenol with relief.  Chronic constipation is an issue.  She describes being depressed "at times, but not in ordinary life."  Constitutional: No fever, significant weight change Eyes: No redness, discharge, pain, vision change ENT/mouth: No nasal congestion, purulent discharge, earache, change in hearing, sore throat  Cardiovascular: No chest pain, palpitations, paroxysmal nocturnal dyspnea, claudication, edema  Respiratory: No sputum production, hemoptysis, DOE, significant snoring, apnea  Gastrointestinal: No heartburn, dysphagia, abdominal pain, nausea /vomiting, rectal bleeding, melena Genitourinary: No dysuria, hematuria, pyuria, incontinence, nocturia Dermatologic: No rash, pruritus, change in appearance of skin Neurologic: No dizziness, headache, syncope, seizures, numbness, tingling Psychiatric: No significant insomnia, anorexia Endocrine: No change in hair/skin/nails, excessive thirst, excessive hunger, excessive urination  Hematologic/lymphatic: No significant bruising, lymphadenopathy, abnormal bleeding Allergy/immunology: No itchy/watery eyes, significant sneezing,  urticaria, angioedema  Physical exam:  Pertinent or positive findings: She is thin and appears suboptimally nourished.  There is temporal wasting.  Arcus senilis is present.  Heart rhythm is slightly irregular with slight increase in S2.  She has minor expiratory low-grade rhonchi homogenously.  Subcutaneous loop recorder is present @ the left anterior chest.  Abdomen is slightly protuberant.  She has trace-1/2+ edema at the ankles.  Pedal pulses are decreased.  She has large , flat keratotic lesions over the posterior thorax.  There is a bruise at the right temple and also in the right infrapatellar area.  The lower thoracic and upper lumbar spine is deviated to the left.  General appearance:  no acute distress, increased work of breathing is present.   Lymphatic: No lymphadenopathy about the head, neck, axilla. Eyes: No conjunctival inflammation or lid edema is present. There is no scleral icterus. Ears:  External ear exam shows no significant lesions or deformities.   Nose:  External nasal examination shows no deformity or inflammation. Nasal mucosa are pink and moist without lesions, exudates Oral exam: Lips and gums are healthy appearing.There is no oropharyngeal erythema or exudate. Neck:  No thyromegaly, masses, tenderness noted.    Heart:  No gallop, murmur, click, rub.  Lungs: without wheezes, rales, rubs. Abdomen: Bowel sounds are normal.  Abdomen is soft and nontender with no organomegaly, hernias, masses. GU: Deferred  Extremities:  No cyanosis, clubbing. Neurologic exam: Balance, Rhomberg, finger to nose testing could not be completed due to clinical state Skin: Warm & dry w/o tenting. No significant rash.  See clinical summary under each active problem in the Problem List with associated updated therapeutic plan

## 2022-06-26 NOTE — Assessment & Plan Note (Addendum)
At this time she denies any cough or shortness of breath.  She continues incentive spirometry. Low grade rhonchi present; PCP may consider PFTs if this finding persists.

## 2022-06-26 NOTE — Assessment & Plan Note (Addendum)
Current H/H is 10/30.9 with normochromic, normocytic indices.  No bleeding dyscrasias reported by staff.  Continue to monitor as on Xarelto prophylaxis due to CAF.

## 2022-06-26 NOTE — Assessment & Plan Note (Signed)
PT/OT at SNF as tolerated. ?

## 2022-06-26 NOTE — Assessment & Plan Note (Signed)
Rhythm is slightly irregular but rate is well-controlled.  No change indicated in present cardiac medicines.

## 2022-07-02 ENCOUNTER — Other Ambulatory Visit: Payer: Self-pay | Admitting: Adult Health

## 2022-07-04 ENCOUNTER — Encounter: Payer: Self-pay | Admitting: Adult Health

## 2022-07-04 ENCOUNTER — Other Ambulatory Visit (HOSPITAL_COMMUNITY)
Admission: RE | Admit: 2022-07-04 | Discharge: 2022-07-04 | Disposition: A | Discharge status: Home / Self Care | Payer: Medicare PPO | Source: Skilled Nursing Facility | Attending: Adult Health | Admitting: Adult Health

## 2022-07-04 ENCOUNTER — Non-Acute Institutional Stay (SKILLED_NURSING_FACILITY): Payer: Medicare PPO | Admitting: Adult Health

## 2022-07-04 DIAGNOSIS — I4821 Permanent atrial fibrillation: Secondary | ICD-10-CM

## 2022-07-04 DIAGNOSIS — J189 Pneumonia, unspecified organism: Secondary | ICD-10-CM

## 2022-07-04 DIAGNOSIS — N1831 Chronic kidney disease, stage 3a: Secondary | ICD-10-CM | POA: Diagnosis not present

## 2022-07-04 DIAGNOSIS — S32591D Other specified fracture of right pubis, subsequent encounter for fracture with routine healing: Secondary | ICD-10-CM

## 2022-07-04 DIAGNOSIS — S32511D Fracture of superior rim of right pubis, subsequent encounter for fracture with routine healing: Secondary | ICD-10-CM | POA: Insufficient documentation

## 2022-07-04 LAB — BASIC METABOLIC PANEL
Anion gap: 9 (ref 5–15)
BUN: 29 mg/dL — ABNORMAL HIGH (ref 8–23)
CO2: 21 mmol/L — ABNORMAL LOW (ref 22–32)
Calcium: 8.6 mg/dL — ABNORMAL LOW (ref 8.9–10.3)
Chloride: 103 mmol/L (ref 98–111)
Creatinine, Ser: 1.26 mg/dL — ABNORMAL HIGH (ref 0.44–1.00)
GFR, Estimated: 42 mL/min — ABNORMAL LOW (ref >60)
Glucose, Bld: 77 mg/dL (ref 70–99)
Potassium: 4 mmol/L (ref 3.5–5.1)
Sodium: 133 mmol/L — ABNORMAL LOW (ref 135–145)

## 2022-07-04 LAB — HEMOGLOBIN AND HEMATOCRIT, BLOOD
HCT: 27.4 % — ABNORMAL LOW (ref 36.0–46.0)
Hemoglobin: 8.5 g/dL — ABNORMAL LOW (ref 12.0–15.0)

## 2022-07-04 NOTE — Progress Notes (Signed)
Location:  [facility] Room Number: 124 P Place of Service:  SNF (31)   CODE STATUS: DNR  No Known Allergies  Chief Complaint  Patient presents with   Discharge Note    Discharge from [facility]     HPI:  She is being discharged to home with home health for pt/ot. She will not need any dme. She will need her prescriptions written and will need to follow up with her medical provider. She had been hospitalized for right pelvic fracture and CAP.  She was admitted to this facility for short term rehab: ambulate 120 feet with rolling walker and supervision. Upper body: supervision; lower body contact guard assist; transfer supervision; stand/pivot: supervision; stairs bilateral rails contact guard.   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   Right Malar Cheek   Renal artery stenosis [facility])     Past Surgical History:  Procedure Laterality Date   A FLUTTER ABLATION     KNEE ARTHROSCOPY Right    PERIPHERAL VASCULAR INTERVENTION Right 02/13/2021   Procedure: PERIPHERAL VASCULAR INTERVENTION;  Surgeon: Nada Libman, MD;  Location: [facility] INVASIVE CV LAB;  Service: Cardiovascular;  Laterality: Right;   RENAL ANGIOGRAPHY N/A 02/13/2021   Procedure: RENAL ANGIOGRAPHY;  Surgeon: Nada Libman, MD;  Location: [facility] INVASIVE CV LAB;  Service: Cardiovascular;  Laterality: N/A;   TUBAL LIGATION      Social History   Socioeconomic History   Marital status: Married    Spouse name: Not on file   Number of children: 1   Years of education: Not on file   Highest education level: Not on file  Occupational History   Occupation: Retired  Tobacco Use   Smoking status: Never    Passive exposure: Never   Smokeless tobacco:  Never  Vaping Use   Vaping Use: Never used  Substance and Sexual Activity   Alcohol use: No   Drug use: No   Sexual activity: Not on file  Other Topics Concern   Not on file  Social History Narrative   Occ caffeine    Social Determinants of Health   Financial Resource Strain: Low Risk  (06/26/2021)   Overall Financial Resource Strain (CARDIA)    Difficulty of Paying Living Expenses: Not hard at all  Food Insecurity: No Food Insecurity (06/18/2022)   Hunger Vital Sign    Worried About Running Out of Food in the Last Year: Never true    Ran Out of Food in the Last Year: Never true  Transportation Needs: No Transportation Needs (06/18/2022)   PRAPARE - Administrator, Civil Service (Medical): No    Lack of Transportation (Non-Medical): No  Physical Activity: Sufficiently Active (06/26/2021)   Exercise Vital Sign    Days of Exercise per Week: 5 days    Minutes of Exercise per Session: 30 min  Stress: No Stress Concern Present (06/26/2021)   [employer] of Occupational Health - Occupational Stress Questionnaire    Feeling of Stress : Not at all  Social Connections: Socially Integrated (06/26/2021)   Social Connection and Isolation Panel [NHANES]    Frequency of Communication with Friends and Family: More  than three times a week    Frequency of Social Gatherings with Friends and Family: More than three times a week    Attends Religious Services: More than 4 times per year    Active Member of [employer] or Organizations: Yes    Attends Engineer, structural: More than 4 times per year    Marital Status: Married  Catering manager Violence: Not At Risk (06/18/2022)   Humiliation, Afraid, Rape, and Kick questionnaire    Fear of Current or Ex-Partner: No    Emotionally Abused: No    Physically Abused: No    Sexually Abused: No   Family History  Problem Relation Age of Onset   Stroke Father 9       died from stroke at age 74   Stroke Mother 51       died from stroke  at age 32   Heart disease Mother    Colon cancer Brother       VITAL SIGNS BP 129/71   Pulse 87   Ht 5\' 7"  (1.702 m)   Wt 124 lb 6.4 oz (56.4 kg)   LMP  (LMP Unknown)   BMI 19.48 kg/m   Outpatient Encounter Medications as of 07/04/2022  Medication Sig   acetaminophen (TYLENOL) 650 MG CR tablet Take 650 mg by mouth 3 (three) times daily.   amLODipine (NORVASC) 10 MG tablet Take 1 tablet (10 mg total) by mouth daily.   atenolol (TENORMIN) 50 MG tablet Take 1 tablet (50 mg total) by mouth daily.   Calcium Carbonate-Vitamin D (CALCIUM + D PO) Take 1 tablet by mouth daily.   cloNIDine (CATAPRES) 0.1 MG tablet Take 1 tablet (0.1 mg total) by mouth 3 (three) times daily.   flecainide (TAMBOCOR) 50 MG tablet TAKE 1 TABLET BY MOUTH TWICE A DAY   hydrALAZINE (APRESOLINE) 100 MG tablet TAKE 1 TABLET BY MOUTH 3 TIMES DAILY.   latanoprost (XALATAN) 0.005 % ophthalmic solution Place 1 drop into both eyes at bedtime.   levothyroxine (SYNTHROID) 50 MCG tablet TAKE 1 TABLET BY MOUTH EVERY DAY   linaclotide (LINZESS) 72 MCG capsule Take 72 mcg by mouth as needed (for constipation).   losartan (COZAAR) 100 MG tablet Take 100 mg by mouth daily.   melatonin 5 MG TABS Take 5 mg by mouth at bedtime.   methocarbamol (ROBAXIN) 500 MG tablet Take 1 tablet (500 mg total) by mouth 3 (three) times daily.   polyethylene glycol (MIRALAX / GLYCOLAX) 17 g packet Take 17 g by mouth daily.   senna-docusate (SENOKOT-S) 8.6-50 MG tablet Take 2 tablets by mouth at bedtime.   XARELTO 15 MG TABS tablet TAKE 1 TABLET BY MOUTH EVERY DAY WITH SUPPER   AMBULATORY NON FORMULARY MEDICATION Take 1 drop by mouth daily as needed (pain). CBD Oil 1 dose under tongue once daily as needed for pain   [DISCONTINUED] olmesartan (BENICAR) 40 MG tablet Take 1 tablet (40 mg total) by mouth daily.   [DISCONTINUED] oxyCODONE-acetaminophen (PERCOCET/ROXICET) 5-325 MG tablet Take 1 tablet by mouth every 6 (six) hours as needed for severe  pain.   No facility-administered encounter medications on file as of 07/04/2022.     SIGNIFICANT DIAGNOSTIC EXAMS  TODAY  06-16-22: ct of pelvis:  1. Nondisplaced fracture of the right superior pubic ramus. 2. Nondisplaced fracture of the right sacral ala. 3. No evidence of femoral neck fracture. 4. Advanced sigmoid colonic diverticulosis without evidence of acute diverticulitis.  06-18-22: ct of chest: There are patchy  infiltrates in the posterior lower lung fields suggesting atelectasis/pneumonia. There are few nodular densities in left lower lung field measuring up to 11 mm in diameter. This may be part of atelectasis/pneumonia or underlying lung nodules such as granulomas neoplasm. Follow-up CT chest in 3 months may be considered. Small bilateral pleural effusions.  There is no pneumothorax. There are subcentimeter nodes in mediastinum, possibly suggesting reactive hyperplasia of lymph nodes. Possible gastroesophageal reflux.  LABS REVIEWED:   05-01-22: hgb A1c 4.8; chol 196; ldl 83; trig 96; hdl 93.5 tsh 1.76; vitamin B 12: 844  06-18-22: wbc 12.3; hgb 10.0; hct 30.9; mcv 101.6 plt 185; glucose 112; bun 16; creat 1.05; k+ 3.6; na++ 132; ca 9.1; gfr 52; protein 7.1 albumin 3.8 mag 1.7   Review of Systems  Constitutional:  Negative for malaise/fatigue.  Respiratory:  Negative for cough and shortness of breath.   Cardiovascular:  Negative for chest pain, palpitations and leg swelling.  Gastrointestinal:  Negative for abdominal pain, constipation and heartburn.  Musculoskeletal:  Negative for back pain, joint pain and myalgias.  Skin: Negative.   Neurological:  Negative for dizziness.  Psychiatric/Behavioral:  The patient is not nervous/anxious.    Physical Exam Constitutional:      General: She is not in acute distress.    Appearance: She is well-developed. She is not diaphoretic.  Neck:     Thyroid: No thyromegaly.  Cardiovascular:     Rate and Rhythm: Normal rate. Rhythm  irregular.     Pulses: Normal pulses.     Heart sounds: Normal heart sounds.  Pulmonary:     Effort: Pulmonary effort is normal. No respiratory distress.     Breath sounds: Normal breath sounds.  Abdominal:     General: Bowel sounds are normal. There is no distension.     Palpations: Abdomen is soft.     Tenderness: There is no abdominal tenderness.  Musculoskeletal:        General: Normal range of motion.     Cervical back: Neck supple.     Right lower leg: No edema.     Left lower leg: No edema.  Lymphadenopathy:     Cervical: No cervical adenopathy.  Skin:    General: Skin is warm and dry.  Neurological:     Mental Status: She is alert and oriented to person, place, and time.  Psychiatric:        Mood and Affect: Mood normal.      ASSESSMENT/ PLAN:   Patient is being discharged with the following home health services:  pt/ot to evaluate and treat as indicated for gait balance strength adl training.   Patient is being discharged with the following durable medical equipment:  none needed   Patient has been advised to f/u with their PCP in 1-2 weeks to for a transitions of care visit.  Social services at their facility was responsible for arranging this appointment.  Pt was provided with adequate prescriptions of noncontrolled medications to reach the scheduled appointment .  For controlled substances, a limited supply was provided as appropriate for the individual patient.  If the pt normally receives these medications from a pain clinic or has a contract with another physician, these medications should be received from that clinic or physician only).    A 30 day supply of her prescription medications have been sent to [facility]  Time spent with patient: medications; home health dme.   Synthia Innocent NP [facility]   call [phone]

## 2022-07-05 ENCOUNTER — Other Ambulatory Visit: Payer: Self-pay | Admitting: Adult Health

## 2022-07-05 DIAGNOSIS — E039 Hypothyroidism, unspecified: Secondary | ICD-10-CM

## 2022-07-05 MED ORDER — FLECAINIDE ACETATE 50 MG PO TABS: 50.0000 mg | ORAL_TABLET | Freq: Two times a day (BID) | ORAL | 0 refills | Status: DC

## 2022-07-05 MED ORDER — METHOCARBAMOL 500 MG PO TABS: 500.0000 mg | ORAL_TABLET | Freq: Three times a day (TID) | ORAL | 0 refills | Status: DC

## 2022-07-05 MED ORDER — LEVOTHYROXINE SODIUM 50 MCG PO TABS
50.0000 ug | ORAL_TABLET | Freq: Every day | ORAL | 0 refills | Status: DC
Start: 2022-07-05 — End: 2022-11-06

## 2022-07-05 MED ORDER — CLONIDINE HCL 0.1 MG PO TABS: 0.1000 mg | ORAL_TABLET | Freq: Three times a day (TID) | ORAL | 0 refills | Status: DC

## 2022-07-05 MED ORDER — HYDRALAZINE HCL 100 MG PO TABS: 100.0000 mg | ORAL_TABLET | Freq: Three times a day (TID) | ORAL | 0 refills | Status: DC

## 2022-07-05 MED ORDER — LOSARTAN POTASSIUM 100 MG PO TABS: 100.0000 mg | ORAL_TABLET | Freq: Every day | ORAL | 0 refills | Status: DC

## 2022-07-05 MED ORDER — ATENOLOL 50 MG PO TABS: 50.0000 mg | ORAL_TABLET | Freq: Every day | ORAL | 0 refills | Status: DC

## 2022-07-05 MED ORDER — LINACLOTIDE 72 MCG PO CAPS: 72.0000 ug | ORAL_CAPSULE | ORAL | 0 refills | Status: AC | PRN

## 2022-07-05 MED ORDER — AMLODIPINE BESYLATE 10 MG PO TABS: 10.0000 mg | ORAL_TABLET | Freq: Every day | ORAL | 0 refills | Status: DC

## 2022-07-05 MED ORDER — RIVAROXABAN 15 MG PO TABS: ORAL_TABLET | ORAL | 0 refills | Status: DC

## 2022-07-05 MED ORDER — LATANOPROST 0.005 % OP SOLN: 1.0000 [drp] | Freq: Every day | OPHTHALMIC | 0 refills | Status: DC

## 2022-07-08 ENCOUNTER — Telehealth: Payer: Self-pay | Admitting: Internal Medicine

## 2022-07-08 NOTE — Telephone Encounter (Signed)
noted 

## 2022-07-08 NOTE — Telephone Encounter (Signed)
[name] from [facility] called to inform Dr. Lawerance Bach that patient requested a delay of care until 07/11/2022. Best callback for [name] is [phone].

## 2022-07-10 ENCOUNTER — Ambulatory Visit: Payer: Medicare PPO | Admitting: Internal Medicine

## 2022-07-10 ENCOUNTER — Encounter: Payer: Self-pay | Admitting: Internal Medicine

## 2022-07-11 ENCOUNTER — Telehealth: Payer: Self-pay

## 2022-07-11 DIAGNOSIS — I509 Heart failure, unspecified: Secondary | ICD-10-CM | POA: Diagnosis not present

## 2022-07-11 DIAGNOSIS — I4821 Permanent atrial fibrillation: Secondary | ICD-10-CM | POA: Diagnosis not present

## 2022-07-11 DIAGNOSIS — D631 Anemia in chronic kidney disease: Secondary | ICD-10-CM | POA: Diagnosis not present

## 2022-07-11 DIAGNOSIS — I4892 Unspecified atrial flutter: Secondary | ICD-10-CM | POA: Diagnosis not present

## 2022-07-11 DIAGNOSIS — E039 Hypothyroidism, unspecified: Secondary | ICD-10-CM | POA: Diagnosis not present

## 2022-07-11 DIAGNOSIS — E119 Type 2 diabetes mellitus without complications: Secondary | ICD-10-CM | POA: Diagnosis not present

## 2022-07-11 DIAGNOSIS — I130 Hypertensive heart and chronic kidney disease with heart failure and stage 1 through stage 4 chronic kidney disease, or unspecified chronic kidney disease: Secondary | ICD-10-CM | POA: Diagnosis not present

## 2022-07-11 DIAGNOSIS — S32591D Other specified fracture of right pubis, subsequent encounter for fracture with routine healing: Secondary | ICD-10-CM | POA: Diagnosis not present

## 2022-07-11 DIAGNOSIS — N1832 Chronic kidney disease, stage 3b: Secondary | ICD-10-CM | POA: Diagnosis not present

## 2022-07-11 NOTE — Telephone Encounter (Signed)
Amy [name] with [facility] is calling for verbal orders of PT 1x1 week, 2x3 week, 1x4 week.  Please call Amy [name] at [phone] with verbal ok.

## 2022-07-11 NOTE — Telephone Encounter (Signed)
Okay for orders? 

## 2022-07-11 NOTE — Telephone Encounter (Signed)
Patient called wanting to see Dr. Romeo Apple for a follow up from a ER visit back on 06/16/22. She was then hospitalized on 06/18/22 and then sent to [facility]. I spoke with a lady at [facility] and she stated that there was nothing stating that patient needed to follow up with Dr. Romeo Apple since she had been hospitalized and discharged from [facility] on 07/06/22. The only appointment she was aware the patient had was to follow up with her PCP. I called the patient back and explained what I had found out and her husband was very appreciative for the return call and information.

## 2022-07-12 ENCOUNTER — Ambulatory Visit: Payer: Medicare PPO | Admitting: Internal Medicine

## 2022-07-12 NOTE — Telephone Encounter (Signed)
LVM for [facility] to call back for verbal orders

## 2022-07-13 ENCOUNTER — Encounter: Payer: Self-pay | Admitting: Internal Medicine

## 2022-07-14 MED ORDER — OXYCODONE-ACETAMINOPHEN 5-325 MG PO TABS: 1.0000 | ORAL_TABLET | ORAL | 0 refills | Status: DC | PRN

## 2022-07-15 NOTE — Telephone Encounter (Signed)
LVM for [facility] to call back. No secure mailbox

## 2022-07-16 ENCOUNTER — Encounter: Payer: Self-pay | Admitting: Internal Medicine

## 2022-07-16 NOTE — Progress Notes (Unsigned)
Subjective:    Patient ID: Amy [name], female    DOB: November 05, 1935, 87 y.o.   MRN: [id number]     HPI Amy [name] is here for follow up from rehab.  She is here with her husband and daughter.   Went to ED 06/16/2022-after mechanical fall while walking out of church.  She tripped on the carpet and fell hitting her head.  She was complaining of right hip pain and right knee pain.  There is no LOC.  X-rays of the knee and hip showed no fracture.  X-ray chest showed ill-defined left lower lung disease.  CT head negative for acute bleed or fracture.  CT pelvis showed a nondisplaced fracture of the right superior pubic ramus, nondisplaced fracture of the right sacral ala.  No hip fracture.  She received a prescription for oxycodone and was discharged home-to follow-up with orthopedics as an outpatient.  Admitted 5/28-5/30-return to hospital 2 days later with worsening hip pain, coughing and chills.  She was having difficulty performing her daily activities.  She was having difficulty eating and keeping hydrated.  Chest x-ray showed atelectatic changes and possible community-acquired pneumonia.  She was placed on Rocephin/azithromycin, mucolytic's and bronchodilators.  Discharged on Omnicef, azithromycin.  Orthopedics saw patient and there is no surgical intervention required.  PT evaluation recommended SNF rehab, which is where she was discharged.  Chronic medical problems stable.  He was discharged to [facility] adult center.  She is now at home and has PT coming to her house.  She did contact me for pain medication and I did prescribe a small supply.  Came home 10 days ago.  Initially when she first came home she was moving around more and her pain was controlled.  At some point she started having increased pain more in the left medial buttock region.  She is very uncomfortable and has a hard time sitting, walking and has been in bed primarily-especially the past 5 days.  She  has not had any PT since she came home-PT came out last week and evaluated her.  They recommended 2 times a week for PT and 1 time a week for OT.  She is not able to have it this week and her family is concerned she will continue to get weaker.  She is using a walker at home.  She is not eating as much.  Her pain is severe.  I did send in oxycodone for her and that has helped, but her pain is still not ideally controlled.   No other issues besides the pain and deterioration since coming home from rehab.  Chronic medical problems seem to be stable.      Medications and allergies reviewed with patient and updated if appropriate.  Current Outpatient Medications on File Prior to Visit  Medication Sig Dispense Refill   acetaminophen (TYLENOL) 650 MG CR tablet Take 650 mg by mouth 3 (three) times daily.     AMBULATORY NON FORMULARY MEDICATION Take 1 drop by mouth daily as needed (pain). CBD Oil 1 dose under tongue once daily as needed for pain     amLODipine (NORVASC) 10 MG tablet Take 1 tablet (10 mg total) by mouth daily. 30 tablet 0   atenolol (TENORMIN) 50 MG tablet Take 1 tablet (50 mg total) by mouth daily. 30 tablet 0   Calcium Carbonate-Vitamin D (CALCIUM + D PO) Take 1 tablet by mouth daily.     cloNIDine (CATAPRES) 0.1  MG tablet Take 1 tablet (0.1 mg total) by mouth 3 (three) times daily. 90 tablet 0   flecainide (TAMBOCOR) 50 MG tablet Take 1 tablet (50 mg total) by mouth 2 (two) times daily. 60 tablet 0   hydrALAZINE (APRESOLINE) 100 MG tablet Take 1 tablet (100 mg total) by mouth 3 (three) times daily. 90 tablet 0   latanoprost (XALATAN) 0.005 % ophthalmic solution Place 1 drop into both eyes at bedtime. 2.5 mL 0   levothyroxine (SYNTHROID) 50 MCG tablet Take 1 tablet (50 mcg total) by mouth daily. 30 tablet 0   linaclotide (LINZESS) 72 MCG capsule Take 1 capsule (72 mcg total) by mouth as needed (for constipation). 30 capsule 0   losartan (COZAAR) 100 MG tablet Take 1 tablet (100  mg total) by mouth daily. 30 tablet 0   melatonin 5 MG TABS Take 5 mg by mouth at bedtime.     methocarbamol (ROBAXIN) 500 MG tablet Take 1 tablet (500 mg total) by mouth 3 (three) times daily. 90 tablet 0   oxyCODONE-acetaminophen (PERCOCET/ROXICET) 5-325 MG tablet Take 1-2 tablets by mouth every 4 (four) hours as needed for up to 3 days for severe pain. 15 tablet 0   polyethylene glycol (MIRALAX / GLYCOLAX) 17 g packet Take 17 g by mouth daily. 14 each 0   Rivaroxaban (XARELTO) 15 MG TABS tablet TAKE 1 TABLET BY MOUTH EVERY DAY WITH SUPPER 30 tablet 0   senna-docusate (SENOKOT-S) 8.6-50 MG tablet Take 2 tablets by mouth at bedtime. 60 tablet 2   No current facility-administered medications on file prior to visit.     Review of Systems  Constitutional:  Negative for fever.  Respiratory:  Negative for cough, shortness of breath and wheezing.   Cardiovascular:  Negative for chest pain, palpitations and leg swelling.  Gastrointestinal:  Negative for abdominal pain and constipation.  Musculoskeletal:  Positive for gait problem.       Left buttock pain.  No leg pain  Neurological:  Negative for light-headedness, numbness and headaches.       Objective:   Vitals:   07/17/22 1321  BP: (!) 124/58  Pulse: 99  Temp: 98.1 F (36.7 C)  SpO2: 96%   BP Readings from Last 3 Encounters:  07/17/22 (!) 124/58  07/04/22 129/71  06/26/22 120/60   Wt Readings from Last 3 Encounters:  07/17/22 120 lb (54.4 kg)  07/04/22 124 lb 6.4 oz (56.4 kg)  06/26/22 125 lb 3.2 oz (56.8 kg)   Body mass index is 18.79 kg/m.    Physical Exam Constitutional:      General: She is not in acute distress.    Comments: Thin, fragile.  Uncomfortable from pain in left buttock region, keeps shifting weight in wheelchair  Musculoskeletal:        General: Tenderness (Tenderness with palpation left side of sacrum.  No lateral left hip or left groin pain.  No lumbar spine pain) present.     Right lower leg: No  edema.     Left lower leg: No edema.  Skin:    General: Skin is dry.  Neurological:     Mental Status: She is alert.     Sensory: No sensory deficit.        Lab Results  Component Value Date   WBC 12.3 (H) 06/18/2022   HGB 8.5 (L) 07/04/2022   HCT 27.4 (L) 07/04/2022   PLT 185 06/18/2022   GLUCOSE 77 07/04/2022   CHOL 196 05/01/2022   TRIG  96.0 05/01/2022   HDL 93.50 05/01/2022   LDLDIRECT 84.9 11/15/2008   LDLCALC 83 05/01/2022   ALT 20 06/19/2022   AST 24 06/19/2022   NA 133 (L) 07/04/2022   K 4.0 07/04/2022   CL 103 07/04/2022   CREATININE 1.26 (H) 07/04/2022   BUN 29 (H) 07/04/2022   CO2 21 (L) 07/04/2022   TSH 3.461 06/18/2022   INR 1.0 06/16/2022   HGBA1C 4.8 05/01/2022   DG Sacrum/Coccyx CLINICAL DATA:  Right superior pubic ramus fracture, left buttock pain  EXAM: SACRUM AND COCCYX - 2+ VIEW  COMPARISON:  06/16/2022, 06/19/2022  FINDINGS: Pelvic inlet, pelvic outlet, and lateral views of the sacrum and coccyx are obtained. The right superior ramus fracture seen on prior CT is now minimally displaced, over previously it was nondisplaced in anatomic alignment. No significant callus formation.  The right sacral ala fracture seen on prior CT is not well visualized by x-ray, with the sacrum obscured by overlying bowel gas and stool. Sacroiliac joints are unremarkable. Stable degenerative changes in the lower lumbar spine.  IMPRESSION: 1. Minimal displacement of the right superior pubic ramus fracture seen previously. No significant callus formation. 2. The right sacral ala fracture on prior exam is radiographically occult.  Electronically Signed   By: Sharlet Salina M.D.   On: 07/17/2022 14:51 DG HIPS BILAT WITH PELVIS MIN 5 VIEWS CLINICAL DATA:  Recent nondisplaced fracture of the right superior pubic ramus.  EXAM: DG HIP (WITH OR WITHOUT PELVIS) 5+V BILAT  COMPARISON:  CT of the pelvis Jun 16, 2022  FINDINGS: There is severely displaced  fracture of the right superior inferior pubic ramus. Fracture line extends to the right acetabulum.  Moderate stool burden.  IMPRESSION: Severely displaced fracture of the right superior and inferior pubic ramus, with extension to the right acetabulum.  Electronically Signed   By: Ted Mcalpine M.D.   On: 07/17/2022 14:50    Assessment & Plan:    See Problem List for Assessment and Plan of chronic medical problems.

## 2022-07-16 NOTE — Patient Instructions (Signed)
     Have xrays done today.    Medications changes include :   stop oxycodone  - start tramadol for pain.     No follow-ups on file.

## 2022-07-17 ENCOUNTER — Emergency Department (HOSPITAL_COMMUNITY): Payer: Medicare PPO

## 2022-07-17 ENCOUNTER — Other Ambulatory Visit: Payer: Self-pay

## 2022-07-17 ENCOUNTER — Ambulatory Visit (INDEPENDENT_AMBULATORY_CARE_PROVIDER_SITE_OTHER): Payer: Medicare PPO

## 2022-07-17 ENCOUNTER — Encounter (HOSPITAL_COMMUNITY): Payer: Self-pay

## 2022-07-17 ENCOUNTER — Inpatient Hospital Stay (HOSPITAL_COMMUNITY)
Admission: EM | Admit: 2022-07-17 | Discharge: 2022-07-22 | DRG: 515 | Disposition: A | Discharge status: Skilled Nursing Facility | Payer: Medicare PPO | Attending: Internal Medicine | Admitting: Internal Medicine

## 2022-07-17 ENCOUNTER — Ambulatory Visit (INDEPENDENT_AMBULATORY_CARE_PROVIDER_SITE_OTHER): Payer: Medicare PPO | Admitting: Internal Medicine

## 2022-07-17 VITALS — BP 124/58 | HR 99 | Temp 98.1°F | Ht 67.0 in | Wt 120.0 lb

## 2022-07-17 DIAGNOSIS — Z9889 Other specified postprocedural states: Secondary | ICD-10-CM | POA: Diagnosis not present

## 2022-07-17 DIAGNOSIS — R52 Pain, unspecified: Secondary | ICD-10-CM | POA: Diagnosis not present

## 2022-07-17 DIAGNOSIS — Z66 Do not resuscitate: Secondary | ICD-10-CM | POA: Diagnosis not present

## 2022-07-17 DIAGNOSIS — S32110A Nondisplaced Zone I fracture of sacrum, initial encounter for closed fracture: Secondary | ICD-10-CM | POA: Diagnosis not present

## 2022-07-17 DIAGNOSIS — Z85828 Personal history of other malignant neoplasm of skin: Secondary | ICD-10-CM

## 2022-07-17 DIAGNOSIS — N183 Chronic kidney disease, stage 3 unspecified: Secondary | ICD-10-CM | POA: Diagnosis not present

## 2022-07-17 DIAGNOSIS — I10 Essential (primary) hypertension: Secondary | ICD-10-CM | POA: Diagnosis not present

## 2022-07-17 DIAGNOSIS — D62 Acute posthemorrhagic anemia: Secondary | ICD-10-CM | POA: Diagnosis not present

## 2022-07-17 DIAGNOSIS — I701 Atherosclerosis of renal artery: Secondary | ICD-10-CM | POA: Diagnosis present

## 2022-07-17 DIAGNOSIS — Z79899 Other long term (current) drug therapy: Secondary | ICD-10-CM | POA: Diagnosis not present

## 2022-07-17 DIAGNOSIS — S32811S Multiple fractures of pelvis with unstable disruption of pelvic ring, sequela: Secondary | ICD-10-CM | POA: Diagnosis not present

## 2022-07-17 DIAGNOSIS — Z7901 Long term (current) use of anticoagulants: Secondary | ICD-10-CM

## 2022-07-17 DIAGNOSIS — S32811A Multiple fractures of pelvis with unstable disruption of pelvic ring, initial encounter for closed fracture: Principal | ICD-10-CM

## 2022-07-17 DIAGNOSIS — S329XXD Fracture of unspecified parts of lumbosacral spine and pelvis, subsequent encounter for fracture with routine healing: Secondary | ICD-10-CM | POA: Diagnosis not present

## 2022-07-17 DIAGNOSIS — M7918 Myalgia, other site: Secondary | ICD-10-CM

## 2022-07-17 DIAGNOSIS — S32110D Nondisplaced Zone I fracture of sacrum, subsequent encounter for fracture with routine healing: Secondary | ICD-10-CM | POA: Diagnosis not present

## 2022-07-17 DIAGNOSIS — M533 Sacrococcygeal disorders, not elsewhere classified: Secondary | ICD-10-CM | POA: Diagnosis not present

## 2022-07-17 DIAGNOSIS — E1122 Type 2 diabetes mellitus with diabetic chronic kidney disease: Secondary | ICD-10-CM | POA: Diagnosis present

## 2022-07-17 DIAGNOSIS — W19XXXS Unspecified fall, sequela: Secondary | ICD-10-CM | POA: Diagnosis present

## 2022-07-17 DIAGNOSIS — D631 Anemia in chronic kidney disease: Secondary | ICD-10-CM | POA: Diagnosis not present

## 2022-07-17 DIAGNOSIS — Z800 Family history of malignant neoplasm of digestive organs: Secondary | ICD-10-CM | POA: Diagnosis not present

## 2022-07-17 DIAGNOSIS — M1990 Unspecified osteoarthritis, unspecified site: Secondary | ICD-10-CM | POA: Diagnosis present

## 2022-07-17 DIAGNOSIS — S32591A Other specified fracture of right pubis, initial encounter for closed fracture: Secondary | ICD-10-CM

## 2022-07-17 DIAGNOSIS — E039 Hypothyroidism, unspecified: Secondary | ICD-10-CM | POA: Diagnosis not present

## 2022-07-17 DIAGNOSIS — I4892 Unspecified atrial flutter: Secondary | ICD-10-CM | POA: Diagnosis not present

## 2022-07-17 DIAGNOSIS — I4821 Permanent atrial fibrillation: Secondary | ICD-10-CM | POA: Diagnosis not present

## 2022-07-17 DIAGNOSIS — F32A Depression, unspecified: Secondary | ICD-10-CM | POA: Diagnosis present

## 2022-07-17 DIAGNOSIS — J189 Pneumonia, unspecified organism: Secondary | ICD-10-CM

## 2022-07-17 DIAGNOSIS — Z79890 Hormone replacement therapy (postmenopausal): Secondary | ICD-10-CM | POA: Diagnosis not present

## 2022-07-17 DIAGNOSIS — M6281 Muscle weakness (generalized): Secondary | ICD-10-CM | POA: Diagnosis not present

## 2022-07-17 DIAGNOSIS — M8580 Other specified disorders of bone density and structure, unspecified site: Secondary | ICD-10-CM | POA: Diagnosis not present

## 2022-07-17 DIAGNOSIS — E538 Deficiency of other specified B group vitamins: Secondary | ICD-10-CM | POA: Diagnosis present

## 2022-07-17 DIAGNOSIS — S329XXS Fracture of unspecified parts of lumbosacral spine and pelvis, sequela: Secondary | ICD-10-CM | POA: Diagnosis not present

## 2022-07-17 DIAGNOSIS — W010XXA Fall on same level from slipping, tripping and stumbling without subsequent striking against object, initial encounter: Secondary | ICD-10-CM | POA: Diagnosis not present

## 2022-07-17 DIAGNOSIS — Z8249 Family history of ischemic heart disease and other diseases of the circulatory system: Secondary | ICD-10-CM

## 2022-07-17 DIAGNOSIS — E43 Unspecified severe protein-calorie malnutrition: Secondary | ICD-10-CM | POA: Diagnosis not present

## 2022-07-17 DIAGNOSIS — S3282XA Multiple fractures of pelvis without disruption of pelvic ring, initial encounter for closed fracture: Secondary | ICD-10-CM | POA: Diagnosis not present

## 2022-07-17 DIAGNOSIS — F5101 Primary insomnia: Secondary | ICD-10-CM

## 2022-07-17 DIAGNOSIS — M8448XA Pathological fracture, other site, initial encounter for fracture: Secondary | ICD-10-CM | POA: Diagnosis not present

## 2022-07-17 DIAGNOSIS — S32591D Other specified fracture of right pubis, subsequent encounter for fracture with routine healing: Secondary | ICD-10-CM

## 2022-07-17 DIAGNOSIS — S329XXA Fracture of unspecified parts of lumbosacral spine and pelvis, initial encounter for closed fracture: Secondary | ICD-10-CM | POA: Diagnosis present

## 2022-07-17 DIAGNOSIS — N1832 Chronic kidney disease, stage 3b: Secondary | ICD-10-CM | POA: Diagnosis not present

## 2022-07-17 DIAGNOSIS — R1319 Other dysphagia: Secondary | ICD-10-CM | POA: Diagnosis not present

## 2022-07-17 DIAGNOSIS — S3210XA Unspecified fracture of sacrum, initial encounter for closed fracture: Secondary | ICD-10-CM

## 2022-07-17 DIAGNOSIS — Z823 Family history of stroke: Secondary | ICD-10-CM | POA: Diagnosis not present

## 2022-07-17 DIAGNOSIS — Z743 Need for continuous supervision: Secondary | ICD-10-CM | POA: Diagnosis not present

## 2022-07-17 DIAGNOSIS — R279 Unspecified lack of coordination: Secondary | ICD-10-CM | POA: Diagnosis not present

## 2022-07-17 DIAGNOSIS — S32810A Multiple fractures of pelvis with stable disruption of pelvic ring, initial encounter for closed fracture: Secondary | ICD-10-CM | POA: Diagnosis not present

## 2022-07-17 DIAGNOSIS — W010XXD Fall on same level from slipping, tripping and stumbling without subsequent striking against object, subsequent encounter: Secondary | ICD-10-CM | POA: Diagnosis not present

## 2022-07-17 DIAGNOSIS — M1710 Unilateral primary osteoarthritis, unspecified knee: Secondary | ICD-10-CM | POA: Diagnosis not present

## 2022-07-17 DIAGNOSIS — I4891 Unspecified atrial fibrillation: Secondary | ICD-10-CM | POA: Diagnosis present

## 2022-07-17 DIAGNOSIS — F419 Anxiety disorder, unspecified: Secondary | ICD-10-CM | POA: Diagnosis present

## 2022-07-17 DIAGNOSIS — S3210XD Unspecified fracture of sacrum, subsequent encounter for fracture with routine healing: Secondary | ICD-10-CM | POA: Diagnosis not present

## 2022-07-17 DIAGNOSIS — S32511A Fracture of superior rim of right pubis, initial encounter for closed fracture: Secondary | ICD-10-CM | POA: Diagnosis not present

## 2022-07-17 DIAGNOSIS — E119 Type 2 diabetes mellitus without complications: Secondary | ICD-10-CM | POA: Diagnosis not present

## 2022-07-17 DIAGNOSIS — I129 Hypertensive chronic kidney disease with stage 1 through stage 4 chronic kidney disease, or unspecified chronic kidney disease: Secondary | ICD-10-CM | POA: Diagnosis present

## 2022-07-17 DIAGNOSIS — I130 Hypertensive heart and chronic kidney disease with heart failure and stage 1 through stage 4 chronic kidney disease, or unspecified chronic kidney disease: Secondary | ICD-10-CM | POA: Diagnosis not present

## 2022-07-17 DIAGNOSIS — N1831 Chronic kidney disease, stage 3a: Secondary | ICD-10-CM | POA: Diagnosis not present

## 2022-07-17 DIAGNOSIS — S3289XA Fracture of other parts of pelvis, initial encounter for closed fracture: Secondary | ICD-10-CM | POA: Diagnosis not present

## 2022-07-17 DIAGNOSIS — I509 Heart failure, unspecified: Secondary | ICD-10-CM | POA: Diagnosis not present

## 2022-07-17 DIAGNOSIS — K5730 Diverticulosis of large intestine without perforation or abscess without bleeding: Secondary | ICD-10-CM | POA: Diagnosis not present

## 2022-07-17 LAB — CBC WITH DIFFERENTIAL/PLATELET
Abs Immature Granulocytes: 0.06 10*3/uL (ref 0.00–0.07)
Basophils Absolute: 0.1 10*3/uL (ref 0.0–0.1)
Basophils Relative: 1 %
Eosinophils Absolute: 0 10*3/uL (ref 0.0–0.5)
Eosinophils Relative: 0 %
HCT: 33.5 % — ABNORMAL LOW (ref 36.0–46.0)
Hemoglobin: 10.7 g/dL — ABNORMAL LOW (ref 12.0–15.0)
Immature Granulocytes: 1 %
Lymphocytes Relative: 14 %
Lymphs Abs: 1.1 10*3/uL (ref 0.7–4.0)
MCH: 32.4 pg (ref 26.0–34.0)
MCHC: 31.9 g/dL (ref 30.0–36.0)
MCV: 101.5 fL — ABNORMAL HIGH (ref 80.0–100.0)
Monocytes Absolute: 1.1 10*3/uL — ABNORMAL HIGH (ref 0.1–1.0)
Monocytes Relative: 14 %
Neutro Abs: 5.8 10*3/uL (ref 1.7–7.7)
Neutrophils Relative %: 70 %
Platelets: 358 10*3/uL (ref 150–400)
RBC: 3.3 MIL/uL — ABNORMAL LOW (ref 3.87–5.11)
RDW: 14 % (ref 11.5–15.5)
WBC: 8.1 10*3/uL (ref 4.0–10.5)
nRBC: 0 % (ref 0.0–0.2)

## 2022-07-17 LAB — BASIC METABOLIC PANEL
Anion gap: 13 (ref 5–15)
BUN: 24 mg/dL — ABNORMAL HIGH (ref 8–23)
CO2: 21 mmol/L — ABNORMAL LOW (ref 22–32)
Calcium: 9.6 mg/dL (ref 8.9–10.3)
Chloride: 101 mmol/L (ref 98–111)
Creatinine, Ser: 1.42 mg/dL — ABNORMAL HIGH (ref 0.44–1.00)
GFR, Estimated: 36 mL/min — ABNORMAL LOW (ref >60)
Glucose, Bld: 105 mg/dL — ABNORMAL HIGH (ref 70–99)
Potassium: 3.8 mmol/L (ref 3.5–5.1)
Sodium: 135 mmol/L (ref 135–145)

## 2022-07-17 MED ORDER — ACETAMINOPHEN 325 MG PO TABS: 650.0000 mg | ORAL_TABLET | Freq: Four times a day (QID) | ORAL | Status: DC | PRN

## 2022-07-17 MED ORDER — TRAMADOL HCL 50 MG PO TABS: 50.0000 mg | ORAL_TABLET | Freq: Three times a day (TID) | ORAL | 0 refills | Status: DC | PRN

## 2022-07-17 MED ORDER — ACETAMINOPHEN 650 MG RE SUPP: 650.0000 mg | Freq: Four times a day (QID) | RECTAL | Status: DC | PRN

## 2022-07-17 MED ORDER — ATENOLOL 50 MG PO TABS
50.0000 mg | ORAL_TABLET | Freq: Every day | ORAL | Status: DC
  Administered 2022-07-18 – 2022-07-22 (×5): 50 mg via ORAL
  Filled 2022-07-17 (×5): qty 1

## 2022-07-17 MED ORDER — MELATONIN 5 MG PO TABS
5.0000 mg | ORAL_TABLET | Freq: Every day | ORAL | Status: DC
  Administered 2022-07-18 – 2022-07-21 (×5): 5 mg via ORAL
  Filled 2022-07-17 (×5): qty 1

## 2022-07-17 MED ORDER — CLONIDINE HCL 0.1 MG PO TABS
0.1000 mg | ORAL_TABLET | Freq: Three times a day (TID) | ORAL | Status: DC
  Administered 2022-07-18 – 2022-07-22 (×13): 0.1 mg via ORAL
  Filled 2022-07-17 (×14): qty 1

## 2022-07-17 MED ORDER — AMLODIPINE BESYLATE 10 MG PO TABS
10.0000 mg | ORAL_TABLET | Freq: Every day | ORAL | Status: DC
  Administered 2022-07-18 – 2022-07-22 (×6): 10 mg via ORAL
  Filled 2022-07-17 (×6): qty 1

## 2022-07-17 MED ORDER — SODIUM CHLORIDE 0.9 % IV SOLN: INTRAVENOUS | Status: DC

## 2022-07-17 MED ORDER — FLECAINIDE ACETATE 50 MG PO TABS
50.0000 mg | ORAL_TABLET | Freq: Two times a day (BID) | ORAL | Status: DC
  Administered 2022-07-18 – 2022-07-22 (×10): 50 mg via ORAL
  Filled 2022-07-17 (×11): qty 1

## 2022-07-17 MED ORDER — HYDROMORPHONE HCL 1 MG/ML IJ SOLN
0.5000 mg | Freq: Once | INTRAMUSCULAR | Status: AC
  Administered 2022-07-17: 0.5 mg via INTRAVENOUS
  Filled 2022-07-17: qty 1

## 2022-07-17 MED ORDER — HYDRALAZINE HCL 50 MG PO TABS
100.0000 mg | ORAL_TABLET | Freq: Three times a day (TID) | ORAL | Status: DC
  Administered 2022-07-18 – 2022-07-22 (×14): 100 mg via ORAL
  Filled 2022-07-17 (×14): qty 2

## 2022-07-17 MED ORDER — LOSARTAN POTASSIUM 50 MG PO TABS
100.0000 mg | ORAL_TABLET | Freq: Every day | ORAL | Status: DC
  Administered 2022-07-18 – 2022-07-22 (×5): 100 mg via ORAL
  Filled 2022-07-17 (×5): qty 2

## 2022-07-17 MED ORDER — HEPARIN SODIUM (PORCINE) 5000 UNIT/ML IJ SOLN: 5000.0000 [IU] | Freq: Three times a day (TID) | INTRAMUSCULAR | Status: DC

## 2022-07-17 MED ORDER — HYDROMORPHONE HCL 1 MG/ML IJ SOLN
0.5000 mg | INTRAMUSCULAR | Status: DC | PRN
  Administered 2022-07-18 – 2022-07-19 (×5): 0.5 mg via INTRAVENOUS
  Filled 2022-07-17 (×5): qty 0.5

## 2022-07-17 MED ORDER — LEVOTHYROXINE SODIUM 50 MCG PO TABS
50.0000 ug | ORAL_TABLET | Freq: Every day | ORAL | Status: DC
  Administered 2022-07-18 – 2022-07-22 (×5): 50 ug via ORAL
  Filled 2022-07-17 (×5): qty 1

## 2022-07-17 MED ORDER — AMLODIPINE BESYLATE 5 MG PO TABS: 10.0000 mg | ORAL_TABLET | Freq: Every day | ORAL | Status: DC

## 2022-07-17 MED ORDER — DOCUSATE SODIUM 100 MG PO CAPS
100.0000 mg | ORAL_CAPSULE | Freq: Two times a day (BID) | ORAL | Status: DC
  Administered 2022-07-18 (×3): 100 mg via ORAL
  Filled 2022-07-17 (×3): qty 1

## 2022-07-17 NOTE — ED Notes (Signed)
ED TO INPATIENT HANDOFF REPORT  ED Nurse Name and Phone #:   S Name/Age/Gender Amy [name] 87 y.o. female Room/Bed: 002C/002C  Code Status   Code Status: Full Code  Home/SNF/Other Home Patient oriented to: self, place, and time Is this baseline? Yes   Triage Complete: Triage complete  Chief Complaint Pelvic fracture (HCC) [S32.9XXA]  Triage Note Pt came in via POV d/t bil hip pain rated 10/10 while in triage. Pt reports that she fell on May 26th & was brought to [facility] & she was released from rehab for a Fx to her Rt hip approx 1.5 weeks ago. She arrives to ED today with c/o worsening pain with both hips bothering her & the Lt one more than the Rt this time. Denies any more falls since d/c from rehab & says laying flat on her back seems to be the only position that relieves her pain.     Allergies No Known Allergies  Level of Care/Admitting Diagnosis ED Disposition     ED Disposition  Admit   Condition  --   Comment  Hospital Area: [facility] [[id number]]  Level of Care: Med-Surg [16]  May admit patient to [facility] or [facility] if equivalent level of care is available:: No  Covid Evaluation: Confirmed COVID Negative  Diagnosis: Pelvic fracture [facility]) [[id number]]  Admitting Physician: Alvester Chou  Attending Physician: Gery Pray [[id number]]  Certification:: I certify this patient will need inpatient services for at least 2 midnights  Estimated Length of Stay: 2          B Medical/Surgery History 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   Right Malar Cheek   Renal artery stenosis [facility]    Past Surgical History:  Procedure Laterality Date   A FLUTTER ABLATION     KNEE ARTHROSCOPY  Right    PERIPHERAL VASCULAR INTERVENTION Right 02/13/2021   Procedure: PERIPHERAL VASCULAR INTERVENTION;  Surgeon: Nada Libman, MD;  Location: [facility] INVASIVE CV LAB;  Service: Cardiovascular;  Laterality: Right;   RENAL ANGIOGRAPHY N/A 02/13/2021   Procedure: RENAL ANGIOGRAPHY;  Surgeon: Nada Libman, MD;  Location: [facility] INVASIVE CV LAB;  Service: Cardiovascular;  Laterality: N/A;   TUBAL LIGATION       A IV Location/Drains/Wounds Patient Lines/Drains/Airways Status     Active Line/Drains/Airways     Name Placement date Placement time Site Days   Peripheral IV 07/17/22 20 G Right Antecubital 07/17/22  2006  Antecubital  less than 1            Intake/Output Last 24 hours No intake or output data in the 24 hours ending 07/17/22 2245  Labs/Imaging No results found for this or any previous visit (from the past 48 hour(s)). CT PELVIS WO CONTRAST  Result Date: 07/17/2022 CLINICAL DATA:  Pelvic fracture, progressive pelvic pain EXAM: CT PELVIS WITHOUT CONTRAST TECHNIQUE: Multidetector CT imaging of the pelvis was performed following the standard protocol without intravenous contrast. RADIATION DOSE REDUCTION: This exam was performed according to the departmental dose-optimization program which includes automated exposure control, adjustment of the mA and/or kV according to patient size and/or use of iterative reconstruction technique. COMPARISON:  06/16/2022 FINDINGS: Urinary Tract:  No abnormality visualized. Bowel: Extensive sigmoid diverticulosis without superimposed acute inflammatory change. Moderate stool within the rectal vault. Visualized large and small bowel are otherwise unremarkable. Appendix normal. No free intraperitoneal fluid. Vascular/Lymphatic: Mild atherosclerotic calcification within the aortoiliac vasculature. No aneurysm. No pathologic adenopathy within the abdomen and pelvis. Reproductive:  Pelvic organs are unremarkable. Other:  No abdominal wall hernia.  Musculoskeletal: The osseous structures are diffusely osteopenic. Since the prior examination, there has developed resorption along the vertical fracture plane of the right sacral ala as well as report or shin and increasing fragmentation involving the right superior pubic ramus fracture with increasing displacement of the fracture fragments suggesting motion at this level. There is no significant bridging callus identified at either location. Additionally, there has developed a mildly displaced of the right iliac spine as well as a segmental fracture of the right inferior pubic ramus with mild anteroinferior displacement. A minimally displaced sagittally oriented fracture of the left sacral ala has developed in the interval. No dislocation. There is progressive soft tissue swelling surrounding the right superior pubic ramus fracture which may relate to local edema or hemorrhage. IMPRESSION: 1. Interval development of resorption along the vertical fracture plane of the right sacral ala as well as increasing fragmentation involving the right superior pubic ramus fracture with increasing displacement of the fracture fragments suggesting motion at this level. No significant bridging callus identified at either location. Progressive soft tissue swelling surrounding the superior pubic ramus fracture. 2. Interval development of a mildly displaced of the right iliac spine as well as a segmental fracture of the right inferior pubic ramus with mild anteroinferior displacement. This was not present on prior examination and suggests recurrent injury. 3. Interval development of a minimally displaced sagittally oriented fracture of the left sacral ala. 4. Extensive sigmoid diverticulosis without superimposed acute inflammatory change. 5. Aortic atherosclerosis. Aortic Atherosclerosis (ICD10-I70.0). Electronically Signed   By: Helyn Numbers M.D.   On: 07/17/2022 21:09   DG Sacrum/Coccyx  Result Date: 07/17/2022 CLINICAL  DATA:  Right superior pubic ramus fracture, left buttock pain EXAM: SACRUM AND COCCYX - 2+ VIEW COMPARISON:  06/16/2022, 06/19/2022 FINDINGS: Pelvic inlet, pelvic outlet, and lateral views of the sacrum and coccyx are obtained. The right superior ramus fracture seen on prior CT is now minimally displaced, over previously it was nondisplaced in anatomic alignment. No significant callus formation. The right sacral ala fracture seen on prior CT is not well visualized by x-ray, with the sacrum obscured by overlying bowel gas and stool. Sacroiliac joints are unremarkable. Stable degenerative changes in the lower lumbar spine. IMPRESSION: 1. Minimal displacement of the right superior pubic ramus fracture seen previously. No significant callus formation. 2. The right sacral ala fracture on prior exam is radiographically occult. Electronically Signed   By: Sharlet Salina M.D.   On: 07/17/2022 14:51   DG HIPS BILAT WITH PELVIS MIN 5 VIEWS  Result Date: 07/17/2022 CLINICAL DATA:  Recent nondisplaced fracture of the right superior pubic ramus. EXAM: DG HIP (WITH OR WITHOUT PELVIS) 5+V BILAT COMPARISON:  CT of the pelvis Jun 16, 2022 FINDINGS: There is severely displaced fracture of the right superior inferior pubic ramus. Fracture line extends to the right acetabulum. Moderate stool burden. IMPRESSION: Severely displaced fracture of the right superior and inferior pubic ramus, with extension to the right acetabulum. Electronically Signed   By: Ted Mcalpine M.D.   On: 07/17/2022 14:50    Pending Labs Unresulted Labs (From admission, onward)  Start     Ordered   07/18/22 0500  Comprehensive metabolic panel  Tomorrow morning,   R        07/17/22 2240   07/18/22 0500  CBC with Differential/Platelet  Tomorrow morning,   R        07/17/22 2240   07/18/22 0500  Protime-INR  Tomorrow morning,   R        07/17/22 2243   07/17/22 2241  CBC  (heparin)  Once,   R       Comments: Baseline for heparin therapy IF  NOT ALREADY DRAWN.  Notify MD if PLT < 100 K.    07/17/22 2243   07/17/22 2225  Basic metabolic panel  Once,   STAT        07/17/22 2224   07/17/22 2225  CBC with Differential  Once,   STAT        07/17/22 2224   07/17/22 1952  Urinalysis, Routine w reflex microscopic -Urine, Clean Catch  Once,   URGENT       Question:  Specimen Source  Answer:  Urine, Clean Catch   07/17/22 1952            Vitals/Pain Today's Vitals   07/17/22 1945 07/17/22 2000 07/17/22 2211 07/17/22 2240  BP: (!) 148/77 (!) 145/69    Pulse: 100 81    Resp:  18    Temp:    98.3 F (36.8 C)  TempSrc:      SpO2: 96% 99%    PainSc:   8      Isolation Precautions No active isolations  Medications Medications  0.9 %  sodium chloride infusion (has no administration in time range)  heparin injection 5,000 Units (has no administration in time range)  acetaminophen (TYLENOL) tablet 650 mg (has no administration in time range)    Or  acetaminophen (TYLENOL) suppository 650 mg (has no administration in time range)  docusate sodium (COLACE) capsule 100 mg (has no administration in time range)  HYDROmorphone (DILAUDID) injection 0.5 mg (0.5 mg Intravenous Given 07/17/22 2212)    Mobility non-ambulatory     Focused Assessments    R Recommendations: See Admitting Provider Note  Report given to:   Additional Notes:

## 2022-07-17 NOTE — Assessment & Plan Note (Signed)
Recent episode of pneumonia Completed antibiotics No concerning symptoms

## 2022-07-17 NOTE — Assessment & Plan Note (Signed)
Chronic Blood pressure controlled Continue amlodipine 10 mg daily, atenolol 50 mg daily, clonidine 0.3 mg 3 times daily, hydralazine 100 mg 3 times daily, losartan 100 mg daily

## 2022-07-17 NOTE — ED Triage Notes (Addendum)
Pt came in via POV d/t bil hip pain rated 10/10 while in triage. Pt reports that she fell on May 26th & was brought to [facility] & she was released from rehab for a Fx to her Rt hip approx 1.5 weeks ago. She arrives to ED today with c/o worsening pain with both hips bothering her & the Lt one more than the Rt this time. Denies any more falls since d/c from rehab & says laying flat on her back seems to be the only position that relieves her pain.

## 2022-07-17 NOTE — Assessment & Plan Note (Signed)
Recent pelvic fracture, sacral fracture Nonoperative Did well in rehab, but when she came home started to have increased pain Didiano severe X-rays today show severely displaced right superior and inferior pubic ramus fracture with extension to the right acetabulum Consulted with Dr. Katrinka Blazing with sports medicine and he did review the x-ray and recommended she go to the emergency room due to the instability of the fracture I have instructed patient's daughter to take her to the ED for further evaluation and orthopedics

## 2022-07-17 NOTE — Assessment & Plan Note (Signed)
X-ray today shows sacral fracture is occult

## 2022-07-17 NOTE — H&P (Addendum)
PCP:   Pincus Sanes, MD   Chief Complaint:  Progressive left-sided hip pain  HPI: This is a 87 year old female with past medical history of HTN, atrial fibrillation/flutter (sp ablation, maintained on Tambocor and Xarelto), hypothyroidism, T2DM  w/ nephropathy, and CKD IIIb.  Her last dose of Xarelto was 06/14/2022.  On Sunday 06/16/2022 patient tripped on a carpet while leaving church.  The, fall resulted in pelvic bone fracture.  CT readings Nondisplaced fracture of the right superior pubic ramus. Nondisplaced fracture of the right sacral ala. Patient was discharged to [facility] rehab where she spent the last month.  Patient discharged home on Saturday.  At the time of discharge patient's pain was minimal.  Since being home developed in her left hip and has progressively worsened.  She denies any recurrent falls or injury.  She came to the ER.  Imaging done on the ER imaging shows worsening of her pelvis ramus fracture plus new fractures.  Review of Systems:  Per HPI  Past Medical History: 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   Right Malar Cheek   Renal artery stenosis [facility])    Past Surgical History:  Procedure Laterality Date   A FLUTTER ABLATION     KNEE ARTHROSCOPY Right    PERIPHERAL VASCULAR INTERVENTION Right 02/13/2021   Procedure: PERIPHERAL VASCULAR INTERVENTION;  Surgeon: Nada Libman, MD;  Location: [facility] INVASIVE CV LAB;  Service: Cardiovascular;  Laterality: Right;   RENAL ANGIOGRAPHY N/A 02/13/2021   Procedure: RENAL ANGIOGRAPHY;  Surgeon: Nada Libman, MD;  Location: [facility] INVASIVE CV LAB;  Service: Cardiovascular;  Laterality: N/A;   TUBAL LIGATION      Medications: Prior to Admission medications   Medication Sig  Start Date End Date Taking? Authorizing Provider  acetaminophen (TYLENOL) 650 MG CR tablet Take 650 mg by mouth 3 (three) times daily.    [provider]  AMBULATORY NON FORMULARY MEDICATION Take 1 drop by mouth daily as needed (pain). CBD Oil 1 dose under tongue once daily as needed for pain    [provider]  amLODipine (NORVASC) 10 MG tablet Take 1 tablet (10 mg total) by mouth daily. 07/05/22   Sharee Holster, NP  atenolol (TENORMIN) 50 MG tablet Take 1 tablet (50 mg total) by mouth daily. 07/05/22   Sharee Holster, NP  Calcium Carbonate-Vitamin D (CALCIUM + D PO) Take 1 tablet by mouth daily.    [provider]  cloNIDine (CATAPRES) 0.1 MG tablet Take 1 tablet (0.1 mg total) by mouth 3 (three) times daily. 07/05/22   Sharee Holster, NP  flecainide (TAMBOCOR) 50 MG tablet Take 1 tablet (50 mg total) by mouth 2 (two) times daily. 07/05/22   Sharee Holster, NP  hydrALAZINE (APRESOLINE) 100 MG tablet Take 1 tablet (100 mg total) by mouth 3 (three) times daily. 07/05/22   Sharee Holster, NP  latanoprost (XALATAN) 0.005 % ophthalmic solution Place 1 drop into both eyes at bedtime. 07/05/22   Sharee Holster, NP  levothyroxine (SYNTHROID) 50 MCG tablet Take 1 tablet (50 mcg total) by mouth daily. 07/05/22   Sharee Holster, NP  linaclotide Karlene Einstein) 72 MCG capsule  Take 1 capsule (72 mcg total) by mouth as needed (for constipation). 07/05/22   Sharee Holster, NP  losartan (COZAAR) 100 MG tablet Take 1 tablet (100 mg total) by mouth daily. 07/05/22   Sharee Holster, NP  melatonin 5 MG TABS Take 5 mg by mouth at bedtime.    [provider]  methocarbamol (ROBAXIN) 500 MG tablet Take 1 tablet (500 mg total) by mouth 3 (three) times daily. 07/05/22   Sharee Holster, NP  polyethylene glycol (MIRALAX / GLYCOLAX) 17 g packet Take 17 g by mouth daily. 06/21/22   Shon Hale, MD  Rivaroxaban (XARELTO) 15 MG TABS tablet TAKE 1 TABLET BY MOUTH EVERY DAY WITH SUPPER  07/05/22   Sharee Holster, NP  senna-docusate (SENOKOT-S) 8.6-50 MG tablet Take 2 tablets by mouth at bedtime. 06/20/22   Shon Hale, MD  traMADol (ULTRAM) 50 MG tablet Take 1 tablet (50 mg total) by mouth every 8 (eight) hours as needed. 07/17/22   Pincus Sanes, MD    Allergies:  No Known Allergies  Social History:  reports that she has never smoked. She has never been exposed to tobacco smoke. She has never used smokeless tobacco. She reports that she does not drink alcohol and does not use drugs.  Family History: Family History  Problem Relation Age of Onset   Stroke Father 93       died from stroke at age 63   Stroke Mother 70       died from stroke at age 70   Heart disease Mother    Colon cancer Brother     Physical Exam: Vitals:   07/17/22 1822 07/17/22 1945 07/17/22 2000  BP: (!) 157/73 (!) 148/77 (!) 145/69  Pulse: 89 100 81  Resp: 19  18  Temp: 97.9 F (36.6 C)    TempSrc: Oral    SpO2: 97% 96% 99%    General:  Alert and oriented times three, well developed and nourished, no acute distress Eyes: Pink conjunctiva, no scleral icterus ENT: Moist oral mucosa, neck supple, no thyromegaly Lungs: clear to ascultation, no wheeze, no crackles, no use of accessory muscles Cardiovascular: regular rate and rhythm, no regurgitation, no gallops, no murmurs. No carotid bruits, no JVD Abdomen: soft, positive BS, non-tender, non-distended, no organomegaly, not an acute abdomen GU: not examined Neuro: CN II - XII grossly intact, sensation intact Musculoskeletal: Moves bilateral lower extremity, causing hip pain. Skin: Bruising right knee Psych: appropriate patient  Labs on Admission:  Reviewed  Radiological Exams on Admission: CT PELVIS WO CONTRAST  Result Date: 07/17/2022 CLINICAL DATA:  Pelvic fracture, progressive pelvic pain EXAM: CT PELVIS WITHOUT CONTRAST TECHNIQUE: Multidetector CT imaging of the pelvis was performed following the standard protocol without  intravenous contrast. RADIATION DOSE REDUCTION: This exam was performed according to the departmental dose-optimization program which includes automated exposure control, adjustment of the mA and/or kV according to patient size and/or use of iterative reconstruction technique. COMPARISON:  06/16/2022 FINDINGS: Urinary Tract:  No abnormality visualized. Bowel: Extensive sigmoid diverticulosis without superimposed acute inflammatory change. Moderate stool within the rectal vault. Visualized large and small bowel are otherwise unremarkable. Appendix normal. No free intraperitoneal fluid. Vascular/Lymphatic: Mild atherosclerotic calcification within the aortoiliac vasculature. No aneurysm. No pathologic adenopathy within the abdomen and pelvis. Reproductive:  Pelvic organs are unremarkable. Other:  No abdominal wall hernia. Musculoskeletal: The osseous structures are diffusely osteopenic. Since the prior examination, there has developed resorption along the vertical fracture plane of the  right sacral ala as well as report or shin and increasing fragmentation involving the right superior pubic ramus fracture with increasing displacement of the fracture fragments suggesting motion at this level. There is no significant bridging callus identified at either location. Additionally, there has developed a mildly displaced of the right iliac spine as well as a segmental fracture of the right inferior pubic ramus with mild anteroinferior displacement. A minimally displaced sagittally oriented fracture of the left sacral ala has developed in the interval. No dislocation. There is progressive soft tissue swelling surrounding the right superior pubic ramus fracture which may relate to local edema or hemorrhage. IMPRESSION: 1. Interval development of resorption along the vertical fracture plane of the right sacral ala as well as increasing fragmentation involving the right superior pubic ramus fracture with increasing displacement  of the fracture fragments suggesting motion at this level. No significant bridging callus identified at either location. Progressive soft tissue swelling surrounding the superior pubic ramus fracture. 2. Interval development of a mildly displaced of the right iliac spine as well as a segmental fracture of the right inferior pubic ramus with mild anteroinferior displacement. This was not present on prior examination and suggests recurrent injury. 3. Interval development of a minimally displaced sagittally oriented fracture of the left sacral ala. 4. Extensive sigmoid diverticulosis without superimposed acute inflammatory change. 5. Aortic atherosclerosis. Aortic Atherosclerosis (ICD10-I70.0). Electronically Signed   By: Helyn Numbers M.D.   On: 07/17/2022 21:09   DG Sacrum/Coccyx  Result Date: 07/17/2022 CLINICAL DATA:  Right superior pubic ramus fracture, left buttock pain EXAM: SACRUM AND COCCYX - 2+ VIEW COMPARISON:  06/16/2022, 06/19/2022 FINDINGS: Pelvic inlet, pelvic outlet, and lateral views of the sacrum and coccyx are obtained. The right superior ramus fracture seen on prior CT is now minimally displaced, over previously it was nondisplaced in anatomic alignment. No significant callus formation. The right sacral ala fracture seen on prior CT is not well visualized by x-ray, with the sacrum obscured by overlying bowel gas and stool. Sacroiliac joints are unremarkable. Stable degenerative changes in the lower lumbar spine. IMPRESSION: 1. Minimal displacement of the right superior pubic ramus fracture seen previously. No significant callus formation. 2. The right sacral ala fracture on prior exam is radiographically occult. Electronically Signed   By: Sharlet Salina M.D.   On: 07/17/2022 14:51   DG HIPS BILAT WITH PELVIS MIN 5 VIEWS  Result Date: 07/17/2022 CLINICAL DATA:  Recent nondisplaced fracture of the right superior pubic ramus. EXAM: DG HIP (WITH OR WITHOUT PELVIS) 5+V BILAT COMPARISON:  CT of  the pelvis Jun 16, 2022 FINDINGS: There is severely displaced fracture of the right superior inferior pubic ramus. Fracture line extends to the right acetabulum. Moderate stool burden. IMPRESSION: Severely displaced fracture of the right superior and inferior pubic ramus, with extension to the right acetabulum. Electronically Signed   By: Ted Mcalpine M.D.   On: 07/17/2022 14:50    IMPRESSION: CT pelvis done 06/16/22 1. Nondisplaced fracture of the right superior pubic ramus. 2. Nondisplaced fracture of the right sacral ala. 3. No evidence of femoral neck fracture. 4. Advanced sigmoid colonic diverticulosis without evidence of acute diverticulitis.   Assessment/Plan Present on Admission:  Pelvic fracture (HCC) // Extension of existing pelvic fractures along with new fractures with displacement // intractable pain -Pain control initiated -Orthopedics on-call Dr Victorino Dike contacted by EDP.  Dr Victorino Dike recommends admission.  Dr. Carola Frost will see her in the a.m. to determine if she is a candidate for surgical  interventio -N.p.o. at midnight -Patient on Xarelto, last taken 03/16/22.  This will be held -Gentle IV fluid hydration -PT consult placed   Acquired hypothyroidism -Continue Synthroid   Atrial fibrillation (HCC) -Hold Xarelto -Continue flecainide   Essential hypertension -Continue hydralazine, atenolol, clonidine, Norvasc, and losartan   Stage 3a CKD (HCC) -Stable at baseline   Karmel Patricelli 07/17/2022, 10:35 PM

## 2022-07-17 NOTE — Assessment & Plan Note (Signed)
Chronic Cannot take trazodone while on flecainide Currently taking melatonin and she is sleeping okay Continue

## 2022-07-17 NOTE — Assessment & Plan Note (Signed)
Acute Started after getting home from rehab She is very tender on exam X-rays obtained-see above Tramadol prescribed for pain which was okayed by cardiology (on flecainide) Also taking methocarbamol as needed With recent x-ray pain management per hospital

## 2022-07-17 NOTE — ED Provider Notes (Signed)
[place] [facility] AT [facility] Provider Note   CSN: [id number] Arrival date & time: 07/17/22  1814     History  Chief Complaint  Patient presents with   Hip Pain    Amy [name] is a 87 y.o. female.  HPI Pt came in via POV d/t bil hip pain rated 10/10 while in triage. Pt reports that she fell on May 26th & was brought to [facility] & she was released from rehab for a Fx to her Rt hip approx 1.5 weeks ago. She arrives to ED today with c/o worsening pain with both hips bothering her & the Lt one more than the Rt this time. Denies any more falls since d/c from rehab & says laying flat on her back seems to be the only position that relieves her pain.  Patient reports pain is very severe at this point if she tries to ambulate and use her walker or even in a seated position.  She has been taking 2 Percocet every 4 hours as needed for pain control but reports is not really helping much.    Home Medications Prior to Admission medications   Medication Sig Start Date End Date Taking? Authorizing Provider  acetaminophen (TYLENOL) 650 MG CR tablet Take 650 mg by mouth 3 (three) times daily.    [provider]  AMBULATORY NON FORMULARY MEDICATION Take 1 drop by mouth daily as needed (pain). CBD Oil 1 dose under tongue once daily as needed for pain    [provider]  amLODipine (NORVASC) 10 MG tablet Take 1 tablet (10 mg total) by mouth daily. 07/05/22   Sharee Holster, NP  atenolol (TENORMIN) 50 MG tablet Take 1 tablet (50 mg total) by mouth daily. 07/05/22   Sharee Holster, NP  Calcium Carbonate-Vitamin D (CALCIUM + D PO) Take 1 tablet by mouth daily.    [provider]  cloNIDine (CATAPRES) 0.1 MG tablet Take 1 tablet (0.1 mg total) by mouth 3 (three) times daily. 07/05/22   Sharee Holster, NP  flecainide (TAMBOCOR) 50 MG tablet Take 1 tablet (50 mg total) by mouth 2 (two) times daily. 07/05/22   Sharee Holster, NP  hydrALAZINE  (APRESOLINE) 100 MG tablet Take 1 tablet (100 mg total) by mouth 3 (three) times daily. 07/05/22   Sharee Holster, NP  latanoprost (XALATAN) 0.005 % ophthalmic solution Place 1 drop into both eyes at bedtime. 07/05/22   Sharee Holster, NP  levothyroxine (SYNTHROID) 50 MCG tablet Take 1 tablet (50 mcg total) by mouth daily. 07/05/22   Sharee Holster, NP  linaclotide Karlene Einstein) 72 MCG capsule Take 1 capsule (72 mcg total) by mouth as needed (for constipation). 07/05/22   Sharee Holster, NP  losartan (COZAAR) 100 MG tablet Take 1 tablet (100 mg total) by mouth daily. 07/05/22   Sharee Holster, NP  melatonin 5 MG TABS Take 5 mg by mouth at bedtime.    [provider]  methocarbamol (ROBAXIN) 500 MG tablet Take 1 tablet (500 mg total) by mouth 3 (three) times daily. 07/05/22   Sharee Holster, NP  polyethylene glycol (MIRALAX / GLYCOLAX) 17 g packet Take 17 g by mouth daily. 06/21/22   Shon Hale, MD  Rivaroxaban (XARELTO) 15 MG TABS tablet TAKE 1 TABLET BY MOUTH EVERY DAY WITH SUPPER 07/05/22   Sharee Holster, NP  senna-docusate (SENOKOT-S) 8.6-50 MG tablet Take 2 tablets by mouth at bedtime. 06/20/22   Emokpae,  Courage, MD  traMADol (ULTRAM) 50 MG tablet Take 1 tablet (50 mg total) by mouth every 8 (eight) hours as needed. 07/17/22   Pincus Sanes, MD      Allergies    Patient has no known allergies.    Review of Systems   Review of Systems  Physical Exam Updated Vital Signs BP (!) 145/69   Pulse 81   Temp 97.9 F (36.6 C) (Oral)   Resp 18   LMP  (LMP Unknown)   SpO2 99%  Physical Exam Constitutional:      Comments: Alert nontoxic.  Mental status clear.  No respiratory distress.  Well-nourished well-developed.  HENT:     Mouth/Throat:     Pharynx: Oropharynx is clear.  Eyes:     Extraocular Movements: Extraocular movements intact.  Cardiovascular:     Rate and Rhythm: Normal rate and regular rhythm.  Pulmonary:     Effort: Pulmonary effort is normal.     Breath  sounds: Normal breath sounds.  Abdominal:     General: There is no distension.     Palpations: Abdomen is soft.     Tenderness: There is no abdominal tenderness. There is no guarding.  Musculoskeletal:     Comments: No lower extremity swelling or calf tenderness.  Feet are warm and dry.  Pain with range of motion of the right hip.  Patient's position of comfort is supine.  Skin:    General: Skin is warm and dry.  Neurological:     General: No focal deficit present.     Mental Status: She is oriented to person, place, and time.  Psychiatric:        Mood and Affect: Mood normal.     ED Results / Procedures / Treatments   Labs (all labs ordered are listed, but only abnormal results are displayed) Labs Reviewed  URINALYSIS, ROUTINE W REFLEX MICROSCOPIC    EKG None  Radiology CT PELVIS WO CONTRAST  Result Date: 07/17/2022 CLINICAL DATA:  Pelvic fracture, progressive pelvic pain EXAM: CT PELVIS WITHOUT CONTRAST TECHNIQUE: Multidetector CT imaging of the pelvis was performed following the standard protocol without intravenous contrast. RADIATION DOSE REDUCTION: This exam was performed according to the departmental dose-optimization program which includes automated exposure control, adjustment of the mA and/or kV according to patient size and/or use of iterative reconstruction technique. COMPARISON:  06/16/2022 FINDINGS: Urinary Tract:  No abnormality visualized. Bowel: Extensive sigmoid diverticulosis without superimposed acute inflammatory change. Moderate stool within the rectal vault. Visualized large and small bowel are otherwise unremarkable. Appendix normal. No free intraperitoneal fluid. Vascular/Lymphatic: Mild atherosclerotic calcification within the aortoiliac vasculature. No aneurysm. No pathologic adenopathy within the abdomen and pelvis. Reproductive:  Pelvic organs are unremarkable. Other:  No abdominal wall hernia. Musculoskeletal: The osseous structures are diffusely  osteopenic. Since the prior examination, there has developed resorption along the vertical fracture plane of the right sacral ala as well as report or shin and increasing fragmentation involving the right superior pubic ramus fracture with increasing displacement of the fracture fragments suggesting motion at this level. There is no significant bridging callus identified at either location. Additionally, there has developed a mildly displaced of the right iliac spine as well as a segmental fracture of the right inferior pubic ramus with mild anteroinferior displacement. A minimally displaced sagittally oriented fracture of the left sacral ala has developed in the interval. No dislocation. There is progressive soft tissue swelling surrounding the right superior pubic ramus fracture which may relate to local  edema or hemorrhage. IMPRESSION: 1. Interval development of resorption along the vertical fracture plane of the right sacral ala as well as increasing fragmentation involving the right superior pubic ramus fracture with increasing displacement of the fracture fragments suggesting motion at this level. No significant bridging callus identified at either location. Progressive soft tissue swelling surrounding the superior pubic ramus fracture. 2. Interval development of a mildly displaced of the right iliac spine as well as a segmental fracture of the right inferior pubic ramus with mild anteroinferior displacement. This was not present on prior examination and suggests recurrent injury. 3. Interval development of a minimally displaced sagittally oriented fracture of the left sacral ala. 4. Extensive sigmoid diverticulosis without superimposed acute inflammatory change. 5. Aortic atherosclerosis. Aortic Atherosclerosis (ICD10-I70.0). Electronically Signed   By: Helyn Numbers M.D.   On: 07/17/2022 21:09   DG Sacrum/Coccyx  Result Date: 07/17/2022 CLINICAL DATA:  Right superior pubic ramus fracture, left buttock  pain EXAM: SACRUM AND COCCYX - 2+ VIEW COMPARISON:  06/16/2022, 06/19/2022 FINDINGS: Pelvic inlet, pelvic outlet, and lateral views of the sacrum and coccyx are obtained. The right superior ramus fracture seen on prior CT is now minimally displaced, over previously it was nondisplaced in anatomic alignment. No significant callus formation. The right sacral ala fracture seen on prior CT is not well visualized by x-ray, with the sacrum obscured by overlying bowel gas and stool. Sacroiliac joints are unremarkable. Stable degenerative changes in the lower lumbar spine. IMPRESSION: 1. Minimal displacement of the right superior pubic ramus fracture seen previously. No significant callus formation. 2. The right sacral ala fracture on prior exam is radiographically occult. Electronically Signed   By: Sharlet Salina M.D.   On: 07/17/2022 14:51   DG HIPS BILAT WITH PELVIS MIN 5 VIEWS  Result Date: 07/17/2022 CLINICAL DATA:  Recent nondisplaced fracture of the right superior pubic ramus. EXAM: DG HIP (WITH OR WITHOUT PELVIS) 5+V BILAT COMPARISON:  CT of the pelvis Jun 16, 2022 FINDINGS: There is severely displaced fracture of the right superior inferior pubic ramus. Fracture line extends to the right acetabulum. Moderate stool burden. IMPRESSION: Severely displaced fracture of the right superior and inferior pubic ramus, with extension to the right acetabulum. Electronically Signed   By: Ted Mcalpine M.D.   On: 07/17/2022 14:50    Procedures Procedures    Medications Ordered in ED Medications  HYDROmorphone (DILAUDID) injection 0.5 mg (0.5 mg Intravenous Given 07/17/22 2212)    ED Course/ Medical Decision Making/ A&P                             Medical Decision Making Amount and/or Complexity of Data Reviewed Labs: ordered. Radiology: ordered.  Risk Prescription drug management. Decision regarding hospitalization.   Patient presents with worsening functional status after pelvic fracture at  the end of May.  She was seen on outpatient basis today and there appeared to be instability to the previous fracture.  Patient is alert and appropriate.  She is neurovascularly intact.  Will proceed with CT scan of the pelvis to further clarify fracture morphology.  CT scan reviewed by myself and radiology interpretation reviewed positive for nonhealing and increasing displacement of fracture fragments.  Consult: Reviewed with Dr. Victorino Dike.  Dr. Victorino Dike advises admission to hospitalist service and Dr. Carola Frost will consult tomorrow morning for management planning  Patient and family at bedside updated on plan.  Agreeable.  Patient takes Xarelto every other day for atrial  fibrillation.  She reports she has not been in atrial fibrillation for very long time.  Consult: [facility] hospitalist for admission.        Final Clinical Impression(s) / ED Diagnoses Final diagnoses:  Multiple closed fractures of pelvis with unstable disruption of pelvic ring, sequela  Intractable pain    Rx / DC Orders ED Discharge Orders     None         Arby Barrette, MD 07/17/22 2225

## 2022-07-17 NOTE — Assessment & Plan Note (Signed)
Chronic Stable 

## 2022-07-17 NOTE — Assessment & Plan Note (Addendum)
Chronic Following with cardiology On Xarelto 15 mg daily, atenolol and flecainide

## 2022-07-18 DIAGNOSIS — S32511A Fracture of superior rim of right pubis, initial encounter for closed fracture: Secondary | ICD-10-CM

## 2022-07-18 DIAGNOSIS — S329XXS Fracture of unspecified parts of lumbosacral spine and pelvis, sequela: Secondary | ICD-10-CM | POA: Diagnosis not present

## 2022-07-18 DIAGNOSIS — S32110A Nondisplaced Zone I fracture of sacrum, initial encounter for closed fracture: Secondary | ICD-10-CM

## 2022-07-18 DIAGNOSIS — W010XXA Fall on same level from slipping, tripping and stumbling without subsequent striking against object, initial encounter: Secondary | ICD-10-CM

## 2022-07-18 DIAGNOSIS — E43 Unspecified severe protein-calorie malnutrition: Secondary | ICD-10-CM | POA: Insufficient documentation

## 2022-07-18 LAB — COMPREHENSIVE METABOLIC PANEL
ALT: 13 U/L (ref 0–44)
AST: 17 U/L (ref 15–41)
Albumin: 3.6 g/dL (ref 3.5–5.0)
Alkaline Phosphatase: 183 U/L — ABNORMAL HIGH (ref 38–126)
Anion gap: 9 (ref 5–15)
BUN: 20 mg/dL (ref 8–23)
CO2: 21 mmol/L — ABNORMAL LOW (ref 22–32)
Calcium: 9.1 mg/dL (ref 8.9–10.3)
Chloride: 105 mmol/L (ref 98–111)
Creatinine, Ser: 1.12 mg/dL — ABNORMAL HIGH (ref 0.44–1.00)
GFR, Estimated: 48 mL/min — ABNORMAL LOW (ref >60)
Glucose, Bld: 102 mg/dL — ABNORMAL HIGH (ref 70–99)
Potassium: 3.8 mmol/L (ref 3.5–5.1)
Sodium: 135 mmol/L (ref 135–145)
Total Bilirubin: 0.8 mg/dL (ref 0.3–1.2)
Total Protein: 6.5 g/dL (ref 6.5–8.1)

## 2022-07-18 LAB — CBC WITH DIFFERENTIAL/PLATELET
Abs Immature Granulocytes: 0.05 10*3/uL (ref 0.00–0.07)
Basophils Absolute: 0.1 10*3/uL (ref 0.0–0.1)
Basophils Relative: 1 %
Eosinophils Absolute: 0 10*3/uL (ref 0.0–0.5)
Eosinophils Relative: 0 %
HCT: 33.5 % — ABNORMAL LOW (ref 36.0–46.0)
Hemoglobin: 10.7 g/dL — ABNORMAL LOW (ref 12.0–15.0)
Immature Granulocytes: 1 %
Lymphocytes Relative: 11 %
Lymphs Abs: 0.8 10*3/uL (ref 0.7–4.0)
MCH: 32.2 pg (ref 26.0–34.0)
MCHC: 31.9 g/dL (ref 30.0–36.0)
MCV: 100.9 fL — ABNORMAL HIGH (ref 80.0–100.0)
Monocytes Absolute: 0.8 10*3/uL (ref 0.1–1.0)
Monocytes Relative: 11 %
Neutro Abs: 5.1 10*3/uL (ref 1.7–7.7)
Neutrophils Relative %: 76 %
Platelets: 341 10*3/uL (ref 150–400)
RBC: 3.32 MIL/uL — ABNORMAL LOW (ref 3.87–5.11)
RDW: 14.2 % (ref 11.5–15.5)
WBC: 6.7 10*3/uL (ref 4.0–10.5)
nRBC: 0 % (ref 0.0–0.2)

## 2022-07-18 LAB — PROTIME-INR
INR: 1 (ref 0.8–1.2)
Prothrombin Time: 13.7 seconds (ref 11.4–15.2)

## 2022-07-18 MED ORDER — ENSURE ENLIVE PO LIQD
237.0000 mL | Freq: Two times a day (BID) | ORAL | Status: DC
  Administered 2022-07-18 – 2022-07-22 (×3): 237 mL via ORAL
  Filled 2022-07-18: qty 237

## 2022-07-18 MED ORDER — BOOST PLUS PO LIQD
237.0000 mL | Freq: Two times a day (BID) | ORAL | Status: DC
  Administered 2022-07-22: 237 mL via ORAL
  Filled 2022-07-18 (×9): qty 237

## 2022-07-18 MED ORDER — ADULT MULTIVITAMIN W/MINERALS CH
1.0000 | ORAL_TABLET | Freq: Every day | ORAL | Status: DC
  Administered 2022-07-18 – 2022-07-22 (×5): 1 via ORAL
  Filled 2022-07-18 (×5): qty 1

## 2022-07-18 NOTE — Consult Note (Signed)
Reason for Consult:Pelvic fxs Referring Physician: Poonamkumari Patel Time called: 0746 Time at bedside: 0909   Amy [name] is an 87 y.o. female.  HPI: [place] about a month [place]. She was brought to the ED and dx with right sacral and pubic rami fxs. She went to SNF for rehab and discharged home. She was doing ok ambulating with a RW but about a week ago began to have left hip that worsened to the point that she couldn't ambulate. She denies any other trauma. She returned to the ED and was admitted and orthopedic surgery was consulted. She lives at home with her husband.  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   Right Malar Cheek   Renal artery stenosis (HCC)     Past Surgical History:  Procedure Laterality Date   A FLUTTER ABLATION     KNEE ARTHROSCOPY Right    PERIPHERAL VASCULAR INTERVENTION Right 02/13/2021   Procedure: PERIPHERAL VASCULAR INTERVENTION;  Surgeon: Brabham, Vance W, MD;  Location: [facility] INVASIVE CV LAB;  Service: Cardiovascular;  Laterality: Right;   RENAL ANGIOGRAPHY N/A 02/13/2021   Procedure: RENAL ANGIOGRAPHY;  Surgeon: Brabham, Vance W, MD;  Location: [facility] INVASIVE CV LAB;  Service: Cardiovascular;  Laterality: N/A;   TUBAL LIGATION      Family History  Problem Relation Age of Onset   Stroke Father 75       died from stroke at age 75   Stroke Mother 73       died from stroke at age 73   Heart disease Mother    Colon cancer Brother     Social History:  reports that she has never smoked. She has never been exposed to tobacco smoke. She has never used smokeless tobacco. She reports that she does not drink alcohol and does not use drugs.  Allergies: No Known Allergies  Medications: I have reviewed the  patient's current medications.  Results for orders placed or performed during the hospital encounter of 07/17/22 (from the past 48 hour(s))  Basic metabolic panel     Status: Abnormal   Collection Time: 07/17/22 10:35 PM  Result Value Ref Range   Sodium 135 135 - 145 mmol/L   Potassium 3.8 3.5 - 5.1 mmol/L   Chloride 101 98 - 111 mmol/L   CO2 21 (L) 22 - 32 mmol/L   Glucose, Bld 105 (H) 70 - 99 mg/dL    Comment: Glucose reference range applies only to samples taken after fasting for at least 8 hours.   BUN 24 (H) 8 - 23 mg/dL   Creatinine, Ser 1.42 (H) 0.44 - 1.00 mg/dL   Calcium 9.6 8.9 - 10.3 mg/dL   GFR, Estimated 36 (L) >60 mL/min    Comment: (NOTE) Calculated using the CKD-EPI Creatinine Equation (2021)    Anion gap 13 5 - 15    Comment: Performed at [facility], [address] [place], [place] [address]  CBC with Differential     Status: Abnormal   Collection Time: 07/17/22 10:35 PM  Result Value Ref Range   WBC 8.1 4.0 - 10.5 K/uL   RBC 3.30 (L) 3.87 - 5.11 MIL/uL     Hemoglobin 10.7 (L) 12.0 - 15.0 g/dL   HCT 33.5 (L) 36.0 - 46.0 %   MCV 101.5 (H) 80.0 - 100.0 fL   MCH 32.4 26.0 - 34.0 pg   MCHC 31.9 30.0 - 36.0 g/dL   RDW 14.0 11.5 - 15.5 %   Platelets 358 150 - 400 K/uL   nRBC 0.0 0.0 - 0.2 %   Neutrophils Relative % 70 %   Neutro Abs 5.8 1.7 - 7.7 K/uL   Lymphocytes Relative 14 %   Lymphs Abs 1.1 0.7 - 4.0 K/uL   Monocytes Relative 14 %   Monocytes Absolute 1.1 (H) 0.1 - 1.0 K/uL   Eosinophils Relative 0 %   Eosinophils Absolute 0.0 0.0 - 0.5 K/uL   Basophils Relative 1 %   Basophils Absolute 0.1 0.0 - 0.1 K/uL   Immature Granulocytes 1 %   Abs Immature Granulocytes 0.06 0.00 - 0.07 K/uL    Comment: Performed at [facility], [address] [place], [place] [address]    CT PELVIS WO CONTRAST  Result Date: 07/17/2022 CLINICAL DATA:  Pelvic fracture, progressive pelvic pain EXAM: CT PELVIS WITHOUT CONTRAST TECHNIQUE: Multidetector CT imaging of  the pelvis was performed following the standard protocol without intravenous contrast. RADIATION DOSE REDUCTION: This exam was performed according to the departmental dose-optimization program which includes automated exposure control, adjustment of the mA and/or kV according to patient size and/or use of iterative reconstruction technique. COMPARISON:  06/16/2022 FINDINGS: Urinary Tract:  No abnormality visualized. Bowel: Extensive sigmoid diverticulosis without superimposed acute inflammatory change. Moderate stool within the rectal vault. Visualized large and small bowel are otherwise unremarkable. Appendix normal. No free intraperitoneal fluid. Vascular/Lymphatic: Mild atherosclerotic calcification within the aortoiliac vasculature. No aneurysm. No pathologic adenopathy within the abdomen and pelvis. Reproductive:  Pelvic organs are unremarkable. Other:  No abdominal wall hernia. Musculoskeletal: The osseous structures are diffusely osteopenic. Since the prior examination, there has developed resorption along the vertical fracture plane of the right sacral ala as well as report or shin and increasing fragmentation involving the right superior pubic ramus fracture with increasing displacement of the fracture fragments suggesting motion at this level. There is no significant bridging callus identified at either location. Additionally, there has developed a mildly displaced of the right iliac spine as well as a segmental fracture of the right inferior pubic ramus with mild anteroinferior displacement. A minimally displaced sagittally oriented fracture of the left sacral ala has developed in the interval. No dislocation. There is progressive soft tissue swelling surrounding the right superior pubic ramus fracture which may relate to local edema or hemorrhage. IMPRESSION: 1. Interval development of resorption along the vertical fracture plane of the right sacral ala as well as increasing fragmentation involving the  right superior pubic ramus fracture with increasing displacement of the fracture fragments suggesting motion at this level. No significant bridging callus identified at either location. Progressive soft tissue swelling surrounding the superior pubic ramus fracture. 2. Interval development of a mildly displaced of the right iliac spine as well as a segmental fracture of the right inferior pubic ramus with mild anteroinferior displacement. This was not present on prior examination and suggests recurrent injury. 3. Interval development of a minimally displaced sagittally oriented fracture of the left sacral ala. 4. Extensive sigmoid diverticulosis without superimposed acute inflammatory change. 5. Aortic atherosclerosis. Aortic Atherosclerosis (ICD10-I70.0). Electronically Signed   By: Ashesh  Parikh M.D.   On: 07/17/2022 21:09   DG Sacrum/Coccyx  Result Date: 07/17/2022 CLINICAL   DATA:  Right superior pubic ramus fracture, left buttock pain EXAM: SACRUM AND COCCYX - 2+ VIEW COMPARISON:  06/16/2022, 06/19/2022 FINDINGS: Pelvic inlet, pelvic outlet, and lateral views of the sacrum and coccyx are obtained. The right superior ramus fracture seen on prior CT is now minimally displaced, over previously it was nondisplaced in anatomic alignment. No significant callus formation. The right sacral ala fracture seen on prior CT is not well visualized by x-ray, with the sacrum obscured by overlying bowel gas and stool. Sacroiliac joints are unremarkable. Stable degenerative changes in the lower lumbar spine. IMPRESSION: 1. Minimal displacement of the right superior pubic ramus fracture seen previously. No significant callus formation. 2. The right sacral ala fracture on prior exam is radiographically occult. Electronically Signed   By: Camreigh Michie  Brown M.D.   On: 07/17/2022 14:51   DG HIPS BILAT WITH PELVIS MIN 5 VIEWS  Result Date: 07/17/2022 CLINICAL DATA:  Recent nondisplaced fracture of the right superior pubic ramus.  EXAM: DG HIP (WITH OR WITHOUT PELVIS) 5+V BILAT COMPARISON:  CT of the pelvis Jun 16, 2022 FINDINGS: There is severely displaced fracture of the right superior inferior pubic ramus. Fracture line extends to the right acetabulum. Moderate stool burden. IMPRESSION: Severely displaced fracture of the right superior and inferior pubic ramus, with extension to the right acetabulum. Electronically Signed   By: Dobrinka  Dimitrova M.D.   On: 07/17/2022 14:50    Review of Systems  HENT:  Negative for ear discharge, ear pain, hearing loss and tinnitus.   Eyes:  Negative for photophobia and pain.  Respiratory:  Negative for cough and shortness of breath.   Cardiovascular:  Negative for chest pain.  Gastrointestinal:  Negative for abdominal pain, nausea and vomiting.  Genitourinary:  Negative for dysuria, flank pain, frequency and urgency.  Musculoskeletal:  Positive for arthralgias (Pelvis/left hip). Negative for back pain, myalgias and neck pain.  Neurological:  Negative for dizziness and headaches.  Hematological:  Does not bruise/bleed easily.  Psychiatric/Behavioral:  The patient is not nervous/anxious.    Blood pressure 115/69, pulse 82, temperature 98.2 F (36.8 C), resp. rate 17, height 5' 7" (1.702 m), weight 52.4 kg, SpO2 97 %. Physical Exam Constitutional:      General: She is not in acute distress.    Appearance: She is well-developed. She is not diaphoretic.  HENT:     Head: Normocephalic and atraumatic.  Eyes:     General: No scleral icterus.       Right eye: No discharge.        Left eye: No discharge.     Conjunctiva/sclera: Conjunctivae normal.  Cardiovascular:     Rate and Rhythm: Normal rate and regular rhythm.  Pulmonary:     Effort: Pulmonary effort is normal. No respiratory distress.  Musculoskeletal:     Cervical back: Normal range of motion.     Comments: Pelvis--no traumatic wounds or rash, no ecchymosis, stable to manual stress, left hip pain with lateral compression,  right groin pain with AP compression, point TTP right sacrum   Skin:    General: Skin is warm and dry.  Neurological:     Mental Status: She is alert.  Psychiatric:        Mood and Affect: Mood normal.        Behavior: Behavior normal.     Assessment/Plan: Pelvic fxs -- Given alternatives would be reasonable to stabilize pelvis and hope that her left hip pain is referred or stems from accomodation of her other   pelvic fxs. Plan on screw fixation tomorrow with Dr. Haddix. Please keep NPO after MN.    La Shehan J. Macall Mccroskey, PA-C Orthopedic Surgery [phone] 07/18/2022, 9:37 AM  

## 2022-07-18 NOTE — Evaluation (Signed)
Physical Therapy Evaluation Patient Details Name: Amy [name] MRN: [id number] DOB: 1935/07/22 Today's Date: 07/18/2022  History of Present Illness  87 year old female admitted 6/26 with hip pain, found worsening of her pelvis ramus fracture plus new fractures.  Previously, 06/16/2022 had fall with pelvic fx. Past medical history of HTN, atrial fibrillation/flutter (sp ablation, maintained on Tambocor and Xarelto), hypothyroidism, T2DM  w/ nephropathy, and CKD IIIb.  Clinical Impression  Pt admitted with above diagnosis. Mobilizing well after d/c from SNF however just PTA pt pain limiting ability to ambulate. Today pt transfers and ambulates at a min guard level without physical assistance. Pain adequately controlled during evaluation, able to have BM and void on BSC. Will follow and progress during admission. Looks like plan for surgery. Hopefully regains level of function quickly post-op and can return home but will update recs as appropriate. Pt currently with functional limitations due to the deficits listed below (see PT Problem List). Pt will benefit from acute skilled PT to increase their independence and safety with mobility to allow discharge.          Recommendations for follow up therapy are one component of a multi-disciplinary discharge planning process, led by the attending physician.  Recommendations may be updated based on patient status, additional functional criteria and insurance authorization.     Assistance Recommended at Discharge Intermittent Supervision/Assistance  Patient can return home with the following  A little help with walking and/or transfers;A little help with bathing/dressing/bathroom;Assistance with cooking/housework;Assist for transportation    Equipment Recommendations None recommended by PT     Functional Status Assessment Patient has had a recent decline in their functional status and demonstrates the ability to make significant improvements in function in  a reasonable and predictable amount of time.     Precautions / Restrictions Precautions Precautions: Fall      Mobility  Bed Mobility Overal bed mobility: Modified Independent             General bed mobility comments: extra time    Transfers Overall transfer level: Needs assistance Equipment used: Rolling walker (2 wheels) Transfers: Sit to/from Stand, Bed to chair/wheelchair/BSC Sit to Stand: Min guard   Step pivot transfers: Min guard       General transfer comment: Min guard for sit<>stand transitions from bed and BSC. Cues for hand placement. Min guard for step pivot transfer with good RW control.    Ambulation/Gait Ambulation/Gait assistance: Min guard Gait Distance (Feet): 20 Feet Assistive device: Rolling walker (2 wheels) Gait Pattern/deviations: Step-through pattern, Decreased stride length, Antalgic Gait velocity: decr Gait velocity interpretation: <1.31 ft/sec, indicative of household ambulator   General Gait Details: Slow, slightly guarded without overt LOB or buckling noted. Light use of RW for support.  Stairs            Wheelchair Mobility    Modified Rankin (Stroke Patients Only)       Balance Overall balance assessment: Needs assistance Sitting-balance support: No upper extremity supported, Feet supported Sitting balance-Leahy Scale: Good   Postural control: Right lateral lean Standing balance support: No upper extremity supported Standing balance-Leahy Scale: Fair                               Pertinent Vitals/Pain Pain Assessment Pain Assessment: Faces Faces Pain Scale: Hurts little more Pain Location: Lt hip Pain Descriptors / Indicators: Aching Pain Intervention(s): Monitored during session, Repositioned    Home Living Family/patient expects to  be discharged to:: Private residence Living Arrangements: Spouse/significant other Available Help at Discharge: Family;Available 24 hours/day Type of Home:  House Home Access: Stairs to enter Entrance Stairs-Rails: None Entrance Stairs-Number of Steps: 1 Alternate Level Stairs-Number of Steps: Patient does not go to basement Home Layout: Two level;Able to live on main level with bedroom/bathroom;Full bath on main level Home Equipment: Rolling Walker (2 wheels);Cane - single point;BSC/3in1;Shower seat;Toilet riser      Prior Function Prior Level of Function : Independent/Modified Independent             Mobility Comments: Progressed back to household mobility following recent d/c from SNF ADLs Comments: Prior falls reported.     Hand Dominance   Dominant Hand: Right    Extremity/Trunk Assessment   Upper Extremity Assessment Upper Extremity Assessment: Defer to OT evaluation    Lower Extremity Assessment Lower Extremity Assessment: Generalized weakness       Communication   Communication: No difficulties  Cognition Arousal/Alertness: Awake/alert Behavior During Therapy: WFL for tasks assessed/performed Overall Cognitive Status: Within Functional Limits for tasks assessed                                          General Comments      Exercises     Assessment/Plan    PT Assessment Patient needs continued PT services  PT Problem List Decreased strength;Decreased range of motion;Decreased balance;Decreased activity tolerance;Decreased mobility;Decreased knowledge of use of DME;Pain       PT Treatment Interventions DME instruction;Gait training;Stair training;Functional mobility training;Therapeutic activities;Therapeutic exercise;Balance training;Neuromuscular re-education;Patient/family education;Modalities    PT Goals (Current goals can be found in the Care Plan section)  Acute Rehab PT Goals Patient Stated Goal: Get well, reduce pain PT Goal Formulation: With patient Time For Goal Achievement: 08/01/22 Potential to Achieve Goals: Good    Frequency Min 3X/week     Co-evaluation                AM-PAC PT "6 Clicks" Mobility  Outcome Measure Help needed turning from your back to your side while in a flat bed without using bedrails?: None Help needed moving from lying on your back to sitting on the side of a flat bed without using bedrails?: None Help needed moving to and from a bed to a chair (including a wheelchair)?: A Little Help needed standing up from a chair using your arms (e.g., wheelchair or bedside chair)?: A Little Help needed to walk in hospital room?: A Little Help needed climbing 3-5 steps with a railing? : A Lot 6 Click Score: 19    End of Session Equipment Utilized During Treatment: Gait belt Activity Tolerance: Patient tolerated treatment well Patient left: in chair;with call bell/phone within reach;with chair alarm set;with family/visitor present Nurse Communication: Mobility status PT Visit Diagnosis: Other abnormalities of gait and mobility (R26.89);Muscle weakness (generalized) (M62.81);History of falling (Z91.81);Pain Pain - Right/Left: Left Pain - part of body: Hip    Time: 8413-2440 PT Time Calculation (min) (ACUTE ONLY): 18 min   Charges:   PT Evaluation $PT Eval Low Complexity: 1 Low          Kathlyn Sacramento, PT, DPT [facility]  Rehabilitation Services Physical Therapist Office: [phone] Website: [email or web address]   Berton Mount 07/18/2022, 5:12 PM

## 2022-07-18 NOTE — Progress Notes (Signed)
PT Cancellation Note  Patient Details Name: Amy [name] MRN: [id number] DOB: 1935-12-31   Cancelled Treatment:    Reason Eval/Treat Not Completed: Patient declined, no reason specified  Would like to get out of bed, but requests PT follow-up a little later this afternoon for evaluation.  Will check back later.  Kathlyn Sacramento, PT, DPT [facility]  Rehabilitation Services Physical Therapist Office: [phone] Website: [email or web address]  Berton Mount 07/18/2022, 12:03 PM

## 2022-07-18 NOTE — Progress Notes (Signed)
Initial Nutrition Assessment  DOCUMENTATION CODES:   Severe malnutrition in context of chronic illness, Underweight  INTERVENTION:  Liberalize diet to regular Boost Plus po BID, each supplement provides 360 kcal and 14 grams of protein Magic cup TID with meals, each supplement provides 290 kcal and 9 grams of protein MVI with minerals daily "High Calorie, High Protein Nutrition Therapy" handout added to AVS  NUTRITION DIAGNOSIS:   Severe Malnutrition related to chronic illness (advanced age, CKD IIIb) as evidenced by severe fat depletion, severe muscle depletion, moderate muscle depletion.  GOAL:   Patient will meet greater than or equal to 90% of their needs  MONITOR:   PO intake, Supplement acceptance, Labs, Weight trends, Skin  REASON FOR ASSESSMENT:   Malnutrition Screening Tool    ASSESSMENT:   Pt admitted with progressive L sided hip pain following a fall on 5/26 leading to pelvic bone fracture. PMH significant for HTN, afib, hypothyroidism, T2DM with nephropathy, CKD IIIb.  Plans for screw fixation of pelvis tomorrow.    Spoke with pt at bedside. Nutrition related history obtained with assistance of her daughter at bedside who is a Human resources officer.   Pt has had a poor appetite for several months. She endorses having taste changes and does not enjoy foods the way she used to. Up until her fall, she had been independent with daily activities. Her dietary recall includes a breakfast bar in the morning as she is not a breakfast eater, though recently has been consuming a high protein Boost supplement for breakfast. Her daughter was visiting (06/15-06/19) following discharge from [facility] and was ensuring she received lunch and dinner.   Pt's daughter denies any signs of dysphagia or swallowing difficulties.  No documented meal completions on file to review at this time.   They recall gradual weight loss over time. Within the last 5 months, pt's weight has  fluctuated up and down however since 05/30 her weight has consistently declined. Weight loss noted of 10% since that time which is clinically significant for time frame.   Medications: colace, melatonin, IV NaCl @ 62ml/hr  Labs: BUN 24, Cr 1.42, GFR 36  NUTRITION - FOCUSED PHYSICAL EXAM:  Flowsheet Row Most Recent Value  Orbital Region Severe depletion  Upper Arm Region Severe depletion  Thoracic and Lumbar Region Severe depletion  Buccal Region Severe depletion  Temple Region Moderate depletion  Clavicle Bone Region Severe depletion  Clavicle and Acromion Bone Region Severe depletion  Scapular Bone Region Severe depletion  Dorsal Hand Severe depletion  Patellar Region Moderate depletion  Anterior Thigh Region Moderate depletion  Posterior Calf Region Moderate depletion  Edema (RD Assessment) None  Hair Reviewed  Eyes Reviewed  Mouth Reviewed  Skin Reviewed  Nails Reviewed       Diet Order:   Diet Order             Diet NPO time specified Except for: Sips with Meds  Diet effective midnight           Diet regular Room service appropriate? Yes; Fluid consistency: Thin  Diet effective now                   EDUCATION NEEDS:   Education needs have been addressed  Skin:  Skin Assessment: Reviewed RN Assessment  Last BM:  6/25  Height:   Ht Readings from Last 1 Encounters:  07/18/22 5\' 7"  (1.702 m)    Weight:   Wt Readings from Last 1 Encounters:  07/18/22  52.4 kg   BMI:  Body mass index is 18.09 kg/m.  Estimated Nutritional Needs:   Kcal:  1500-1700  Protein:  75-90g  Fluid:  >/=1.5L  Drusilla Kanner, RDN, LDN Clinical Nutrition

## 2022-07-18 NOTE — Progress Notes (Signed)
PROGRESS NOTE   Amy [name]  AVW:[id number] DOB: [birth year] DOA: 07/17/2022 PCP: Pincus Sanes, MD   Date of Service: the patient was seen and examined on 07/18/2022  Brief Narrative:  This is a 87 year old female with past medical history of HTN, atrial fibrillation/flutter (sp ablation, maintained on Tambocor and Xarelto), hypothyroidism, T2DM  w/ nephropathy, and CKD IIIb.  Her last dose of Xarelto was 06/14/2022.  On Sunday 06/16/2022 patient tripped on a carpet while leaving church.  The, fall resulted in pelvic bone fracture.  CT readings Nondisplaced fracture of the right superior pubic ramus. Nondisplaced fracture of the right sacral ala. Patient was recently discharged to [facility] rehab where she spent the last month.  Patient discharged home on 6/22.  At the time of discharge patient's pain was minimal.  Since being home developed in her left hip and has progressively worsened.  Imaging done on the ER imaging shows worsening of her pelvis ramus fracture plus new fractures.  Assessment and Plan:   Pelvic fractures -Orthopedics following-surgery scheduled for tomorrow with Dr Jena Gauss -Pain control: tylenol, dilaudid -N.p.o. at midnight -Hold Xarelto, last taken 03/16/22.  -PT/OT -Heparin for DVT ppx   Hypothyroidism -Continue Synthroid   Atrial fibrillation  -Hold Xarelto in setting of surgery -Continue flecainide   Essential hypertension -Continue hydralazine, atenolol, clonidine, Norvasc, and losartan   Stage 3a CKD  Cr 1.12, stable at baseline -Continue to monitor with BMP  Subjective:  No acute concerns. Answered daughters questions.   Physical Exam:  Vitals:   07/18/22 0015 07/18/22 0132 07/18/22 0537 07/18/22 0730  BP: (!) 144/57  (!) 105/49 115/69  Pulse: 73  79 82  Resp:   16 17  Temp: 98.1 F (36.7 C)  (!) 97.5 F (36.4 C) 98.2 F (36.8 C)  TempSrc: Oral  Oral   SpO2: 98%  97% 97%  Weight:  52.4 kg    Height:  5\' 7"  (1.702 m)     General: Alert,  no acute distress, frail appearing  Cardio: Normal S1 and S2, RRR, no r/m/g Pulm: CTAB, normal work of breathing Abdomen: Bowel sounds normal. Abdomen soft and non-tender.  Extremities: No peripheral edema.  Neuro: Cranial nerves grossly intact   I have personally reviewed and interpreted labs, imaging.   CBC: Recent Labs  Lab 07/17/22 2235  WBC 8.1  NEUTROABS 5.8  HGB 10.7*  HCT 33.5*  MCV 101.5*  PLT 358   Basic Metabolic Panel: Recent Labs  Lab 07/17/22 2235  NA 135  K 3.8  CL 101  CO2 21*  GLUCOSE 105*  BUN 24*  CREATININE 1.42*  CALCIUM 9.6   GFR: Estimated Creatinine Clearance: 24.4 mL/min (A) (by C-G formula based on SCr of 1.42 mg/dL (H)). Liver Function Tests: No results for input(s): "AST", "ALT", "ALKPHOS", "BILITOT", "PROT", "ALBUMIN" in the last 168 hours.  Coagulation Profile: No results for input(s): "INR", "PROTIME" in the last 168 hours.  Code Status:  DNR.  Code status decision has been confirmed with: patient Family Communication: daughter updated at bedside     Severity of Illness:  The appropriate patient status for this patient is INPATIENT. Inpatient status is judged to be reasonable and necessary in order to provide the required intensity of service to ensure the patient's safety. The patient's presenting symptoms, physical exam findings, and initial radiographic and laboratory data in the context of their chronic comorbidities is felt to place them at high risk for further clinical deterioration. Furthermore, it is not  anticipated that the patient will be medically stable for discharge from the hospital within 2 midnights of admission.   * I certify that at the point of admission it is my clinical judgment that the patient will require inpatient hospital care spanning beyond 2 midnights from the point of admission due to high intensity of service, high risk for further deterioration and high frequency of surveillance  required.*  Author:  Rolm Gala MD  07/18/2022 9:03 AM

## 2022-07-18 NOTE — H&P (View-Only) (Signed)
Reason for Consult:Pelvic fxs Referring Physician: Wayne Both Time called: 1914 Time at bedside: 0909   Amy [name] is an 87 y.o. female.  HPI: [place] fell at church about a month ago. She was brought to the ED and dx with right sacral and pubic rami fxs. She went to SNF for rehab and discharged home. She was doing ok ambulating with a RW but about a week ago began to have left hip that worsened to the point that she couldn't ambulate. She denies any other trauma. She returned to the ED and was admitted and orthopedic surgery was consulted. She lives at home with her husband.  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   Right Malar Cheek   Renal artery stenosis [facility])     Past Surgical History:  Procedure Laterality Date   A FLUTTER ABLATION     KNEE ARTHROSCOPY Right    PERIPHERAL VASCULAR INTERVENTION Right 02/13/2021   Procedure: PERIPHERAL VASCULAR INTERVENTION;  Surgeon: Nada Libman, MD;  Location: [facility] INVASIVE CV LAB;  Service: Cardiovascular;  Laterality: Right;   RENAL ANGIOGRAPHY N/A 02/13/2021   Procedure: RENAL ANGIOGRAPHY;  Surgeon: Nada Libman, MD;  Location: [facility] INVASIVE CV LAB;  Service: Cardiovascular;  Laterality: N/A;   TUBAL LIGATION      Family History  Problem Relation Age of Onset   Stroke Father 58       died from stroke at age 46   Stroke Mother 48       died from stroke at age 71   Heart disease Mother    Colon cancer Brother     Social History:  reports that she has never smoked. She has never been exposed to tobacco smoke. She has never used smokeless tobacco. She reports that she does not drink alcohol and does not use drugs.  Allergies: No Known Allergies  Medications: I have reviewed the  patient's current medications.  Results for orders placed or performed during the hospital encounter of 07/17/22 (from the past 48 hour(s))  Basic metabolic panel     Status: Abnormal   Collection Time: 07/17/22 10:35 PM  Result Value Ref Range   Sodium 135 135 - 145 mmol/L   Potassium 3.8 3.5 - 5.1 mmol/L   Chloride 101 98 - 111 mmol/L   CO2 21 (L) 22 - 32 mmol/L   Glucose, Bld 105 (H) 70 - 99 mg/dL    Comment: Glucose reference range applies only to samples taken after fasting for at least 8 hours.   BUN 24 (H) 8 - 23 mg/dL   Creatinine, Ser 7.82 (H) 0.44 - 1.00 mg/dL   Calcium 9.6 8.9 - 95.6 mg/dL   GFR, Estimated 36 (L) >60 mL/min    Comment: (NOTE) Calculated using the CKD-EPI Creatinine Equation (2021)    Anion gap 13 5 - 15    Comment: Performed at [facility], [address], [place], [place] [address]  CBC with Differential     Status: Abnormal   Collection Time: 07/17/22 10:35 PM  Result Value Ref Range   WBC 8.1 4.0 - 10.5 K/uL   RBC 3.30 (L) 3.87 - 5.11 MIL/uL  Hemoglobin 10.7 (L) 12.0 - 15.0 g/dL   HCT 16.1 (L) 09.6 - 04.5 %   MCV 101.5 (H) 80.0 - 100.0 fL   MCH 32.4 26.0 - 34.0 pg   MCHC 31.9 30.0 - 36.0 g/dL   RDW 40.9 81.1 - 91.4 %   Platelets 358 150 - 400 K/uL   nRBC 0.0 0.0 - 0.2 %   Neutrophils Relative % 70 %   Neutro Abs 5.8 1.7 - 7.7 K/uL   Lymphocytes Relative 14 %   Lymphs Abs 1.1 0.7 - 4.0 K/uL   Monocytes Relative 14 %   Monocytes Absolute 1.1 (H) 0.1 - 1.0 K/uL   Eosinophils Relative 0 %   Eosinophils Absolute 0.0 0.0 - 0.5 K/uL   Basophils Relative 1 %   Basophils Absolute 0.1 0.0 - 0.1 K/uL   Immature Granulocytes 1 %   Abs Immature Granulocytes 0.06 0.00 - 0.07 K/uL    Comment: Performed at [facility], [address] [place], [place] [address]    CT PELVIS WO CONTRAST  Result Date: 07/17/2022 CLINICAL DATA:  Pelvic fracture, progressive pelvic pain EXAM: CT PELVIS WITHOUT CONTRAST TECHNIQUE: Multidetector CT imaging of  the pelvis was performed following the standard protocol without intravenous contrast. RADIATION DOSE REDUCTION: This exam was performed according to the departmental dose-optimization program which includes automated exposure control, adjustment of the mA and/or kV according to patient size and/or use of iterative reconstruction technique. COMPARISON:  06/16/2022 FINDINGS: Urinary Tract:  No abnormality visualized. Bowel: Extensive sigmoid diverticulosis without superimposed acute inflammatory change. Moderate stool within the rectal vault. Visualized large and small bowel are otherwise unremarkable. Appendix normal. No free intraperitoneal fluid. Vascular/Lymphatic: Mild atherosclerotic calcification within the aortoiliac vasculature. No aneurysm. No pathologic adenopathy within the abdomen and pelvis. Reproductive:  Pelvic organs are unremarkable. Other:  No abdominal wall hernia. Musculoskeletal: The osseous structures are diffusely osteopenic. Since the prior examination, there has developed resorption along the vertical fracture plane of the right sacral ala as well as report or shin and increasing fragmentation involving the right superior pubic ramus fracture with increasing displacement of the fracture fragments suggesting motion at this level. There is no significant bridging callus identified at either location. Additionally, there has developed a mildly displaced of the right iliac spine as well as a segmental fracture of the right inferior pubic ramus with mild anteroinferior displacement. A minimally displaced sagittally oriented fracture of the left sacral ala has developed in the interval. No dislocation. There is progressive soft tissue swelling surrounding the right superior pubic ramus fracture which may relate to local edema or hemorrhage. IMPRESSION: 1. Interval development of resorption along the vertical fracture plane of the right sacral ala as well as increasing fragmentation involving the  right superior pubic ramus fracture with increasing displacement of the fracture fragments suggesting motion at this level. No significant bridging callus identified at either location. Progressive soft tissue swelling surrounding the superior pubic ramus fracture. 2. Interval development of a mildly displaced of the right iliac spine as well as a segmental fracture of the right inferior pubic ramus with mild anteroinferior displacement. This was not present on prior examination and suggests recurrent injury. 3. Interval development of a minimally displaced sagittally oriented fracture of the left sacral ala. 4. Extensive sigmoid diverticulosis without superimposed acute inflammatory change. 5. Aortic atherosclerosis. Aortic Atherosclerosis (ICD10-I70.0). Electronically Signed   By: Helyn Numbers M.D.   On: 07/17/2022 21:09   DG Sacrum/Coccyx  Result Date: 07/17/2022 CLINICAL  DATA:  Right superior pubic ramus fracture, left buttock pain EXAM: SACRUM AND COCCYX - 2+ VIEW COMPARISON:  06/16/2022, 06/19/2022 FINDINGS: Pelvic inlet, pelvic outlet, and lateral views of the sacrum and coccyx are obtained. The right superior ramus fracture seen on prior CT is now minimally displaced, over previously it was nondisplaced in anatomic alignment. No significant callus formation. The right sacral ala fracture seen on prior CT is not well visualized by x-ray, with the sacrum obscured by overlying bowel gas and stool. Sacroiliac joints are unremarkable. Stable degenerative changes in the lower lumbar spine. IMPRESSION: 1. Minimal displacement of the right superior pubic ramus fracture seen previously. No significant callus formation. 2. The right sacral ala fracture on prior exam is radiographically occult. Electronically Signed   By: Sharlet Salina M.D.   On: 07/17/2022 14:51   DG HIPS BILAT WITH PELVIS MIN 5 VIEWS  Result Date: 07/17/2022 CLINICAL DATA:  Recent nondisplaced fracture of the right superior pubic ramus.  EXAM: DG HIP (WITH OR WITHOUT PELVIS) 5+V BILAT COMPARISON:  CT of the pelvis Jun 16, 2022 FINDINGS: There is severely displaced fracture of the right superior inferior pubic ramus. Fracture line extends to the right acetabulum. Moderate stool burden. IMPRESSION: Severely displaced fracture of the right superior and inferior pubic ramus, with extension to the right acetabulum. Electronically Signed   By: Ted Mcalpine M.D.   On: 07/17/2022 14:50    Review of Systems  HENT:  Negative for ear discharge, ear pain, hearing loss and tinnitus.   Eyes:  Negative for photophobia and pain.  Respiratory:  Negative for cough and shortness of breath.   Cardiovascular:  Negative for chest pain.  Gastrointestinal:  Negative for abdominal pain, nausea and vomiting.  Genitourinary:  Negative for dysuria, flank pain, frequency and urgency.  Musculoskeletal:  Positive for arthralgias (Pelvis/left hip). Negative for back pain, myalgias and neck pain.  Neurological:  Negative for dizziness and headaches.  Hematological:  Does not bruise/bleed easily.  Psychiatric/Behavioral:  The patient is not nervous/anxious.    Blood pressure 115/69, pulse 82, temperature 98.2 F (36.8 C), resp. rate 17, height 5\' 7"  (1.702 m), weight 52.4 kg, SpO2 97 %. Physical Exam Constitutional:      General: She is not in acute distress.    Appearance: She is well-developed. She is not diaphoretic.  HENT:     Head: Normocephalic and atraumatic.  Eyes:     General: No scleral icterus.       Right eye: No discharge.        Left eye: No discharge.     Conjunctiva/sclera: Conjunctivae normal.  Cardiovascular:     Rate and Rhythm: Normal rate and regular rhythm.  Pulmonary:     Effort: Pulmonary effort is normal. No respiratory distress.  Musculoskeletal:     Cervical back: Normal range of motion.     Comments: Pelvis--no traumatic wounds or rash, no ecchymosis, stable to manual stress, left hip pain with lateral compression,  right groin pain with AP compression, point TTP right sacrum   Skin:    General: Skin is warm and dry.  Neurological:     Mental Status: She is alert.  Psychiatric:        Mood and Affect: Mood normal.        Behavior: Behavior normal.     Assessment/Plan: Pelvic fxs -- Given alternatives would be reasonable to stabilize pelvis and hope that her left hip pain is referred or stems from accomodation of her other  pelvic fxs. Plan on screw fixation tomorrow with Dr. Jena Gauss. Please keep NPO after MN.    Freeman Caldron, PA-C Orthopedic Surgery [phone] 07/18/2022, 9:37 AM

## 2022-07-18 NOTE — Anesthesia Preprocedure Evaluation (Addendum)
Anesthesia Evaluation  Patient identified by MRN, date of birth, ID band Patient awake    Reviewed: Allergy & Precautions, NPO status , Patient's Chart, lab work & pertinent test results, reviewed documented beta blocker date and time   Airway Mallampati: II  TM Distance: >3 FB Neck ROM: Full    Dental no notable dental hx. (+) Dental Advisory Given   Pulmonary pneumonia   Pulmonary exam normal breath sounds clear to auscultation       Cardiovascular hypertension, Pt. on home beta blockers and Pt. on medications pulmonary hypertension+ Peripheral Vascular Disease  + dysrhythmias Atrial Fibrillation  Rhythm:Irregular Rate:Normal  ECHO 01/2022  1. Left ventricular ejection fraction, by estimation, is 65 to 70%. The left ventricle has normal function. The left ventricle has no regional wall motion abnormalities. Left ventricular diastolic parameters are consistent with Grade II diastolic dysfunction (pseudonormalization).   2. Right ventricular systolic function is normal. The right ventricular size is normal. There is moderately elevated pulmonary artery systolic pressure. The estimated right ventricular systolic pressure is 54.0 mmHg.   3. Left atrial size was moderately dilated.   4. A small pericardial effusion is present. The pericardial effusion is posterior to the left ventricle.   5. The mitral valve is degenerative. Mild mitral valve regurgitation.   6. Tricuspid valve regurgitation is mild to moderate.   7. The aortic valve is tricuspid. There is mild calcification of the aortic valve. Aortic valve regurgitation is trivial. Aortic valve sclerosis/calcification is present, without any evidence of aortic stenosis.   8. The inferior vena cava is normal in size with greater than 50% respiratory variability, suggesting right atrial pressure of 3 mmHg.   Comparison(s): No prior Echocardiogram.     Neuro/Psych  PSYCHIATRIC DISORDERS  Anxiety Depression    negative neurological ROS     GI/Hepatic negative GI ROS, Neg liver ROS,,,  Endo/Other  Hypothyroidism    Renal/GU Renal disease     Musculoskeletal  (+) Arthritis ,    Abdominal   Peds  Hematology  (+) Blood dyscrasia, anemia   Anesthesia Other Findings   Reproductive/Obstetrics                             Anesthesia Physical Anesthesia Plan  ASA: 4  Anesthesia Plan: General   Post-op Pain Management: Tylenol PO (pre-op)*   Induction: Intravenous  PONV Risk Score and Plan: 4 or greater and Ondansetron, Dexamethasone and Treatment may vary due to age or medical condition  Airway Management Planned: Oral ETT  Additional Equipment:   Intra-op Plan:   Post-operative Plan: Extubation in OR  Informed Consent: I have reviewed the patients History and Physical, chart, labs and discussed the procedure including the risks, benefits and alternatives for the proposed anesthesia with the patient or authorized representative who has indicated his/her understanding and acceptance.   Patient has DNR.  Discussed DNR with patient and Suspend DNR.   Dental advisory given  Plan Discussed with: CRNA  Anesthesia Plan Comments:        Anesthesia Quick Evaluation

## 2022-07-18 NOTE — Discharge Instructions (Addendum)
High-Calorie, High-Protein Nutrition Therapy (2021) A high-calorie, high-protein diet has been recommended to you. Your registered dietitian nutritionist (RDN) may have recommended this diet because you are having difficulty eating enough calories throughout the day, you have lost weight, and/or you need to add protein to your diet. Sometimes you may not feel like eating, even if you know the importance of good nutrition. The recommendations in this handout can help you with the following: Regaining your strength and energy Keeping your body healthy Healing and recovering from surgery or illness and fighting infection Tips: Schedule Your Meals and Snacks Several small meals and snacks are often better tolerated and digested than large meals. Strategies Plan to eat 3 meals and 3 snacks daily. Experiment with timing meals to find out when you have a larger appetite. Appetite may be greatest in the morning after not eating all night so you may prefer to eat your larger meals and snacks in the morning and at lunch. Breakfast-type foods are often better tolerated so eat foods such as eggs, pancakes, waffles and cereal for any meal or snack. Carry snacks with you so you are prepared to eat every 2 to 3 hours. Determine what works best for you if your body's cues for feeling hungry or full are not working. Eat a small meal or snack even if you don't feel hungry. Set a timer to remind you when it is time to eat. Take a walk before you eat (with health care provider's approval). Light or moderate physical activity can help you maintain muscle and increase your appetite. Make Eating Enjoyable Taking steps to make the experience enjoyable may help to increase your interest in eating and improve your appetite. Strategies: Eat with others whenever possible. Include your favorite foods to make meals more enjoyable. Try new foods. Save your beverage for the end of the meal so that you have more room for  food before you get full. Add Calories to Your Meals and Snacks Try adding calorie-dense foods so that each bite provides more nutrition. Strategies Drink milk, chocolate milk, soy milk, or smoothies instead of low-calorie beverages such as diet drinks or water. Cook with milk or soy milk instead of water when making dishes such as hot cereal, cocoa, or pudding. Add jelly, jam, honey, butter or margarine to bread and crackers. Add jam or fruit to ice cream and as a topping over cake. Mix dried fruit, nuts, granola, honey, or dry cereal with yogurt or hot cereals. Enjoy snacks such as milkshakes, smoothies, pudding, ice cream, or custard. Blend a fruit smoothie of a banana, frozen berries, milk or soy milk, and 1 tablespoon nonfat powdered milk or protein powder. Add Protein to Your Meals and Snacks Choose at least one protein food at each meal and snack to increase your daily intake. Strategies Add  cup nonfat dry milk powder or protein powder to make a high-protein milk to drink or to use in recipes that call for milk. Vanilla or peppermint extract or unsweetened cocoa powder could help to boost the flavor. Add hard-cooked eggs, leftover meat, grated cheese, canned beans or tofu to noodles, rice, salads, sandwiches, soups, casseroles, pasta, tuna and other mixed dishes. Add powdered milk or protein powder to hot cereals, meatloaf, casseroles, scrambled eggs, sauces, cream soups, and shakes. Add beans and lentils to salads, soups, casseroles, and vegetable dishes. Eat cottage cheese or yogurt, especially Greek yogurt, with fruit as a snack or dessert. Eat peanut or other nut butters on crackers, bread, toast,   waffles, apples, bananas or celery sticks. Add it to milkshakes, smoothies, or desserts. Consider a ready-made protein shake. Your RDN will make recommendations. Add Fats to Your Meals and Snacks Try adding fats to your meals and snacks. Fat provides more calories in fewer bites than  carbohydrate or protein and adds flavors to your foods. Strategies Snack on nuts and seeds or add them to foods like salads, pasta, cereals, yogurt, and ice cream.  Saut or stir-fry vegetables, meats, chicken, fish or tofu in olive or canola oil.  Add olive oil, other vegetable oils, butter or margarine to soups, vegetables, potatoes, cooked cereal, rice, pasta, bread, crackers, pancakes, or waffles. Snack on olives or add to pasta, pizza, or salad. Add avocado or guacamole to your salads, sandwiches, and other entrees. Include fatty fish such as salmon in your weekly meal plan. For general food safety tips, especially for clients with immunocompromised conditions, ask your RDN for the Food Safety Nutrition Therapy handout. Small Meal and Snack Ideas These snacks and meals are recommended when you have to eat but aren't necessarily hungry.  They are good choices because they are high in protein and high in calories.  2 graham crackers 2 tablespoons peanut or other nut butter 1 cup milk 2 slices whole wheat toast topped with:  avocado, mashed Seasoning of your choice   cup Greek yogurt  cup fruit  cup granola 2 deviled egg halves 5 whole wheat crackers  1 cup cream of tomato soup  grilled cheese sandwich 1 toasted waffle topped with: 2 tablespoons peanut or nut butter 1 tablespoon jam  Trail mix made with:  cup nuts  cup dried fruit  cup cold cereal, any variety  cup oatmeal or cream of wheat cereal 1 tablespoon peanut or nut butter  cup diced fruit   High-Calorie, High-Protein Sample 1-Day Menu View Nutrient Info Breakfast 1 egg, scrambled 1 ounce cheddar cheese 1 English muffin, whole wheat 1 tablespoon margarine 1 tablespoon jam  cup orange juice, fortified with calcium and vitamin D  Morning Snack 1 tablespoon peanut butter 1 banana 1 cup 1% milk  Lunch Tuna salad sandwich made with: 2 slices bread, whole wheat 3 ounces tuna mixed with: 1 tablespoon  mayonnaise  cup pudding  Afternoon Snack  cup hummus  cup carrots 1 pita  Evening Meal Enchilada casserole made with: 2 corn tortillas 3 ounces ground beef, cooked  cup black beans, cooked  cup corn, cooked 1 ounce grated cheddar cheese  cup enchilada sauce  avocado, sliced, topping for enchilada 1 tablespoon sour cream, topping for enchilada Salad:  cup lettuce, shredded  cup tomatoes, chopped, for salad 1 tablespoon olive oil and vinegar dressing, for salad  Evening Snack  cup Greek yogurt  cup blueberries  cup granola   Information on my medicine - XARELTO (Rivaroxaban)  This medication education was reviewed with me or my healthcare representative as part of my discharge preparation.  The pharmacist that spoke with me during my hospital stay was:    Why was Xarelto prescribed for you? Xarelto was prescribed for you to reduce the risk of a blood clot forming that can cause a stroke if you have a medical condition called atrial fibrillation (a type of irregular heartbeat).  What do you need to know about xarelto ? Take your Xarelto ONCE DAILY at the same time every day with your evening meal. If you have difficulty swallowing the tablet whole, you may crush it and mix in applesauce just prior to  taking your dose.  Take Xarelto exactly as prescribed by your doctor and DO NOT stop taking Xarelto without talking to the doctor who prescribed the medication.  Stopping without other stroke prevention medication to take the place of Xarelto may increase your risk of developing a clot that causes a stroke.  Refill your prescription before you run out.  After discharge, you should have regular check-up appointments with your healthcare provider that is prescribing your Xarelto.  In the future your dose may need to be changed if your kidney function or weight changes by a significant amount.  What do you do if you miss a dose? If you are taking Xarelto ONCE DAILY and  you miss a dose, take it as soon as you remember on the same day then continue your regularly scheduled once daily regimen the next day. Do not take two doses of Xarelto at the same time or on the same day.   Important Safety Information A possible side effect of Xarelto is bleeding. You should call your healthcare provider right away if you experience any of the following: Bleeding from an injury or your nose that does not stop. Unusual colored urine (red or dark brown) or unusual colored stools (red or black). Unusual bruising for unknown reasons. A serious fall or if you hit your head (even if there is no bleeding).  Some medicines may interact with Xarelto and might increase your risk of bleeding while on Xarelto. To help avoid this, consult your healthcare provider or pharmacist prior to using any new prescription or non-prescription medications, including herbals, vitamins, non-steroidal anti-inflammatory drugs (NSAIDs) and supplements.  This website has more information on Xarelto: VisitDestination.com.br.

## 2022-07-19 ENCOUNTER — Inpatient Hospital Stay (HOSPITAL_COMMUNITY): Payer: Medicare PPO

## 2022-07-19 ENCOUNTER — Inpatient Hospital Stay (HOSPITAL_COMMUNITY): Payer: Medicare PPO | Admitting: Certified Registered Nurse Anesthetist

## 2022-07-19 ENCOUNTER — Encounter (HOSPITAL_COMMUNITY)
Admission: EM | Disposition: A | Discharge status: Skilled Nursing Facility | Payer: Self-pay | Source: Home / Self Care | Attending: Family Medicine

## 2022-07-19 ENCOUNTER — Other Ambulatory Visit: Payer: Self-pay

## 2022-07-19 ENCOUNTER — Encounter (HOSPITAL_COMMUNITY): Payer: Self-pay | Admitting: Student

## 2022-07-19 DIAGNOSIS — S3210XA Unspecified fracture of sacrum, initial encounter for closed fracture: Secondary | ICD-10-CM

## 2022-07-19 DIAGNOSIS — I4891 Unspecified atrial fibrillation: Secondary | ICD-10-CM

## 2022-07-19 DIAGNOSIS — Z85828 Personal history of other malignant neoplasm of skin: Secondary | ICD-10-CM

## 2022-07-19 DIAGNOSIS — D631 Anemia in chronic kidney disease: Secondary | ICD-10-CM | POA: Diagnosis not present

## 2022-07-19 DIAGNOSIS — M1710 Unilateral primary osteoarthritis, unspecified knee: Secondary | ICD-10-CM | POA: Diagnosis not present

## 2022-07-19 DIAGNOSIS — K5730 Diverticulosis of large intestine without perforation or abscess without bleeding: Secondary | ICD-10-CM

## 2022-07-19 DIAGNOSIS — E538 Deficiency of other specified B group vitamins: Secondary | ICD-10-CM

## 2022-07-19 DIAGNOSIS — E039 Hypothyroidism, unspecified: Secondary | ICD-10-CM | POA: Diagnosis not present

## 2022-07-19 DIAGNOSIS — I130 Hypertensive heart and chronic kidney disease with heart failure and stage 1 through stage 4 chronic kidney disease, or unspecified chronic kidney disease: Secondary | ICD-10-CM | POA: Diagnosis not present

## 2022-07-19 DIAGNOSIS — E119 Type 2 diabetes mellitus without complications: Secondary | ICD-10-CM | POA: Diagnosis not present

## 2022-07-19 DIAGNOSIS — H40053 Ocular hypertension, bilateral: Secondary | ICD-10-CM

## 2022-07-19 DIAGNOSIS — S329XXS Fracture of unspecified parts of lumbosacral spine and pelvis, sequela: Secondary | ICD-10-CM | POA: Diagnosis not present

## 2022-07-19 DIAGNOSIS — M160 Bilateral primary osteoarthritis of hip: Secondary | ICD-10-CM

## 2022-07-19 DIAGNOSIS — Z95818 Presence of other cardiac implants and grafts: Secondary | ICD-10-CM

## 2022-07-19 DIAGNOSIS — Z9181 History of falling: Secondary | ICD-10-CM

## 2022-07-19 DIAGNOSIS — N1832 Chronic kidney disease, stage 3b: Secondary | ICD-10-CM | POA: Diagnosis not present

## 2022-07-19 DIAGNOSIS — I129 Hypertensive chronic kidney disease with stage 1 through stage 4 chronic kidney disease, or unspecified chronic kidney disease: Secondary | ICD-10-CM

## 2022-07-19 DIAGNOSIS — I509 Heart failure, unspecified: Secondary | ICD-10-CM | POA: Diagnosis not present

## 2022-07-19 DIAGNOSIS — I4892 Unspecified atrial flutter: Secondary | ICD-10-CM | POA: Diagnosis not present

## 2022-07-19 DIAGNOSIS — N183 Chronic kidney disease, stage 3 unspecified: Secondary | ICD-10-CM

## 2022-07-19 DIAGNOSIS — I4821 Permanent atrial fibrillation: Secondary | ICD-10-CM | POA: Diagnosis not present

## 2022-07-19 DIAGNOSIS — W010XXD Fall on same level from slipping, tripping and stumbling without subsequent striking against object, subsequent encounter: Secondary | ICD-10-CM

## 2022-07-19 DIAGNOSIS — Z7901 Long term (current) use of anticoagulants: Secondary | ICD-10-CM

## 2022-07-19 DIAGNOSIS — S32110D Nondisplaced Zone I fracture of sacrum, subsequent encounter for fracture with routine healing: Secondary | ICD-10-CM

## 2022-07-19 DIAGNOSIS — Z96659 Presence of unspecified artificial knee joint: Secondary | ICD-10-CM

## 2022-07-19 HISTORY — PX: ORIF PELVIC FRACTURE WITH PERCUTANEOUS SCREWS: SHX6800

## 2022-07-19 LAB — BASIC METABOLIC PANEL
Anion gap: 11 (ref 5–15)
BUN: 16 mg/dL (ref 8–23)
CO2: 20 mmol/L — ABNORMAL LOW (ref 22–32)
Calcium: 9.5 mg/dL (ref 8.9–10.3)
Chloride: 103 mmol/L (ref 98–111)
Creatinine, Ser: 1.07 mg/dL — ABNORMAL HIGH (ref 0.44–1.00)
GFR, Estimated: 51 mL/min — ABNORMAL LOW (ref >60)
Glucose, Bld: 89 mg/dL (ref 70–99)
Potassium: 4 mmol/L (ref 3.5–5.1)
Sodium: 134 mmol/L — ABNORMAL LOW (ref 135–145)

## 2022-07-19 LAB — CBC
HCT: 34 % — ABNORMAL LOW (ref 36.0–46.0)
Hemoglobin: 11 g/dL — ABNORMAL LOW (ref 12.0–15.0)
MCH: 32.3 pg (ref 26.0–34.0)
MCHC: 32.4 g/dL (ref 30.0–36.0)
MCV: 99.7 fL (ref 80.0–100.0)
Platelets: 369 10*3/uL (ref 150–400)
RBC: 3.41 MIL/uL — ABNORMAL LOW (ref 3.87–5.11)
RDW: 14.1 % (ref 11.5–15.5)
WBC: 6.6 10*3/uL (ref 4.0–10.5)
nRBC: 0 % (ref 0.0–0.2)

## 2022-07-19 SURGERY — CLOSED REDUCTION, PELVIS, WITH PERCUTANEOUS FIXATION
Anesthesia: General

## 2022-07-19 MED ORDER — ONDANSETRON HCL 4 MG/2ML IJ SOLN
INTRAMUSCULAR | Status: DC | PRN
  Administered 2022-07-19: 4 mg via INTRAVENOUS

## 2022-07-19 MED ORDER — METHOCARBAMOL 500 MG PO TABS
500.0000 mg | ORAL_TABLET | Freq: Four times a day (QID) | ORAL | Status: DC | PRN
  Administered 2022-07-19 – 2022-07-22 (×8): 500 mg via ORAL
  Filled 2022-07-19 (×8): qty 1

## 2022-07-19 MED ORDER — SUGAMMADEX SODIUM 200 MG/2ML IV SOLN
INTRAVENOUS | Status: DC | PRN
  Administered 2022-07-19: 125 mg via INTRAVENOUS

## 2022-07-19 MED ORDER — ROCURONIUM BROMIDE 10 MG/ML (PF) SYRINGE
PREFILLED_SYRINGE | INTRAVENOUS | Status: AC
  Filled 2022-07-19: qty 10

## 2022-07-19 MED ORDER — TRANEXAMIC ACID-NACL 1000-0.7 MG/100ML-% IV SOLN
1000.0000 mg | Freq: Once | INTRAVENOUS | Status: AC
  Administered 2022-07-19: 1000 mg via INTRAVENOUS
  Filled 2022-07-19: qty 100

## 2022-07-19 MED ORDER — CEFAZOLIN SODIUM-DEXTROSE 2-4 GM/100ML-% IV SOLN
2.0000 g | Freq: Three times a day (TID) | INTRAVENOUS | Status: AC
  Administered 2022-07-19 – 2022-07-20 (×3): 2 g via INTRAVENOUS
  Filled 2022-07-19 (×3): qty 100

## 2022-07-19 MED ORDER — LIDOCAINE 2% (20 MG/ML) 5 ML SYRINGE
INTRAMUSCULAR | Status: AC
  Filled 2022-07-19: qty 5

## 2022-07-19 MED ORDER — ACETAMINOPHEN 10 MG/ML IV SOLN
INTRAVENOUS | Status: DC | PRN
  Administered 2022-07-19: 1000 mg via INTRAVENOUS

## 2022-07-19 MED ORDER — ROCURONIUM BROMIDE 10 MG/ML (PF) SYRINGE
PREFILLED_SYRINGE | INTRAVENOUS | Status: DC | PRN
  Administered 2022-07-19: 40 mg via INTRAVENOUS

## 2022-07-19 MED ORDER — CEFAZOLIN SODIUM-DEXTROSE 2-4 GM/100ML-% IV SOLN: 2.0000 g | INTRAVENOUS | Status: DC

## 2022-07-19 MED ORDER — CHLORHEXIDINE GLUCONATE 4 % EX SOLN: 60.0000 mL | Freq: Once | CUTANEOUS | Status: DC

## 2022-07-19 MED ORDER — OXYCODONE HCL 5 MG PO TABS
5.0000 mg | ORAL_TABLET | ORAL | Status: DC | PRN
  Administered 2022-07-19 – 2022-07-22 (×16): 5 mg via ORAL
  Filled 2022-07-19 (×16): qty 1

## 2022-07-19 MED ORDER — PHENYLEPHRINE 80 MCG/ML (10ML) SYRINGE FOR IV PUSH (FOR BLOOD PRESSURE SUPPORT)
PREFILLED_SYRINGE | INTRAVENOUS | Status: AC
  Filled 2022-07-19: qty 10

## 2022-07-19 MED ORDER — CEFAZOLIN SODIUM-DEXTROSE 2-3 GM-%(50ML) IV SOLR
INTRAVENOUS | Status: DC | PRN
  Administered 2022-07-19: 2 g via INTRAVENOUS

## 2022-07-19 MED ORDER — 0.9 % SODIUM CHLORIDE (POUR BTL) OPTIME
TOPICAL | Status: DC | PRN
  Administered 2022-07-19: 1000 mL

## 2022-07-19 MED ORDER — ONDANSETRON HCL 4 MG/2ML IJ SOLN: 4.0000 mg | Freq: Four times a day (QID) | INTRAMUSCULAR | Status: DC | PRN

## 2022-07-19 MED ORDER — PROPOFOL 10 MG/ML IV BOLUS
INTRAVENOUS | Status: AC
  Filled 2022-07-19: qty 20

## 2022-07-19 MED ORDER — SODIUM CHLORIDE 0.9 % IV SOLN: INTRAVENOUS | Status: DC

## 2022-07-19 MED ORDER — DROPERIDOL 2.5 MG/ML IJ SOLN: 0.6250 mg | Freq: Once | INTRAMUSCULAR | Status: DC | PRN

## 2022-07-19 MED ORDER — PROPOFOL 10 MG/ML IV BOLUS
INTRAVENOUS | Status: DC | PRN
  Administered 2022-07-19: 70 mg via INTRAVENOUS

## 2022-07-19 MED ORDER — DEXAMETHASONE SODIUM PHOSPHATE 10 MG/ML IJ SOLN
INTRAMUSCULAR | Status: AC
  Filled 2022-07-19: qty 1

## 2022-07-19 MED ORDER — POVIDONE-IODINE 10 % EX SWAB: 2.0000 | Freq: Once | CUTANEOUS | Status: DC

## 2022-07-19 MED ORDER — PHENYLEPHRINE 80 MCG/ML (10ML) SYRINGE FOR IV PUSH (FOR BLOOD PRESSURE SUPPORT)
PREFILLED_SYRINGE | INTRAVENOUS | Status: DC | PRN
  Administered 2022-07-19 (×2): 40 ug via INTRAVENOUS
  Administered 2022-07-19 (×2): 80 ug via INTRAVENOUS

## 2022-07-19 MED ORDER — ONDANSETRON HCL 4 MG PO TABS: 4.0000 mg | ORAL_TABLET | Freq: Four times a day (QID) | ORAL | Status: DC | PRN

## 2022-07-19 MED ORDER — FENTANYL CITRATE (PF) 100 MCG/2ML IJ SOLN
INTRAMUSCULAR | Status: AC
  Filled 2022-07-19: qty 2

## 2022-07-19 MED ORDER — FENTANYL CITRATE (PF) 250 MCG/5ML IJ SOLN
INTRAMUSCULAR | Status: AC
  Filled 2022-07-19: qty 5

## 2022-07-19 MED ORDER — FENTANYL CITRATE (PF) 250 MCG/5ML IJ SOLN
INTRAMUSCULAR | Status: DC | PRN
  Administered 2022-07-19: 25 ug via INTRAVENOUS
  Administered 2022-07-19: 50 ug via INTRAVENOUS

## 2022-07-19 MED ORDER — ONDANSETRON HCL 4 MG/2ML IJ SOLN
INTRAMUSCULAR | Status: AC
  Filled 2022-07-19: qty 2

## 2022-07-19 MED ORDER — METOCLOPRAMIDE HCL 5 MG/ML IJ SOLN: 5.0000 mg | Freq: Three times a day (TID) | INTRAMUSCULAR | Status: DC | PRN

## 2022-07-19 MED ORDER — FENTANYL CITRATE (PF) 100 MCG/2ML IJ SOLN
25.0000 ug | INTRAMUSCULAR | Status: DC | PRN
  Administered 2022-07-19: 25 ug via INTRAVENOUS

## 2022-07-19 MED ORDER — PHENYLEPHRINE HCL-NACL 20-0.9 MG/250ML-% IV SOLN
INTRAVENOUS | Status: DC | PRN
  Administered 2022-07-19: 25 ug/min via INTRAVENOUS

## 2022-07-19 MED ORDER — METHOCARBAMOL 1000 MG/10ML IJ SOLN: 500.0000 mg | Freq: Four times a day (QID) | INTRAVENOUS | Status: DC | PRN

## 2022-07-19 MED ORDER — POLYETHYLENE GLYCOL 3350 17 G PO PACK
17.0000 g | PACK | Freq: Every day | ORAL | Status: DC | PRN
  Administered 2022-07-21: 17 g via ORAL
  Filled 2022-07-19: qty 1

## 2022-07-19 MED ORDER — METOCLOPRAMIDE HCL 5 MG PO TABS: 5.0000 mg | ORAL_TABLET | Freq: Three times a day (TID) | ORAL | Status: DC | PRN

## 2022-07-19 MED ORDER — HYDROMORPHONE HCL 1 MG/ML IJ SOLN: 0.5000 mg | INTRAMUSCULAR | Status: DC | PRN

## 2022-07-19 MED ORDER — LACTATED RINGERS IV SOLN: INTRAVENOUS | Status: DC | PRN

## 2022-07-19 MED ORDER — DEXAMETHASONE SODIUM PHOSPHATE 10 MG/ML IJ SOLN
INTRAMUSCULAR | Status: DC | PRN
  Administered 2022-07-19: 4 mg via INTRAVENOUS

## 2022-07-19 MED ORDER — LIDOCAINE 2% (20 MG/ML) 5 ML SYRINGE
INTRAMUSCULAR | Status: DC | PRN
  Administered 2022-07-19: 50 mg via INTRAVENOUS

## 2022-07-19 MED ORDER — DOCUSATE SODIUM 100 MG PO CAPS
100.0000 mg | ORAL_CAPSULE | Freq: Two times a day (BID) | ORAL | Status: DC
  Administered 2022-07-19 – 2022-07-22 (×6): 100 mg via ORAL
  Filled 2022-07-19 (×6): qty 1

## 2022-07-19 SURGICAL SUPPLY — 44 items
ADH SKN CLS APL DERMABOND .7 (GAUZE/BANDAGES/DRESSINGS) ×1
APL PRP STRL LF DISP 70% ISPRP (MISCELLANEOUS) ×1
BAG COUNTER SPONGE SURGICOUNT (BAG) ×1 IMPLANT
BAG SPNG CNTER NS LX DISP (BAG) ×1
BIT DRILL CANN 4.5MM (BIT) IMPLANT
BLADE CLIPPER SURG (BLADE) IMPLANT
BLADE SURG 11 STRL SS (BLADE) ×1 IMPLANT
CHLORAPREP W/TINT 26 (MISCELLANEOUS) ×1 IMPLANT
DERMABOND ADVANCED .7 DNX12 (GAUZE/BANDAGES/DRESSINGS) IMPLANT
DRAPE C-ARM 42X72 X-RAY (DRAPES) ×1 IMPLANT
DRAPE C-ARMOR (DRAPES) ×1 IMPLANT
DRAPE HALF SHEET 40X57 (DRAPES) ×1 IMPLANT
DRAPE INCISE IOBAN 66X45 STRL (DRAPES) ×1 IMPLANT
DRAPE SURG 17X23 STRL (DRAPES) ×6 IMPLANT
DRAPE U-SHAPE 47X51 STRL (DRAPES) ×1 IMPLANT
DRESSING MEPILEX FLEX 4X4 (GAUZE/BANDAGES/DRESSINGS) IMPLANT
DRSG MEPILEX FLEX 4X4 (GAUZE/BANDAGES/DRESSINGS) ×2
DRSG MEPILEX POST OP 4X8 (GAUZE/BANDAGES/DRESSINGS) IMPLANT
ELECT REM PT RETURN 9FT ADLT (ELECTROSURGICAL) ×1
ELECTRODE REM PT RTRN 9FT ADLT (ELECTROSURGICAL) ×1 IMPLANT
GLOVE BIO SURGEON STRL SZ 6.5 (GLOVE) ×3 IMPLANT
GLOVE BIO SURGEON STRL SZ7.5 (GLOVE) ×4 IMPLANT
GLOVE BIOGEL PI IND STRL 6.5 (GLOVE) ×1 IMPLANT
GLOVE BIOGEL PI IND STRL 7.5 (GLOVE) ×1 IMPLANT
GOWN STRL REUS W/ TWL LRG LVL3 (GOWN DISPOSABLE) ×2 IMPLANT
GOWN STRL REUS W/TWL LRG LVL3 (GOWN DISPOSABLE) ×2
GUIDEWIRE 2.0MM (WIRE) IMPLANT
KIT BASIN OR (CUSTOM PROCEDURE TRAY) ×1 IMPLANT
KIT GUIDEWIRE CURVAFIX (WIRE) IMPLANT
KIT TURNOVER KIT B (KITS) ×1 IMPLANT
MANIFOLD NEPTUNE II (INSTRUMENTS) ×1 IMPLANT
NAIL IM ST CURVAFIX 9.5X170 (Nail) IMPLANT
NS IRRIG 1000ML POUR BTL (IV SOLUTION) ×1 IMPLANT
PACK TOTAL JOINT (CUSTOM PROCEDURE TRAY) ×1 IMPLANT
PACK UNIVERSAL I (CUSTOM PROCEDURE TRAY) ×1 IMPLANT
PAD ARMBOARD 7.5X6 YLW CONV (MISCELLANEOUS) ×2 IMPLANT
SPONGE T-LAP 18X18 ~~LOC~~+RFID (SPONGE) IMPLANT
STAPLER VISISTAT 35W (STAPLE) ×1 IMPLANT
SUCTION TUBE FRAZIER 10FR DISP (SUCTIONS) ×1 IMPLANT
SUT MNCRL AB 3-0 PS2 18 (SUTURE) ×1 IMPLANT
SUT MNCRL AB 3-0 PS2 27 (SUTURE) IMPLANT
SUT MON AB 2-0 CT1 36 (SUTURE) ×1 IMPLANT
TRAY FOLEY MTR SLVR 16FR STAT (SET/KITS/TRAYS/PACK) IMPLANT
WATER STERILE IRR 1000ML POUR (IV SOLUTION) ×1 IMPLANT

## 2022-07-19 NOTE — Op Note (Signed)
Orthopaedic Surgery Operative Note (CSN: [id number] ) Date of Surgery: 07/19/2022  Admit Date: 07/17/2022   Diagnoses: Pre-Op Diagnoses: Bilateral sacral insufficiency fractures  Post-Op Diagnosis: Same  Procedures: CPT 27216 x2-Percutaneous fixation of bilateral sacral insufficiency fractures  Surgeons : Primary: Mehar Kirkwood, Gillie Manners, MD  Assistant: Ulyses Southward, PA-C  Location: OR 3   Anesthesia: General   Antibiotics: Ancef 2g preop   Tourniquet time: None    Estimated Blood Loss: 20 mL  Complications:None   Specimens:* No specimens in log *   Implants: Implant Name Type Inv. Item Serial No. Manufacturer Lot No. LRB No. Used Action  CurvaFix IM Implant 9.42mm x    [employer] [id number] N/A 1 Implanted     Indications for Surgery: 87 year old female who sustained a fall at the end of May sustained a lateral compression pelvic ring injury she continued to have pain but was doing well until she started developing left-sided posterior pelvis pain.  She was readmitted and CT scan showed bilateral sacral insufficiency fractures.  Due to the continued pain and inability to weight-bear and ambulate recommended proceeding with percutaneous fixation of posterior pelvic ring.  Risks and benefits were discussed with the patient and her daughter.  Risks included but not limited to bleeding, infection, malunion, nonunion, hardware failure, hardware irritation, nerve and blood vessel injury, DVT, even the possibility anesthetic complications.  They agreed to proceed with surgery and consent was obtained.  Operative Findings: Percutaneous fixation bilateral sacral insufficiency fractures using CurvaFix 9.5 x 170 mm device  Procedure: The patient was identified in the preoperative holding area. Consent was confirmed with the patient and their family and all questions were answered. The operative extremity was marked after confirmation with the patient. she was then brought back to the  operating room by our anesthesia colleagues.  She was placed under general anesthetic and carefully transferred over to radiolucent flattop table.  A sacral bump was placed under her pelvis to elevate the pelvis for appropriate starting point for screw fixation.  Fluoroscopic imaging showed the pelvis and adequate imaging was available.  The pelvis was then prepped and draped in usual sterile fashion.  A timeout was performed to verify the patient, the procedure, and the extremity.  Preoperative antibiotics were dosed.  Using inlet and outlet views I percutaneously placed a 2.0 mm guidewire at the appropriate starting point.  I then advanced it into the lateral ilium approximately 1 cm.  I then cut down on this with 11 blade.  I then used a 4.5 mm drill bit to oscillate across the right SI joint for a good trajectory for the screw device.  I then remove this and guided a bent ball-tipped guidewire across the back of the pelvis using inlet and outlet views as a guide to make sure that it was safe in S1 body.  I then advanced it across the left SI joint and the outer table of the lateral ilium.  I then used an entry reamer to enter the right sided SI joint.  I then reamed from 7 mm to 8 mm.  I passed a exchange tube to remove the ball-tipped guidewire and placed a single guidewire.  I then placed the 9.5 x 170 mm screw device across the back of the pelvis.  I gained excellent purchase crossing the left SI joint and into the lateral ilium.  I then locked the device to create a rigid construct.  Final fluoroscopic imaging was obtained.  The incision was copiously irrigated.  A layered closure of 2-0 Monocryl and Dermabond was used to close the skin.  Sterile dressing was applied.  The patient was then awoke from anesthesia and taken to the PACU in stable condition.  Post Op Plan/Instructions: Patient will be weightbearing as tolerated to bilateral lower extremities.  She will receive postoperative Ancef.  She  will receive aspirin for DVT prophylaxis.  Will have her mobilize with physical and Occupational Therapy.  I was present and performed the entire surgery.  Ulyses Southward, PA-C did assist me throughout the case. An assistant was necessary given the difficulty in approach, maintenance of reduction and ability to instrument the fracture.   Truitt Merle, MD [facility]

## 2022-07-19 NOTE — Interval H&P Note (Signed)
History and Physical Interval Note:  07/19/2022 7:28 AM  Amy [name]  has presented today for surgery, with the diagnosis of Right Sacral Fracture.  The various methods of treatment have been discussed with the patient and family. After consideration of risks, benefits and other options for treatment, the patient has consented to  Procedure(s): ORIF PELVIC FRACTURE WITH PERCUTANEOUS SCREWS (N/A) as a surgical intervention.  The patient's history has been reviewed, patient examined, no change in status, stable for surgery.  I have reviewed the patient's chart and labs.  Questions were answered to the patient's satisfaction.     Caryn Bee P Edris Friedt

## 2022-07-19 NOTE — Anesthesia Procedure Notes (Signed)
Procedure Name: Intubation Date/Time: 07/19/2022 7:45 AM  Performed by: Garfield Cornea, CRNAPre-anesthesia Checklist: Patient identified, Emergency Drugs available, Suction available and Patient being monitored Patient Re-evaluated:Patient Re-evaluated prior to induction Oxygen Delivery Method: Circle System Utilized Preoxygenation: Pre-oxygenation with 100% oxygen Induction Type: IV induction Ventilation: Mask ventilation without difficulty Laryngoscope Size: Mac and 3 Grade View: Grade I Tube type: Oral Tube size: 6.5 mm Number of attempts: 1 Airway Equipment and Method: Stylet Placement Confirmation: ETT inserted through vocal cords under direct vision, positive ETCO2 and breath sounds checked- equal and bilateral Secured at: 21 cm Tube secured with: Tape Dental Injury: Teeth and Oropharynx as per pre-operative assessment

## 2022-07-19 NOTE — TOC Initial Note (Addendum)
Transition of Care [facility]) - Initial/Assessment Note    Patient Details  Name: Amy [name] MRN: [id number] Date of Birth: [birth year]  [facility]) CM/SW Contact:    Epifanio Lesches, RN Phone Number: 07/19/2022, 5:16 PM  Clinical Narrative:                 Readmitted with worsening pelvic fx pain. From home with spouse. PTA active [facility] and would like to continue using them @ d/c.  Resumption of home health orders will be needed from MD to continue home health services @ d/c if needed. Pt with RW, BSC, rollator and toilet riser @ home.  TOC team following and will continue assisting with needs....    Expected Discharge Plan: Home w Home Health Services Barriers to Discharge: Continued Medical Work up   Patient Goals and CMS Choice     Choice offered to / list presented to : Spouse      Expected Discharge Plan and Services   Discharge Planning Services: CM Consult   Living arrangements for the past 2 months: Single Family Home                                      Prior Living Arrangements/Services Living arrangements for the past 2 months: Single Family Home Lives with:: Spouse Patient language and need for interpreter reviewed:: Yes Do you feel safe going back to the place where you live?: Yes      Need for Family Participation in Patient Care: Yes (Comment) Care giver support system in place?: Yes (comment) Current home services: DME Criminal Activity/Legal Involvement Pertinent to Current Situation/Hospitalization: No - Comment as needed  Activities of Daily Living Home Assistive Devices/Equipment: Dan Humphreys (specify type) ADL Screening (condition at time of admission) Patient's cognitive ability adequate to safely complete daily activities?: Yes Is the patient deaf or have difficulty hearing?: No Does the patient have difficulty seeing, even when wearing glasses/contacts?: No Does the patient have difficulty concentrating, remembering,  or making decisions?: No Patient able to express need for assistance with ADLs?: Yes Does the patient have difficulty dressing or bathing?: No Independently performs ADLs?: Yes (appropriate for developmental age) Does the patient have difficulty walking or climbing stairs?: Yes Weakness of Legs: Right Weakness of Arms/Hands: None  Permission Sought/Granted                  Emotional Assessment       Orientation: : Oriented to Self Alcohol / Substance Use: Not Applicable Psych Involvement: No (comment)  Admission diagnosis:  Pelvic fracture (HCC) [S32.9XXA] Intractable pain [R52] Multiple closed fractures of pelvis with unstable disruption of pelvic ring, sequela [S32.811S] Patient Active Problem List   Diagnosis Date Noted   Protein-calorie malnutrition, severe 07/18/2022   Sacral fracture, closed (HCC) 07/17/2022   Left buttock pain 07/17/2022   Benign hypertension with stage 3a chronic kidney disease (HCC) 06/21/2022   Increased intraocular pressure, bilateral 06/21/2022   CAP (community acquired pneumonia) 06/18/2022   Pelvic fracture (HCC) 06/18/2022   Anemia 04/30/2022   Chronic anticoagulation 02/20/2022   Laceration of brow without complication 02/20/2022   Hypertensive urgency 02/15/2022   Fall from ground level 02/15/2022   Syncope and collapse 02/15/2022   Contusion of face 02/15/2022   Renal artery stenosis (HCC), right - monitored by vascular 01/05/2022   B12 deficiency 10/30/2021   Anxiety and depression 10/02/2021  Insomnia 04/23/2021   Paresthesias in right hand 04/23/2021   Pain in joint of left knee 12/01/2020   Osteoporosis 11/23/2019   CKD (chronic kidney disease) stage 3, GFR 30-59 ml/min (HCC) 11/22/2019   Hyperglycemia 11/22/2019   Degenerative arthritis of knee, bilateral 04/04/2017   Chronic idiopathic constipation 02/18/2017   Tinnitus of both ears 12/03/2016   Acquired hypothyroidism 01/28/2016   Primary osteoarthritis involving  multiple joints 01/28/2016   Overactive bladder 01/26/2016   Atrial fibrillation (HCC) 06/05/2010   Essential hypertension 09/03/2008   ARTHROSCOPY, KNEE, HX OF 09/03/2008   PCP:  Pincus Sanes, MD Pharmacy:   [facility] [phone] - [place], [place] - [address] [facility] [address] [place] [address] Phone: [phone] Fax: [phone]  [facility] [place], [place] - [address] [place] [address] Phone: [phone] Fax: [phone]     Social Determinants of Health (SDOH) Social History: SDOH Screenings   Food Insecurity: No Food Insecurity (07/18/2022)  Housing: Low Risk  (07/18/2022)  Transportation Needs: No Transportation Needs (07/18/2022)  Utilities: Not At Risk (07/18/2022)  Alcohol Screen: Low Risk  (06/26/2021)  Depression (PHQ2-9): High Risk (07/17/2022)  Financial Resource Strain: Low Risk  (06/26/2021)  Physical Activity: Sufficiently Active (06/26/2021)  Social Connections: Socially Integrated (06/26/2021)  Stress: No Stress Concern Present (06/26/2021)  Tobacco Use: Low Risk  (07/19/2022)   SDOH Interventions:     Readmission Risk Interventions     No data to display

## 2022-07-19 NOTE — Progress Notes (Addendum)
PROGRESS NOTE   Amy [name]  ZOX:[id number] DOB: 12-03-1935 DOA: 07/17/2022 PCP: Pincus Sanes, MD   Date of Service: the patient was seen and examined on 07/19/2022  Brief Narrative:  This is a 87 year old female with past medical history of HTN, atrial fibrillation/flutter (sp ablation, maintained on Tambocor and Xarelto), hypothyroidism, T2DM  w/ nephropathy, and CKD IIIb.  Her last dose of Xarelto was 06/14/2022.  On Sunday 06/16/2022 patient tripped on a carpet while leaving church.  The, fall resulted in pelvic bone fracture.  CT readings Nondisplaced fracture of the right superior pubic ramus. Nondisplaced fracture of the right sacral ala. Patient was recently discharged to [facility] rehab where she spent the last month.  Patient discharged home on 6/22.  At the time of discharge patient's pain was minimal.  Since being home developed in her left hip and has progressively worsened.  Imaging done on the ER imaging shows worsening of her pelvis ramus fracture plus new fractures.  Patient admitted to hospital for orthopedic surgery.  Assessment and Plan:  Right sacral fracture -Surgery scheduled for today with Dr Jena Gauss -Pain control: tylenol, oxycodone, dilaudid -Hold Xarelto, last taken 03/16/22, possibly restart tomorrow if hemoglobin is stable and no signs of bleeding -PT/OT -Purewick placement  -SCDs for DVT prophylaxis   Hypothyroidism -Continue Synthroid   Atrial fibrillation  -Hold Xarelto in setting of surgery -Continue flecainide   Essential hypertension BP 122/58 -Continue hydralazine, atenolol, clonidine, Norvasc, and losartan  Normocytic anemia Hemoglobin 11 today, 10.7 yesterday.  Baseline appears to be 9-10 -Monitor with CBC postoperatively   Stage 3a CKD  Cr 1.07, stable at baseline -Continue to monitor with BMP  Subjective:  Saw patient postoperatively today.  She had some pain but relatively well controlled. Updated husband at bedside.  Physical  Exam:  Vitals:   07/18/22 1141 07/18/22 1448 07/18/22 2048 07/19/22 0433  BP: (!) 140/68 124/77 121/65 (!) 122/58  Pulse: 97 90 87 87  Resp:  16 16 16   Temp:  (!) 97.5 F (36.4 C) 97.7 F (36.5 C) 97.9 F (36.6 C)  TempSrc:  Oral Oral Oral  SpO2: 97% 97% 96% 98%  Weight:      Height:       General: Alert, no acute distress, frail appearing  Cardio: Well-perfused Pulm:  normal work of breathing Extremities: No peripheral edema.  Neuro: Cranial nerves grossly intact   I have personally reviewed and interpreted labs, imaging.   CBC: Recent Labs  Lab 07/17/22 2235 07/18/22 0859 07/19/22 0350  WBC 8.1 6.7 6.6  NEUTROABS 5.8 5.1  --   HGB 10.7* 10.7* 11.0*  HCT 33.5* 33.5* 34.0*  MCV 101.5* 100.9* 99.7  PLT 358 341 369    Basic Metabolic Panel: Recent Labs  Lab 07/17/22 2235 07/18/22 0859 07/19/22 0350  NA 135 135 134*  K 3.8 3.8 4.0  CL 101 105 103  CO2 21* 21* 20*  GLUCOSE 105* 102* 89  BUN 24* 20 16  CREATININE 1.42* 1.12* 1.07*  CALCIUM 9.6 9.1 9.5    GFR: Estimated Creatinine Clearance: 32.4 mL/min (A) (by C-G formula based on SCr of 1.07 mg/dL (H)). Liver Function Tests: Recent Labs  Lab 07/18/22 0859  AST 17  ALT 13  ALKPHOS 183*  BILITOT 0.8  PROT 6.5  ALBUMIN 3.6    Coagulation Profile: Recent Labs  Lab 07/18/22 0859  INR 1.0    Code Status:  DNR.  Code status decision has been confirmed with: patient Family  Communication: daughter updated at bedside     Severity of Illness:  The appropriate patient status for this patient is INPATIENT. Inpatient status is judged to be reasonable and necessary in order to provide the required intensity of service to ensure the patient's safety. The patient's presenting symptoms, physical exam findings, and initial radiographic and laboratory data in the context of their chronic comorbidities is felt to place them at high risk for further clinical deterioration. Furthermore, it is not anticipated  that the patient will be medically stable for discharge from the hospital within 2 midnights of admission.   * I certify that at the point of admission it is my clinical judgment that the patient will require inpatient hospital care spanning beyond 2 midnights from the point of admission due to high intensity of service, high risk for further deterioration and high frequency of surveillance required.*  Author:  Rolm Gala MD  07/19/2022 8:36 AM

## 2022-07-19 NOTE — Progress Notes (Signed)
Physical Therapy Treatment Patient Details Name: Amy [name] MRN: [id number] DOB: October 19, 1935 Today's Date: 07/19/2022   History of Present Illness 87 year old female admitted 6/26 with hip pain, found worsening of her pelvis ramus fracture plus new fractures. PTA, 06/16/2022 had fall with pelvic fx. 6/28 S/p x2-Percutaneous fixation of bilateral sacral insufficiency fractures.  PMHx: HTN, atrial fibrillation/flutter (sp ablation, maintained on Tambocor and Xarelto), hypothyroidism, T2DM  w/ nephropathy, and CKD IIIb.    PT Comments    Tolerated post-op day #0 PT visit well, demonstrating need for min assist with bed mobility and transfer to recliner this afternoon. Gait deferred due to dizziness which resolved quickly upon sitting. SpO2 95 on RA during session; 99% on 2L supplemental O2 pre and post activity. Still reporting pain left posterior hip, improves with gentle repositioning and pillow support to unload lightly in chair. Good family support. Anticipate she will progress quickly and be safe for d/c home with husband's support and HHPT follow-up. We will follow up tomorrow to further progress gait and ensure she can safely navigate steps to enter home. Patient will continue to benefit from skilled physical therapy services to further improve independence with functional mobility.   Recommendations for follow up therapy are one component of a multi-disciplinary discharge planning process, led by the attending physician.  Recommendations may be updated based on patient status, additional functional criteria and insurance authorization.     Assistance Recommended at Discharge Intermittent Supervision/Assistance  Patient can return home with the following A little help with walking and/or transfers;A little help with bathing/dressing/bathroom;Assistance with cooking/housework;Assist for transportation   Equipment Recommendations  None recommended by PT       Precautions / Restrictions  Precautions Precautions: Fall Restrictions Weight Bearing Restrictions: Yes RLE Weight Bearing: Weight bearing as tolerated LLE Weight Bearing: Weight bearing as tolerated     Mobility  Bed Mobility Overal bed mobility: Needs Assistance Bed Mobility: Supine to Sit     Supine to sit: Min guard     General bed mobility comments: Min guard for safety, extra time, cues for rail use, slow to rise, pain when leaning on Lt hip, improved leaning towards Rt.    Transfers Overall transfer level: Needs assistance Equipment used: Rolling walker (2 wheels) Transfers: Sit to/from Stand, Bed to chair/wheelchair/BSC Sit to Stand: Min guard   Step pivot transfers: Min guard       General transfer comment: Min guard for safety to rise from EOB, slight instability, cues for RW use for support. Step pivot transfer to recliner without buckling, tolerated WB bil well. Reported dizziness, but resolved upon sitting.    Ambulation/Gait               General Gait Details: Deferred due to dizziness in standing   Stairs             Wheelchair Mobility    Modified Rankin (Stroke Patients Only)       Balance Overall balance assessment: Needs assistance Sitting-balance support: No upper extremity supported, Feet supported Sitting balance-Leahy Scale: Good   Postural control: Right lateral lean Standing balance support: Bilateral upper extremity supported, Reliant on assistive device for balance Standing balance-Leahy Scale: Poor Standing balance comment: very light support on RW.                            Cognition Arousal/Alertness: Awake/alert Behavior During Therapy: WFL for tasks assessed/performed Overall Cognitive Status: Within Functional Limits for tasks  assessed                                          Exercises General Exercises - Lower Extremity Ankle Circles/Pumps: AROM, Both, 10 reps, Seated Quad Sets: AAROM, Both, 10 reps,  Seated Gluteal Sets: Strengthening, Both, 10 reps, Seated    General Comments General comments (skin integrity, edema, etc.): SpO2 95% on RA during session. 99% on 2L pre and post activity. Husband present and very supportive.      Pertinent Vitals/Pain Pain Assessment Pain Assessment: Faces Faces Pain Scale: Hurts little more Pain Location: Lt hip Pain Descriptors / Indicators: Aching Pain Intervention(s): Monitored during session, Repositioned, Premedicated before session, Limited activity within patient's tolerance    Home Living                          Prior Function            PT Goals (current goals can now be found in the care plan section) Acute Rehab PT Goals Patient Stated Goal: Go home PT Goal Formulation: With patient Time For Goal Achievement: 08/01/22 Potential to Achieve Goals: Good Progress towards PT goals: Progressing toward goals    Frequency    Min 5X/week      PT Plan Current plan remains appropriate;Frequency needs to be updated    Co-evaluation              AM-PAC PT "6 Clicks" Mobility   Outcome Measure  Help needed turning from your back to your side while in a flat bed without using bedrails?: None Help needed moving from lying on your back to sitting on the side of a flat bed without using bedrails?: A Little Help needed moving to and from a bed to a chair (including a wheelchair)?: A Little Help needed standing up from a chair using your arms (e.g., wheelchair or bedside chair)?: A Little Help needed to walk in hospital room?: A Little Help needed climbing 3-5 steps with a railing? : A Lot 6 Click Score: 18    End of Session Equipment Utilized During Treatment: Gait belt Activity Tolerance: Patient tolerated treatment well;Other (comment) (Gait held due to dizziness post-op) Patient left: in chair;with call bell/phone within reach;with chair alarm set;with family/visitor present Nurse Communication: Mobility  status PT Visit Diagnosis: Other abnormalities of gait and mobility (R26.89);Muscle weakness (generalized) (M62.81);History of falling (Z91.81);Pain;Difficulty in walking, not elsewhere classified (R26.2) Pain - Right/Left: Left Pain - part of body: Hip     Time: 1610-9604 PT Time Calculation (min) (ACUTE ONLY): 32 min  Charges:  $Therapeutic Activity: 23-37 mins                     Kathlyn Sacramento, PT, DPT [facility]  Rehabilitation Services Physical Therapist Office: [phone] Website: [email or web address]    Berton Mount 07/19/2022, 4:04 PM

## 2022-07-19 NOTE — Transfer of Care (Signed)
Immediate Anesthesia Transfer of Care Note  Patient: Amy [name]  Procedure(s) Performed: ORIF PELVIC FRACTURE WITH PERCUTANEOUS SCREWS  Patient Location: PACU  Anesthesia Type:General  Level of Consciousness: drowsy  Airway & Oxygen Therapy: Patient Spontanous Breathing and Patient connected to nasal cannula oxygen  Post-op Assessment: Report given to RN and Post -op Vital signs reviewed and stable  Post vital signs: Reviewed and stable  Last Vitals:  Vitals Value Taken Time  BP 162/84 07/19/22 0907  Temp    Pulse 93 07/19/22 0910  Resp 16 07/19/22 0910  SpO2 98 % 07/19/22 0910  Vitals shown include unvalidated device data.  Last Pain:  Vitals:   07/19/22 0450  TempSrc:   PainSc: 0-No pain      Patients Stated Pain Goal: 0 (07/19/22 0420)  Complications: No notable events documented.

## 2022-07-19 NOTE — Plan of Care (Signed)

## 2022-07-19 NOTE — Progress Notes (Signed)
PT Cancellation Note  Patient Details Name: Amy [name] MRN: [id number] DOB: [birth year]   Cancelled Treatment:    Reason Eval/Treat Not Completed: Patient at procedure or test/unavailable  Taken to OR. Will continue to follow.   Kathlyn Sacramento, PT, DPT [facility]  Rehabilitation Services Physical Therapist Office: [phone] Website: [email or web address]

## 2022-07-19 NOTE — Progress Notes (Signed)
Physical Therapy Treatment Patient Details Name: Amy [name] MRN: [id number] DOB: 06/24/1935 Today's Date: 07/19/2022   History of Present Illness 87 year old female admitted 6/26 with hip pain, found worsening of her pelvis ramus fracture plus new fractures. PTA, 06/16/2022 had fall with pelvic fx. 6/28 S/p x2-Percutaneous fixation of bilateral sacral insufficiency fractures.  PMHx: HTN, atrial fibrillation/flutter (sp ablation, maintained on Tambocor and Xarelto), hypothyroidism, T2DM  w/ nephropathy, and CKD IIIb.    PT Comments    Pt seated in recliner and very uncomfortable up in chair.  Husband came in hall requesting back to bed.  PTA entered to assist in mobility back to bed.  Pt remains to require min to min G assistance this pm.  Placed cushion under B hips in bed to reduce pain.  Pt requesting pain meds.    Recommendations for follow up therapy are one component of a multi-disciplinary discharge planning process, led by the attending physician.  Recommendations may be updated based on patient status, additional functional criteria and insurance authorization.  Follow Up Recommendations       Assistance Recommended at Discharge Intermittent Supervision/Assistance  Patient can return home with the following A little help with walking and/or transfers;A little help with bathing/dressing/bathroom;Assistance with cooking/housework;Assist for transportation   Equipment Recommendations  None recommended by PT    Recommendations for Other Services       Precautions / Restrictions Precautions Precautions: Fall Restrictions Weight Bearing Restrictions: Yes RLE Weight Bearing: Weight bearing as tolerated LLE Weight Bearing: Weight bearing as tolerated     Mobility  Bed Mobility Overal bed mobility: Needs Assistance Bed Mobility: Sit to Supine       Sit to supine: Min assist   General bed mobility comments: Min assistance to lift B LEs up into bed against gravity.     Transfers Overall transfer level: Needs assistance Equipment used: Rolling walker (2 wheels) Transfers: Sit to/from Stand Sit to Stand: Min guard           General transfer comment: Cues for hand placement to and from seated surface.  Stood from chair and edge of bed.  ON second standing trial placed geo mat under her bottom due to pain.    Ambulation/Gait Ambulation/Gait assistance: Min guard Gait Distance (Feet):  (steps from recliner back to bed.) Assistive device: Rolling walker (2 wheels) Gait Pattern/deviations: Step-to pattern, Trunk flexed Gait velocity: decr     General Gait Details: Cues for RW placement and sequencing to back to seated surface.   Stairs             Wheelchair Mobility    Modified Rankin (Stroke Patients Only)       Balance Overall balance assessment: Needs assistance Sitting-balance support: No upper extremity supported, Feet supported Sitting balance-Leahy Scale: Good       Standing balance-Leahy Scale: Poor Standing balance comment: very light support on RW.                            Cognition Arousal/Alertness: Awake/alert Behavior During Therapy: WFL for tasks assessed/performed Overall Cognitive Status: Within Functional Limits for tasks assessed                                          Exercises      General Comments General comments (skin integrity, edema, etc.): SpO2 95% on  RA during session. 99% on 2L pre and post activity. Husband present and very supportive.      Pertinent Vitals/Pain Pain Assessment Pain Assessment: Faces Faces Pain Scale: Hurts even more Pain Location: Lt hip Pain Descriptors / Indicators: Aching Pain Intervention(s): Monitored during session, Repositioned    Home Living                          Prior Function            PT Goals (current goals can now be found in the care plan section) Acute Rehab PT Goals Patient Stated Goal: Go back  to bed PT Goal Formulation: With patient Time For Goal Achievement: 08/01/22 Potential to Achieve Goals: Good Progress towards PT goals: Progressing toward goals    Frequency    Min 5X/week      PT Plan Current plan remains appropriate;Frequency needs to be updated    Co-evaluation              AM-PAC PT "6 Clicks" Mobility   Outcome Measure  Help needed turning from your back to your side while in a flat bed without using bedrails?: None Help needed moving from lying on your back to sitting on the side of a flat bed without using bedrails?: A Little Help needed moving to and from a bed to a chair (including a wheelchair)?: A Little Help needed standing up from a chair using your arms (e.g., wheelchair or bedside chair)?: A Little Help needed to walk in hospital room?: A Little Help needed climbing 3-5 steps with a railing? : A Lot 6 Click Score: 18    End of Session Equipment Utilized During Treatment: Gait belt Activity Tolerance: Patient tolerated treatment well;Other (comment) Patient left: in chair;with call bell/phone within reach;with chair alarm set;with family/visitor present Nurse Communication: Mobility status PT Visit Diagnosis: Other abnormalities of gait and mobility (R26.89);Muscle weakness (generalized) (M62.81);History of falling (Z91.81);Pain;Difficulty in walking, not elsewhere classified (R26.2) Pain - Right/Left: Left Pain - part of body: Hip     Time: 6295-2841 PT Time Calculation (min) (ACUTE ONLY): 12 min  Charges:  $Therapeutic Activity: 8-22 mins                     Bonney Leitz , PTA Acute Rehabilitation Services Office [phone]    Shivaay Stormont Artis Delay 07/19/2022, 4:25 PM

## 2022-07-20 DIAGNOSIS — S329XXS Fracture of unspecified parts of lumbosacral spine and pelvis, sequela: Secondary | ICD-10-CM | POA: Diagnosis not present

## 2022-07-20 LAB — BASIC METABOLIC PANEL
Anion gap: 11 (ref 5–15)
BUN: 17 mg/dL (ref 8–23)
CO2: 20 mmol/L — ABNORMAL LOW (ref 22–32)
Calcium: 9 mg/dL (ref 8.9–10.3)
Chloride: 103 mmol/L (ref 98–111)
Creatinine, Ser: 1.19 mg/dL — ABNORMAL HIGH (ref 0.44–1.00)
GFR, Estimated: 45 mL/min — ABNORMAL LOW (ref >60)
Glucose, Bld: 112 mg/dL — ABNORMAL HIGH (ref 70–99)
Potassium: 4.1 mmol/L (ref 3.5–5.1)
Sodium: 134 mmol/L — ABNORMAL LOW (ref 135–145)

## 2022-07-20 LAB — CBC
HCT: 29.8 % — ABNORMAL LOW (ref 36.0–46.0)
Hemoglobin: 9.4 g/dL — ABNORMAL LOW (ref 12.0–15.0)
MCH: 31.8 pg (ref 26.0–34.0)
MCHC: 31.5 g/dL (ref 30.0–36.0)
MCV: 100.7 fL — ABNORMAL HIGH (ref 80.0–100.0)
Platelets: 321 10*3/uL (ref 150–400)
RBC: 2.96 MIL/uL — ABNORMAL LOW (ref 3.87–5.11)
RDW: 14 % (ref 11.5–15.5)
WBC: 8.1 10*3/uL (ref 4.0–10.5)
nRBC: 0 % (ref 0.0–0.2)

## 2022-07-20 LAB — VITAMIN D 25 HYDROXY (VIT D DEFICIENCY, FRACTURES): Vit D, 25-Hydroxy: 39.34 ng/mL (ref 30–100)

## 2022-07-20 MED ORDER — RIVAROXABAN 15 MG PO TABS
15.0000 mg | ORAL_TABLET | Freq: Every day | ORAL | Status: DC
  Administered 2022-07-20 – 2022-07-22 (×3): 15 mg via ORAL
  Filled 2022-07-20 (×3): qty 1

## 2022-07-20 NOTE — Evaluation (Signed)
Occupational Therapy Evaluation Patient Details Name: Amy [name] MRN: [id number] DOB: 09-22-1935 Today's Date: 07/20/2022   History of Present Illness 87 year old female admitted 6/26 with hip pain, found worsening of her pelvis ramus fracture plus new fractures. PTA, 06/16/2022 had fall with pelvic fx. 6/28 S/p x2-Percutaneous fixation of bilateral sacral insufficiency fractures.  PMHx: HTN, atrial fibrillation/flutter (sp ablation, maintained on Tambocor and Xarelto), hypothyroidism, T2DM  w/ nephropathy, and CKD IIIb.   Clinical Impression   Pt admitted for above dx, PTA patient lived with spouse who cannot provide physical assist and has no other family members in the nearby area. Pt was ind in bADLs PTA and was her husbands caregiver, pt son in law reports that patient has not been mobile as much in the last week and been mostly bed bound. Pt currently presenting with bilat hip pain with mobility and decreased activity tolerance. Pt's hip pain limits her ability to complete lower body ADLs and she is min A for functional mobility. Pt would benefit from continued acute skilled OT services to address deficits and help transition to next level of care. Patient would benefit from post acute skilled rehab facility with <3 hours of therapy and 24/7 support       Recommendations for follow up therapy are one component of a multi-disciplinary discharge planning process, led by the attending physician.  Recommendations may be updated based on patient status, additional functional criteria and insurance authorization.   Assistance Recommended at Discharge Frequent or constant Supervision/Assistance  Patient can return home with the following A little help with walking and/or transfers;A lot of help with bathing/dressing/bathroom;Assistance with cooking/housework;Assist for transportation;Help with stairs or ramp for entrance    Functional Status Assessment  Patient has had a recent decline in their  functional status and demonstrates the ability to make significant improvements in function in a reasonable and predictable amount of time.  Equipment Recommendations  None recommended by OT (defer to next level of care)    Recommendations for Other Services       Precautions / Restrictions Precautions Precautions: Fall Precaution Comments: Watch HR Restrictions Weight Bearing Restrictions: Yes RLE Weight Bearing: Weight bearing as tolerated LLE Weight Bearing: Weight bearing as tolerated      Mobility Bed Mobility Overal bed mobility: Needs Assistance Bed Mobility: Supine to Sit     Supine to sit: Min guard Sit to supine: Min assist   General bed mobility comments: Pt needgin assist to postion BLEs over bed clearance.    Transfers Overall transfer level: Needs assistance Equipment used: Rolling walker (2 wheels) Transfers: Sit to/from Stand Sit to Stand: Min assist                  Balance Overall balance assessment: Needs assistance Sitting-balance support: No upper extremity supported, Feet supported Sitting balance-Leahy Scale: Good     Standing balance support: Bilateral upper extremity supported, Reliant on assistive device for balance Standing balance-Leahy Scale: Poor                             ADL either performed or assessed with clinical judgement   ADL Overall ADL's : Needs assistance/impaired Eating/Feeding: Independent;Sitting   Grooming: Standing;Min guard   Upper Body Bathing: Sitting;Set up;Supervision/ safety   Lower Body Bathing: Sitting/lateral leans;Moderate assistance   Upper Body Dressing : Sitting;Supervision/safety;Set up   Lower Body Dressing: Sitting/lateral leans;Maximal assistance Lower Body Dressing Details (indicate cue type and reason): Pt  can don L sock with min guard but Max A to don/doff R sock Toilet Transfer: Minimal assistance;Ambulation;Rolling walker (2 wheels)   Toileting- Clothing Manipulation  and Hygiene: Moderate assistance;Sit to/from Nurse, children's Details (indicate cue type and reason): NT Functional mobility during ADLs: Minimal assistance;Rolling walker (2 wheels)       Vision         Perception     Praxis      Pertinent Vitals/Pain Pain Assessment Pain Assessment: Faces Faces Pain Scale: Hurts even more Pain Location: Hips with mobility Pain Descriptors / Indicators: Aching, Grimacing, Guarding Pain Intervention(s): Monitored during session, Repositioned, Limited activity within patient's tolerance     Hand Dominance Right   Extremity/Trunk Assessment Upper Extremity Assessment Upper Extremity Assessment: Generalized weakness   Lower Extremity Assessment Lower Extremity Assessment: Generalized weakness       Communication Communication Communication: No difficulties   Cognition Arousal/Alertness: Awake/alert Behavior During Therapy: WFL for tasks assessed/performed Overall Cognitive Status: Within Functional Limits for tasks assessed                                 General Comments: Pt family reports pt has been having cognitive decline as of recent and more episodes of agitation, not noted during today's session     General Comments  Pt HR sitting in 90s at rest, increased to 127 with functional mobility, returned back down to 90s when returning to resting bed position    Exercises     Shoulder Instructions      Home Living Family/patient expects to be discharged to:: Private residence Living Arrangements: Spouse/significant other Available Help at Discharge: Family;Available 24 hours/day Type of Home: House Home Access: Stairs to enter [employer] of Steps: 1 Entrance Stairs-Rails: None Home Layout: Two level;Able to live on main level with bedroom/bathroom;Full bath on main level Alternate Level Stairs-Number of Steps: Patient does not go to basement   Bathroom Shower/Tub: Scientist, research (life sciences): Standard Bathroom Accessibility: Yes   Home Equipment: Agricultural consultant (2 wheels);Cane - single point;BSC/3in1;Shower seat;Toilet riser;Tub bench   Additional Comments: 2 falls in the last year      Prior Functioning/Environment Prior Level of Function : History of Falls (last six months)             Mobility Comments: Progressed back to household mobility following recent d/c from SNF ADLs Comments: ind- bird bath at baseline        OT Problem List: Decreased strength;Decreased activity tolerance;Impaired balance (sitting and/or standing);Pain      OT Treatment/Interventions: Self-care/ADL training;Balance training;Therapeutic exercise;Patient/family education;Therapeutic activities    OT Goals(Current goals can be found in the care plan section) Acute Rehab OT Goals Patient Stated Goal: To walk better OT Goal Formulation: With patient Time For Goal Achievement: 08/03/22 Potential to Achieve Goals: Good ADL Goals Pt Will Perform Grooming: with supervision;standing Pt Will Perform Lower Body Bathing: with supervision;sit to/from stand Pt Will Perform Lower Body Dressing: sitting/lateral leans;with supervision Pt Will Transfer to Toilet: ambulating;with supervision  OT Frequency: Min 2X/week    Co-evaluation              AM-PAC OT "6 Clicks" Daily Activity     Outcome Measure Help from another person eating meals?: None Help from another person taking care of personal grooming?: A Little Help from another person toileting, which includes using toliet, bedpan, or urinal?:  A Little Help from another person bathing (including washing, rinsing, drying)?: A Lot Help from another person to put on and taking off regular upper body clothing?: None Help from another person to put on and taking off regular lower body clothing?: A Lot 6 Click Score: 18   End of Session Equipment Utilized During Treatment: Gait belt;Rolling walker (2 wheels) Nurse  Communication: Mobility status  Activity Tolerance: Patient tolerated treatment well Patient left: in bed;with call bell/phone within reach;with bed alarm set;with family/visitor present  OT Visit Diagnosis: Unsteadiness on feet (R26.81);Other abnormalities of gait and mobility (R26.89);History of falling (Z91.81);Pain;Muscle weakness (generalized) (M62.81) Pain - part of body: Hip                Time: 1538-1600 OT Time Calculation (min): 22 min Charges:  OT General Charges $OT Visit: 1 Visit OT Evaluation $OT Eval Low Complexity: 1 Low  07/20/2022  AB, OTR/L  Acute Rehabilitation Services  Office: [phone]   Tristan Schroeder 07/20/2022, 4:38 PM

## 2022-07-20 NOTE — Progress Notes (Signed)
Physical Therapy Treatment Patient Details Name: Amy [name] MRN: [id number] DOB: [birth year] Today's Date: 07/20/2022   History of Present Illness 87 year old female admitted 6/26 with hip pain, found worsening of her pelvis ramus fracture plus new fractures. PTA, 06/16/2022 had fall with pelvic fx. 6/28 S/p x2-Percutaneous fixation of bilateral sacral insufficiency fractures.  PMHx: HTN, atrial fibrillation/flutter (sp ablation, maintained on Tambocor and Xarelto), hypothyroidism, T2DM  w/ nephropathy, and CKD IIIb.    PT Comments    Pt received in supine, c/o pain despite PO med premedication ~90 mins prior but with good participation and fair tolerance for transfer and gait training with +2 chair follow for safety. Pt needing up to minA for safety with gait/transfers using RW with mod cues for safe technique, pt mildly impulsive due to pain/fear of pain which increases her fall risk. Pt and daughter report her only assist at home is spouse who is 17 years old and has back problems so he cannot provide safe lifting assist for her. Pt typically manages most household chores/tasks and currently unable to mobilize safely OOB without constant physical assist for safety, pt in too much pain for typical household distance gait tasks or stair training, discussed disposition with pt/daughter and both agreeable to lower intensity post-acute rehab placement prior to return home, discussed also with supervising PT Logan B and updated below.   Recommendations for follow up therapy are one component of a multi-disciplinary discharge planning process, led by the attending physician.  Recommendations may be updated based on patient status, additional functional criteria and insurance authorization.  Follow Up Recommendations  Can patient physically be transported by private vehicle: No    Assistance Recommended at Discharge Intermittent Supervision/Assistance  Patient can return home with the following A  little help with bathing/dressing/bathroom;Assistance with cooking/housework;Assist for transportation;A lot of help with walking and/or transfers (chair follow safety)   Equipment Recommendations  None recommended by PT    Recommendations for Other Services       Precautions / Restrictions Precautions Precautions: Fall Restrictions Weight Bearing Restrictions: Yes RLE Weight Bearing: Weight bearing as tolerated LLE Weight Bearing: Weight bearing as tolerated     Mobility  Bed Mobility Overal bed mobility: Needs Assistance Bed Mobility: Supine to Sit     Supine to sit: Min guard     General bed mobility comments: Increased time/effort to perform, pt needing hospital bed rail but able to move BLE to EOB without physical assist    Transfers Overall transfer level: Needs assistance Equipment used: Rolling walker (2 wheels) Transfers: Sit to/from Stand Sit to Stand: Min guard, Min assist           General transfer comment: Cues for hand placement to and from seated surface.  EOB>RW and RW>chair with geomat cushion in in. Pt reclined in chair but lying toward her R side in recliner due to L hip pain. Encouraged her not to cross her legs while resting to promote neutral hip/pelvis posture for healing.    Ambulation/Gait Ambulation/Gait assistance: Min assist, +2 safety/equipment Gait Distance (Feet): 15 Feet Assistive device: Rolling walker (2 wheels) Gait Pattern/deviations: Step-to pattern, Trunk flexed, Antalgic, Narrow base of support Gait velocity: decr     General Gait Details: Cues for RW placement and sequencing, chair follow for safety and pt needed chair pulled up to sit after 69ft. Pt defers further distance gait or stair training at that time due to acute pain. HR/SpO2 WFL on RA.      Balance Overall balance  assessment: Needs assistance Sitting-balance support: No upper extremity supported, Feet supported Sitting balance-Leahy Scale: Good        Standing balance-Leahy Scale: Poor Standing balance comment: very light support on RW.                            Cognition Arousal/Alertness: Awake/alert Behavior During Therapy: WFL for tasks assessed/performed Overall Cognitive Status: Within Functional Limits for tasks assessed                                 General Comments: Pt with decreased safety awareness, mild impulsivity due to pain, poor insight into defcits. Pt amenable to discussion for SNF placement on DC today given limited mobility progress.        Exercises      General Comments General comments (skin integrity, edema, etc.): BP 105/53 supine prior to OOB and WFL seated EOB. No dizziness or lightheadedness reported with sitting or standing posture. Purewick in place in supine and chair postures for pt safety.      Pertinent Vitals/Pain Pain Assessment Pain Assessment: Faces Faces Pain Scale: Hurts even more Pain Location: L hip with mobility tasks and sitting in recliner Pain Descriptors / Indicators: Aching, Grimacing, Guarding Pain Intervention(s): Monitored during session, Limited activity within patient's tolerance, Premedicated before session, Repositioned, Ice applied     PT Goals (current goals can now be found in the care plan section) Acute Rehab PT Goals Patient Stated Goal: Less pain in my L hip PT Goal Formulation: With patient Time For Goal Achievement: 08/01/22 Progress towards PT goals: Progressing toward goals    Frequency    Min 5X/week      PT Plan Current plan remains appropriate       AM-PAC PT "6 Clicks" Mobility   Outcome Measure  Help needed turning from your back to your side while in a flat bed without using bedrails?: None Help needed moving from lying on your back to sitting on the side of a flat bed without using bedrails?: A Little Help needed moving to and from a bed to a chair (including a wheelchair)?: A Little Help needed standing up  from a chair using your arms (e.g., wheelchair or bedside chair)?: A Little Help needed to walk in hospital room?: A Lot Help needed climbing 3-5 steps with a railing? : Total 6 Click Score: 16    End of Session Equipment Utilized During Treatment: Gait belt Activity Tolerance: Patient limited by pain Patient left: in chair;with call bell/phone within reach;with chair alarm set;with nursing/sitter in room Nurse Communication: Mobility status;Patient requests pain meds PT Visit Diagnosis: Other abnormalities of gait and mobility (R26.89);Muscle weakness (generalized) (M62.81);History of falling (Z91.81);Pain;Difficulty in walking, not elsewhere classified (R26.2) Pain - Right/Left: Left Pain - part of body: Hip     Time: 1610-9604 PT Time Calculation (min) (ACUTE ONLY): 28 min  Charges:  $Gait Training: 8-22 mins $Therapeutic Activity: 8-22 mins                     Raymona Boss P., PTA Acute Rehabilitation Services Secure Chat Preferred 9a-5:30pm Office: [phone]    [facility] 07/20/2022, 11:32 AM

## 2022-07-20 NOTE — Plan of Care (Signed)

## 2022-07-20 NOTE — Progress Notes (Signed)
     Subjective:  Patient reports pain as mild to moderate, feels well managed today.  Family member at bedside.  Slept okay.  Hgb 9.4, can restart Xarelto.  Denies chest pain, shortness of breath, dizziness, N/V, numbness or tingling.  Objective:   VITALS:   Vitals:   07/19/22 1504 07/19/22 2006 07/19/22 2347 07/20/22 0336  BP: 119/64 119/68 119/72 119/65  Pulse: (!) 107   86  Resp: 19     Temp: 98.8 F (37.1 C) 98.6 F (37 C) (!) 97.5 F (36.4 C) (!) 97.2 F (36.2 C)  TempSrc:  Oral Axillary Axillary  SpO2: 95%   97%  Weight:      Height:        Neurovascular intact Intact pulses distally Dorsiflexion/Plantar flexion intact Incision: dressing C/D/I Compartment soft Wiggles toes appropriately  Lab Results  Component Value Date   WBC 8.1 07/20/2022   HGB 9.4 (L) 07/20/2022   HCT 29.8 (L) 07/20/2022   MCV 100.7 (H) 07/20/2022   PLT 321 07/20/2022   BMET    Component Value Date/Time   NA 134 (L) 07/20/2022 0200   NA 139 02/07/2022 1335   K 4.1 07/20/2022 0200   CL 103 07/20/2022 0200   CO2 20 (L) 07/20/2022 0200   GLUCOSE 112 (H) 07/20/2022 0200   BUN 17 07/20/2022 0200   BUN 32 (H) 02/07/2022 1335   CREATININE 1.19 (H) 07/20/2022 0200   CREATININE 1.50 (H) 01/04/2016 0914   CALCIUM 9.0 07/20/2022 0200   EGFR 31 (L) 02/07/2022 1335   GFRNONAA 45 (L) 07/20/2022 0200    Xray: Stable post-op images  Assessment/Plan: 1 Day Post-Op   Procedure(s) (LRB): ORIF PELVIC FRACTURE WITH PERCUTANEOUS SCREWS (N/A)  Principal Problem:   Pelvic fracture (HCC) Active Problems:   Essential hypertension   Atrial fibrillation (HCC)   Acquired hypothyroidism   Anxiety and depression   B12 deficiency   Chronic anticoagulation   Benign hypertension with stage 3a chronic kidney disease (HCC)   Protein-calorie malnutrition, severe   Post Op Plan/Instructions: Patient will be weightbearing as tolerated to bilateral lower extremities.  She will receive postoperative  Ancef.  She will receive aspirin for DVT prophylaxis.  Will have her mobilize with physical and Occupational Therapy.  Hgb 9.4.  Okay to discharge from orthopedic standpoint.  Anticipate DC with HHPT vs SNF, TOC team helping with this.   Cecil Cobbs 07/20/2022, 7:19 AM   Weber Cooks, MD  Contact information:   [phone] 7am-5pm epic message Dr. Blanchie Dessert, or call office for patient follow up: [phone] After hours and holidays please check [email or web address] for group call information for [facility]

## 2022-07-20 NOTE — Plan of Care (Signed)
  Problem: Education: Goal: Knowledge of General Education information will improve Description: Including pain rating scale, medication(s)/side effects and non-pharmacologic comfort measures Outcome: Progressing   Problem: Activity: Goal: Risk for activity intolerance will decrease Outcome: Progressing   Problem: Nutrition: Goal: Adequate nutrition will be maintained Outcome: Progressing   

## 2022-07-20 NOTE — Progress Notes (Signed)
PROGRESS NOTE   Amy [name]  XBJ:[id number] DOB: 10-20-1935 DOA: 07/17/2022 PCP: Pincus Sanes, MD   Date of Service: the patient was seen and examined on 07/20/2022  Brief Narrative:  This is a 87 year old female with past medical history of HTN, atrial fibrillation/flutter (sp ablation, maintained on Tambocor and Xarelto), hypothyroidism, T2DM  w/ nephropathy, and CKD IIIb.  Her last dose of Xarelto was 06/14/2022.  On Sunday 06/16/2022 patient tripped on a carpet while leaving church.  The, fall resulted in pelvic bone fracture.  CT readings Nondisplaced fracture of the right superior pubic ramus. Nondisplaced fracture of the right sacral ala. Patient was recently discharged to [facility] rehab where she spent the last month.  Patient discharged home on 6/22.  At the time of discharge patient's pain was minimal.  Since being home developed in her left hip and has progressively worsened.  Imaging done on the ER imaging shows worsening of her pelvis ramus fracture plus new fractures.  Patient admitted to hospital for orthopedic surgery.  Assessment and Plan:  Right sacral fracture POD#1 sacral fracture repair  -Pain control: tylenol, oxycodone, dilaudid -Can restart Xarelto, Hb stable at 9.4 today  -PT/OT -Purewick placement    Mood disorder Patient was sad this morning she said "take me to heaven" and "I rather not be here". She does not have a history of depression but due to her recent recurrent falls has started to feel sad. She is mostly sad about the loss of independence.  Denies suicidal ideation or suicidal plan -Continue to monitor -Consider psych consult if pt becomes actively suicidal  Hypothyroidism -Continue Synthroid   Atrial fibrillation  -Restart Xarelto -Continue flecainide   Essential hypertension BP 121/72 -Continue hydralazine, atenolol, clonidine, Norvasc, and losartan  Normocytic anemia Hemoglobin 9.4 today, 11 yesterday.  Baseline   9-10 -Monitor with CBC  postoperatively   Stage 3a CKD  Cr 1.19, stable at baseline -Continue to monitor with BMP  Subjective: Tearful this morning.  Finding it difficult to manage the pain and her loss of independence. She finds the chair uncomfortable and rather sit on the edge of her bed.   Physical Exam:  Vitals:   07/19/22 2006 07/19/22 2347 07/20/22 0336 07/20/22 0839  BP: 119/68 119/72 119/65 121/72  Pulse:   86 89  Resp:    18  Temp: 98.6 F (37 C) (!) 97.5 F (36.4 C) (!) 97.2 F (36.2 C) 97.8 F (36.6 C)  TempSrc: Oral Axillary Axillary Oral  SpO2:   97% 97%  Weight:      Height:       General: Alert, no acute distress, frail appearing, sitting in chair  Cardio: Well-perfused Pulm:  normal work of breathing Extremities: No peripheral edema.  Neuro: Cranial nerves grossly intact  I have personally reviewed and interpreted labs, imaging.   CBC: Recent Labs  Lab 07/17/22 2235 07/18/22 0859 07/19/22 0350 07/20/22 0200  WBC 8.1 6.7 6.6 8.1  NEUTROABS 5.8 5.1  --   --   HGB 10.7* 10.7* 11.0* 9.4*  HCT 33.5* 33.5* 34.0* 29.8*  MCV 101.5* 100.9* 99.7 100.7*  PLT 358 341 369 321    Basic Metabolic Panel: Recent Labs  Lab 07/17/22 2235 07/18/22 0859 07/19/22 0350 07/20/22 0200  NA 135 135 134* 134*  K 3.8 3.8 4.0 4.1  CL 101 105 103 103  CO2 21* 21* 20* 20*  GLUCOSE 105* 102* 89 112*  BUN 24* 20 16 17   CREATININE 1.42* 1.12* 1.07*  1.19*  CALCIUM 9.6 9.1 9.5 9.0    GFR: Estimated Creatinine Clearance: 29.1 mL/min (A) (by C-G formula based on SCr of 1.19 mg/dL (H)). Liver Function Tests: Recent Labs  Lab 07/18/22 0859  AST 17  ALT 13  ALKPHOS 183*  BILITOT 0.8  PROT 6.5  ALBUMIN 3.6     Coagulation Profile: Recent Labs  Lab 07/18/22 0859  INR 1.0     Code Status:  DNR.  Code status decision has been confirmed with: patient Family Communication: daughter updated at bedside     Severity of Illness:  The appropriate patient status for this patient is  INPATIENT. Inpatient status is judged to be reasonable and necessary in order to provide the required intensity of service to ensure the patient's safety. The patient's presenting symptoms, physical exam findings, and initial radiographic and laboratory data in the context of their chronic comorbidities is felt to place them at high risk for further clinical deterioration. Furthermore, it is not anticipated that the patient will be medically stable for discharge from the hospital within 2 midnights of admission.   * I certify that at the point of admission it is my clinical judgment that the patient will require inpatient hospital care spanning beyond 2 midnights from the point of admission due to high intensity of service, high risk for further deterioration and high frequency of surveillance required.*  Author:  Rolm Gala MD  07/20/2022 8:42 AM

## 2022-07-20 NOTE — Progress Notes (Signed)
Fracture Care Post-operative Anticoagulation per Pharmacy Consult  29 yof s/p sacral fracture repair 6/28. Pharmacy consulted to resume PTA Xarelto for afib on POD1 if Hg >/= 9. Hg 10.7 on admission (baseline 9-10), slightly down to 9.4 today. Plt WNL. SCr stable 1.19. No bleed issues reported.  Plan: Resume Xarelto 15mg  PO Qsupper as PTA Monitor CBC, SCr trend, s/sx bleeding   Leia Alf, PharmD, BCPS Please check AMION for all [facility] contact numbers Clinical Pharmacist 07/20/2022 10:01 AM

## 2022-07-21 DIAGNOSIS — S329XXS Fracture of unspecified parts of lumbosacral spine and pelvis, sequela: Secondary | ICD-10-CM | POA: Diagnosis not present

## 2022-07-21 LAB — BASIC METABOLIC PANEL
Anion gap: 10 (ref 5–15)
BUN: 13 mg/dL (ref 8–23)
CO2: 20 mmol/L — ABNORMAL LOW (ref 22–32)
Calcium: 9.1 mg/dL (ref 8.9–10.3)
Chloride: 105 mmol/L (ref 98–111)
Creatinine, Ser: 1.05 mg/dL — ABNORMAL HIGH (ref 0.44–1.00)
GFR, Estimated: 52 mL/min — ABNORMAL LOW (ref >60)
Glucose, Bld: 98 mg/dL (ref 70–99)
Potassium: 4.1 mmol/L (ref 3.5–5.1)
Sodium: 135 mmol/L (ref 135–145)

## 2022-07-21 LAB — CBC
HCT: 28.5 % — ABNORMAL LOW (ref 36.0–46.0)
Hemoglobin: 9.3 g/dL — ABNORMAL LOW (ref 12.0–15.0)
MCH: 33.2 pg (ref 26.0–34.0)
MCHC: 32.6 g/dL (ref 30.0–36.0)
MCV: 101.8 fL — ABNORMAL HIGH (ref 80.0–100.0)
Platelets: 288 10*3/uL (ref 150–400)
RBC: 2.8 MIL/uL — ABNORMAL LOW (ref 3.87–5.11)
RDW: 14.3 % (ref 11.5–15.5)
WBC: 8.9 10*3/uL (ref 4.0–10.5)
nRBC: 0 % (ref 0.0–0.2)

## 2022-07-21 LAB — VITAMIN B12: Vitamin B-12: 1248 pg/mL — ABNORMAL HIGH (ref 180–914)

## 2022-07-21 LAB — FOLATE: Folate: 11.3 ng/mL (ref >5.9)

## 2022-07-21 MED ORDER — RIVAROXABAN 15 MG PO TABS: 15.0000 mg | ORAL_TABLET | Freq: Every day | ORAL | Status: DC

## 2022-07-21 MED ORDER — MORPHINE SULFATE (PF) 2 MG/ML IV SOLN: 1.0000 mg | INTRAVENOUS | Status: DC | PRN

## 2022-07-21 MED ORDER — POLYETHYLENE GLYCOL 3350 17 G PO PACK
17.0000 g | PACK | Freq: Two times a day (BID) | ORAL | Status: DC
  Administered 2022-07-21 – 2022-07-22 (×2): 17 g via ORAL
  Filled 2022-07-21 (×2): qty 1

## 2022-07-21 MED ORDER — ACETAMINOPHEN 325 MG PO TABS
650.0000 mg | ORAL_TABLET | Freq: Four times a day (QID) | ORAL | Status: DC
  Administered 2022-07-21 – 2022-07-22 (×4): 650 mg via ORAL
  Filled 2022-07-21 (×4): qty 2

## 2022-07-21 MED ORDER — SENNA 8.6 MG PO TABS
1.0000 | ORAL_TABLET | Freq: Every day | ORAL | Status: DC
  Administered 2022-07-21 – 2022-07-22 (×2): 8.6 mg via ORAL
  Filled 2022-07-21 (×2): qty 1

## 2022-07-21 NOTE — NC FL2 (Signed)
[place] MEDICAID FL2 LEVEL OF CARE FORM     IDENTIFICATION  Patient Name: Amy [name] Birthdate: January 15, 1936 Sex: female Admission Date (Current Location): 07/17/2022  [facility] and [place] Number:  [employer] and Address:  [facility] [place], [address] [place], [place] [address]      Provider Number: [id number]  Attending Physician Name and Address:  Cathleen Corti, MD  Relative Name and Phone Number:       Current Level of Care: Hospital Recommended Level of Care: Skilled Nursing Facility Prior Approval Number:    Date Approved/Denied:   PASRR Number: [id number]  Discharge Plan: SNF    Current Diagnoses: Patient Active Problem List   Diagnosis Date Noted   Protein-calorie malnutrition, severe 07/18/2022   Sacral fracture, closed (HCC) 07/17/2022   Left buttock pain 07/17/2022   Benign hypertension with stage 3a chronic kidney disease (HCC) 06/21/2022   Increased intraocular pressure, bilateral 06/21/2022   CAP (community acquired pneumonia) 06/18/2022   Pelvic fracture (HCC) 06/18/2022   Anemia 04/30/2022   Chronic anticoagulation 02/20/2022   Laceration of brow without complication 02/20/2022   Hypertensive urgency 02/15/2022   Fall from ground level 02/15/2022   Syncope and collapse 02/15/2022   Contusion of face 02/15/2022   Renal artery stenosis (HCC), right - monitored by vascular 01/05/2022   B12 deficiency 10/30/2021   Anxiety and depression 10/02/2021   Insomnia 04/23/2021   Paresthesias in right hand 04/23/2021   Pain in joint of left knee 12/01/2020   Osteoporosis 11/23/2019   CKD (chronic kidney disease) stage 3, GFR 30-59 ml/min (HCC) 11/22/2019   Hyperglycemia 11/22/2019   Degenerative arthritis of knee, bilateral 04/04/2017   Chronic idiopathic constipation 02/18/2017   Tinnitus of both ears 12/03/2016   Acquired hypothyroidism 01/28/2016   Primary osteoarthritis involving multiple joints  01/28/2016   Overactive bladder 01/26/2016   Atrial fibrillation (HCC) 06/05/2010   Essential hypertension 09/03/2008   ARTHROSCOPY, KNEE, HX OF 09/03/2008    Orientation RESPIRATION BLADDER Height & Weight     Self, Time, Situation, Place  Normal Continent Weight: 115 lb 8.3 oz (52.4 kg) Height:  5\' 7"  (170.2 cm)  BEHAVIORAL SYMPTOMS/MOOD NEUROLOGICAL BOWEL NUTRITION STATUS      Continent Diet (carb modified)  AMBULATORY STATUS COMMUNICATION OF NEEDS Skin   Extensive Assist Verbally Surgical wounds (Left/right thigh, foam dressing: lift every shift to assess, change PRN)                       Personal Care Assistance Level of Assistance  Bathing, Feeding, Dressing Bathing Assistance: Limited assistance Feeding assistance: Limited assistance Dressing Assistance: Limited assistance     Functional Limitations Info             SPECIAL CARE FACTORS FREQUENCY  PT (By licensed PT), OT (By licensed OT)     PT Frequency: 5x/wk OT Frequency: 5x/wk            Contractures Contractures Info: Not present    Additional Factors Info  Code Status, Allergies Code Status Info: DNR Allergies Info: NKA           Current Medications (07/21/2022):  This is the current hospital active medication list Current Facility-Administered Medications  Medication Dose Route Frequency Provider Last Rate Last Admin   0.9 %  sodium chloride infusion   Intravenous Continuous West Bali, PA-C   Stopped at 07/19/22 1215   acetaminophen (TYLENOL) tablet 650 mg  650 mg Oral Q6H Patel, Halina Maidens, MD       amLODipine (NORVASC) tablet 10 mg  10 mg Oral Daily West Bali, PA-C   10 mg at 07/21/22 0818   atenolol (TENORMIN) tablet 50 mg  50 mg Oral Daily West Bali, PA-C   50 mg at 07/21/22 0818   cloNIDine (CATAPRES) tablet 0.1 mg  0.1 mg Oral TID West Bali, PA-C   0.1 mg at 07/21/22 1610   docusate sodium (COLACE) capsule 100 mg  100 mg Oral BID West Bali,  PA-C   100 mg at 07/21/22 0818   feeding supplement (ENSURE ENLIVE / ENSURE PLUS) liquid 237 mL  237 mL Oral BID BM West Bali, PA-C   237 mL at 07/19/22 1208   flecainide (TAMBOCOR) tablet 50 mg  50 mg Oral BID West Bali, PA-C   50 mg at 07/20/22 2015   hydrALAZINE (APRESOLINE) tablet 100 mg  100 mg Oral TID West Bali, PA-C   100 mg at 07/21/22 0818   lactose free nutrition (BOOST PLUS) liquid 237 mL  237 mL Oral BID BM McClung, Sarah A, PA-C       levothyroxine (SYNTHROID) tablet 50 mcg  50 mcg Oral Daily West Bali, PA-C   50 mcg at 07/21/22 9604   losartan (COZAAR) tablet 100 mg  100 mg Oral Daily West Bali, PA-C   100 mg at 07/21/22 0818   melatonin tablet 5 mg  5 mg Oral QHS West Bali, PA-C   5 mg at 07/21/22 5409   methocarbamol (ROBAXIN) tablet 500 mg  500 mg Oral Q6H PRN West Bali, PA-C   500 mg at 07/21/22 8119   Or   methocarbamol (ROBAXIN) 500 mg in dextrose 5 % 50 mL IVPB  500 mg Intravenous Q6H PRN West Bali, PA-C       metoCLOPramide (REGLAN) tablet 5-10 mg  5-10 mg Oral Q8H PRN Sharon Seller, Sarah A, PA-C       Or   metoCLOPramide (REGLAN) injection 5-10 mg  5-10 mg Intravenous Q8H PRN McClung, Sarah A, PA-C       morphine (PF) 2 MG/ML injection 1-2 mg  1-2 mg Intravenous Q4H PRN Cathleen Corti, MD       multivitamin with minerals tablet 1 tablet  1 tablet Oral Daily West Bali, PA-C   1 tablet at 07/21/22 0818   ondansetron (ZOFRAN) tablet 4 mg  4 mg Oral Q6H PRN West Bali, PA-C       Or   ondansetron (ZOFRAN) injection 4 mg  4 mg Intravenous Q6H PRN Thyra Breed A, PA-C       oxyCODONE (Oxy IR/ROXICODONE) immediate release tablet 5 mg  5 mg Oral Q4H PRN West Bali, PA-C   5 mg at 07/21/22 1478   polyethylene glycol (MIRALAX / GLYCOLAX) packet 17 g  17 g Oral BID Cathleen Corti, MD       Rivaroxaban (XARELTO) tablet 15 mg  15 mg Oral Q supper von Fredricka Bonine B, RPH   15 mg at 07/20/22 1745    senna (SENOKOT) tablet 8.6 mg  1 tablet Oral Daily Cathleen Corti, MD         Discharge Medications: Please see discharge summary for a list of discharge medications.  Relevant Imaging Results:  Relevant Lab Results:   Additional Information SS#: [id number]  Baldemar Lenis, LCSW

## 2022-07-21 NOTE — Progress Notes (Signed)
    Subjective: Patient reports pain as mild.  Tolerating diet.  Urinating.   No CP, SOB.  Hasn't mobilized much OOB with PT.  Objective:   VITALS:   Vitals:   07/20/22 1447 07/20/22 2000 07/21/22 0521 07/21/22 0747  BP: (!) 104/44 (!) 142/74 137/81 (!) 142/77  Pulse: 96 94 (!) 103 93  Resp: 18 15 15 16   Temp: 97.8 F (36.6 C) (!) 97.5 F (36.4 C) 97.7 F (36.5 C) 97.7 F (36.5 C)  TempSrc:  Oral Oral Oral  SpO2: 95% 95% 97% 98%  Weight:      Height:          Latest Ref Rng & Units 07/21/2022    5:10 AM 07/20/2022    2:00 AM 07/19/2022    3:50 AM  CBC  WBC 4.0 - 10.5 K/uL 8.9  8.1  6.6   Hemoglobin 12.0 - 15.0 g/dL 9.3  9.4  01.0   Hematocrit 36.0 - 46.0 % 28.5  29.8  34.0   Platelets 150 - 400 K/uL 288  321  369       Latest Ref Rng & Units 07/21/2022    5:10 AM 07/20/2022    2:00 AM 07/19/2022    3:50 AM  BMP  Glucose 70 - 99 mg/dL 98  272  89   BUN 8 - 23 mg/dL 13  17  16    Creatinine 0.44 - 1.00 mg/dL 5.36  6.44  0.34   Sodium 135 - 145 mmol/L 135  134  134   Potassium 3.5 - 5.1 mmol/L 4.1  4.1  4.0   Chloride 98 - 111 mmol/L 105  103  103   CO2 22 - 32 mmol/L 20  20  20    Calcium 8.9 - 10.3 mg/dL 9.1  9.0  9.5    Intake/Output      06/29 0701 06/30 0700 06/30 0701 07/01 0700   P.O. 480    I.V. (mL/kg) 98.9 (1.9)    Total Intake(mL/kg) 578.9 (11)    Urine (mL/kg/hr) 900 (0.7)    Blood     Total Output 900    Net -321.1         Urine Occurrence 3 x       Physical Exam: General: NAD.  Sitting up in bed, calm, hungry Resp: No increased wob Cardio: regular rate and rhythm ABD soft Neurologically intact MSK Neurovascularly intact Sensation intact distally Intact pulses distally Dorsiflexion/Plantar flexion intact Incision: dressing C/D/I   Assessment: 2 Days Post-Op  S/P Procedure(s) (LRB): ORIF PELVIC FRACTURE WITH PERCUTANEOUS SCREWS (N/A) by Dr. Jena Gauss on 07/19/22  Principal Problem:   Pelvic fracture (HCC) Active Problems:    Essential hypertension   Atrial fibrillation (HCC)   Acquired hypothyroidism   Anxiety and depression   B12 deficiency   Chronic anticoagulation   Benign hypertension with stage 3a chronic kidney disease (HCC)   Protein-calorie malnutrition, severe    Plan:  Advance diet Up with therapy Incentive Spirometry Elevate and Apply ice  Weightbearing: WBAT  Insicional and dressing care: Dressings left intact until follow-up and Reinforce dressings as needed Orthopedic device(s): None Showering: Keep dressing dry VTE prophylaxis: Xarelto 15mg  , SCDs, ambulation Pain control: PRN Contact information for today:  Margarita Rana MD, Levester Fresh PA-C  Dispo: Skilled Nursing Facility/Rehab once bed available     [place] Board Office [phone] 07/21/2022, 1:49 PM

## 2022-07-21 NOTE — Plan of Care (Signed)
  Problem: Activity: Goal: Risk for activity intolerance will decrease Outcome: Progressing   Problem: Coping: Goal: Level of anxiety will decrease Outcome: Progressing   Problem: Pain Managment: Goal: General experience of comfort will improve Outcome: Progressing   

## 2022-07-21 NOTE — Progress Notes (Signed)
PROGRESS NOTE   Amy [name]  UJW:[id number] DOB: 1935/01/25 DOA: 07/17/2022 PCP: Pincus Sanes, MD   Date of Service: the patient was seen and examined on 07/21/2022  Brief Narrative:  This is a 87 year old female with past medical history of HTN, atrial fibrillation/flutter (sp ablation, maintained on Tambocor and Xarelto), hypothyroidism, T2DM  w/ nephropathy, and CKD IIIb.  Her last dose of Xarelto was 06/14/2022.  On Sunday 06/16/2022 patient tripped on a carpet while leaving church.  The, fall resulted in pelvic bone fracture.  CT readings Nondisplaced fracture of the right superior pubic ramus. Nondisplaced fracture of the right sacral ala. Patient was recently discharged to [facility] rehab where she spent the last month.  Patient discharged home on 6/22.  At the time of discharge patient's pain was minimal.  Since being home developed in her left hip and has progressively worsened.  Imaging done on the ER imaging shows worsening of her pelvis ramus fracture plus new fractures.  Patient admitted to hospital for orthopedic surgery.  Assessment and Plan:  Right sacral fracture POD#2 sacral fracture repair  -Pain control: tylenol 650 mg every 6 hourly, morphine 1 to 2 mg IV every 4 as needed, oxy 5 mg every 4 as needed -Continue Xarelto -PT/OT   Acute mood disorder Feeling better in terms of mood. Denies suicidal ideation or suicidal plan today.  -Continue to monitor -Consider psych consult if pt becomes actively suicidal  Hypothyroidism -Continue Synthroid   Atrial fibrillation  -Continue Xarelto -Continue flecainide   Essential hypertension BP 142/77 -Continue hydralazine, atenolol, clonidine, Norvasc, and losartan  Normocytic anemia Hemoglobin 9.3 today, 9.4 yesterday. Baseline 9-10, MCV 100, ??megaloblastic due to nutritional deficiency -Monitor with CBC  -Check B12 and folate   Stage 3a CKD  Cr 1.05 stable at baseline -Continue to monitor with  BMP  Subjective: Feeling better today. Pain better controlled.   Physical Exam:  Vitals:   07/20/22 1447 07/20/22 2000 07/21/22 0521 07/21/22 0747  BP: (!) 104/44 (!) 142/74 137/81 (!) 142/77  Pulse: 96 94 (!) 103 93  Resp: 18 15 15 16   Temp: 97.8 F (36.6 C) (!) 97.5 F (36.4 C) 97.7 F (36.5 C) 97.7 F (36.5 C)  TempSrc:  Oral Oral Oral  SpO2: 95% 95% 97% 98%  Weight:      Height:       General: Alert, no acute distress Cardio: well perfused  Pulm: normal work of breathing Extremities: right op site dressings clean and dry  Neuro: Cranial nerves grossly intact   I have personally reviewed and interpreted labs, imaging.  CBC: Recent Labs  Lab 07/17/22 2235 07/18/22 0859 07/19/22 0350 07/20/22 0200 07/21/22 0510  WBC 8.1 6.7 6.6 8.1 8.9  NEUTROABS 5.8 5.1  --   --   --   HGB 10.7* 10.7* 11.0* 9.4* 9.3*  HCT 33.5* 33.5* 34.0* 29.8* 28.5*  MCV 101.5* 100.9* 99.7 100.7* 101.8*  PLT 358 341 369 321 288    Basic Metabolic Panel: Recent Labs  Lab 07/17/22 2235 07/18/22 0859 07/19/22 0350 07/20/22 0200 07/21/22 0510  NA 135 135 134* 134* 135  K 3.8 3.8 4.0 4.1 4.1  CL 101 105 103 103 105  CO2 21* 21* 20* 20* 20*  GLUCOSE 105* 102* 89 112* 98  BUN 24* 20 16 17 13   CREATININE 1.42* 1.12* 1.07* 1.19* 1.05*  CALCIUM 9.6 9.1 9.5 9.0 9.1    GFR: Estimated Creatinine Clearance: 33 mL/min (A) (by C-G formula based on  SCr of 1.05 mg/dL (H)). Liver Function Tests: Recent Labs  Lab 07/18/22 0859  AST 17  ALT 13  ALKPHOS 183*  BILITOT 0.8  PROT 6.5  ALBUMIN 3.6     Coagulation Profile: Recent Labs  Lab 07/18/22 0859  INR 1.0    Code Status:  DNR.  Code status decision has been confirmed with: patient Family Communication: daughter updated at bedside     Severity of Illness:  The appropriate patient status for this patient is INPATIENT. Inpatient status is judged to be reasonable and necessary in order to provide the required intensity of  service to ensure the patient's safety. The patient's presenting symptoms, physical exam findings, and initial radiographic and laboratory data in the context of their chronic comorbidities is felt to place them at high risk for further clinical deterioration. Furthermore, it is not anticipated that the patient will be medically stable for discharge from the hospital within 2 midnights of admission.   * I certify that at the point of admission it is my clinical judgment that the patient will require inpatient hospital care spanning beyond 2 midnights from the point of admission due to high intensity of service, high risk for further deterioration and high frequency of surveillance required.*  Author:  Rolm Gala MD  07/21/2022 9:22 AM

## 2022-07-21 NOTE — TOC Progression Note (Signed)
Transition of Care [facility]) - Progression Note    Patient Details  Name: Amy [name] MRN: [id number] Date of Birth: 09-02-1935  Transition [facility]) CM/SW Contact  Baldemar Lenis, [place] Phone Number: 07/21/2022, 10:14 AM  Clinical Narrative:   CSW spoke with patient to discuss recommendation to SNF. Patient in agreement, recently at [facility] and would like to return. CSW discussed with patient that she's likely in her copay days, and answered questions related to that. Patient will have to contact [employer] to check on status of copay amounts and where she is in terms of meeting her deductible or out of pocket maximum. CSW completed referral and sent to [facility], but unable to reach admissions until tomorrow to check on bed availability.     Expected Discharge Plan: Skilled Nursing Facility Barriers to Discharge: Continued Medical Work up  Expected Discharge Plan and Services   Discharge Planning Services: CM Consult   Living arrangements for the past 2 months: Single Family Home                                       Social Determinants of Health (SDOH) Interventions SDOH Screenings   Food Insecurity: No Food Insecurity (07/18/2022)  Housing: Low Risk  (07/18/2022)  Transportation Needs: No Transportation Needs (07/18/2022)  Utilities: Not At Risk (07/18/2022)  Alcohol Screen: Low Risk  (06/26/2021)  Depression (PHQ2-9): High Risk (07/17/2022)  Financial Resource Strain: Low Risk  (06/26/2021)  Physical Activity: Sufficiently Active (06/26/2021)  Social Connections: Socially Integrated (06/26/2021)  Stress: No Stress Concern Present (06/26/2021)  Tobacco Use: Low Risk  (07/19/2022)    Readmission Risk Interventions     No data to display

## 2022-07-22 DIAGNOSIS — R5381 Other malaise: Secondary | ICD-10-CM | POA: Diagnosis not present

## 2022-07-22 DIAGNOSIS — M7918 Myalgia, other site: Secondary | ICD-10-CM | POA: Diagnosis not present

## 2022-07-22 DIAGNOSIS — F339 Major depressive disorder, recurrent, unspecified: Secondary | ICD-10-CM | POA: Insufficient documentation

## 2022-07-22 DIAGNOSIS — S32811S Multiple fractures of pelvis with unstable disruption of pelvic ring, sequela: Secondary | ICD-10-CM | POA: Diagnosis not present

## 2022-07-22 DIAGNOSIS — E119 Type 2 diabetes mellitus without complications: Secondary | ICD-10-CM | POA: Diagnosis not present

## 2022-07-22 DIAGNOSIS — M6281 Muscle weakness (generalized): Secondary | ICD-10-CM | POA: Diagnosis not present

## 2022-07-22 DIAGNOSIS — S329XXD Fracture of unspecified parts of lumbosacral spine and pelvis, subsequent encounter for fracture with routine healing: Secondary | ICD-10-CM | POA: Diagnosis not present

## 2022-07-22 DIAGNOSIS — S3289XA Fracture of other parts of pelvis, initial encounter for closed fracture: Secondary | ICD-10-CM | POA: Diagnosis not present

## 2022-07-22 DIAGNOSIS — Z7901 Long term (current) use of anticoagulants: Secondary | ICD-10-CM | POA: Diagnosis not present

## 2022-07-22 DIAGNOSIS — S32110A Nondisplaced Zone I fracture of sacrum, initial encounter for closed fracture: Secondary | ICD-10-CM | POA: Diagnosis not present

## 2022-07-22 DIAGNOSIS — M791 Myalgia, unspecified site: Secondary | ICD-10-CM | POA: Insufficient documentation

## 2022-07-22 DIAGNOSIS — Z743 Need for continuous supervision: Secondary | ICD-10-CM | POA: Diagnosis not present

## 2022-07-22 DIAGNOSIS — R279 Unspecified lack of coordination: Secondary | ICD-10-CM | POA: Diagnosis not present

## 2022-07-22 DIAGNOSIS — S32511A Fracture of superior rim of right pubis, initial encounter for closed fracture: Secondary | ICD-10-CM | POA: Diagnosis not present

## 2022-07-22 DIAGNOSIS — R52 Pain, unspecified: Secondary | ICD-10-CM

## 2022-07-22 DIAGNOSIS — I129 Hypertensive chronic kidney disease with stage 1 through stage 4 chronic kidney disease, or unspecified chronic kidney disease: Secondary | ICD-10-CM | POA: Insufficient documentation

## 2022-07-22 DIAGNOSIS — E43 Unspecified severe protein-calorie malnutrition: Secondary | ICD-10-CM | POA: Diagnosis not present

## 2022-07-22 DIAGNOSIS — E559 Vitamin D deficiency, unspecified: Secondary | ICD-10-CM | POA: Diagnosis not present

## 2022-07-22 DIAGNOSIS — S72002A Fracture of unspecified part of neck of left femur, initial encounter for closed fracture: Secondary | ICD-10-CM | POA: Diagnosis not present

## 2022-07-22 DIAGNOSIS — R1319 Other dysphagia: Secondary | ICD-10-CM | POA: Diagnosis not present

## 2022-07-22 DIAGNOSIS — S3210XD Unspecified fracture of sacrum, subsequent encounter for fracture with routine healing: Secondary | ICD-10-CM | POA: Diagnosis not present

## 2022-07-22 LAB — CBC
HCT: 27.4 % — ABNORMAL LOW (ref 36.0–46.0)
Hemoglobin: 8.6 g/dL — ABNORMAL LOW (ref 12.0–15.0)
MCH: 32.2 pg (ref 26.0–34.0)
MCHC: 31.4 g/dL (ref 30.0–36.0)
MCV: 102.6 fL — ABNORMAL HIGH (ref 80.0–100.0)
Platelets: 286 10*3/uL (ref 150–400)
RBC: 2.67 MIL/uL — ABNORMAL LOW (ref 3.87–5.11)
RDW: 14.3 % (ref 11.5–15.5)
WBC: 5.5 10*3/uL (ref 4.0–10.5)
nRBC: 0 % (ref 0.0–0.2)

## 2022-07-22 LAB — BASIC METABOLIC PANEL
Anion gap: 7 (ref 5–15)
BUN: 13 mg/dL (ref 8–23)
CO2: 22 mmol/L (ref 22–32)
Calcium: 8.8 mg/dL — ABNORMAL LOW (ref 8.9–10.3)
Chloride: 105 mmol/L (ref 98–111)
Creatinine, Ser: 0.99 mg/dL (ref 0.44–1.00)
GFR, Estimated: 56 mL/min — ABNORMAL LOW (ref >60)
Glucose, Bld: 93 mg/dL (ref 70–99)
Potassium: 4.3 mmol/L (ref 3.5–5.1)
Sodium: 134 mmol/L — ABNORMAL LOW (ref 135–145)

## 2022-07-22 MED ORDER — TRAMADOL HCL 50 MG PO TABS
50.0000 mg | ORAL_TABLET | Freq: Four times a day (QID) | ORAL | Status: DC | PRN
  Administered 2022-07-22: 50 mg via ORAL
  Filled 2022-07-22: qty 1

## 2022-07-22 MED ORDER — METHOCARBAMOL 500 MG PO TABS
500.0000 mg | ORAL_TABLET | Freq: Three times a day (TID) | ORAL | Status: DC
  Administered 2022-07-22: 500 mg via ORAL
  Filled 2022-07-22: qty 1

## 2022-07-22 MED ORDER — TRAMADOL HCL 50 MG PO TABS: 50.0000 mg | ORAL_TABLET | Freq: Three times a day (TID) | ORAL | 0 refills | Status: DC | PRN

## 2022-07-22 MED ORDER — RIVAROXABAN 15 MG PO TABS: 15.0000 mg | ORAL_TABLET | Freq: Every day | ORAL | Status: DC

## 2022-07-22 MED ORDER — OXYCODONE-ACETAMINOPHEN 5-325 MG PO TABS: 1.0000 | ORAL_TABLET | ORAL | 0 refills | Status: DC | PRN

## 2022-07-22 MED ORDER — ACETAMINOPHEN 500 MG PO TABS
1000.0000 mg | ORAL_TABLET | Freq: Four times a day (QID) | ORAL | Status: DC
  Administered 2022-07-22 (×2): 1000 mg via ORAL
  Filled 2022-07-22 (×2): qty 2

## 2022-07-22 MED ORDER — ACETAMINOPHEN 500 MG PO TABS: 1000.0000 mg | ORAL_TABLET | Freq: Four times a day (QID) | ORAL | 0 refills | Status: AC

## 2022-07-22 MED ORDER — POLYETHYLENE GLYCOL 3350 17 G PO PACK: 17.0000 g | PACK | Freq: Two times a day (BID) | ORAL | 0 refills | Status: AC

## 2022-07-22 NOTE — TOC Progression Note (Addendum)
Transition of Care [facility]) - Progression Note    Patient Details  Name: Amy [name] MRN: [id number] Date of Birth: 1935-05-04  Transition of Care [facility]) CM/SW Contact  Lorri Frederick, LCSW Phone Number: 07/22/2022, 10:19 AM  Clinical Narrative:   CSW spoke to [facility] regarding referral.  She will review for possible bed offer.   1050: [facility] does offer bed.  CSW informed pt and daughter [name].  Daughter is requesting response from [facility] and [address]  CSW reached out to those facilities.  Also discussed copay days, pt asking if LTC insurance would cover copays--CSW unsure, daughter will contact LTC insurance to see.     1120: SNF auth request submitted with facility choice pending.   1250: [facility] offers bed, per [facility], pt would only have $50 per day in copays.  [facility] cannot offer bed.  Daughter [name] updated and wants to accept offer at [facility].   [facility] informed.  [employer] portal updated with SNF choice.   1430: Auth approved: [id number], [id number], 3 days: 7/1-7/3.  [facility] confirms they can take pt today. MD informed.   Expected Discharge Plan: Skilled Nursing Facility Barriers to Discharge: Continued Medical Work up  Expected Discharge Plan and Services   Discharge Planning Services: CM Consult   Living arrangements for the past 2 months: Single Family Home                                       Social Determinants of Health (SDOH) Interventions SDOH Screenings   Food Insecurity: No Food Insecurity (07/18/2022)  Housing: Low Risk  (07/18/2022)  Transportation Needs: No Transportation Needs (07/18/2022)  Utilities: Not At Risk (07/18/2022)  Alcohol Screen: Low Risk  (06/26/2021)  Depression (PHQ2-9): High Risk (07/17/2022)  Financial Resource Strain: Low Risk  (06/26/2021)  Physical Activity: Sufficiently Active (06/26/2021)  Social Connections: Socially Integrated (06/26/2021)  Stress: No Stress Concern  Present (06/26/2021)  Tobacco Use: Low Risk  (07/19/2022)    Readmission Risk Interventions     No data to display

## 2022-07-22 NOTE — Discharge Summary (Signed)
Physician Discharge Summary  Amy [name]:[id number] DOB: 1935-02-25 DOA: 07/17/2022  PCP: Pincus Sanes, MD  Admit date: 07/17/2022 Discharge date: 07/22/2022  Admitted From: home Discharge disposition: SNF   Recommendations for Outpatient Follow-Up:   Cbc, bmp  1 week Bowel regimen while on pain meds   Discharge Diagnosis:   Principal Problem:   Pelvic fracture (HCC) Active Problems:   Essential hypertension   Atrial fibrillation (HCC)   Acquired hypothyroidism   Anxiety and depression   B12 deficiency   Chronic anticoagulation   Benign hypertension with stage 3a chronic kidney disease (HCC)   Protein-calorie malnutrition, severe    Discharge Condition: Improved.  Diet recommendation: Low sodium, heart healthy.    Wound care: None.  Code status: DNR   History of Present Illness:   87 year old female with past medical history of HTN, atrial fibrillation/flutter (sp ablation, maintained on Tambocor and Xarelto), hypothyroidism, T2DM w/ nephropathy, and CKD IIIb. Her last dose of Xarelto was 06/14/2022. On Sunday 06/16/2022 patient tripped on a carpet while leaving church. The, fall resulted in pelvic bone fracture. CT readings Nondisplaced fracture of the right superior pubic ramus. Nondisplaced fracture of the right sacral ala. Patient was recently discharged to [facility] rehab where she spent the last month. Patient discharged home on 6/22. At the time of discharge patient's pain was minimal. Since being home developed in her left hip and has progressively worsened. Imaging done on the ER imaging shows worsening of her pelvis ramus fracture plus new fractures. Patient admitted to hospital for orthopedic surgery.    Hospital Course by Problem:   Right sacral fracture -s/p sacral fracture repair  -Pain control -Continue Xarelto as DVT prophylaxsis -PT/OT- SNF   Acute mood disorder Feeling better in terms of mood   Hypothyroidism -Continue  Synthroid   Atrial fibrillation  -Continue Xarelto (if high fall risk may need xarelto d/c;d) -Continue flecainide   Essential hypertension -continue home meds   Normocytic anemia -monitor Hgb outpatient   Stage 3a CKD  Cr 1.05 stable at baseline     Medical Consultants:    ortho  Discharge Exam:   Vitals:   07/22/22 0409 07/22/22 0733  BP:  121/69  Pulse:  85  Resp: 17 16  Temp: 97.8 F (36.6 C) 97.8 F (36.6 C)  SpO2: 98% 99%   Vitals:   07/21/22 2002 07/22/22 0000 07/22/22 0409 07/22/22 0733  BP: 125/78 (!) 109/54  121/69  Pulse: 92 96  85  Resp: 17 (!) 21 17 16   Temp: 97.6 F (36.4 C) 97.6 F (36.4 C) 97.8 F (36.6 C) 97.8 F (36.6 C)  TempSrc: Oral Oral Oral   SpO2: 97%  98% 99%  Weight:      Height:        General exam: Appears calm and comfortable.    The results of significant diagnostics from this hospitalization (including imaging, microbiology, ancillary and laboratory) are listed below for reference.     Procedures and Diagnostic Studies:   CT PELVIS WO CONTRAST  Result Date: 07/17/2022 CLINICAL DATA:  Pelvic fracture, progressive pelvic pain EXAM: CT PELVIS WITHOUT CONTRAST TECHNIQUE: Multidetector CT imaging of the pelvis was performed following the standard protocol without intravenous contrast. RADIATION DOSE REDUCTION: This exam was performed according to the departmental dose-optimization program which includes automated exposure control, adjustment of the mA and/or kV according to patient size and/or use of iterative reconstruction technique. COMPARISON:  06/16/2022 FINDINGS: Urinary Tract:  No  abnormality visualized. Bowel: Extensive sigmoid diverticulosis without superimposed acute inflammatory change. Moderate stool within the rectal vault. Visualized large and small bowel are otherwise unremarkable. Appendix normal. No free intraperitoneal fluid. Vascular/Lymphatic: Mild atherosclerotic calcification within the aortoiliac  vasculature. No aneurysm. No pathologic adenopathy within the abdomen and pelvis. Reproductive:  Pelvic organs are unremarkable. Other:  No abdominal wall hernia. Musculoskeletal: The osseous structures are diffusely osteopenic. Since the prior examination, there has developed resorption along the vertical fracture plane of the right sacral ala as well as report or shin and increasing fragmentation involving the right superior pubic ramus fracture with increasing displacement of the fracture fragments suggesting motion at this level. There is no significant bridging callus identified at either location. Additionally, there has developed a mildly displaced of the right iliac spine as well as a segmental fracture of the right inferior pubic ramus with mild anteroinferior displacement. A minimally displaced sagittally oriented fracture of the left sacral ala has developed in the interval. No dislocation. There is progressive soft tissue swelling surrounding the right superior pubic ramus fracture which may relate to local edema or hemorrhage. IMPRESSION: 1. Interval development of resorption along the vertical fracture plane of the right sacral ala as well as increasing fragmentation involving the right superior pubic ramus fracture with increasing displacement of the fracture fragments suggesting motion at this level. No significant bridging callus identified at either location. Progressive soft tissue swelling surrounding the superior pubic ramus fracture. 2. Interval development of a mildly displaced of the right iliac spine as well as a segmental fracture of the right inferior pubic ramus with mild anteroinferior displacement. This was not present on prior examination and suggests recurrent injury. 3. Interval development of a minimally displaced sagittally oriented fracture of the left sacral ala. 4. Extensive sigmoid diverticulosis without superimposed acute inflammatory change. 5. Aortic atherosclerosis. Aortic  Atherosclerosis (ICD10-I70.0). Electronically Signed   By: Helyn Numbers M.D.   On: 07/17/2022 21:09   DG Sacrum/Coccyx  Result Date: 07/17/2022 CLINICAL DATA:  Right superior pubic ramus fracture, left buttock pain EXAM: SACRUM AND COCCYX - 2+ VIEW COMPARISON:  06/16/2022, 06/19/2022 FINDINGS: Pelvic inlet, pelvic outlet, and lateral views of the sacrum and coccyx are obtained. The right superior ramus fracture seen on prior CT is now minimally displaced, over previously it was nondisplaced in anatomic alignment. No significant callus formation. The right sacral ala fracture seen on prior CT is not well visualized by x-ray, with the sacrum obscured by overlying bowel gas and stool. Sacroiliac joints are unremarkable. Stable degenerative changes in the lower lumbar spine. IMPRESSION: 1. Minimal displacement of the right superior pubic ramus fracture seen previously. No significant callus formation. 2. The right sacral ala fracture on prior exam is radiographically occult. Electronically Signed   By: Sharlet Salina M.D.   On: 07/17/2022 14:51   DG HIPS BILAT WITH PELVIS MIN 5 VIEWS  Result Date: 07/17/2022 CLINICAL DATA:  Recent nondisplaced fracture of the right superior pubic ramus. EXAM: DG HIP (WITH OR WITHOUT PELVIS) 5+V BILAT COMPARISON:  CT of the pelvis Jun 16, 2022 FINDINGS: There is severely displaced fracture of the right superior inferior pubic ramus. Fracture line extends to the right acetabulum. Moderate stool burden. IMPRESSION: Severely displaced fracture of the right superior and inferior pubic ramus, with extension to the right acetabulum. Electronically Signed   By: Ted Mcalpine M.D.   On: 07/17/2022 14:50     Labs:   Basic Metabolic Panel: Recent Labs  Lab 07/18/22 0859 07/19/22  0350 07/20/22 0200 07/21/22 0510 07/22/22 0454  NA 135 134* 134* 135 134*  K 3.8 4.0 4.1 4.1 4.3  CL 105 103 103 105 105  CO2 21* 20* 20* 20* 22  GLUCOSE 102* 89 112* 98 93  BUN 20 16 17  13 13   CREATININE 1.12* 1.07* 1.19* 1.05* 0.99  CALCIUM 9.1 9.5 9.0 9.1 8.8*   GFR Estimated Creatinine Clearance: 35 mL/min (by C-G formula based on SCr of 0.99 mg/dL). Liver Function Tests: Recent Labs  Lab 07/18/22 0859  AST 17  ALT 13  ALKPHOS 183*  BILITOT 0.8  PROT 6.5  ALBUMIN 3.6   No results for input(s): "LIPASE", "AMYLASE" in the last 168 hours. No results for input(s): "AMMONIA" in the last 168 hours. Coagulation profile Recent Labs  Lab 07/18/22 0859  INR 1.0    CBC: Recent Labs  Lab 07/17/22 2235 07/18/22 0859 07/19/22 0350 07/20/22 0200 07/21/22 0510 07/22/22 0454  WBC 8.1 6.7 6.6 8.1 8.9 5.5  NEUTROABS 5.8 5.1  --   --   --   --   HGB 10.7* 10.7* 11.0* 9.4* 9.3* 8.6*  HCT 33.5* 33.5* 34.0* 29.8* 28.5* 27.4*  MCV 101.5* 100.9* 99.7 100.7* 101.8* 102.6*  PLT 358 341 369 321 288 286   Cardiac Enzymes: No results for input(s): "CKTOTAL", "CKMB", "CKMBINDEX", "TROPONINI" in the last 168 hours. BNP: Invalid input(s): "POCBNP" CBG: No results for input(s): "GLUCAP" in the last 168 hours. D-Dimer No results for input(s): "DDIMER" in the last 72 hours. Hgb A1c No results for input(s): "HGBA1C" in the last 72 hours. Lipid Profile No results for input(s): "CHOL", "HDL", "LDLCALC", "TRIG", "CHOLHDL", "LDLDIRECT" in the last 72 hours. Thyroid function studies No results for input(s): "TSH", "T4TOTAL", "T3FREE", "THYROIDAB" in the last 72 hours.  Invalid input(s): "FREET3" Anemia work up Recent Labs    07/21/22 0939  VITAMINB12 1,248*  FOLATE 11.3   Microbiology No results found for this or any previous visit (from the past 240 hour(s)).   Discharge Instructions:   Discharge Instructions     Diet general   Complete by: As directed    Increase activity slowly   Complete by: As directed    No wound care   Complete by: As directed       Allergies as of 07/22/2022   No Known Allergies      Medication List     STOP taking these  medications    acetaminophen 650 MG CR tablet Commonly known as: TYLENOL Replaced by: acetaminophen 500 MG tablet   AMBULATORY NON FORMULARY MEDICATION       TAKE these medications    acetaminophen 500 MG tablet Commonly known as: TYLENOL Take 2 tablets (1,000 mg total) by mouth every 6 (six) hours. Replaces: acetaminophen 650 MG CR tablet   amLODipine 10 MG tablet Commonly known as: NORVASC Take 1 tablet (10 mg total) by mouth daily.   atenolol 50 MG tablet Commonly known as: TENORMIN Take 1 tablet (50 mg total) by mouth daily.   CALCIUM + D PO Take 1 tablet by mouth daily.   cloNIDine 0.1 MG tablet Commonly known as: CATAPRES Take 1 tablet (0.1 mg total) by mouth 3 (three) times daily.   flecainide 50 MG tablet Commonly known as: TAMBOCOR Take 1 tablet (50 mg total) by mouth 2 (two) times daily.   hydrALAZINE 100 MG tablet Commonly known as: APRESOLINE Take 1 tablet (100 mg total) by mouth 3 (three) times daily.   latanoprost 0.005 %  ophthalmic solution Commonly known as: XALATAN Place 1 drop into both eyes at bedtime.   levothyroxine 50 MCG tablet Commonly known as: SYNTHROID Take 1 tablet (50 mcg total) by mouth daily.   linaclotide 72 MCG capsule Commonly known as: Linzess Take 1 capsule (72 mcg total) by mouth as needed (for constipation).   losartan 100 MG tablet Commonly known as: COZAAR Take 1 tablet (100 mg total) by mouth daily.   melatonin 5 MG Tabs Take 5 mg by mouth at bedtime.   methocarbamol 500 MG tablet Commonly known as: ROBAXIN Take 1 tablet (500 mg total) by mouth 3 (three) times daily.   oxyCODONE-acetaminophen 5-325 MG tablet Commonly known as: PERCOCET/ROXICET Take 1 tablet by mouth every 4 (four) hours as needed for severe pain. What changed: how much to take   polyethylene glycol 17 g packet Commonly known as: MIRALAX / GLYCOLAX Take 17 g by mouth 2 (two) times daily.   Rivaroxaban 15 MG Tabs tablet Commonly known as:  XARELTO Take 1 tablet (15 mg total) by mouth daily with supper.   traMADol 50 MG tablet Commonly known as: ULTRAM Take 1 tablet (50 mg total) by mouth every 8 (eight) hours as needed for moderate pain. What changed: reasons to take this        Contact information for follow-up providers     Burns, Bobette Mo, MD Follow up.   Specialty: Internal Medicine Contact information: [address] [place] [address] [phone]              Contact information for after-discharge care     Destination     [facility] .   Service: Skilled Nursing Contact information: [address] [place] [address] [phone]                      Time coordinating discharge: 39  Signed:  Joseph Art DO  [facility] 07/22/2022, 2:50 PM

## 2022-07-22 NOTE — Plan of Care (Signed)

## 2022-07-22 NOTE — Consult Note (Signed)
   [facility] CM Inpatient Consult   07/22/2022  Amy [name] 07/19/1935 [id number]  [facility] [[facility]]  Accountable Care Organization [ACO] Patient: [name]  Primary Care Provider:  Pincus Sanes, MD with [place] [facility] which is listed to provide the transition of care follow up   Patient was reviewed for less than 30 days readmission for barriers to post hospital community care  Patient was screened for hospitalization and on behalf of [facility] Care Coordination to assess for post hospital community care needs.  Patient is being considered for a skilled nursing facility level of care for post hospital transition.  Plan:   Patient transitioned to a skilled nursing facility for rehab as need for post hospital care coordination is to be with SNF.  For questions or referrals, please contact:   Charlesetta Shanks, RN BSN CCM [facility]  [phone] business mobile phone Toll free office [phone]  *Concierge Line  [phone] Fax number: [phone] [email or web address]

## 2022-07-22 NOTE — TOC Transition Note (Signed)
Transition of Care [facility]) - CM/SW Discharge Note   Patient Details  Name: Amy [name] MRN: [id number] Date of Birth: 04-14-1935  Transition of Care [facility]) CM/SW Contact:  Lorri Frederick, LCSW Phone Number: 07/22/2022, 3:04 PM   Clinical Narrative:   Pt discharging to [facility].  RN call report to [phone].      Final next level of care: Skilled Nursing Facility Barriers to Discharge: Barriers Resolved   Patient Goals and CMS Choice   Choice offered to / list presented to : Spouse  Discharge Placement                Patient chooses bed at: [facility] Patient to be transferred to facility by: [facility] Name of family member notified: daughter [name] in room Patient and family notified of of transfer: 07/22/22  Discharge Plan and Services Additional resources added to the After Visit Summary for     Discharge Planning Services: CM Consult                                 Social Determinants of Health (SDOH) Interventions SDOH Screenings   Food Insecurity: No Food Insecurity (07/18/2022)  Housing: Low Risk  (07/18/2022)  Transportation Needs: No Transportation Needs (07/18/2022)  Utilities: Not At Risk (07/18/2022)  Alcohol Screen: Low Risk  (06/26/2021)  Depression (PHQ2-9): High Risk (07/17/2022)  Financial Resource Strain: Low Risk  (06/26/2021)  Physical Activity: Sufficiently Active (06/26/2021)  Social Connections: Socially Integrated (06/26/2021)  Stress: No Stress Concern Present (06/26/2021)  Tobacco Use: Low Risk  (07/19/2022)     Readmission Risk Interventions     No data to display

## 2022-07-22 NOTE — Progress Notes (Signed)
Occupational Therapy Treatment Patient Details Name: Amy [name] MRN: [id number] DOB: 25-Jul-1935 Today's Date: 07/22/2022   History of present illness 87 year old female admitted 6/26 with hip pain, found worsening of her pelvis ramus fracture plus new fractures. PTA, 06/16/2022 had fall with pelvic fx. 6/28 S/p x2-Percutaneous fixation of bilateral sacral insufficiency fractures.  PMHx: HTN, atrial fibrillation/flutter (sp ablation, maintained on Tambocor and Xarelto), hypothyroidism, T2DM  w/ nephropathy, and CKD IIIb.   OT comments  Patient completed supine to sitting EOB with min A, sit to stand with min guard and patient completed functional mobility using RW to bathroom and completed UB grooming task at sink with 1 UE support on sink counter for increased balance during grooming task.  Patient completed functional mobility back to bed and completed sit to supine with min A to assist lifting Les back into bed 2/2 patient not wanting to be left sitting in bedside chair when therapy left room.  Patient educated on importance of getting out of bed and remaining OOB to increased upright sitting tolerance and overall strength.  Patient verbalizes understanding. Patient would benefit from additional OT intervention to address functional deficits in order for patient to return home to PLOF.   Recommendations for follow up therapy are one component of a multi-disciplinary discharge planning process, led by the attending physician.  Recommendations may be updated based on patient status, additional functional criteria and insurance authorization.    Assistance Recommended at Discharge Frequent or constant Supervision/Assistance  Patient can return home with the following  A little help with walking and/or transfers;A lot of help with bathing/dressing/bathroom;Assistance with cooking/housework;Assist for transportation;Help with stairs or ramp for entrance   Equipment Recommendations  None recommended by  OT    Recommendations for Other Services      Precautions / Restrictions Precautions Precautions: Fall Precaution Comments: Watch HR Restrictions Weight Bearing Restrictions: Yes RUE Weight Bearing: Weight bearing as tolerated LUE Weight Bearing: Weight bearing as tolerated RLE Weight Bearing: Weight bearing as tolerated LLE Weight Bearing: Weight bearing as tolerated       Mobility Bed Mobility Overal bed mobility: Needs Assistance Bed Mobility: Supine to Sit     Supine to sit: Min assist Sit to supine: Min assist        Transfers Overall transfer level: Needs assistance Equipment used: Rolling walker (2 wheels) Transfers: Sit to/from Stand Sit to Stand: Min guard     Step pivot transfers: Min guard           Balance Overall balance assessment: Needs assistance Sitting-balance support: No upper extremity supported, Feet supported       Standing balance support: Bilateral upper extremity supported, Reliant on assistive device for balance                               ADL either performed or assessed with clinical judgement   ADL Overall ADL's : Needs assistance/impaired Eating/Feeding: Independent;Sitting   Grooming: Standing;Min guard                   Toilet Transfer: Minimal assistance;Cueing for safety   Toileting- Clothing Manipulation and Hygiene: Moderate assistance       Functional mobility during ADLs: Min guard;Rolling walker (2 wheels)      Extremity/Trunk Assessment              Vision       Perception     Praxis  Cognition Arousal/Alertness: Awake/alert Behavior During Therapy: WFL for tasks assessed/performed, Flat affect Overall Cognitive Status: Within Functional Limits for tasks assessed                                          Exercises      Shoulder Instructions       General Comments HR 80's bpm resting, 90's bpm with standing; no dizziness reported with  positional changes    Pertinent Vitals/ Pain       Pain Assessment Pain Assessment: 0-10 Pain Score: 8  Faces Pain Scale: Hurts whole lot Pain Descriptors / Indicators: Aching, Grimacing, Guarding Pain Intervention(s): Monitored during session  Home Living                                          Prior Functioning/Environment              Frequency  Min 2X/week        Progress Toward Goals  OT Goals(current goals can now be found in the care plan section)  Progress towards OT goals: Progressing toward goals  Acute Rehab OT Goals OT Goal Formulation: With patient Time For Goal Achievement: 08/03/22 Potential to Achieve Goals: Good ADL Goals Pt Will Perform Grooming: with supervision;standing Pt Will Perform Lower Body Bathing: with supervision;sit to/from stand Pt Will Perform Lower Body Dressing: sitting/lateral leans;with supervision Pt Will Transfer to Toilet: ambulating;with supervision  Plan Discharge plan remains appropriate    Co-evaluation                 AM-PAC OT "6 Clicks" Daily Activity     Outcome Measure   Help from another person eating meals?: None Help from another person taking care of personal grooming?: A Little Help from another person toileting, which includes using toliet, bedpan, or urinal?: A Little Help from another person bathing (including washing, rinsing, drying)?: A Lot Help from another person to put on and taking off regular upper body clothing?: None Help from another person to put on and taking off regular lower body clothing?: A Lot 6 Click Score: 18    End of Session Equipment Utilized During Treatment: Gait belt;Rolling walker (2 wheels)  OT Visit Diagnosis: Unsteadiness on feet (R26.81);Other abnormalities of gait and mobility (R26.89);History of falling (Z91.81);Pain;Muscle weakness (generalized) (M62.81) Pain - Right/Left: Right Pain - part of body: Hip   Activity Tolerance Patient  tolerated treatment well   Patient Left in bed;with call bell/phone within reach   Nurse Communication Mobility status        Time: 2956-2130 OT Time Calculation (min): 40 min  Charges: OT Treatments $Self Care/Home Management : 38-52 mins  Governor Specking OT/L Denice Paradise 07/22/2022, 4:05 PM

## 2022-07-22 NOTE — Progress Notes (Signed)
Orthopaedic Trauma Progress Note  SUBJECTIVE: Patient doing okay this morning, pain fairly well-controlled.  Does note improvement from pre-op.was able to sit up in the chair for about 2 hours yesterday but noted she started getting uncomfortable beyond this and returned to bed.  Has not done any other significant walking.  Still using the bedpan.  Denies any numbness or tingling throughout bilateral lower extremities.  No chest pain. No SOB. No nausea/vomiting. No other complaints.  Patient agreeable to SNF.  OBJECTIVE:  Vitals:   07/22/22 0409 07/22/22 0733  BP:  121/69  Pulse:  85  Resp: 17 16  Temp: 97.8 F (36.6 C) 97.8 F (36.6 C)  SpO2: 98% 99%    General: Sitting up in bed, no acute distress Respiratory: No increased work of breathing.  RLE/pelvis: Dressing removed from right posterior hip.  Incision is clean, dry, intact.  No significant tenderness with palpation over the posterior pelvis.  Ankle dorsiflexion and plantarflexion are intact bilaterally.  Toes warm and well-perfused.  + DP pulse extremity:   IMAGING: Stable post op imaging.   LABS:  Results for orders placed or performed during the hospital encounter of 07/17/22 (from the past 24 hour(s))  Vitamin B12     Status: Abnormal   Collection Time: 07/21/22  9:39 AM  Result Value Ref Range   Vitamin B-12 1,248 (H) 180 - 914 pg/mL  Folate     Status: None   Collection Time: 07/21/22  9:39 AM  Result Value Ref Range   Folate 11.3 >5.9 ng/mL  CBC     Status: Abnormal   Collection Time: 07/22/22  4:54 AM  Result Value Ref Range   WBC 5.5 4.0 - 10.5 K/uL   RBC 2.67 (L) 3.87 - 5.11 MIL/uL   Hemoglobin 8.6 (L) 12.0 - 15.0 g/dL   HCT 16.1 (L) 09.6 - 04.5 %   MCV 102.6 (H) 80.0 - 100.0 fL   MCH 32.2 26.0 - 34.0 pg   MCHC 31.4 30.0 - 36.0 g/dL   RDW 40.9 81.1 - 91.4 %   Platelets 286 150 - 400 K/uL   nRBC 0.0 0.0 - 0.2 %  Basic metabolic panel     Status: Abnormal   Collection Time: 07/22/22  4:54 AM  Result Value  Ref Range   Sodium 134 (L) 135 - 145 mmol/L   Potassium 4.3 3.5 - 5.1 mmol/L   Chloride 105 98 - 111 mmol/L   CO2 22 22 - 32 mmol/L   Glucose, Bld 93 70 - 99 mg/dL   BUN 13 8 - 23 mg/dL   Creatinine, Ser 7.82 0.44 - 1.00 mg/dL   Calcium 8.8 (L) 8.9 - 10.3 mg/dL   GFR, Estimated 56 (L) >60 mL/min   Anion gap 7 5 - 15    ASSESSMENT: Edgar A Beauman is a 87 y.o. female, 3 Days Post-Op s/p ORIF PELVIC FRACTURE WITH PERCUTANEOUS SCREWS  CV/Blood loss: Acute blood loss anemia, Hgb 8.6 this morning. Hemodynamically stable  PLAN: Weightbearing: WBAT RLE and LLE ROM: Okay for unrestricted ROM of hips Incisional and dressing care: Leave incision open to air Showering: Okay to begin showering and getting incisions wet Orthopedic device(s): None  Pain management: Continue multimodal pain regimen VTE prophylaxis: Xarelto, SCDs ID:  Ancef 2gm post op completed Foley/Lines:  No foley, KVO IVFs Impediments to Fracture Healing: Vitamin D level 39, no additional supplementation needed Dispo: PT/OT evaluation ongoing, patient agreeable to SNF.  TOC following for placement.  Okay for  discharge from ortho standpoint once cleared by medicine team and therapies  D/C recommendations: -Home dose Percocet for pain control -Home dose Xarelto for DVT prophylaxis -No additional need for Vit D supplementation  Follow - up plan: 2 weeks after discharge for wound check and repeat x-rays   Contact information:  Truitt Merle MD, Thyra Breed PA-C. After hours and holidays please check [email or web address] for group call information for [facility]   Thompson Caul, PA-C [phone] (office) [email or web address]

## 2022-07-22 NOTE — Anesthesia Postprocedure Evaluation (Signed)
Anesthesia Post Note  Patient: Amy [name]  Procedure(s) Performed: ORIF PELVIC FRACTURE WITH PERCUTANEOUS SCREWS     Patient location during evaluation: PACU Anesthesia Type: General Level of consciousness: sedated and patient cooperative Pain management: pain level controlled Vital Signs Assessment: post-procedure vital signs reviewed and stable Respiratory status: spontaneous breathing Cardiovascular status: stable Anesthetic complications: no   No notable events documented.  Last Vitals:  Vitals:   07/22/22 0000 07/22/22 0409  BP: (!) 109/54   Pulse: 96   Resp: (!) 21 17  Temp: 36.4 C 36.6 C  SpO2:  98%    Last Pain:  Vitals:   07/22/22 0409  TempSrc: Oral  PainSc: 8                  Lewie Loron

## 2022-07-22 NOTE — Progress Notes (Addendum)
Physical Therapy Treatment Patient Details Name: Amy [name] MRN: [id number] DOB: 06-11-1935 Today's Date: 07/22/2022   History of Present Illness 87 year old female admitted 6/26 with hip pain, found worsening of her pelvis ramus fracture plus new fractures. PTA, 06/16/2022 had fall with pelvic fx. 6/28 S/p x2-Percutaneous fixation of bilateral sacral insufficiency fractures.  PMHx: HTN, atrial fibrillation/flutter (sp ablation, maintained on Tambocor and Xarelto), hypothyroidism, T2DM  w/ nephropathy, and CKD IIIb.    PT Comments  Pt received in supine, agreeable to therapy session with encouragement after premedication for pain. Pt anxious in anticipation of pain and needing up to minA (with mod safety cues and +2 for safety with chair follow) to perform household distance gait trial with increased time today. Pt needing less assist to sit up toward R edge of bed using rail/hospital bed features. Pt c/o moderate to severe L hip/posterior pelvic pain, pt instructed on cryotherapy, use of Incentive Spirometer and pressure relief strategies and positional changes every 20-30 mins while in chair to offload pressure. Daughter present and encouraging. Pt continues to benefit from PT services to progress toward functional mobility goals. PT freq updated below per discussion with supervising PT Logan B and plan for SNF.    Assistance Recommended at Discharge Intermittent Supervision/Assistance  If plan is discharge home, recommend the following:  Can travel by private vehicle    A little help with bathing/dressing/bathroom;Assistance with cooking/housework;Assist for transportation;A lot of help with walking and/or transfers (chair follow safety)   No  Equipment Recommendations  None recommended by PT    Recommendations for Other Services       Precautions / Restrictions Precautions Precautions: Fall Restrictions Weight Bearing Restrictions: Yes RLE Weight Bearing: Weight bearing as  tolerated LLE Weight Bearing: Weight bearing as tolerated     Mobility  Bed Mobility Overal bed mobility: Needs Assistance Bed Mobility: Rolling, Sidelying to Sit Rolling: Modified independent (Device/Increase time) Sidelying to sit: Supervision       General bed mobility comments: Increased time/effort to perform, pt needing hospital bed rail, sitting up to R side. Pt moves BLE to EOB without physical assist    Transfers Overall transfer level: Needs assistance Equipment used: Rolling walker (2 wheels) Transfers: Sit to/from Stand Sit to Stand: Min guard           General transfer comment: Cues for hand placement to and from seated surface.  EOB>RW and RW>chair with geomat cushion. Pt reclined in chair but lying toward her R side in recliner due to L hip pain. Encouraged her not to cross her legs while resting to promote neutral hip/pelvis posture for healing.    Ambulation/Gait Ambulation/Gait assistance: Min assist, +2 safety/equipment Gait Distance (Feet): 90 Feet (30 x3 with x2 standing breaks) Assistive device: Rolling walker (2 wheels) Gait Pattern/deviations: Step-to pattern, Trunk flexed, Antalgic, Narrow base of support, Decreased step length - right Gait velocity: decr     General Gait Details: Cues for RW placement and sequencing, chair follow for safety and pt needed chair pulled up to sit after 53ft. Pt defers further distance gait or stair training at that time due to acute pain. HR to 90's bpm per tele monitor   Stairs Stairs:  (defer, pain too severe)           Wheelchair Mobility     Tilt Bed    Modified Rankin (Stroke Patients Only)       Balance Overall balance assessment: Needs assistance Sitting-balance support: No upper extremity supported, Feet supported  Sitting balance-Leahy Scale: Good     Standing balance support: Bilateral upper extremity supported, Reliant on assistive device for balance Standing balance-Leahy Scale:  Poor Standing balance comment: moderate reliance on RW due to pain with dynamic tasks                            Cognition Arousal/Alertness: Awake/alert Behavior During Therapy: WFL for tasks assessed/performed, Flat affect Overall Cognitive Status: Within Functional Limits for tasks assessed                                          Exercises General Exercises - Lower Extremity Ankle Circles/Pumps: AROM, Both, 10 reps, Seated Quad Sets: AROM, Both, 5 reps, Supine Long Arc Quad: AROM, Both, 10 reps, Seated Heel Slides: AROM, Both, 10 reps, Supine Other Exercises Other Exercises: IS x10 reps, pt achieves ~700-900 mL at a time, mod cues for proper technique and hourly frequency    General Comments General comments (skin integrity, edema, etc.): HR 80's bpm resting, 90's bpm with standing; no dizziness reported with positional changes      Pertinent Vitals/Pain Pain Assessment Pain Assessment: 0-10 Pain Score: 7  Pain Location: L hip, worst sitting in recliner, moderate to severe with WB/gait Pain Descriptors / Indicators: Aching, Grimacing, Guarding, Sore Pain Intervention(s): Limited activity within patient's tolerance, Monitored during session, Premedicated before session, Repositioned, Ice applied ("I was good until you came in")     PT Goals (current goals can now be found in the care plan section) Acute Rehab PT Goals Patient Stated Goal: Less pain in my L hip and to get stronger so I can go home and help my husband. PT Goal Formulation: With patient Time For Goal Achievement: 08/01/22 Progress towards PT goals: Progressing toward goals    Frequency    Min 3X/week      PT Plan Current plan remains appropriate       AM-PAC PT "6 Clicks" Mobility   Outcome Measure  Help needed turning from your back to your side while in a flat bed without using bedrails?: None Help needed moving from lying on your back to sitting on the side of a  flat bed without using bedrails?: A Little Help needed moving to and from a bed to a chair (including a wheelchair)?: A Little Help needed standing up from a chair using your arms (e.g., wheelchair or bedside chair)?: A Little Help needed to walk in hospital room?: A Lot Help needed climbing 3-5 steps with a railing? : Total 6 Click Score: 16    End of Session Equipment Utilized During Treatment: Gait belt Activity Tolerance: Patient limited by pain;Patient tolerated treatment well Patient left: in chair;with call bell/phone within reach;with chair alarm set;with family/visitor present (daughter in room with her) Nurse Communication: Mobility status;Patient requests pain meds PT Visit Diagnosis: Other abnormalities of gait and mobility (R26.89);Muscle weakness (generalized) (M62.81);History of falling (Z91.81);Pain;Difficulty in walking, not elsewhere classified (R26.2) Pain - Right/Left: Left Pain - part of body: Hip     Time: 1345-1419 PT Time Calculation (min) (ACUTE ONLY): 34 min  Charges:    $Gait Training: 8-22 mins $Therapeutic Exercise: 8-22 mins PT General Charges $$ ACUTE PT VISIT: 1 Visit                     Eual Lindstrom P., PTA  Acute Rehabilitation Services Secure Chat Preferred 9a-5:30pm Office: [phone]    Dorathy Kinsman [facility] 07/22/2022, 3:03 PM

## 2022-07-22 NOTE — Progress Notes (Signed)
Report called to [facility]. ?

## 2022-07-22 NOTE — Care Management Important Message (Signed)
Important Message  Patient Details  Name: Amy [name] MRN: [id number] Date of Birth: November 27, 1935   Medicare Important Message Given:  Yes     Sherilyn Banker 07/22/2022, 1:49 PM

## 2022-07-22 NOTE — Progress Notes (Signed)
PROGRESS NOTE   MUNIBA CISNEY  ZOX:[id number] DOB: 11/29/1935 DOA: 07/17/2022 PCP: Pincus Sanes, MD   Date of Service: the patient was seen and examined on 07/22/2022  Brief Narrative:  This is a 87 year old female with past medical history of HTN, atrial fibrillation/flutter (sp ablation, maintained on Tambocor and Xarelto), hypothyroidism, T2DM  w/ nephropathy, and CKD IIIb.  Her last dose of Xarelto was 06/14/2022.  On Sunday 06/16/2022 patient tripped on a carpet while leaving church.  The, fall resulted in pelvic bone fracture.  CT readings Nondisplaced fracture of the right superior pubic ramus. Nondisplaced fracture of the right sacral ala. Patient was recently discharged to [facility] rehab where she spent the last month.  Patient discharged home on 6/22.  At the time of discharge patient's pain was minimal.  Since being home developed in her left hip and has progressively worsened.  Imaging done on the ER imaging shows worsening of her pelvis ramus fracture plus new fractures.  Patient admitted to hospital for orthopedic surgery.  Assessment and Plan:  Right sacral fracture -s/p sacral fracture repair  -Pain control -Continue Xarelto -PT/OT- SNF   Acute mood disorder Feeling better in terms of mood -still with pain  Hypothyroidism -Continue Synthroid   Atrial fibrillation  -Continue Xarelto -Continue flecainide   Essential hypertension -continue home meds  Normocytic anemia Hemoglobin 9.3 today, 9.4 yesterday. Baseline 9-10, MCV 100, ??megaloblastic due to nutritional deficiency -Monitor with CBC  -Check B12 and folate   Stage 3a CKD  Cr 1.05 stable at baseline -Continue to monitor with BMP  Subjective: Uncomfortable in the bed- would like to sit on the side  Physical Exam:  Vitals:   07/21/22 2002 07/22/22 0000 07/22/22 0409 07/22/22 0733  BP: 125/78 (!) 109/54  121/69  Pulse: 92 96  85  Resp: 17 (!) 21 17 16   Temp: 97.6 F (36.4 C) 97.6 F (36.4 C) 97.8  F (36.6 C) 97.8 F (36.6 C)  TempSrc: Oral Oral Oral   SpO2: 97%  98% 99%  Weight:      Height:         General: Appearance:    Thin female in no acute distress     Lungs:      respirations unlabored  Heart:    Normal heart rate.   MS:   All extremities are intact.   Neurologic:   Awake, alert     I have personally reviewed and interpreted labs, imaging.  CBC: Recent Labs  Lab 07/17/22 2235 07/18/22 0859 07/19/22 0350 07/20/22 0200 07/21/22 0510 07/22/22 0454  WBC 8.1 6.7 6.6 8.1 8.9 5.5  NEUTROABS 5.8 5.1  --   --   --   --   HGB 10.7* 10.7* 11.0* 9.4* 9.3* 8.6*  HCT 33.5* 33.5* 34.0* 29.8* 28.5* 27.4*  MCV 101.5* 100.9* 99.7 100.7* 101.8* 102.6*  PLT 358 341 369 321 288 286   Basic Metabolic Panel: Recent Labs  Lab 07/18/22 0859 07/19/22 0350 07/20/22 0200 07/21/22 0510 07/22/22 0454  NA 135 134* 134* 135 134*  K 3.8 4.0 4.1 4.1 4.3  CL 105 103 103 105 105  CO2 21* 20* 20* 20* 22  GLUCOSE 102* 89 112* 98 93  BUN 20 16 17 13 13   CREATININE 1.12* 1.07* 1.19* 1.05* 0.99  CALCIUM 9.1 9.5 9.0 9.1 8.8*   GFR: Estimated Creatinine Clearance: 35 mL/min (by C-G formula based on SCr of 0.99 mg/dL). Liver Function Tests: Recent Labs  Lab 07/18/22 [phone]  AST 17  ALT 13  ALKPHOS 183*  BILITOT 0.8  PROT 6.5  ALBUMIN 3.6    Coagulation Profile: Recent Labs  Lab 07/18/22 0859  INR 1.0   Code Status:  DNR.  Code status decision has been confirmed with: patient Family Communication: daughter updated at bedside     Joseph Art DO  07/22/2022 10:28 AM

## 2022-07-23 DIAGNOSIS — R5381 Other malaise: Secondary | ICD-10-CM | POA: Diagnosis not present

## 2022-07-23 DIAGNOSIS — M6281 Muscle weakness (generalized): Secondary | ICD-10-CM | POA: Insufficient documentation

## 2022-07-23 DIAGNOSIS — R279 Unspecified lack of coordination: Secondary | ICD-10-CM | POA: Insufficient documentation

## 2022-07-23 DIAGNOSIS — S72002A Fracture of unspecified part of neck of left femur, initial encounter for closed fracture: Secondary | ICD-10-CM | POA: Diagnosis not present

## 2022-07-23 DIAGNOSIS — R269 Unspecified abnormalities of gait and mobility: Secondary | ICD-10-CM | POA: Insufficient documentation

## 2022-07-23 DIAGNOSIS — H409 Unspecified glaucoma: Secondary | ICD-10-CM | POA: Insufficient documentation

## 2022-07-24 DIAGNOSIS — R1310 Dysphagia, unspecified: Secondary | ICD-10-CM | POA: Insufficient documentation

## 2022-08-05 ENCOUNTER — Ambulatory Visit (INDEPENDENT_AMBULATORY_CARE_PROVIDER_SITE_OTHER): Payer: Medicare PPO

## 2022-08-05 DIAGNOSIS — I4821 Permanent atrial fibrillation: Secondary | ICD-10-CM | POA: Diagnosis not present

## 2022-08-05 DIAGNOSIS — E039 Hypothyroidism, unspecified: Secondary | ICD-10-CM | POA: Diagnosis not present

## 2022-08-05 DIAGNOSIS — I4891 Unspecified atrial fibrillation: Secondary | ICD-10-CM

## 2022-08-05 DIAGNOSIS — D631 Anemia in chronic kidney disease: Secondary | ICD-10-CM | POA: Diagnosis not present

## 2022-08-05 DIAGNOSIS — N1832 Chronic kidney disease, stage 3b: Secondary | ICD-10-CM | POA: Diagnosis not present

## 2022-08-05 DIAGNOSIS — S32591D Other specified fracture of right pubis, subsequent encounter for fracture with routine healing: Secondary | ICD-10-CM | POA: Diagnosis not present

## 2022-08-05 DIAGNOSIS — S329XXD Fracture of unspecified parts of lumbosacral spine and pelvis, subsequent encounter for fracture with routine healing: Secondary | ICD-10-CM | POA: Diagnosis not present

## 2022-08-05 DIAGNOSIS — N1831 Chronic kidney disease, stage 3a: Secondary | ICD-10-CM | POA: Diagnosis not present

## 2022-08-05 DIAGNOSIS — F419 Anxiety disorder, unspecified: Secondary | ICD-10-CM | POA: Diagnosis not present

## 2022-08-05 DIAGNOSIS — I130 Hypertensive heart and chronic kidney disease with heart failure and stage 1 through stage 4 chronic kidney disease, or unspecified chronic kidney disease: Secondary | ICD-10-CM | POA: Diagnosis not present

## 2022-08-05 DIAGNOSIS — E43 Unspecified severe protein-calorie malnutrition: Secondary | ICD-10-CM | POA: Diagnosis not present

## 2022-08-05 DIAGNOSIS — D649 Anemia, unspecified: Secondary | ICD-10-CM | POA: Diagnosis not present

## 2022-08-05 DIAGNOSIS — E119 Type 2 diabetes mellitus without complications: Secondary | ICD-10-CM | POA: Diagnosis not present

## 2022-08-05 DIAGNOSIS — F339 Major depressive disorder, recurrent, unspecified: Secondary | ICD-10-CM | POA: Diagnosis not present

## 2022-08-05 DIAGNOSIS — I4892 Unspecified atrial flutter: Secondary | ICD-10-CM | POA: Diagnosis not present

## 2022-08-05 DIAGNOSIS — I10 Essential (primary) hypertension: Secondary | ICD-10-CM | POA: Diagnosis not present

## 2022-08-05 DIAGNOSIS — I509 Heart failure, unspecified: Secondary | ICD-10-CM | POA: Diagnosis not present

## 2022-08-06 LAB — CUP PACEART REMOTE DEVICE CHECK
Date Time Interrogation Session: 20240716080400
Implantable Pulse Generator Implant Date: 20240329

## 2022-08-07 ENCOUNTER — Ambulatory Visit: Payer: Medicare PPO | Admitting: Family Medicine

## 2022-08-08 ENCOUNTER — Telehealth: Payer: Self-pay

## 2022-08-08 NOTE — Telephone Encounter (Addendum)
Spoke with patient's husband regarding his wife's condition.  He stated that she recently fell and had surgery done.  She is now confined to the bed, no appetite and has not been taking her medication.  Husband is very concerned and would like some clarification regarding her medication and well-being at this state.

## 2022-08-09 ENCOUNTER — Telehealth: Payer: Self-pay | Admitting: Urgent Care

## 2022-08-09 MED ORDER — ALPRAZOLAM 0.25 MG PO TABS: 0.2500 mg | ORAL_TABLET | Freq: Two times a day (BID) | ORAL | 0 refills | Status: DC | PRN

## 2022-08-09 NOTE — Telephone Encounter (Signed)
Notified pt husband w/ Whitney, PA response.Marland KitchenRaechel Chute

## 2022-08-09 NOTE — Telephone Encounter (Signed)
Called in 20 tabs of 0.25mg  xanax to help with her severe anxiety s/p DC from rehab. Will see if this improves her oral intake. Side effects reviewed with pt/ spouse. Pt has had script for this in the past, in 2022 and again in Feb 2024. Encouraged no more than 1-2 tabs daily, sedation precaution discussed. Pt to head to ER if continued poor PO intake and no response to anxiety medication.

## 2022-08-12 DIAGNOSIS — S329XXD Fracture of unspecified parts of lumbosacral spine and pelvis, subsequent encounter for fracture with routine healing: Secondary | ICD-10-CM | POA: Diagnosis not present

## 2022-08-13 ENCOUNTER — Encounter: Payer: Self-pay | Admitting: Internal Medicine

## 2022-08-13 NOTE — Telephone Encounter (Addendum)
Pt and her daughter report that she has been mostly in bed since her Hop fracture surgery 07/19/22.. she has stopped her BP meds due to being over whelmed and just could not keep up with it at the time.   It has been about 2 weeks since she has taken:   Xarelto (she was only doing every other day at the time)  Hydralazine Losartan Clonidine Atenolol Amlodipine Flecainide  She says she is not mobile enough to come in for now but I made her an appt for 09/09/22.   She says her BP has been good 120/70 and HR has been in the 90's.   She is not SOB, no palps, no chest pain, only some dizziness with position changes.   She has not been eating and drinking well but it trying.   Her daughter is asking if she needs to start back any meds.... I advised her that I will talk with Dr Tenny Craw.

## 2022-08-13 NOTE — Telephone Encounter (Signed)
I spoke with the pts daughter and we moved the pts appt up to September 02, 2022 with Dr Tenny Craw... ot dies not have HH coming out but she has PT coming but only once a week.   She says her feet are always cold her last HGB was 07/22/22 and 8.6.Marland KitchenMarland Kitchen Dr Tenny Craw is planning to review her labs/ chart.

## 2022-08-13 NOTE — Telephone Encounter (Signed)
I called the pt back and per Dr Tenny Craw she will hold off of her heart meds for now until she has a chance to talk with her and review.... she took her BP after laying down for a bit and it was 130/80 and HR 101 this afternoon.   I asked her to keep a log for [place] and she says she will consider an apple watch or Lourena Simmonds but not sure she can get one any time soon.   She will monitor for heart fluttering and palps and if she has any problems she will let us know asap.

## 2022-08-13 NOTE — Telephone Encounter (Signed)
I would recomm starting Xarelto and Synthroid   I spoke with the patient and her husband  Pt has PT through [facility]    Could a [facility] nurse come by to draw a BMET and CBC?

## 2022-08-14 ENCOUNTER — Encounter: Payer: Self-pay | Admitting: Internal Medicine

## 2022-08-14 DIAGNOSIS — I480 Paroxysmal atrial fibrillation: Secondary | ICD-10-CM

## 2022-08-14 DIAGNOSIS — E1140 Type 2 diabetes mellitus with diabetic neuropathy, unspecified: Secondary | ICD-10-CM | POA: Diagnosis not present

## 2022-08-14 DIAGNOSIS — I509 Heart failure, unspecified: Secondary | ICD-10-CM | POA: Diagnosis not present

## 2022-08-14 DIAGNOSIS — D631 Anemia in chronic kidney disease: Secondary | ICD-10-CM | POA: Diagnosis not present

## 2022-08-14 DIAGNOSIS — S32591D Other specified fracture of right pubis, subsequent encounter for fracture with routine healing: Secondary | ICD-10-CM | POA: Diagnosis not present

## 2022-08-14 DIAGNOSIS — I10 Essential (primary) hypertension: Secondary | ICD-10-CM

## 2022-08-14 DIAGNOSIS — S32110D Nondisplaced Zone I fracture of sacrum, subsequent encounter for fracture with routine healing: Secondary | ICD-10-CM | POA: Diagnosis not present

## 2022-08-14 DIAGNOSIS — N1832 Chronic kidney disease, stage 3b: Secondary | ICD-10-CM | POA: Diagnosis not present

## 2022-08-14 DIAGNOSIS — I4821 Permanent atrial fibrillation: Secondary | ICD-10-CM | POA: Diagnosis not present

## 2022-08-14 DIAGNOSIS — E1122 Type 2 diabetes mellitus with diabetic chronic kidney disease: Secondary | ICD-10-CM | POA: Diagnosis not present

## 2022-08-14 DIAGNOSIS — I4891 Unspecified atrial fibrillation: Secondary | ICD-10-CM

## 2022-08-14 DIAGNOSIS — I130 Hypertensive heart and chronic kidney disease with heart failure and stage 1 through stage 4 chronic kidney disease, or unspecified chronic kidney disease: Secondary | ICD-10-CM | POA: Diagnosis not present

## 2022-08-15 MED ORDER — ATENOLOL 25 MG PO TABS: 25.0000 mg | ORAL_TABLET | Freq: Every day | ORAL | 1 refills | Status: DC

## 2022-08-15 NOTE — Addendum Note (Signed)
Addended by: Alois Cliche on: 08/15/2022 04:46 PM   Modules accepted: Orders

## 2022-08-15 NOTE — Telephone Encounter (Signed)
Amy [name] is working on getting blood work done With BP and HR I would recomm adding atenolol 25 mg     Keep on blood thinner

## 2022-08-16 DIAGNOSIS — D631 Anemia in chronic kidney disease: Secondary | ICD-10-CM | POA: Diagnosis not present

## 2022-08-16 DIAGNOSIS — E1122 Type 2 diabetes mellitus with diabetic chronic kidney disease: Secondary | ICD-10-CM | POA: Diagnosis not present

## 2022-08-16 DIAGNOSIS — S32110D Nondisplaced Zone I fracture of sacrum, subsequent encounter for fracture with routine healing: Secondary | ICD-10-CM | POA: Diagnosis not present

## 2022-08-16 DIAGNOSIS — S32591D Other specified fracture of right pubis, subsequent encounter for fracture with routine healing: Secondary | ICD-10-CM | POA: Diagnosis not present

## 2022-08-16 DIAGNOSIS — N1832 Chronic kidney disease, stage 3b: Secondary | ICD-10-CM | POA: Diagnosis not present

## 2022-08-16 DIAGNOSIS — I509 Heart failure, unspecified: Secondary | ICD-10-CM | POA: Diagnosis not present

## 2022-08-16 DIAGNOSIS — E1140 Type 2 diabetes mellitus with diabetic neuropathy, unspecified: Secondary | ICD-10-CM | POA: Diagnosis not present

## 2022-08-16 DIAGNOSIS — I130 Hypertensive heart and chronic kidney disease with heart failure and stage 1 through stage 4 chronic kidney disease, or unspecified chronic kidney disease: Secondary | ICD-10-CM | POA: Diagnosis not present

## 2022-08-16 DIAGNOSIS — I4821 Permanent atrial fibrillation: Secondary | ICD-10-CM | POA: Diagnosis not present

## 2022-08-16 NOTE — Telephone Encounter (Signed)
Spoke with Leonette Nutting.  She states pt is unable to have lab services without nursing services in place.  Pt is only receiving PT.  RN Maxwell Marion for her assistance.

## 2022-08-19 ENCOUNTER — Encounter: Payer: Self-pay | Admitting: Internal Medicine

## 2022-08-19 NOTE — Progress Notes (Signed)
Carelink Summary Report / Loop Recorder 

## 2022-08-20 DIAGNOSIS — E1140 Type 2 diabetes mellitus with diabetic neuropathy, unspecified: Secondary | ICD-10-CM | POA: Diagnosis not present

## 2022-08-20 DIAGNOSIS — E1122 Type 2 diabetes mellitus with diabetic chronic kidney disease: Secondary | ICD-10-CM | POA: Diagnosis not present

## 2022-08-20 DIAGNOSIS — S32110D Nondisplaced Zone I fracture of sacrum, subsequent encounter for fracture with routine healing: Secondary | ICD-10-CM | POA: Diagnosis not present

## 2022-08-20 DIAGNOSIS — N1832 Chronic kidney disease, stage 3b: Secondary | ICD-10-CM | POA: Diagnosis not present

## 2022-08-20 DIAGNOSIS — I509 Heart failure, unspecified: Secondary | ICD-10-CM | POA: Diagnosis not present

## 2022-08-20 DIAGNOSIS — I130 Hypertensive heart and chronic kidney disease with heart failure and stage 1 through stage 4 chronic kidney disease, or unspecified chronic kidney disease: Secondary | ICD-10-CM | POA: Diagnosis not present

## 2022-08-20 DIAGNOSIS — I4821 Permanent atrial fibrillation: Secondary | ICD-10-CM | POA: Diagnosis not present

## 2022-08-20 DIAGNOSIS — D631 Anemia in chronic kidney disease: Secondary | ICD-10-CM | POA: Diagnosis not present

## 2022-08-20 DIAGNOSIS — S32591D Other specified fracture of right pubis, subsequent encounter for fracture with routine healing: Secondary | ICD-10-CM | POA: Diagnosis not present

## 2022-08-21 NOTE — Telephone Encounter (Signed)
I spoke with the pt to follow up with her and she says she is feeling fairly well today... she is eating and drinking as well as she can... I will find a [facility] close to her and she says she can get out to go later today.

## 2022-08-21 NOTE — Addendum Note (Signed)
Addended by: Bertram Millard on: 08/21/2022 01:52 PM   Modules accepted: Orders

## 2022-08-22 ENCOUNTER — Other Ambulatory Visit: Payer: Self-pay

## 2022-08-22 DIAGNOSIS — Z79899 Other long term (current) drug therapy: Secondary | ICD-10-CM | POA: Diagnosis not present

## 2022-08-22 DIAGNOSIS — S32110D Nondisplaced Zone I fracture of sacrum, subsequent encounter for fracture with routine healing: Secondary | ICD-10-CM | POA: Diagnosis not present

## 2022-08-22 DIAGNOSIS — N1832 Chronic kidney disease, stage 3b: Secondary | ICD-10-CM | POA: Diagnosis not present

## 2022-08-22 DIAGNOSIS — I4891 Unspecified atrial fibrillation: Secondary | ICD-10-CM

## 2022-08-22 DIAGNOSIS — S32591D Other specified fracture of right pubis, subsequent encounter for fracture with routine healing: Secondary | ICD-10-CM | POA: Diagnosis not present

## 2022-08-22 DIAGNOSIS — E1122 Type 2 diabetes mellitus with diabetic chronic kidney disease: Secondary | ICD-10-CM | POA: Diagnosis not present

## 2022-08-22 DIAGNOSIS — D631 Anemia in chronic kidney disease: Secondary | ICD-10-CM | POA: Diagnosis not present

## 2022-08-22 DIAGNOSIS — I480 Paroxysmal atrial fibrillation: Secondary | ICD-10-CM | POA: Diagnosis not present

## 2022-08-22 DIAGNOSIS — I130 Hypertensive heart and chronic kidney disease with heart failure and stage 1 through stage 4 chronic kidney disease, or unspecified chronic kidney disease: Secondary | ICD-10-CM | POA: Diagnosis not present

## 2022-08-22 DIAGNOSIS — I509 Heart failure, unspecified: Secondary | ICD-10-CM | POA: Diagnosis not present

## 2022-08-22 DIAGNOSIS — I10 Essential (primary) hypertension: Secondary | ICD-10-CM

## 2022-08-22 DIAGNOSIS — E1140 Type 2 diabetes mellitus with diabetic neuropathy, unspecified: Secondary | ICD-10-CM | POA: Diagnosis not present

## 2022-08-22 DIAGNOSIS — I4821 Permanent atrial fibrillation: Secondary | ICD-10-CM | POA: Diagnosis not present

## 2022-08-23 ENCOUNTER — Telehealth: Payer: Self-pay

## 2022-08-23 NOTE — Telephone Encounter (Signed)
I spoke with the Amy [name] and her husband and the Amy [name] will see Dr Tenny Craw on 09/03/22 an add in day at 2:00 pm... Amy [name] advised her lab results and will try to hydrate more.

## 2022-08-23 NOTE — Telephone Encounter (Signed)
-----   Message from Dietrich Pates sent at 08/23/2022  6:43 AM EDT ----- Labs pretty good   Stay hydrated

## 2022-08-28 DIAGNOSIS — S32110D Nondisplaced Zone I fracture of sacrum, subsequent encounter for fracture with routine healing: Secondary | ICD-10-CM | POA: Diagnosis not present

## 2022-08-28 DIAGNOSIS — E1122 Type 2 diabetes mellitus with diabetic chronic kidney disease: Secondary | ICD-10-CM | POA: Diagnosis not present

## 2022-08-28 DIAGNOSIS — I4821 Permanent atrial fibrillation: Secondary | ICD-10-CM | POA: Diagnosis not present

## 2022-08-28 DIAGNOSIS — S32591D Other specified fracture of right pubis, subsequent encounter for fracture with routine healing: Secondary | ICD-10-CM | POA: Diagnosis not present

## 2022-08-28 DIAGNOSIS — I509 Heart failure, unspecified: Secondary | ICD-10-CM | POA: Diagnosis not present

## 2022-08-28 DIAGNOSIS — N1832 Chronic kidney disease, stage 3b: Secondary | ICD-10-CM | POA: Diagnosis not present

## 2022-08-28 DIAGNOSIS — D631 Anemia in chronic kidney disease: Secondary | ICD-10-CM | POA: Diagnosis not present

## 2022-08-28 DIAGNOSIS — I130 Hypertensive heart and chronic kidney disease with heart failure and stage 1 through stage 4 chronic kidney disease, or unspecified chronic kidney disease: Secondary | ICD-10-CM | POA: Diagnosis not present

## 2022-08-28 DIAGNOSIS — E1140 Type 2 diabetes mellitus with diabetic neuropathy, unspecified: Secondary | ICD-10-CM | POA: Diagnosis not present

## 2022-08-29 ENCOUNTER — Ambulatory Visit: Payer: Medicare PPO | Admitting: Internal Medicine

## 2022-09-01 NOTE — Progress Notes (Signed)
Cardiology Office Note   Date:  09/16/2022   ID:  Amy [name], DOB April 02, 1935, MRN [id number]  PCP:  Pincus Sanes, MD  Cardiologist:   Dietrich Pates, MD    Pt presents for follow up of syncope, HTN and atrial fibrillation   History of Present Illness: Amy [name] is a 87 y.o. female with a history of atrial flutter (s/p ablation 2008), atrial fibrillation, syncope, HTN, RAS (s/p PTA/stent to R renal artery Jan 2023  I saw the pt in Sept 2023  On  Nov 08, 2021 she was seen in ER for severe BP elevations   RA USN 11/20/21 R renal artery > 60% stenotic   Referred to [facility] Seen in office on 01/24/22   Complained of ankle edema   BP 120s to 150/  Amlodipine decreased from 10 mg to 5 mg, losartan stopped and olmesartan 40 mg started.  She was seen in ER on 02/14/22 with severe elevation in BP  Had some jaw pain and indigestio She took a clonidine prior to ER arrival   Got another clonidine there as well as IV fluids   She was in SR at the time   Discharged and on walking out of ER had a syncopal spell  No prodrome   Suffered a maxillary, orbital and L olecranon fracture   She was admitted and started on cardene drip.   This was stopped Seen by Margaretha Glassing on 02/28/22   BP 126/48 and p 50 at time (on 2.5 amlodipine, 0.1tid clonidine, 100 mg tid hydralazine, 50mg  bid losartan)  Carotid USN ordered and Zio patch    Monitor showed that she was in afib 91% of time   Since seen she has slowly recovered from fall/fractures      She denies palpitations.   Breathing is OK     BP labile No dizziness    I saw the pt in March 2024  She was seen by Rosette Reveal a few days later and had an ILR placed   In May 2024 she fell and had pelvic fx.   Went th [facility] for rehab  She went back to hosp in June  XRay showed worsening of ramus fracture and new fractures  Underwent into repair    The pt is still recovering  from fractures  Weak Denies palpitations   no presyncope No CP   Breathing is OK, doesn't do  too much   Outpatient Medications Prior to Visit  Medication Sig Dispense Refill   acetaminophen (TYLENOL) 500 MG tablet Take 2 tablets (1,000 mg total) by mouth every 6 (six) hours. 30 tablet 0   ALPRAZolam (XANAX) 0.25 MG tablet Take 1 tablet (0.25 mg total) by mouth 2 (two) times daily as needed for anxiety. 20 tablet 0   atenolol (TENORMIN) 25 MG tablet Take 1 tablet (25 mg total) by mouth daily. 90 tablet 1   Calcium Carbonate-Vitamin D (CALCIUM + D PO) Take 1 tablet by mouth daily.     latanoprost (XALATAN) 0.005 % ophthalmic solution Place 1 drop into both eyes at bedtime. 2.5 mL 0   levothyroxine (SYNTHROID) 50 MCG tablet Take 1 tablet (50 mcg total) by mouth daily. 30 tablet 0   linaclotide (LINZESS) 72 MCG capsule Take 1 capsule (72 mcg total) by mouth as needed (for constipation). 30 capsule 0   melatonin 5 MG TABS Take 5 mg by mouth at bedtime.     methocarbamol (ROBAXIN) 500 MG tablet  Take 1 tablet (500 mg total) by mouth 3 (three) times daily. 90 tablet 0   polyethylene glycol (MIRALAX / GLYCOLAX) 17 g packet Take 17 g by mouth 2 (two) times daily. 14 each 0   Rivaroxaban (XARELTO) 15 MG TABS tablet Take 1 tablet (15 mg total) by mouth daily with supper. 42 tablet    amLODipine (NORVASC) 10 MG tablet Take 1 tablet (10 mg total) by mouth daily. 30 tablet 0   cloNIDine (CATAPRES) 0.1 MG tablet Take 1 tablet (0.1 mg total) by mouth 3 (three) times daily. 90 tablet 0   flecainide (TAMBOCOR) 50 MG tablet Take 1 tablet (50 mg total) by mouth 2 (two) times daily. 60 tablet 0   hydrALAZINE (APRESOLINE) 100 MG tablet Take 1 tablet (100 mg total) by mouth 3 (three) times daily. 90 tablet 0   losartan (COZAAR) 100 MG tablet Take 1 tablet (100 mg total) by mouth daily. 30 tablet 0   oxyCODONE-acetaminophen (PERCOCET/ROXICET) 5-325 MG tablet Take 1 tablet by mouth every 4 (four) hours as needed for severe pain. 42 tablet 0   traMADol (ULTRAM) 50 MG tablet Take 1 tablet (50 mg total) by mouth  every 8 (eight) hours as needed for moderate pain. 6 tablet 0   No facility-administered medications prior to visit.     Allergies:   Patient has no known allergies.   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   Right Malar Cheek   Renal artery stenosis (HCC)     Past Surgical History:  Procedure Laterality Date   A FLUTTER ABLATION     KNEE ARTHROSCOPY Right    ORIF PELVIC FRACTURE WITH PERCUTANEOUS SCREWS N/A 07/19/2022   Procedure: ORIF PELVIC FRACTURE WITH PERCUTANEOUS SCREWS;  Surgeon: Roby Lofts, MD;  Location: [facility] OR;  Service: Orthopedics;  Laterality: N/A;   PERIPHERAL VASCULAR INTERVENTION Right 02/13/2021   Procedure: PERIPHERAL VASCULAR INTERVENTION;  Surgeon: Nada Libman, MD;  Location: [facility] INVASIVE CV LAB;  Service: Cardiovascular;  Laterality: Right;   RENAL ANGIOGRAPHY N/A 02/13/2021   Procedure: RENAL ANGIOGRAPHY;  Surgeon: Nada Libman, MD;  Location: [facility] INVASIVE CV LAB;  Service: Cardiovascular;  Laterality: N/A;   TUBAL LIGATION       Social History:  The patient  reports that she has never smoked. She has never been exposed to tobacco smoke. She has never used smokeless tobacco. She reports that she does not drink alcohol and does not use drugs.   Family History:  The patient's family history includes Colon cancer in her brother; Heart disease in her mother; Stroke (age of onset: 25) in her mother; Stroke (age of onset: 45) in her father.    ROS:  Please see the history of present illness. All other systems are reviewed and  Negative to the above problem except as noted.    PHYSICAL EXAM: VS:  BP (!) 158/80   Pulse 68   Ht 5\' 7"  (1.702 m)   Wt 107 lb 12.8 oz (48.9 kg)   LMP  (LMP Unknown)   SpO2 97%    BMI 16.88 kg/m      GEN: Thin 87 yo who is in NAD  HEENT: tStill with old bruise on L maxilla Neck: JVP is normal    Cardiac:Irreg irreg ; no murmur  No LE edema  Respiratory:  clear to auscultation  GII: soft, nontender  No hepatomegaly   EKG:  EKG not done today   Event monitor   Jan 2024  Predominant rhythm:  Atrial fibrillation 91% of time   Rates 47 to 133 bpm   Average HR 77 bpm   Rare PVC  Carotid USN  Jan 2024  Right Carotid: Velocities in the right ICA are consistent with a 1-39%  stenosis.  Left Carotid: There is no evidence of stenosis in the left ICA. The  extracranial vessels were near-normal with only minimal wall thickening or plaque.  Vertebrals:  Bilateral vertebral arteries demonstrate antegrade flow.  Subclavians: Normal flow hemodynamics were seen in bilateral subclavian arteries.    Echo   02/15/22 1. Left ventricular ejection fraction, by estimation, is 65 to 70%. The  left ventricle has normal function. The left ventricle has no regional  wall motion abnormalities. Left ventricular diastolic parameters are  consistent with Grade II diastolic  dysfunction (pseudonormalization).   2. Right ventricular systolic function is normal. The right ventricular  size is normal. There is moderately elevated pulmonary artery systolic  pressure. The estimated right ventricular systolic pressure is 54.0 mmHg.   3. Left atrial size was moderately dilated.   4. A small pericardial effusion is present. The pericardial effusion is  posterior to the left ventricle.   5. The mitral valve is degenerative. Mild mitral valve regurgitation.   6. Tricuspid valve regurgitation is mild to moderate.   7. The aortic valve is tricuspid. There is mild calcification of the  aortic valve. Aortic valve regurgitation is trivial. Aortic valve  sclerosis/calcification is present, without any evidence of aortic  stenosis.   8. The inferior vena cava is normal in size with greater than 50%   respiratory variability, suggesting right atrial pressure of 3 mmHg.   Renal Artery [place] 11/20/21 Summary:  Renal:    Right: Evidence of a > 60% stenosis of the right renal artery. RRV         flow present. Abnormal size for the right kidney. Abnormal         right Resistive Index. Normal cortical thickness of right         kidney.  Left:  No evidence of left renal artery stenosis. LRV flow present.         Abnormal size for the left kidney. Abnormal left Resisitve         Index. Normal cortical thickness of the left kidney.  Mesenteric:  Normal Celiac artery findings. 70 to 99% stenosis in the superior  mesenteric  artery. Areas of limited visceral study include right kidney size, left  kidney  size and right parenchymal flow.    Summary: Renal: Right: Abnormal right Resistive Index. Normal cortical thickness of right kidney. Probable occlusion of the right renal artery. Left: Abnormal left Resisitve Index. Normal cortical thickness of the left kidney. Normal size of left kidney. No evidence of left renal artery stenosis. LRV flow present. Mesenteric: Normal Celiac artery findings. 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   Lipid Panel    Component Value Date/Time   CHOL 196 05/01/2022 1450   CHOL 219 (H) 10/12/2020 1617   TRIG 96.0 05/01/2022 1450   HDL 93.50 05/01/2022 1450   HDL 95 10/12/2020 1617   CHOLHDL 2 05/01/2022 1450  VLDL 19.2 05/01/2022 1450   LDLCALC 83 05/01/2022 1450   LDLCALC 109 (H) 10/12/2020 1617   LDLDIRECT 84.9 11/15/2008 0924      Wt Readings from Last 3 Encounters:  09/04/22 107 lb 12.8 oz (48.9 kg)  09/03/22 107 lb 12.8 oz (48.9 kg)  07/18/22 115 lb 8.3 oz (52.4 kg)      ASSESSMENT AND PLAN:  1  Atrial fibrllation  PT now in atrial fibrillation.   Interrogation of linq show very high rates   Need to confirm flecanide use    If on, not working   If not on need another agent    2  HTN  BP is high today   Has been labile     FOllow for now  (s/p RA stent in 2023)  3  Lipids    Lipids in April 2024 LDL *3  HDL 94  Trig 96     Current medicines are reviewed at length with the patient today.  The patient does not have concerns regarding medicines.  Signed, Dietrich Pates, MD  09/16/2022 8:49 PM    [facility] [address] [place], [place], [place]  [address] Phone: [phone]; Fax: [phone]

## 2022-09-03 ENCOUNTER — Encounter: Payer: Self-pay | Admitting: Internal Medicine

## 2022-09-03 ENCOUNTER — Ambulatory Visit: Payer: Medicare PPO | Attending: Internal Medicine | Admitting: Internal Medicine

## 2022-09-03 VITALS — BP 158/80 | HR 68 | Ht 67.0 in | Wt 107.8 lb

## 2022-09-03 DIAGNOSIS — Z79899 Other long term (current) drug therapy: Secondary | ICD-10-CM

## 2022-09-03 LAB — CBC
Hematocrit: 35.4 % (ref 34.0–46.6)
Hemoglobin: 11.7 g/dL (ref 11.1–15.9)
MCH: 32.3 pg (ref 26.6–33.0)
MCHC: 33.1 g/dL (ref 31.5–35.7)
MCV: 98 fL — ABNORMAL HIGH (ref 79–97)
Platelets: 309 10*3/uL (ref 150–450)
RBC: 3.62 x10E6/uL — ABNORMAL LOW (ref 3.77–5.28)
RDW: 14.1 % (ref 11.7–15.4)
WBC: 8.1 10*3/uL (ref 3.4–10.8)

## 2022-09-03 MED ORDER — AMLODIPINE BESYLATE 2.5 MG PO TABS: 2.5000 mg | ORAL_TABLET | Freq: Every day | ORAL | 3 refills | Status: DC

## 2022-09-03 NOTE — Patient Instructions (Signed)
Medication Instructions:  Your physician has recommended you make the following change in your medication:  1.) start amlodipine 2.5 mg --take one tablet daily  *If you need a refill on your cardiac medications before your next appointment, please call your pharmacy*   Lab Work: Today: cbc If you have labs (blood work) drawn today and your tests are completely normal, you will receive your results only by: MyChart Message (if you have MyChart) OR A paper copy in the mail If you have any lab test that is abnormal or we need to change your treatment, we will call you to review the results.   Testing/Procedures: none   Follow-Up: At [facility], you and your health needs are our priority.  As part of our continuing mission to provide you with exceptional heart care, we have created designated Provider Care Teams.  These Care Teams include your primary Cardiologist (physician) and Advanced Practice Providers (APPs -  Physician Assistants and Nurse Practitioners) who all work together to provide you with the care you need, when you need it.   Your next appointment:   6 week(s)  Provider:   Dietrich Pates, MD

## 2022-09-03 NOTE — Progress Notes (Unsigned)
Subjective:    Patient ID: Amy [name], female    DOB: [birth year], 87 y.o.   MRN: [id number]      HPI Amy [name] is here for No chief complaint on file.    Falls -    [place] - pelvic fx, pain worsened after rehab - came here - fx worse - sent to ED - s/p repair  then rehab.  Medications and allergies reviewed with patient and updated if appropriate.  Current Outpatient Medications on File Prior to Visit  Medication Sig Dispense Refill   acetaminophen (TYLENOL) 500 MG tablet Take 2 tablets (1,000 mg total) by mouth every 6 (six) hours. 30 tablet 0   ALPRAZolam (XANAX) 0.25 MG tablet Take 1 tablet (0.25 mg total) by mouth 2 (two) times daily as needed for anxiety. 20 tablet 0   amLODipine (NORVASC) 2.5 MG tablet Take 1 tablet (2.5 mg total) by mouth daily. 90 tablet 3   atenolol (TENORMIN) 25 MG tablet Take 1 tablet (25 mg total) by mouth daily. 90 tablet 1   Calcium Carbonate-Vitamin D (CALCIUM + D PO) Take 1 tablet by mouth daily.     cloNIDine (CATAPRES) 0.1 MG tablet Take 1 tablet (0.1 mg total) by mouth 3 (three) times daily. 90 tablet 0   flecainide (TAMBOCOR) 50 MG tablet Take 1 tablet (50 mg total) by mouth 2 (two) times daily. 60 tablet 0   hydrALAZINE (APRESOLINE) 100 MG tablet Take 1 tablet (100 mg total) by mouth 3 (three) times daily. 90 tablet 0   latanoprost (XALATAN) 0.005 % ophthalmic solution Place 1 drop into both eyes at bedtime. 2.5 mL 0   levothyroxine (SYNTHROID) 50 MCG tablet Take 1 tablet (50 mcg total) by mouth daily. 30 tablet 0   linaclotide (LINZESS) 72 MCG capsule Take 1 capsule (72 mcg total) by mouth as needed (for constipation). 30 capsule 0   losartan (COZAAR) 100 MG tablet Take 1 tablet (100 mg total) by mouth daily. 30 tablet 0   melatonin 5 MG TABS Take 5 mg by mouth at bedtime.     methocarbamol (ROBAXIN) 500 MG tablet Take 1 tablet (500 mg total) by mouth 3 (three) times daily. 90 tablet 0   oxyCODONE-acetaminophen  (PERCOCET/ROXICET) 5-325 MG tablet Take 1 tablet by mouth every 4 (four) hours as needed for severe pain. 42 tablet 0   polyethylene glycol (MIRALAX / GLYCOLAX) 17 g packet Take 17 g by mouth 2 (two) times daily. 14 each 0   Rivaroxaban (XARELTO) 15 MG TABS tablet Take 1 tablet (15 mg total) by mouth daily with supper. 42 tablet    traMADol (ULTRAM) 50 MG tablet Take 1 tablet (50 mg total) by mouth every 8 (eight) hours as needed for moderate pain. 6 tablet 0   No current facility-administered medications on file prior to visit.    Review of Systems     Objective:  There were no vitals filed for this visit. BP Readings from Last 3 Encounters:  09/03/22 (!) 158/80  07/22/22 130/76  07/17/22 (!) 124/58   Wt Readings from Last 3 Encounters:  09/03/22 107 lb 12.8 oz (48.9 kg)  07/18/22 115 lb 8.3 oz (52.4 kg)  07/17/22 120 lb (54.4 kg)   There is no height or weight on file to calculate BMI.    Physical Exam         Assessment & Plan:    See Problem List for Assessment and Plan of chronic medical problems.

## 2022-09-04 ENCOUNTER — Encounter: Payer: Self-pay | Admitting: Internal Medicine

## 2022-09-04 ENCOUNTER — Ambulatory Visit (INDEPENDENT_AMBULATORY_CARE_PROVIDER_SITE_OTHER): Payer: Medicare PPO | Admitting: Internal Medicine

## 2022-09-04 VITALS — BP 114/64 | HR 65 | Temp 98.3°F | Ht 67.0 in | Wt 107.8 lb

## 2022-09-04 DIAGNOSIS — F419 Anxiety disorder, unspecified: Secondary | ICD-10-CM | POA: Diagnosis not present

## 2022-09-04 DIAGNOSIS — F5101 Primary insomnia: Secondary | ICD-10-CM

## 2022-09-04 DIAGNOSIS — R5381 Other malaise: Secondary | ICD-10-CM | POA: Diagnosis not present

## 2022-09-04 DIAGNOSIS — F32A Depression, unspecified: Secondary | ICD-10-CM

## 2022-09-04 DIAGNOSIS — M810 Age-related osteoporosis without current pathological fracture: Secondary | ICD-10-CM

## 2022-09-04 MED ORDER — TRAMADOL HCL 50 MG PO TABS: 50.0000 mg | ORAL_TABLET | Freq: Three times a day (TID) | ORAL | Status: DC | PRN

## 2022-09-04 MED ORDER — ESCITALOPRAM OXALATE 5 MG PO TABS: 5.0000 mg | ORAL_TABLET | Freq: Every day | ORAL | 5 refills | Status: DC

## 2022-09-04 NOTE — Assessment & Plan Note (Addendum)
Chronic She has had some mild depression and anxiety for a while, but this has been persistent and with her current medical problems I think she would benefit from medication Start Lexapro 5 mg daily-hopefully this will stimulate her appetite some Continue alprazolam 0.25 mg twice daily as needed Will titrate if tolerated

## 2022-09-04 NOTE — Assessment & Plan Note (Signed)
Chronic Sleep sometimes better than others She will continue melatonin for now

## 2022-09-04 NOTE — Assessment & Plan Note (Signed)
Subacute Has significant physical deconditioning in addition to poor balance, recurrent falls Home PT ordered for her by cardiology and stress this is very important Advised that she needs to be using her walker to ambulate all the time Discussed that she needs to work on increasing her activity at home Also discussed the importance of improving her nutrition because that will influence her strength and healing

## 2022-09-04 NOTE — Patient Instructions (Addendum)
       Medications changes include :   lexapro 5 mg daily      Return for follow up as scheduled.

## 2022-09-04 NOTE — Assessment & Plan Note (Signed)
Chronic Recent pelvic fractures She is deferred bone density scans and treatment for her osteoporosis in the past I discussed again the high risk of having another fracture with or without a fall Was on Fosamax in the past and it did not help Would recommend Prolia or Evenity.  Evenity would be ideal, but I think that might be too much to come once a month She will think about Prolia and let me know

## 2022-09-05 DIAGNOSIS — D631 Anemia in chronic kidney disease: Secondary | ICD-10-CM | POA: Diagnosis not present

## 2022-09-05 DIAGNOSIS — S32591D Other specified fracture of right pubis, subsequent encounter for fracture with routine healing: Secondary | ICD-10-CM | POA: Diagnosis not present

## 2022-09-05 DIAGNOSIS — I509 Heart failure, unspecified: Secondary | ICD-10-CM | POA: Diagnosis not present

## 2022-09-05 DIAGNOSIS — N1832 Chronic kidney disease, stage 3b: Secondary | ICD-10-CM | POA: Diagnosis not present

## 2022-09-05 DIAGNOSIS — E1140 Type 2 diabetes mellitus with diabetic neuropathy, unspecified: Secondary | ICD-10-CM | POA: Diagnosis not present

## 2022-09-05 DIAGNOSIS — I4821 Permanent atrial fibrillation: Secondary | ICD-10-CM | POA: Diagnosis not present

## 2022-09-05 DIAGNOSIS — I130 Hypertensive heart and chronic kidney disease with heart failure and stage 1 through stage 4 chronic kidney disease, or unspecified chronic kidney disease: Secondary | ICD-10-CM | POA: Diagnosis not present

## 2022-09-05 DIAGNOSIS — S32110D Nondisplaced Zone I fracture of sacrum, subsequent encounter for fracture with routine healing: Secondary | ICD-10-CM | POA: Diagnosis not present

## 2022-09-05 DIAGNOSIS — E1122 Type 2 diabetes mellitus with diabetic chronic kidney disease: Secondary | ICD-10-CM | POA: Diagnosis not present

## 2022-09-06 ENCOUNTER — Telehealth: Payer: Self-pay | Admitting: Internal Medicine

## 2022-09-06 NOTE — Telephone Encounter (Signed)
Okay for orders? 

## 2022-09-06 NOTE — Telephone Encounter (Signed)
Marcelino Duster from [employer] called and said one of her coworkers said the patient is diabetic. She had not received anything previously saying she is. Marcelino Duster would like clarification on whether or not the patient is diabetic. Diabetes does not seem to be on patient's problem list. Best callback is [phone](secure).

## 2022-09-06 NOTE — Telephone Encounter (Signed)
Verbals given to Milner today.

## 2022-09-06 NOTE — Telephone Encounter (Signed)
HH ORDERS   Caller Name: [facility] Agency Name: Randolm Idol Phone #: [phone](secure)  Service Requested: PT (examples: OT/PT/Skilled Nursing/Social Work/Speech Therapy/Wound Care)  Frequency of Visits: 1X a week for 4 weeks, every other week for 4 weeks

## 2022-09-07 LAB — CUP PACEART REMOTE DEVICE CHECK
Date Time Interrogation Session: 20240816230930
Implantable Pulse Generator Implant Date: 20240329

## 2022-09-09 ENCOUNTER — Ambulatory Visit (INDEPENDENT_AMBULATORY_CARE_PROVIDER_SITE_OTHER): Payer: Medicare PPO

## 2022-09-09 ENCOUNTER — Ambulatory Visit: Payer: Medicare PPO | Admitting: Internal Medicine

## 2022-09-09 ENCOUNTER — Telehealth: Payer: Self-pay

## 2022-09-09 DIAGNOSIS — I4891 Unspecified atrial fibrillation: Secondary | ICD-10-CM | POA: Diagnosis not present

## 2022-09-09 MED ORDER — AMIODARONE HCL 200 MG PO TABS: 200.0000 mg | ORAL_TABLET | Freq: Two times a day (BID) | ORAL | 3 refills | Status: DC

## 2022-09-09 NOTE — Telephone Encounter (Addendum)
I spoke with the pt and updated her med list and per Dr Tenny Craw since she has been off of her Flecainide... she will start Amiodarone 200 mg BID and I will follow up about bringing her back in for follow up a few weeks after starting.

## 2022-09-10 NOTE — Telephone Encounter (Signed)
Message left for Amy [name] that patient is not diabetic.

## 2022-09-13 ENCOUNTER — Other Ambulatory Visit: Payer: Self-pay | Admitting: Internal Medicine

## 2022-09-13 DIAGNOSIS — N1832 Chronic kidney disease, stage 3b: Secondary | ICD-10-CM | POA: Diagnosis not present

## 2022-09-13 DIAGNOSIS — F32A Depression, unspecified: Secondary | ICD-10-CM

## 2022-09-13 DIAGNOSIS — E1122 Type 2 diabetes mellitus with diabetic chronic kidney disease: Secondary | ICD-10-CM | POA: Diagnosis not present

## 2022-09-13 DIAGNOSIS — E1140 Type 2 diabetes mellitus with diabetic neuropathy, unspecified: Secondary | ICD-10-CM | POA: Diagnosis not present

## 2022-09-13 DIAGNOSIS — I4821 Permanent atrial fibrillation: Secondary | ICD-10-CM | POA: Diagnosis not present

## 2022-09-13 DIAGNOSIS — D631 Anemia in chronic kidney disease: Secondary | ICD-10-CM | POA: Diagnosis not present

## 2022-09-13 DIAGNOSIS — I130 Hypertensive heart and chronic kidney disease with heart failure and stage 1 through stage 4 chronic kidney disease, or unspecified chronic kidney disease: Secondary | ICD-10-CM | POA: Diagnosis not present

## 2022-09-13 DIAGNOSIS — I509 Heart failure, unspecified: Secondary | ICD-10-CM | POA: Diagnosis not present

## 2022-09-13 DIAGNOSIS — E039 Hypothyroidism, unspecified: Secondary | ICD-10-CM | POA: Diagnosis not present

## 2022-09-13 DIAGNOSIS — F419 Anxiety disorder, unspecified: Secondary | ICD-10-CM

## 2022-09-13 DIAGNOSIS — M1710 Unilateral primary osteoarthritis, unspecified knee: Secondary | ICD-10-CM

## 2022-09-13 DIAGNOSIS — I4892 Unspecified atrial flutter: Secondary | ICD-10-CM | POA: Diagnosis not present

## 2022-09-18 DIAGNOSIS — S32110D Nondisplaced Zone I fracture of sacrum, subsequent encounter for fracture with routine healing: Secondary | ICD-10-CM | POA: Diagnosis not present

## 2022-09-18 DIAGNOSIS — E1122 Type 2 diabetes mellitus with diabetic chronic kidney disease: Secondary | ICD-10-CM | POA: Diagnosis not present

## 2022-09-18 DIAGNOSIS — E1140 Type 2 diabetes mellitus with diabetic neuropathy, unspecified: Secondary | ICD-10-CM | POA: Diagnosis not present

## 2022-09-18 DIAGNOSIS — I130 Hypertensive heart and chronic kidney disease with heart failure and stage 1 through stage 4 chronic kidney disease, or unspecified chronic kidney disease: Secondary | ICD-10-CM | POA: Diagnosis not present

## 2022-09-18 DIAGNOSIS — I509 Heart failure, unspecified: Secondary | ICD-10-CM | POA: Diagnosis not present

## 2022-09-18 DIAGNOSIS — D631 Anemia in chronic kidney disease: Secondary | ICD-10-CM | POA: Diagnosis not present

## 2022-09-18 DIAGNOSIS — I4821 Permanent atrial fibrillation: Secondary | ICD-10-CM | POA: Diagnosis not present

## 2022-09-18 DIAGNOSIS — S32591D Other specified fracture of right pubis, subsequent encounter for fracture with routine healing: Secondary | ICD-10-CM | POA: Diagnosis not present

## 2022-09-18 DIAGNOSIS — N1832 Chronic kidney disease, stage 3b: Secondary | ICD-10-CM | POA: Diagnosis not present

## 2022-09-19 NOTE — Progress Notes (Signed)
Carelink Summary Report / Loop Recorder 

## 2022-09-25 DIAGNOSIS — I4821 Permanent atrial fibrillation: Secondary | ICD-10-CM | POA: Diagnosis not present

## 2022-09-25 DIAGNOSIS — E1140 Type 2 diabetes mellitus with diabetic neuropathy, unspecified: Secondary | ICD-10-CM | POA: Diagnosis not present

## 2022-09-25 DIAGNOSIS — I509 Heart failure, unspecified: Secondary | ICD-10-CM | POA: Diagnosis not present

## 2022-09-25 DIAGNOSIS — I130 Hypertensive heart and chronic kidney disease with heart failure and stage 1 through stage 4 chronic kidney disease, or unspecified chronic kidney disease: Secondary | ICD-10-CM | POA: Diagnosis not present

## 2022-09-25 DIAGNOSIS — N1832 Chronic kidney disease, stage 3b: Secondary | ICD-10-CM | POA: Diagnosis not present

## 2022-09-25 DIAGNOSIS — E1122 Type 2 diabetes mellitus with diabetic chronic kidney disease: Secondary | ICD-10-CM | POA: Diagnosis not present

## 2022-09-25 DIAGNOSIS — D631 Anemia in chronic kidney disease: Secondary | ICD-10-CM | POA: Diagnosis not present

## 2022-09-25 DIAGNOSIS — S32591D Other specified fracture of right pubis, subsequent encounter for fracture with routine healing: Secondary | ICD-10-CM | POA: Diagnosis not present

## 2022-09-25 DIAGNOSIS — S32110D Nondisplaced Zone I fracture of sacrum, subsequent encounter for fracture with routine healing: Secondary | ICD-10-CM | POA: Diagnosis not present

## 2022-09-26 ENCOUNTER — Other Ambulatory Visit: Payer: Self-pay | Admitting: Internal Medicine

## 2022-09-30 NOTE — Telephone Encounter (Signed)
Pt to see Dr Tenny Craw 10/04/22.

## 2022-10-01 ENCOUNTER — Encounter: Payer: Self-pay | Admitting: Internal Medicine

## 2022-10-02 ENCOUNTER — Other Ambulatory Visit: Payer: Self-pay | Admitting: Adult Health

## 2022-10-02 DIAGNOSIS — I4821 Permanent atrial fibrillation: Secondary | ICD-10-CM | POA: Diagnosis not present

## 2022-10-02 DIAGNOSIS — S32591D Other specified fracture of right pubis, subsequent encounter for fracture with routine healing: Secondary | ICD-10-CM | POA: Diagnosis not present

## 2022-10-02 DIAGNOSIS — I130 Hypertensive heart and chronic kidney disease with heart failure and stage 1 through stage 4 chronic kidney disease, or unspecified chronic kidney disease: Secondary | ICD-10-CM | POA: Diagnosis not present

## 2022-10-02 DIAGNOSIS — E1140 Type 2 diabetes mellitus with diabetic neuropathy, unspecified: Secondary | ICD-10-CM | POA: Diagnosis not present

## 2022-10-02 DIAGNOSIS — I509 Heart failure, unspecified: Secondary | ICD-10-CM | POA: Diagnosis not present

## 2022-10-02 DIAGNOSIS — D631 Anemia in chronic kidney disease: Secondary | ICD-10-CM | POA: Diagnosis not present

## 2022-10-02 DIAGNOSIS — N1832 Chronic kidney disease, stage 3b: Secondary | ICD-10-CM | POA: Diagnosis not present

## 2022-10-02 DIAGNOSIS — S32110D Nondisplaced Zone I fracture of sacrum, subsequent encounter for fracture with routine healing: Secondary | ICD-10-CM | POA: Diagnosis not present

## 2022-10-02 DIAGNOSIS — E1122 Type 2 diabetes mellitus with diabetic chronic kidney disease: Secondary | ICD-10-CM | POA: Diagnosis not present

## 2022-10-03 ENCOUNTER — Other Ambulatory Visit: Payer: Self-pay | Admitting: Adult Health

## 2022-10-03 NOTE — Progress Notes (Signed)
Cardiology Office Note   Date:  10/04/2022   ID:  Amy [name], DOB 22-May-1935, MRN [id number]  PCP:  Pincus Sanes, MD  Cardiologist:   Dietrich Pates, MD    Pt presents for follow up of atrial fib, HTN and syncope  History of Present Illness: Amy [name] is a 87 y.o. female with a history of atrial flutter (s/p ablation 2008), atrial fibrillation, syncope, HTN, RAS (s/p PTA/stent to R renal artery Jan 2023) Oct 2023   ER visist for severe HTN  USN of renal arteries showed >60% R renal artery stenosis. -Jan 2024  BP 120-150   Amldodiipine dereased to 5 mg ; Pt switched from losartan to olmesartan 40mg   -Feb 14, 2022:  ER visit for severe BP elevation.  Given clonidine and IV fluids    In SR at times Walking out of ER had syncopal spell   No prodome.   Suffered maxillary, orbital and L olecranon fracture    Admitted for BP control     -Seen by Margaretha Glassing in Feb 2024  BP 126  P 50  (on amlodipine 2.5; clonidine 0.1 tid; hydralazine 100 tid; losartan 50 bid).  Carotid USN mild CVdz on R carotid  Zio patch showed afib 91% time  Average HR 77 bpm -March 2024  Seen by Rosette Reveal   ILR placed    -May 2024   [facility]   Suffered pelvic fracture    Worsened with ramus and other fractures    Underwnt surgical repair. - Pt seen BP 158/80   Interrogation of ILR showed very high rates   Based on this pt started on amiodaonre 200 mg bid  Since seen she denies palpitations   Says she is getting around some    Breathing is OK Husband says she weak     No falls   No syncope   Outpatient Medications Prior to Visit  Medication Sig Dispense Refill   acetaminophen (TYLENOL) 500 MG tablet Take 2 tablets (1,000 mg total) by mouth every 6 (six) hours. 30 tablet 0   ALPRAZolam (XANAX) 0.25 MG tablet Take 1 tablet (0.25 mg total) by mouth 2 (two) times daily as needed for anxiety. 20 tablet 0   amiodarone (PACERONE) 200 MG tablet Take 1 tablet (200 mg total) by mouth 2 (two) times daily. 180 tablet 3   amLODipine  (NORVASC) 2.5 MG tablet Take 1 tablet (2.5 mg total) by mouth daily. 90 tablet 3   atenolol (TENORMIN) 25 MG tablet Take 1 tablet (25 mg total) by mouth daily. 90 tablet 1   Calcium Carbonate-Vitamin D (CALCIUM + D PO) Take 1 tablet by mouth daily.     escitalopram (LEXAPRO) 5 MG tablet TAKE 1 TABLET (5 MG TOTAL) BY MOUTH DAILY. 90 tablet 2   latanoprost (XALATAN) 0.005 % ophthalmic solution Place 1 drop into both eyes at bedtime. 2.5 mL 0   levothyroxine (SYNTHROID) 50 MCG tablet Take 1 tablet (50 mcg total) by mouth daily. 30 tablet 0   linaclotide (LINZESS) 72 MCG capsule Take 1 capsule (72 mcg total) by mouth as needed (for constipation). 30 capsule 0   melatonin 5 MG TABS Take 5 mg by mouth at bedtime.     methocarbamol (ROBAXIN) 500 MG tablet Take 1 tablet (500 mg total) by mouth 3 (three) times daily. (Patient taking differently: Take 500 mg by mouth as needed for muscle spasms.) 90 tablet 0   polyethylene glycol (MIRALAX / GLYCOLAX) 17 g packet  Take 17 g by mouth 2 (two) times daily. 14 each 0   Rivaroxaban (XARELTO) 15 MG TABS tablet Take 1 tablet (15 mg total) by mouth daily with supper. (Patient taking differently: Take 15 mg by mouth daily with supper. Pt takes every other day) 42 tablet    traMADol (ULTRAM) 50 MG tablet TAKE 1 TABLET BY MOUTH EVERY 8 HOURS AS NEEDED. 90 tablet 0   No facility-administered medications prior to visit.     Allergies:   Patient has no known allergies.   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   Right Malar Cheek   Renal artery stenosis (HCC)     Past Surgical History:  Procedure Laterality Date   A FLUTTER ABLATION     KNEE ARTHROSCOPY Right    ORIF PELVIC FRACTURE WITH PERCUTANEOUS SCREWS N/A  07/19/2022   Procedure: ORIF PELVIC FRACTURE WITH PERCUTANEOUS SCREWS;  Surgeon: Roby Lofts, MD;  Location: [facility] OR;  Service: Orthopedics;  Laterality: N/A;   PERIPHERAL VASCULAR INTERVENTION Right 02/13/2021   Procedure: PERIPHERAL VASCULAR INTERVENTION;  Surgeon: Nada Libman, MD;  Location: [facility] INVASIVE CV LAB;  Service: Cardiovascular;  Laterality: Right;   RENAL ANGIOGRAPHY N/A 02/13/2021   Procedure: RENAL ANGIOGRAPHY;  Surgeon: Nada Libman, MD;  Location: [facility] INVAS[facility] CV [facility];  Service: Cardiovascular;  Laterality: N/A;   TUBAL LIGATION       Social History:  The patient  reports that she has never smoked. She has never been exposed to tobacco smoke. She has never used smokeless tobacco. She reports that she does not drink alcohol and does not use drugs.   Family History:  The patient's family history includes Colon cancer in her brother; Heart disease in her mother; Stroke (age of onset: 32) in her mother; Stroke (age of onset: 71) in her father.    ROS:  Please see the history of present illness. All other systems are reviewed and  Negative to the above problem except as noted.    PHYSICAL EXAM: VS:  BP 132/80 (BP Location: Left Arm, Patient Position: Sitting, Cuff Size: Normal)   Pulse 63   Ht 5\' 7"  (1.702 m)   Wt 100 lb 9.6 oz (45.6 kg)   LMP  (LMP Unknown)   SpO2 98%   BMI 15.76 kg/m      GEN: Thin 87 yo who is in NAD  Examined in chair   Neck: JVP is normal    Cardiac:Irreg irreg ; no murmur  No LE edema  Respiratory:  clear to auscultation   EKG:  EKG not done    Event monitor   Jan 2024  Predominant rhythm:  Atrial fibrillation 91% of time   Rates 47 to 133 bpm   Average HR 77 bpm   Rare PVC  Carotid USN  Jan 2024  Right Carotid: Velocities in the right ICA are consistent with a 1-39%  stenosis.  Left Carotid: There is no evidence of stenosis in the left ICA. The  extracranial vessels were near-normal with only minimal wall thickening or plaque.   Vertebrals:  Bilateral vertebral arteries demonstrate antegrade flow.  Subclavians: Normal flow hemodynamics were seen in bilateral subclavian  arteries.    Echo   02/15/22 1. Left ventricular ejection fraction, by estimation, is 65 to 70%. The  left ventricle has normal function. The left ventricle has no regional  wall motion abnormalities. Left ventricular diastolic parameters are  consistent with Grade II diastolic  dysfunction (pseudonormalization).   2. Right ventricular systolic function is normal. The right ventricular  size is normal. There is moderately elevated pulmonary artery systolic  pressure. The estimated right ventricular systolic pressure is 54.0 mmHg.   3. Left atrial size was moderately dilated.   4. A small pericardial effusion is present. The pericardial effusion is  posterior to the left ventricle.   5. The mitral valve is degenerative. Mild mitral valve regurgitation.   6. Tricuspid valve regurgitation is mild to moderate.   7. The aortic valve is tricuspid. There is mild calcification of the  aortic valve. Aortic valve regurgitation is trivial. Aortic valve  sclerosis/calcification is present, without any evidence of aortic  stenosis.   8. The inferior vena cava is normal in size with greater than 50%  respiratory variability, suggesting right atrial pressure of 3 mmHg.   Renal Artery [place] 11/20/21 Summary:  Renal:    Right: Evidence of a > 60% stenosis of the right renal artery. RRV         flow present. Abnormal size for the right kidney. Abnormal         right Resistive Index. Normal cortical thickness of right         kidney.  Left:  No evidence of left renal artery stenosis. LRV flow present.         Abnormal size for the left kidney. Abnormal left Resisitve         Index. Normal cortical thickness of the left kidney.  Mesenteric:  Normal Celiac artery findings. 70 to 99% stenosis in the superior  mesenteric  artery. Areas of limited visceral study  include right kidney size, left  kidney  size and right parenchymal flow.    Summary: Renal: Right: Abnormal right Resistive Index. Normal cortical thickness of right kidney. Probable occlusion of the right renal artery. Left: Abnormal left Resisitve Index. Normal cortical thickness of the left kidney. Normal size of left kidney. No evidence of left renal artery stenosis. LRV flow present. Mesenteric: Normal Celiac artery findings. 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   Lipid Panel    Component Value Date/Time   CHOL 196 05/01/2022 1450   CHOL 219 (H) 10/12/2020 1617   TRIG 96.0 05/01/2022 1450   HDL 93.50 05/01/2022 1450   HDL 95 10/12/2020 1617   CHOLHDL 2 05/01/2022 1450   VLDL 19.2 05/01/2022 1450   LDLCALC 83 05/01/2022 1450   LDLCALC 109 (H) 10/12/2020 1617   LDLDIRECT 84.9 11/15/2008 0924      Wt Readings from Last 3 Encounters:  10/04/22 100 lb 9.6 oz (45.6 kg)  09/04/22 107 lb 12.8 oz (48.9 kg)  09/03/22 107 lb 12.8 oz (48.9 kg)      ASSESSMENT AND PLAN:  1  Atrial fibrllation  Pt remains in atrial fibrillation  Rates are OK Keep on amiodarone for now   BID   will review with EP Keep on Eliquis   2  HTN  BP has been very labile this past year  It is OK today   Follow      3  Syncope   One spell when in ER at [facility]   No prodrome  Has ILR in place   4   Lipids    Lipids in April 2024 LDL 83  HDL 94  Trig 96    Follow   Pt very deconditioned  Encouraged her to use bands at home        Current medicines are reviewed at length with the patient today.  The patient does not have concerns regarding medicines.  Signed, Dietrich Pates, MD  10/04/2022 10:35 PM    [facility] [address] [place], [place], [place]  [address] Phone: [phone]; Fax: [phone]

## 2022-10-04 ENCOUNTER — Encounter: Payer: Self-pay | Admitting: Internal Medicine

## 2022-10-04 ENCOUNTER — Ambulatory Visit: Payer: Medicare PPO | Attending: Internal Medicine | Admitting: Internal Medicine

## 2022-10-04 VITALS — BP 132/80 | HR 63 | Ht 67.0 in | Wt 100.6 lb

## 2022-10-04 DIAGNOSIS — R55 Syncope and collapse: Secondary | ICD-10-CM | POA: Diagnosis not present

## 2022-10-04 DIAGNOSIS — I480 Paroxysmal atrial fibrillation: Secondary | ICD-10-CM | POA: Diagnosis not present

## 2022-10-04 NOTE — Patient Instructions (Signed)
Medication Instructions:   Your physician recommends that you continue on your current medications as directed. Please refer to the Current Medication list given to you today.  *If you need a refill on your cardiac medications before your next appointment, please call your pharmacy*    Follow-Up:  IN DECEMBER WITH DR. Tenny Craw

## 2022-10-08 ENCOUNTER — Encounter: Payer: Self-pay | Admitting: Internal Medicine

## 2022-10-09 MED ORDER — SERTRALINE HCL 25 MG PO TABS: 25.0000 mg | ORAL_TABLET | Freq: Every day | ORAL | Status: DC

## 2022-10-14 ENCOUNTER — Ambulatory Visit (INDEPENDENT_AMBULATORY_CARE_PROVIDER_SITE_OTHER): Payer: Medicare PPO

## 2022-10-14 DIAGNOSIS — I480 Paroxysmal atrial fibrillation: Secondary | ICD-10-CM

## 2022-10-14 DIAGNOSIS — R55 Syncope and collapse: Secondary | ICD-10-CM

## 2022-10-15 LAB — CUP PACEART REMOTE DEVICE CHECK
Date Time Interrogation Session: 20240918231155
Implantable Pulse Generator Implant Date: 20240329

## 2022-10-16 DIAGNOSIS — I509 Heart failure, unspecified: Secondary | ICD-10-CM | POA: Diagnosis not present

## 2022-10-16 DIAGNOSIS — E1122 Type 2 diabetes mellitus with diabetic chronic kidney disease: Secondary | ICD-10-CM | POA: Diagnosis not present

## 2022-10-16 DIAGNOSIS — I4821 Permanent atrial fibrillation: Secondary | ICD-10-CM | POA: Diagnosis not present

## 2022-10-16 DIAGNOSIS — S32110D Nondisplaced Zone I fracture of sacrum, subsequent encounter for fracture with routine healing: Secondary | ICD-10-CM | POA: Diagnosis not present

## 2022-10-16 DIAGNOSIS — S32591D Other specified fracture of right pubis, subsequent encounter for fracture with routine healing: Secondary | ICD-10-CM | POA: Diagnosis not present

## 2022-10-16 DIAGNOSIS — D631 Anemia in chronic kidney disease: Secondary | ICD-10-CM | POA: Diagnosis not present

## 2022-10-16 DIAGNOSIS — N1832 Chronic kidney disease, stage 3b: Secondary | ICD-10-CM | POA: Diagnosis not present

## 2022-10-16 DIAGNOSIS — I130 Hypertensive heart and chronic kidney disease with heart failure and stage 1 through stage 4 chronic kidney disease, or unspecified chronic kidney disease: Secondary | ICD-10-CM | POA: Diagnosis not present

## 2022-10-16 DIAGNOSIS — E1140 Type 2 diabetes mellitus with diabetic neuropathy, unspecified: Secondary | ICD-10-CM | POA: Diagnosis not present

## 2022-10-22 ENCOUNTER — Encounter: Payer: Self-pay | Admitting: Internal Medicine

## 2022-10-28 NOTE — Progress Notes (Signed)
Carelink Summary Report / Loop Recorder 

## 2022-10-29 ENCOUNTER — Encounter: Payer: Self-pay | Admitting: Internal Medicine

## 2022-10-29 DIAGNOSIS — M25561 Pain in right knee: Secondary | ICD-10-CM | POA: Diagnosis not present

## 2022-10-29 DIAGNOSIS — M25562 Pain in left knee: Secondary | ICD-10-CM | POA: Diagnosis not present

## 2022-10-29 NOTE — Telephone Encounter (Signed)
I agree    OVerall readings look pretty good   Keep on same meds

## 2022-10-30 ENCOUNTER — Other Ambulatory Visit: Payer: Self-pay | Admitting: Internal Medicine

## 2022-10-30 ENCOUNTER — Ambulatory Visit: Payer: Medicare PPO | Admitting: Physician Assistant

## 2022-10-30 DIAGNOSIS — I130 Hypertensive heart and chronic kidney disease with heart failure and stage 1 through stage 4 chronic kidney disease, or unspecified chronic kidney disease: Secondary | ICD-10-CM | POA: Diagnosis not present

## 2022-10-30 DIAGNOSIS — I4821 Permanent atrial fibrillation: Secondary | ICD-10-CM | POA: Diagnosis not present

## 2022-10-30 DIAGNOSIS — D631 Anemia in chronic kidney disease: Secondary | ICD-10-CM | POA: Diagnosis not present

## 2022-10-30 DIAGNOSIS — S32591D Other specified fracture of right pubis, subsequent encounter for fracture with routine healing: Secondary | ICD-10-CM | POA: Diagnosis not present

## 2022-10-30 DIAGNOSIS — E1140 Type 2 diabetes mellitus with diabetic neuropathy, unspecified: Secondary | ICD-10-CM | POA: Diagnosis not present

## 2022-10-30 DIAGNOSIS — S32110D Nondisplaced Zone I fracture of sacrum, subsequent encounter for fracture with routine healing: Secondary | ICD-10-CM | POA: Diagnosis not present

## 2022-10-30 DIAGNOSIS — E1122 Type 2 diabetes mellitus with diabetic chronic kidney disease: Secondary | ICD-10-CM | POA: Diagnosis not present

## 2022-10-30 DIAGNOSIS — E039 Hypothyroidism, unspecified: Secondary | ICD-10-CM

## 2022-10-30 DIAGNOSIS — N1832 Chronic kidney disease, stage 3b: Secondary | ICD-10-CM | POA: Diagnosis not present

## 2022-10-30 DIAGNOSIS — I509 Heart failure, unspecified: Secondary | ICD-10-CM | POA: Diagnosis not present

## 2022-10-31 ENCOUNTER — Other Ambulatory Visit: Payer: Self-pay | Admitting: Internal Medicine

## 2022-10-31 NOTE — Telephone Encounter (Signed)
Pt's pharmacy is requesting a refill on levothyroxine. Would Dr. Harrington Challenger like to refill this medication? Please address

## 2022-11-04 DIAGNOSIS — H401111 Primary open-angle glaucoma, right eye, mild stage: Secondary | ICD-10-CM | POA: Diagnosis not present

## 2022-11-04 DIAGNOSIS — H04123 Dry eye syndrome of bilateral lacrimal glands: Secondary | ICD-10-CM | POA: Diagnosis not present

## 2022-11-04 DIAGNOSIS — H40012 Open angle with borderline findings, low risk, left eye: Secondary | ICD-10-CM | POA: Diagnosis not present

## 2022-11-18 ENCOUNTER — Ambulatory Visit (INDEPENDENT_AMBULATORY_CARE_PROVIDER_SITE_OTHER): Payer: Medicare PPO

## 2022-11-18 DIAGNOSIS — R55 Syncope and collapse: Secondary | ICD-10-CM

## 2022-11-19 LAB — CUP PACEART REMOTE DEVICE CHECK
Date Time Interrogation Session: 20241027231559
Implantable Pulse Generator Implant Date: 20240329

## 2022-11-26 ENCOUNTER — Other Ambulatory Visit: Payer: Self-pay | Admitting: Physician Assistant

## 2022-11-27 ENCOUNTER — Encounter: Payer: Self-pay | Admitting: Internal Medicine

## 2022-11-27 NOTE — Telephone Encounter (Signed)
1  I have not seen monitor results  2  If BP is over 155 I would take 2.5 mg amlodipine    If BP is still high at 2 hours after can take an additional amlodipine

## 2022-11-29 NOTE — Telephone Encounter (Signed)
This is what I was referring to. Since has resolved.

## 2022-12-02 DIAGNOSIS — H40012 Open angle with borderline findings, low risk, left eye: Secondary | ICD-10-CM | POA: Diagnosis not present

## 2022-12-02 DIAGNOSIS — H401111 Primary open-angle glaucoma, right eye, mild stage: Secondary | ICD-10-CM | POA: Diagnosis not present

## 2022-12-09 NOTE — Progress Notes (Signed)
Carelink Summary Report / Loop Recorder 

## 2022-12-22 LAB — CUP PACEART REMOTE DEVICE CHECK
Date Time Interrogation Session: 20241129230846
Implantable Pulse Generator Implant Date: 20240329

## 2022-12-23 ENCOUNTER — Ambulatory Visit (INDEPENDENT_AMBULATORY_CARE_PROVIDER_SITE_OTHER): Payer: Medicare PPO

## 2022-12-23 DIAGNOSIS — R55 Syncope and collapse: Secondary | ICD-10-CM

## 2022-12-23 DIAGNOSIS — I480 Paroxysmal atrial fibrillation: Secondary | ICD-10-CM | POA: Diagnosis not present

## 2022-12-29 DIAGNOSIS — I10 Essential (primary) hypertension: Secondary | ICD-10-CM | POA: Diagnosis not present

## 2022-12-29 DIAGNOSIS — Z681 Body mass index (BMI) 19 or less, adult: Secondary | ICD-10-CM | POA: Diagnosis not present

## 2022-12-30 DIAGNOSIS — M25561 Pain in right knee: Secondary | ICD-10-CM | POA: Diagnosis not present

## 2022-12-30 DIAGNOSIS — M25562 Pain in left knee: Secondary | ICD-10-CM | POA: Diagnosis not present

## 2022-12-31 ENCOUNTER — Ambulatory Visit: Payer: Medicare PPO | Attending: Internal Medicine | Admitting: Internal Medicine

## 2022-12-31 ENCOUNTER — Encounter: Payer: Self-pay | Admitting: Internal Medicine

## 2022-12-31 VITALS — BP 122/82 | HR 86 | Ht 67.0 in | Wt 111.4 lb

## 2022-12-31 DIAGNOSIS — E039 Hypothyroidism, unspecified: Secondary | ICD-10-CM

## 2022-12-31 DIAGNOSIS — D649 Anemia, unspecified: Secondary | ICD-10-CM | POA: Diagnosis not present

## 2022-12-31 DIAGNOSIS — Z79899 Other long term (current) drug therapy: Secondary | ICD-10-CM

## 2022-12-31 DIAGNOSIS — I480 Paroxysmal atrial fibrillation: Secondary | ICD-10-CM

## 2022-12-31 DIAGNOSIS — I10 Essential (primary) hypertension: Secondary | ICD-10-CM | POA: Diagnosis not present

## 2022-12-31 DIAGNOSIS — N289 Disorder of kidney and ureter, unspecified: Secondary | ICD-10-CM

## 2022-12-31 NOTE — Patient Instructions (Addendum)
Medication Instructions:   *If you need a refill on your cardiac medications before your next appointment, please call your pharmacy*   Lab Work: CMET, TSH, CBC  If you have labs (blood work) drawn today and your tests are completely normal, you will receive your results only by: MyChart Message (if you have MyChart) OR A paper copy in the mail If you have any lab test that is abnormal or we need to change your treatment, we will call you to review the results.   Testing/Procedures:    Follow-Up: At [facility], you and your health needs are our priority.  As part of our continuing mission to provide you with exceptional heart care, we have created designated Provider Care Teams.  These Care Teams include your primary Cardiologist (physician) and Advanced Practice Providers (APPs -  Physician Assistants and Nurse Practitioners) who all work together to provide you with the care you need, when you need it.  We recommend signing up for the patient portal called "MyChart".  Sign up information is provided on this After Visit Summary.  MyChart is used to connect with patients for Virtual Visits (Telemedicine).  Patients are able to view lab/test results, encounter notes, upcoming appointments, etc.  Non-urgent messages can be sent to your provider as well.   To learn more about what you can do with MyChart, go to [email or web address].    Your next appointment:  4 MONTHS WITH DR Tenny Craw

## 2022-12-31 NOTE — Progress Notes (Unsigned)
Cardiology Office Note   Date:  12/31/2022   ID:  Amy [name], DOB [birth year], MRN [id number]  PCP:  Pincus Sanes, MD  Cardiologist:   Dietrich Pates, MD    Pt presents for follow up of atrial fib, HTN and syncope  History of Present Illness: Amy [name] is a 87 y.o. female with a history of atrial flutter (s/p ablation 2008), atrial fibrillation, syncope, HTN, RAS (s/p PTA/stent to R renal artery Jan 2023) Oct 2023   ER visit for severe HTN  USN of renal arteries showed >60% R renal artery stenosis. -Jan 2024  SBP 120-150   Amldodipine decreased to 5 mg ; Pt switched from losartan to olmesartan 40mg   -Feb 14, 2022:  ER visit for severe BP elevation.  Given clonidine and IV fluids    In SR at times  On walking out of ER, pt  had a syncopal spell   No prodome.   Suffered maxillary, orbital and L olecranon fracture    Admitted for BP control     -Feb 2024  Seen by Margaretha Glassing   BP 126  P 50  (on amlodipine 2.5; clonidine 0.1 tid; hydralazine 100 tid; losartan 50 bid).  Carotid USN mild CVdz on R carotid  Zio patch showed afib 91% time  Average HR 77 bpm -March 2024  Seen by Rosette Reveal   ILR placed    -May 2024   [name]   Suffered pelvic fracture    Worsened with ramus and other fractures    Underwnt surgical repair. - Pt seen BP 158/80   Interrogation of ILR showed very high rates   Based on this pt started on amiodarone 200 mg bid  I saw the pt in Sept 2024     Bp at home very labile  110s to 180s   Mostly in 140s    Couple days 170s     Has taken 2.5 mg amlodpine if BP over 160     Denies palpitations  Does brace herself to get dressed    No presyncope / syncope Outpatient Medications Prior to Visit  Medication Sig Dispense Refill   acetaminophen (TYLENOL) 500 MG tablet Take 2 tablets (1,000 mg total) by mouth every 6 (six) hours. 30 tablet 0   ALPRAZolam (XANAX) 0.25 MG tablet Take 1 tablet (0.25 mg total) by mouth 2 (two) times daily as needed for anxiety. 20 tablet 0   amiodarone  (PACERONE) 200 MG tablet Take 1 tablet (200 mg total) by mouth 2 (two) times daily. 180 tablet 3   amLODipine (NORVASC) 2.5 MG tablet Take 1 tablet (2.5 mg total) by mouth daily. 90 tablet 3   atenolol (TENORMIN) 25 MG tablet Take 1 tablet (25 mg total) by mouth daily. 90 tablet 1   Calcium Carbonate-Vitamin D (CALCIUM + D PO) Take 1 tablet by mouth daily.     latanoprost (XALATAN) 0.005 % ophthalmic solution Place 1 drop into both eyes at bedtime. 2.5 mL 0   levothyroxine (SYNTHROID) 50 MCG tablet TAKE 1 TABLET BY MOUTH EVERY DAY 90 tablet 3   linaclotide (LINZESS) 72 MCG capsule Take 1 capsule (72 mcg total) by mouth as needed (for constipation). 30 capsule 0   melatonin 5 MG TABS Take 5 mg by mouth at bedtime.     methocarbamol (ROBAXIN) 500 MG tablet Take 1 tablet (500 mg total) by mouth 3 (three) times daily. (Patient taking differently: Take 500 mg by mouth as needed for muscle  spasms.) 90 tablet 0   polyethylene glycol (MIRALAX / GLYCOLAX) 17 g packet Take 17 g by mouth 2 (two) times daily. 14 each 0   Rivaroxaban (XARELTO) 15 MG TABS tablet Take 1 tablet (15 mg total) by mouth daily with supper. (Patient taking differently: Take 15 mg by mouth daily with supper. Pt takes every other day) 42 tablet    sertraline (ZOLOFT) 25 MG tablet Take 1 tablet (25 mg total) by mouth daily.     traMADol (ULTRAM) 50 MG tablet TAKE 1 TABLET BY MOUTH EVERY 8 HOURS AS NEEDED 90 tablet 0   No facility-administered medications prior to visit.     Allergies:   Patient has no known allergies.   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   Right Malar Cheek   Renal artery stenosis (HCC)     Past Surgical History:  Procedure Laterality Date   A FLUTTER ABLATION      KNEE ARTHROSCOPY Right    ORIF PELVIC FRACTURE WITH PERCUTANEOUS SCREWS N/A 07/19/2022   Procedure: ORIF PELVIC FRACTURE WITH PERCUTANEOUS SCREWS;  Surgeon: Roby Lofts, MD;  Location: [facility] OR;  Service: Orthopedics;  Laterality: N/A;   PERIPHERAL VASCULAR INTERVENTION Right 02/13/2021   Procedure: PERIPHERAL VASCULAR INTERVENTION;  Surgeon: Nada Libman, MD;  Location: [facility] INVASIVE CV LAB;  Service: Cardiovascular;  Laterality: Right;   RENAL ANGIOGRAPHY N/A 02/13/2021   Procedure: RENAL ANGIOGRAPHY;  Surgeon: Nada Libman, MD;  Location: [facility];  Service: Cardiovascular;  Laterality: N/A;   TUBAL LIGATION       Social History:  The patient  reports that she has never smoked. She has never been exposed to tobacco smoke. She has never used smokeless tobacco. She reports that she does not drink alcohol and does not use drugs.   Family History:  The patient's family history includes Colon cancer in her brother; Heart disease in her mother; Stroke (age of onset: 61) in her mother; Stroke (age of onset: 61) in her father.    ROS:  Please see the history of present illness. All other systems are reviewed and  Negative to the above problem except as noted.    PHYSICAL EXAM: VS:  BP 122/82   Pulse 86   Ht 5\' 7"  (1.702 m)   Wt 111 lb 6.4 oz (50.5 kg)   LMP  (LMP Unknown)   SpO2 96%   BMI 17.45 kg/m      GEN: Thin 87 yo who is in NAD  Examined in chair   Neck: JVP is not elevated     Cardiac:Irreg irreg ; no murmur  No LE edema  Respiratory:  clear to auscultation bilaterally    EKG:  EKG not done  today   Event monitor   Jan 2024  Predominant rhythm:  Atrial fibrillation 91% of time   Rates 47 to 133 bpm   Average HR 77 bpm   Rare PVC  Carotid USN  Jan 2024  Right Carotid: Velocities in the right ICA are consistent with a 1-39%  stenosis.  Left Carotid: There is no evidence of stenosis in the left ICA. The  extracranial vessels were near-normal  with only  minimal wall thickening or plaque.  Vertebrals:  Bilateral vertebral arteries demonstrate antegrade flow.  Subclavians: Normal flow hemodynamics were seen in bilateral subclavian arteries.    Echo   02/15/22 1. Left ventricular ejection fraction, by estimation, is 65 to 70%. The  left ventricle has normal function. The left ventricle has no regional  wall motion abnormalities. Left ventricular diastolic parameters are  consistent with Grade II diastolic  dysfunction (pseudonormalization).   2. Right ventricular systolic function is normal. The right ventricular  size is normal. There is moderately elevated pulmonary artery systolic  pressure. The estimated right ventricular systolic pressure is 54.0 mmHg.   3. Left atrial size was moderately dilated.   4. A small pericardial effusion is present. The pericardial effusion is  posterior to the left ventricle.   5. The mitral valve is degenerative. Mild mitral valve regurgitation.   6. Tricuspid valve regurgitation is mild to moderate.   7. The aortic valve is tricuspid. There is mild calcification of the  aortic valve. Aortic valve regurgitation is trivial. Aortic valve  sclerosis/calcification is present, without any evidence of aortic  stenosis.   8. The inferior vena cava is normal in size with greater than 50%  respiratory variability, suggesting right atrial pressure of 3 mmHg.   Renal Artery [place] 11/20/21 Summary:  Renal:    Right: Evidence of a > 60% stenosis of the right renal artery. RRV         flow present. Abnormal size for the right kidney. Abnormal         right Resistive Index. Normal cortical thickness of right         kidney.  Left:  No evidence of left renal artery stenosis. LRV flow present.         Abnormal size for the left kidney. Abnormal left Resisitve         Index. Normal cortical thickness of the left kidney.  Mesenteric:  Normal Celiac artery findings. 70 to 99% stenosis in the superior  mesenteric   artery. Areas of limited visceral study include right kidney size, left  kidney  size and right parenchymal flow.    Summary: Renal: Right: Abnormal right Resistive Index. Normal cortical thickness of right kidney. Probable occlusion of the right renal artery. Left: Abnormal left Resisitve Index. Normal cortical thickness of the left kidney. Normal size of left kidney. No evidence of left renal artery stenosis. LRV flow present. Mesenteric: Normal Celiac artery findings. 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   Lipid Panel    Component Value Date/Time   CHOL 196 05/01/2022 1450   CHOL 219 (H) 10/12/2020 1617   TRIG 96.0 05/01/2022 1450   HDL 93.50 05/01/2022 1450   HDL 95 10/12/2020 1617   CHOLHDL 2 05/01/2022 1450   VLDL 19.2 05/01/2022 1450   LDLCALC 83 05/01/2022 1450   LDLCALC 109 (H) 10/12/2020 1617   LDLDIRECT 84.9 11/15/2008 0924      Wt Readings from Last 3 Encounters:  12/31/22 111 lb 6.4 oz (50.5 kg)  10/04/22 100 lb 9.6 oz (45.6 kg)  09/04/22 107 lb 12.8 oz (48.9 kg)      ASSESSMENT AND PLAN:  1  Atrial fibrllation  Remains on amiodarone 200 bid   Review with EP taper Continue Xarelto 15 mg     2  HTN  BP remains very labile   Take amlodipine for SBP over 160/   Follow     3  Syncope   One spell when in ER at [facility]   No prodrome  ILR in place   4   Lipids    Lipids in April 2024 LDL 83  HDL 94   Check CBC, TSH, CMET today   Encouraged her to do some exercises at home    up/down from chair, lifting heels, toes       Current medicines are reviewed at length with the patient today.  The patient does not have concerns regarding medicines.  Signed, Dietrich Pates, MD  12/31/2022 2:09 PM    [facility] [address] [place], [place], [place]  [address] Phone: [phone]; Fax: [phone]

## 2023-01-01 ENCOUNTER — Encounter: Payer: Self-pay | Admitting: Internal Medicine

## 2023-01-01 LAB — CBC
Hematocrit: 36 % (ref 34.0–46.6)
Hemoglobin: 11.9 g/dL (ref 11.1–15.9)
MCH: 32.7 pg (ref 26.6–33.0)
MCHC: 33.1 g/dL (ref 31.5–35.7)
MCV: 99 fL — ABNORMAL HIGH (ref 79–97)
Platelets: 318 10*3/uL (ref 150–450)
RBC: 3.64 x10E6/uL — ABNORMAL LOW (ref 3.77–5.28)
RDW: 12.9 % (ref 11.7–15.4)
WBC: 13.8 10*3/uL — ABNORMAL HIGH (ref 3.4–10.8)

## 2023-01-01 LAB — COMPREHENSIVE METABOLIC PANEL
ALT: 32 [IU]/L (ref 0–32)
AST: 21 [IU]/L (ref 0–40)
Albumin: 4.6 g/dL (ref 3.7–4.7)
Alkaline Phosphatase: 78 [IU]/L (ref 44–121)
BUN/Creatinine Ratio: 18 (ref 12–28)
BUN: 28 mg/dL — ABNORMAL HIGH (ref 8–27)
Bilirubin Total: 0.4 mg/dL (ref 0.0–1.2)
CO2: 21 mmol/L (ref 20–29)
Calcium: 10.1 mg/dL (ref 8.7–10.3)
Chloride: 101 mmol/L (ref 96–106)
Creatinine, Ser: 1.56 mg/dL — ABNORMAL HIGH (ref 0.57–1.00)
Globulin, Total: 2.6 g/dL (ref 1.5–4.5)
Glucose: 118 mg/dL — ABNORMAL HIGH (ref 70–99)
Potassium: 4.6 mmol/L (ref 3.5–5.2)
Sodium: 138 mmol/L (ref 134–144)
Total Protein: 7.2 g/dL (ref 6.0–8.5)
eGFR: 32 mL/min/{1.73_m2} — ABNORMAL LOW (ref >59)

## 2023-01-01 LAB — TSH: TSH: 3.57 u[IU]/mL (ref 0.450–4.500)

## 2023-01-02 ENCOUNTER — Other Ambulatory Visit: Payer: Self-pay

## 2023-01-02 DIAGNOSIS — D649 Anemia, unspecified: Secondary | ICD-10-CM

## 2023-01-02 DIAGNOSIS — I480 Paroxysmal atrial fibrillation: Secondary | ICD-10-CM

## 2023-01-02 DIAGNOSIS — Z79899 Other long term (current) drug therapy: Secondary | ICD-10-CM

## 2023-01-02 DIAGNOSIS — N289 Disorder of kidney and ureter, unspecified: Secondary | ICD-10-CM

## 2023-01-02 DIAGNOSIS — I10 Essential (primary) hypertension: Secondary | ICD-10-CM

## 2023-01-02 MED ORDER — AMIODARONE HCL 200 MG PO TABS: 200.0000 mg | ORAL_TABLET | Freq: Every day | ORAL | 3 refills | Status: DC

## 2023-01-03 ENCOUNTER — Encounter: Payer: Self-pay | Admitting: Internal Medicine

## 2023-01-03 NOTE — Telephone Encounter (Signed)
Spoke with patient concerning blood pressure. She wrote in this morning stating her BP was 163/86 but this was prior to medication, 20 minutes after taking her morning medication she rechecked her BP, now measuring 148/75. I instructed patient to take BP 2 hours after medication, if systolic higher than 155, she may take an additional dose of amlodipine per Dr Tenny Craw on 12/11 - Patient states she understood and will contact our office for any further needs.       Frutoso Schatz, RN to Amy [name]     01/01/23  4:43 PM Hi [name],    This is what Dr. Tenny Craw said:    If BP is over 155 I would take 2.5 mg amlodipine    If BP is still high at 2 hours after can take an additional amlodipine

## 2023-01-07 DIAGNOSIS — D649 Anemia, unspecified: Secondary | ICD-10-CM | POA: Diagnosis not present

## 2023-01-07 DIAGNOSIS — Z79899 Other long term (current) drug therapy: Secondary | ICD-10-CM | POA: Diagnosis not present

## 2023-01-07 DIAGNOSIS — I480 Paroxysmal atrial fibrillation: Secondary | ICD-10-CM | POA: Diagnosis not present

## 2023-01-07 DIAGNOSIS — I10 Essential (primary) hypertension: Secondary | ICD-10-CM | POA: Diagnosis not present

## 2023-01-07 DIAGNOSIS — N289 Disorder of kidney and ureter, unspecified: Secondary | ICD-10-CM | POA: Diagnosis not present

## 2023-01-08 ENCOUNTER — Encounter: Payer: Self-pay | Admitting: Internal Medicine

## 2023-01-09 LAB — URINALYSIS, ROUTINE W REFLEX MICROSCOPIC

## 2023-01-09 LAB — CBC
Hematocrit: 36.8 % (ref 34.0–46.6)
Hemoglobin: 11.7 g/dL (ref 11.1–15.9)
MCH: 32.6 pg (ref 26.6–33.0)
MCHC: 31.8 g/dL (ref 31.5–35.7)
MCV: 103 fL — ABNORMAL HIGH (ref 79–97)
Platelets: 226 10*3/uL (ref 150–450)
RBC: 3.59 x10E6/uL — ABNORMAL LOW (ref 3.77–5.28)
RDW: 13.8 % (ref 11.7–15.4)
WBC: 11.2 10*3/uL — ABNORMAL HIGH (ref 3.4–10.8)

## 2023-01-16 ENCOUNTER — Ambulatory Visit: Payer: Self-pay | Admitting: Internal Medicine

## 2023-01-16 NOTE — Telephone Encounter (Signed)
  Chief Complaint: BLE edema Symptoms: bilateral ankle and foot swelling, redness Frequency: 3 days Pertinent Negatives: Patient denies SOB, pain, streaking, injury, wound Disposition: [] ED /[] Urgent Care (no appt availability in office) / [x] Appointment(In office/virtual)/ []  [facility]/ [] Home Care/ [] Refused Recommended Disposition /[] [facility]/ []  Follow-up with PCP Additional Notes: Pt calls stating she has had BLE edema x 3 days. States it is her ankles and feet, she is unable to put on shoes. States that the top of her feet is red and pink/white. Denies streaking, warmth, numbness or tingling, SOB. Pt reports she did cut her L leg several weeks ago and has a bruise on that leg. States the L is mildly larger than the right side. Per protocol, pt to be evaluated within 3 days. Next available with PCP is 1/6. Able to schedule for 12/27 at 1500 with alternate provider. Care advice reviewed, verbalized understanding, denies further questions. Alerting PCP for review.   Copied from CRM [id number]. Topic: Clinical - Red Word Triage >> Jan 16, 2023  3:29 PM Corin V wrote: Red Word that prompted transfer to Nurse Triage: Ankles red and swollen on Christmas eve and rash on left leg. Reason for Disposition  [1] MILD swelling of both ankles (i.e., pedal edema) AND [2] new-onset or worsening  Answer Assessment - Initial Assessment Questions 1. ONSET: "When did the swelling start?" (e.g., minutes, hours, days)     3 days ago 2. LOCATION: "What part of the leg is swollen?"  "Are both legs swollen or just one leg?"     Bilateral lower extremities, ankles and feet 3. SEVERITY: "How bad is the swelling?" (e.g., localized; mild, moderate, severe)   - Localized: Small area of swelling localized to one leg.   - MILD pedal edema: Swelling limited to foot and ankle, pitting edema < 1/4 inch (6 mm) deep, rest and elevation eliminate most or all swelling.   - MODERATE edema: Swelling of  lower leg to knee, pitting edema > 1/4 inch (6 mm) deep, rest and elevation only partially reduce swelling.   - SEVERE edema: Swelling extends above knee, facial or hand swelling present.      Moderate, cannot put on shoes 4. REDNESS: "Does the swelling look red or infected?"     Red on top of feet, close to toes pinkish white. 5. PAIN: "Is the swelling painful to touch?" If Yes, ask: "How painful is it?"   (Scale 1-10; mild, moderate or severe)     Denies pain 6. FEVER: "Do you have a fever?" If Yes, ask: "What is it, how was it measured, and when did it start?"      Denies 7. CAUSE: "What do you think is causing the leg swelling?"     Does not know 8. MEDICAL HISTORY: "Do you have a history of blood clots (e.g., DVT), cancer, heart failure, kidney disease, or liver failure?"     Denies 9. RECURRENT SYMPTOM: "Have you had leg swelling before?" If Yes, ask: "When was the last time?" "What happened that time?"     Never happened before 10. OTHER SYMPTOMS: "Do you have any other symptoms?" (e.g., chest pain, difficulty breathing)       denies  Protocols used: Leg Swelling and Edema-A-AH

## 2023-01-17 ENCOUNTER — Encounter: Payer: Self-pay | Admitting: Internal Medicine

## 2023-01-17 ENCOUNTER — Ambulatory Visit: Payer: Medicare PPO | Admitting: Internal Medicine

## 2023-01-17 VITALS — BP 126/74 | HR 60 | Temp 98.7°F | Ht 67.0 in | Wt 108.0 lb

## 2023-01-17 DIAGNOSIS — N1831 Chronic kidney disease, stage 3a: Secondary | ICD-10-CM | POA: Diagnosis not present

## 2023-01-17 DIAGNOSIS — S81812A Laceration without foreign body, left lower leg, initial encounter: Secondary | ICD-10-CM

## 2023-01-17 DIAGNOSIS — I10 Essential (primary) hypertension: Secondary | ICD-10-CM | POA: Diagnosis not present

## 2023-01-17 DIAGNOSIS — R739 Hyperglycemia, unspecified: Secondary | ICD-10-CM | POA: Diagnosis not present

## 2023-01-17 NOTE — Progress Notes (Unsigned)
Patient ID: Amy [name], female   DOB: [birth year], 87 y.o.   MRN: [id number]        Chief Complaint: follow up HTN, HLD and hyperglycemia ***       HPI:  Amy [name] is a 87 y.o. female here with c/o        Wt Readings from Last 3 Encounters:  01/17/23 108 lb (49 kg)  12/31/22 111 lb 6.4 oz (50.5 kg)  10/04/22 100 lb 9.6 oz (45.6 kg)   BP Readings from Last 3 Encounters:  01/17/23 126/74  12/31/22 122/82  10/04/22 132/80         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   Right Malar Cheek   Renal artery stenosis (HCC)    Past Surgical History:  Procedure Laterality Date   A FLUTTER ABLATION     KNEE ARTHROSCOPY Right    ORIF PELVIC FRACTURE WITH PERCUTANEOUS SCREWS N/A 07/19/2022   Procedure: ORIF PELVIC FRACTURE WITH PERCUTANEOUS SCREWS;  Surgeon: Roby Lofts, MD;  Location: [facility] OR;  Service: Orthopedics;  Laterality: N/A;   PERIPHERAL VASCULAR INTERVENTION Right 02/13/2021   Procedure: PERIPHERAL VASCULAR INTERVENTION;  Surgeon: Nada Libman, MD;  Location: [facility] INVASIVE CV LAB;  Service: Cardiovascular;  Laterality: Right;   RENAL ANGIOGRAPHY N/A 02/13/2021   Procedure: RENAL ANGIOGRAPHY;  Surgeon: Nada Libman, MD;  Location: [facility] INVASIVE CV LAB;  Service: Cardiovascular;  Laterality: N/A;   TUBAL LIGATION      reports that she has never smoked. She has never been exposed to tobacco smoke. She has never used smokeless tobacco. She reports that she does not drink alcohol and does not use drugs. family history includes Colon cancer in her brother; Heart disease in her mother; Stroke (age of onset: 7) in her mother; Stroke (age of onset: 31) in her father. No Known Allergies Current Outpatient Medications on File Prior to  Visit  Medication Sig Dispense Refill   acetaminophen (TYLENOL) 500 MG tablet Take 2 tablets (1,000 mg total) by mouth every 6 (six) hours. 30 tablet 0   ALPRAZolam (XANAX) 0.25 MG tablet Take 1 tablet (0.25 mg total) by mouth 2 (two) times daily as needed for anxiety. 20 tablet 0   amiodarone (PACERONE) 200 MG tablet Take 1 tablet (200 mg total) by mouth daily. 90 tablet 3   amLODipine (NORVASC) 2.5 MG tablet Take 1 tablet (2.5 mg total) by mouth daily. 90 tablet 3   atenolol (TENORMIN) 25 MG tablet Take 1 tablet (25 mg total) by mouth daily. 90 tablet 1   Calcium Carbonate-Vitamin D (CALCIUM + D PO) Take 1 tablet by mouth daily.     latanoprost (XALATAN) 0.005 % ophthalmic solution Place 1 drop into both eyes at bedtime. 2.5 mL 0   levothyroxine (SYNTHROID) 50 MCG tablet TAKE 1 TABLET BY MOUTH EVERY DAY 90 tablet 3   linaclotide (LINZESS) 72 MCG capsule Take 1 capsule (72 mcg total) by mouth as needed (for constipation). 30 capsule 0   melatonin 5 MG TABS Take 5 mg by mouth at bedtime.     methocarbamol (ROBAXIN) 500 MG tablet Take 1 tablet (500 mg total) by mouth 3 (  three) times daily. (Patient taking differently: Take 500 mg by mouth as needed for muscle spasms.) 90 tablet 0   polyethylene glycol (MIRALAX / GLYCOLAX) 17 g packet Take 17 g by mouth 2 (two) times daily. 14 each 0   Rivaroxaban (XARELTO) 15 MG TABS tablet Take 1 tablet (15 mg total) by mouth daily with supper. (Patient taking differently: Take 15 mg by mouth daily with supper. Pt takes every other day) 42 tablet    sertraline (ZOLOFT) 25 MG tablet Take 1 tablet (25 mg total) by mouth daily.     traMADol (ULTRAM) 50 MG tablet TAKE 1 TABLET BY MOUTH EVERY 8 HOURS AS NEEDED 90 tablet 0   No current facility-administered medications on file prior to visit.        ROS:  All others reviewed and negative.  Objective        PE:  BP 126/74 (BP Location: Right Arm, Patient Position: Sitting, Cuff Size: Normal)   Pulse 60   Temp 98.7  F (37.1 C) (Oral)   Ht 5\' 7"  (1.702 m)   Wt 108 lb (49 kg)   LMP  (LMP Unknown)   SpO2 98%   BMI 16.92 kg/m                 Constitutional: Pt appears in NAD               HENT: Head: NCAT.                Right Ear: External ear normal.                 Left Ear: External ear normal.                Eyes: . Pupils are equal, round, and reactive to light. Conjunctivae and EOM are normal               Nose: without d/c or deformity               Neck: Neck supple. Gross normal ROM               Cardiovascular: Normal rate and regular rhythm.                 Pulmonary/Chest: Effort normal and breath sounds without rales or wheezing.                Abd:  Soft, NT, ND, + BS, no organomegaly               Neurological: Pt is alert. At baseline orientation, motor grossly intact               Skin: Skin is warm. No rashes, no other new lesions, LE edema - ***               Psychiatric: Pt behavior is normal without agitation   Micro: none  Cardiac tracings I have personally interpreted today:  none  Pertinent Radiological findings (summarize): none   Lab Results  Component Value Date   WBC 11.2 (H) 01/07/2023   HGB 11.7 01/07/2023   HCT 36.8 01/07/2023   PLT 226 01/07/2023   GLUCOSE 118 (H) 12/31/2022   CHOL 196 05/01/2022   TRIG 96.0 05/01/2022   HDL 93.50 05/01/2022   LDLDIRECT 84.9 11/15/2008   LDLCALC 83 05/01/2022   ALT 32 12/31/2022   AST 21 12/31/2022   NA 138 12/31/2022   K 4.6 12/31/2022  CL 101 12/31/2022   CREATININE 1.56 (H) 12/31/2022   BUN 28 (H) 12/31/2022   CO2 21 12/31/2022   TSH 3.570 12/31/2022   INR 1.0 07/18/2022   HGBA1C 4.8 05/01/2022   Assessment/Plan:  Amy [name] is a 87 y.o. White or Caucasian [1] female with  has a past medical history of Anemia, Arthritis, Atrial fibrillation (HCC), Atrial flutter (HCC), Atypical mole (09/07/2013), HTN (hypertension), Hypothyroidism, Nodular basal cell carcinoma (BCC) (03/29/2020), and Renal artery  stenosis (HCC).  No problem-specific Assessment & Plan notes found for this encounter.  Followup: No follow-ups on file.  Oliver Barre, MD 01/17/2023 3:40 PM  [facility] Internal Medicine

## 2023-01-19 ENCOUNTER — Encounter: Payer: Self-pay | Admitting: Internal Medicine

## 2023-01-19 DIAGNOSIS — S81812A Laceration without foreign body, left lower leg, initial encounter: Secondary | ICD-10-CM | POA: Insufficient documentation

## 2023-01-19 NOTE — Patient Instructions (Signed)
Please Cont treatment as recommeded

## 2023-01-19 NOTE — Assessment & Plan Note (Signed)
Lab Results  Component Value Date   CREATININE 1.56 (H) 12/31/2022   Stable overall, cont to avoid nephrotoxins

## 2023-01-19 NOTE — Assessment & Plan Note (Signed)
Benign appearance though does have admitteldly large area of echymosis related, no s/s infection, for neosporin and gauze covering  and should heal with expectant management; no imaging, tdap or antibx needed at this time; family reassured

## 2023-01-19 NOTE — Assessment & Plan Note (Signed)
Lab Results  Component Value Date   HGBA1C 4.8 05/01/2022   Stable, pt to continue current medical treatment  - diet, wt control

## 2023-01-19 NOTE — Assessment & Plan Note (Signed)
BP Readings from Last 3 Encounters:  01/17/23 126/74  12/31/22 122/82  10/04/22 132/80   Stable, pt to continue medical treatment norvasc 2.5 every day, temornim 25 qd

## 2023-01-21 DIAGNOSIS — H40012 Open angle with borderline findings, low risk, left eye: Secondary | ICD-10-CM | POA: Diagnosis not present

## 2023-01-21 DIAGNOSIS — H401111 Primary open-angle glaucoma, right eye, mild stage: Secondary | ICD-10-CM | POA: Diagnosis not present

## 2023-01-23 ENCOUNTER — Emergency Department (HOSPITAL_COMMUNITY)
Admission: EM | Admit: 2023-01-23 | Discharge: 2023-01-23 | Disposition: A | Discharge status: Home / Self Care | Payer: Medicare PPO | Attending: Emergency Medicine | Admitting: Emergency Medicine

## 2023-01-23 ENCOUNTER — Other Ambulatory Visit: Payer: Self-pay

## 2023-01-23 ENCOUNTER — Emergency Department (HOSPITAL_COMMUNITY): Payer: Medicare PPO

## 2023-01-23 DIAGNOSIS — S2241XA Multiple fractures of ribs, right side, initial encounter for closed fracture: Secondary | ICD-10-CM | POA: Diagnosis not present

## 2023-01-23 DIAGNOSIS — E039 Hypothyroidism, unspecified: Secondary | ICD-10-CM | POA: Diagnosis not present

## 2023-01-23 DIAGNOSIS — Z7901 Long term (current) use of anticoagulants: Secondary | ICD-10-CM | POA: Insufficient documentation

## 2023-01-23 DIAGNOSIS — R079 Chest pain, unspecified: Secondary | ICD-10-CM | POA: Diagnosis not present

## 2023-01-23 DIAGNOSIS — N189 Chronic kidney disease, unspecified: Secondary | ICD-10-CM | POA: Insufficient documentation

## 2023-01-23 DIAGNOSIS — W0110XA Fall on same level from slipping, tripping and stumbling with subsequent striking against unspecified object, initial encounter: Secondary | ICD-10-CM | POA: Diagnosis not present

## 2023-01-23 DIAGNOSIS — R0781 Pleurodynia: Secondary | ICD-10-CM | POA: Diagnosis present

## 2023-01-23 DIAGNOSIS — Y92002 Bathroom of unspecified non-institutional (private) residence single-family (private) house as the place of occurrence of the external cause: Secondary | ICD-10-CM | POA: Diagnosis not present

## 2023-01-23 MED ORDER — LIDOCAINE 5 % EX PTCH
1.0000 | MEDICATED_PATCH | CUTANEOUS | Status: DC
  Administered 2023-01-23: 1 via TRANSDERMAL
  Filled 2023-01-23: qty 1

## 2023-01-23 MED ORDER — LIDOCAINE 5 % EX PTCH: 1.0000 | MEDICATED_PATCH | CUTANEOUS | 0 refills | Status: DC

## 2023-01-23 MED ORDER — OXYCODONE HCL 5 MG PO TABS: 5.0000 mg | ORAL_TABLET | Freq: Four times a day (QID) | ORAL | 0 refills | Status: AC | PRN

## 2023-01-23 NOTE — ED Notes (Signed)
 Pt d/c home per EDP order. Discharge summary reviewed, verbalize understanding. Off unit via WC. D/C home with visitor. NAD.

## 2023-01-23 NOTE — Discharge Instructions (Addendum)
 Please follow-up with your orthopedist in regards to recent symptoms and ER visit.  Today your x-ray shows you have fractures in your fourth and fifth ribs on the right side.  You may use the lidocaine  patches I have given you along with Tylenol  every 6 hours and pain not controlled by this you may use the oxycodone  I have prescribed for you.  Please use the incentive spirometer that we have given you at least 10 times a day and read the pamphlet on how to use it.  If symptoms change or worsen please return to the ER.

## 2023-01-23 NOTE — ED Triage Notes (Signed)
 Pt. Stated, I fell yesterday morning in the bathroom and hit the right side of my ribs,, on blood thinners . Im hurting were my ribs are on the right side underneath my right breast.

## 2023-01-23 NOTE — ED Provider Notes (Signed)
  EMERGENCY DEPARTMENT AT [facility] Provider Note   CSN: [id number] Arrival date & time: 01/23/23  9078     History  Chief Complaint  Patient presents with   Fall   right side pain    Amy [name] is a 88 y.o. female history of A-fib on Xarelto , hypothyroidism, CKD, anxiety presented with right rib pain that began yesterday morning after she slipped and landed on her right ribs in the bathroom.  Patient denies abdominal pain dysuria/hematuria nausea vomiting and shortness of breath along with chest pain states her right ribs hurt and that palpation of the ribs and movement of the ribs exacerbates his pain.  Patient states that she takes her Xarelto  every other day but denies any other symptoms.  Patient denies any recent illnesses or chest pain or shortness of breath or LOC or injury to the head or neck area during her fall and states she is just here for her right ribs.  Patient recently had a pelvic fracture and was seeing Dr. Franky Haddix from Orthopedics.  He states that since her pelvic fracture she has not been able to walk very well and thinks that this may contributed to the fall.  Home Medications Prior to Admission medications   Medication Sig Start Date End Date Taking? Authorizing Provider  acetaminophen  (TYLENOL ) 500 MG tablet Take 2 tablets (1,000 mg total) by mouth every 6 (six) hours. 07/22/22   Vann, Jessica U, DO  ALPRAZolam  (XANAX ) 0.25 MG tablet Take 1 tablet (0.25 mg total) by mouth 2 (two) times daily as needed for anxiety. 08/09/22   Crain, Whitney L, PA  amiodarone  (PACERONE ) 200 MG tablet Take 1 tablet (200 mg total) by mouth daily. 01/02/23   Okey Vina GAILS, MD  amLODipine  (NORVASC ) 2.5 MG tablet Take 1 tablet (2.5 mg total) by mouth daily. 09/03/22   Okey Vina GAILS, MD  atenolol  (TENORMIN ) 25 MG tablet Take 1 tablet (25 mg total) by mouth daily. 08/15/22   Okey Vina GAILS, MD  Calcium  Carbonate-Vitamin D  (CALCIUM  + D PO) Take 1 tablet by mouth daily.     [provider]  latanoprost  (XALATAN ) 0.005 % ophthalmic solution Place 1 drop into both eyes at bedtime. 07/05/22   Landy Barnie RAMAN, NP  levothyroxine  (SYNTHROID ) 50 MCG tablet TAKE 1 TABLET BY MOUTH EVERY DAY 11/06/22   Okey Vina GAILS, MD  linaclotide  (LINZESS ) 72 MCG capsule Take 1 capsule (72 mcg total) by mouth as needed (for constipation). 07/05/22   Landy Barnie RAMAN, NP  melatonin 5 MG TABS Take 5 mg by mouth at bedtime.    [provider]  methocarbamol  (ROBAXIN ) 500 MG tablet Take 1 tablet (500 mg total) by mouth 3 (three) times daily. Patient taking differently: Take 500 mg by mouth as needed for muscle spasms. 07/05/22   Landy Barnie RAMAN, NP  polyethylene glycol (MIRALAX  / GLYCOLAX ) 17 g packet Take 17 g by mouth 2 (two) times daily. 07/22/22   Vann, Jessica U, DO  Rivaroxaban  (XARELTO ) 15 MG TABS tablet Take 1 tablet (15 mg total) by mouth daily with supper. Patient taking differently: Take 15 mg by mouth daily with supper. Pt takes every other day 07/22/22   Vann, Jessica U, DO  sertraline  (ZOLOFT ) 25 MG tablet Take 1 tablet (25 mg total) by mouth daily. 10/09/22   Geofm Glade PARAS, MD  traMADol  (ULTRAM ) 50 MG tablet TAKE 1 TABLET BY MOUTH EVERY 8 HOURS AS NEEDED 10/31/22   Geofm Glade  J, MD      Allergies    Patient has no known allergies.    Review of Systems   Review of Systems  Physical Exam Updated Vital Signs BP (!) 149/76 (BP Location: Right Arm)   Pulse 61   Temp 98.1 F (36.7 C) (Oral)   Resp 16   LMP  (LMP Unknown)   SpO2 97%  Physical Exam Vitals reviewed.  Constitutional:      General: She is not in acute distress. HENT:     Head: Normocephalic and atraumatic.  Eyes:     Extraocular Movements: Extraocular movements intact.     Conjunctiva/sclera: Conjunctivae normal.     Pupils: Pupils are equal, round, and reactive to light.  Cardiovascular:     Rate and Rhythm: Normal rate and regular rhythm.     Pulses: Normal pulses.     Heart sounds:  Normal heart sounds.     Comments: 2+ bilateral radial/dorsalis pedis pulses with regular rate Pulmonary:     Effort: Pulmonary effort is normal. No respiratory distress.     Breath sounds: Normal breath sounds.  Abdominal:     Palpations: Abdomen is soft.     Tenderness: There is no abdominal tenderness. There is no guarding or rebound.  Musculoskeletal:        General: Normal range of motion.     Cervical back: Normal range of motion and neck supple.     Comments: 4 out of 5 bilateral grip/leg extension strength, baseline since her pelvic surgery Right ribs tender to palpation without any step-offs or tenting noted No signs of open wounds  Skin:    General: Skin is warm and dry.     Capillary Refill: Capillary refill takes less than 2 seconds.  Neurological:     General: No focal deficit present.     Mental Status: She is alert and oriented to person, place, and time.     Sensory: Sensation is intact.     Motor: Motor function is intact.     Coordination: Coordination is intact.     Comments: Sensation intact in all 4 limbs Cranial nerves III through XII intact Vision grossly intact  Psychiatric:        Mood and Affect: Mood normal.     ED Results / Procedures / Treatments   Labs (all labs ordered are listed, but only abnormal results are displayed) Labs Reviewed - No data to display  EKG None  Radiology DG Ribs Unilateral W/Chest Right Result Date: 01/23/2023 CLINICAL DATA:  Fall.  Right anterior mid chest pain. EXAM: RIGHT RIBS AND CHEST - 3+ VIEW COMPARISON:  None Available. FINDINGS: Bilateral lungs appear hyperexpanded and hyperlucent with coarse bronchovascular markings, in keeping with COPD. Bilateral lungs otherwise appear clear. No dense consolidation or lung collapse. Right lateral costophrenic angle is clear. There is subtle blunting of left lateral costophrenic angle, which may be due to pleural thickening versus trace pleural effusion. Normal cardio-mediastinal  silhouette. Presumed loop recorder device noted overlying the left chest wall. There is old healed fracture of anterior right third rib. There are new mildly displaced fractures of anterolateral right fourth and fifth ribs. The soft tissues are within normal limits. IMPRESSION: *Mildly displaced fractures of anterolateral right fourth and fifth ribs. *Other nonemergent observations, as described above. Electronically Signed   By: Ree Molt M.D.   On: 01/23/2023 11:22    Procedures Procedures    Medications Ordered in ED Medications - No data to display  ED Course/ Medical Decision Making/ A&P                                 Medical Decision Making Amount and/or Complexity of Data Reviewed Radiology: ordered.   Amy [name] 88 y.o. presented today for fall. Working DDx that I considered at this time includes, but not limited to, vasovagal episode, mechanical fall, ICH, epidural/subdural hematoma, basilar skull fracture, anemia, electrolyte abnormalities, drug-induced, arrhythmia, UTI, fracture, contusion, soft tissue injury.  R/o DDx: vasovagal episode, ICH, epidural/subdural hematoma, basilar skull fracture, anemia, electrolyte abnormalities, drug-induced, arrhythmia, UTI, contusion, soft tissue injury: These are considered less likely due to history of present illness, physical exam, lab/imaging findings  Review of prior external notes: 07/19/2022 surgery  Unique Tests and My Interpretation:  Right rib x-ray: Mildly displaced fracture to fourth and fifth ribs  Social Determinants of Health: none  Discussion with Independent Historian:  Husband  Discussion of Management of Tests: None  Risk: Medium: prescription drug management  Risk Stratification Score: none  Staffed with Trifan, MD  Plan: On exam patient was no acute distress stable vitals.  Patient does have tenderness to right ribs without concerning features otherwise had reassuring neurologic and physical exam.   X-ray does show mildly displaced fractures in the fourth and fifth rib.  Patient not have any abdominal tenderness and was not endorsing any other symptoms and after discussion with the patient patient declines further workup for fall or any further imaging.  Patient is full decision made capacity and understands that this does limit the evaluation and would like to proceed without any further imaging like to follow-up with orthopedist.  Will give patient lidocaine  patch here and prescribe this along with few days worth of pain medications have her follow-up with her orthopedist.  Patient's blood pressure while in the room went from 1 60-2 05 and spent 5 minutes however patient is not endorsing any symptoms today could have of endorgan damage or hypertensive emergency.  I do suspect with patient resting calmly on the bed that this is an accurate reading and will repeat check.  I spoke to the patient patient states she does not want to be worked up for her blood pressure states she wants to go home and take her blood pressure medications as she states that she has her blood pressure under control.  I spoke to the patient about the risks of elevated blood pressure including strokes, MIs, etc. patient verbalized understanding of this.  Will recheck blood pressure and give incentive spirometer at the patient follow-up with orthopedics.  At time of discharge patient's blood pressure in the room with me was 169 systolic and patient was asymptomatic besides the right rib pain.  Patient was given return precautions. Patient stable for discharge at this time.  Patient verbalized understanding of plan.  This chart was dictated using voice recognition software.  Despite best efforts to proofread,  errors can occur which can change the documentation meaning.         Final Clinical Impression(s) / ED Diagnoses Final diagnoses:  None    Rx / DC Orders ED Discharge Orders     None         Amy [name] 01/23/23 1631    Cottie Amy PARAS, MD 01/23/23 2252

## 2023-01-24 ENCOUNTER — Other Ambulatory Visit: Payer: Self-pay | Admitting: Physician Assistant

## 2023-01-24 ENCOUNTER — Other Ambulatory Visit: Payer: Self-pay | Admitting: Internal Medicine

## 2023-01-24 ENCOUNTER — Telehealth: Payer: Self-pay

## 2023-01-24 NOTE — Transitions of Care (Post Inpatient/ED Visit) (Signed)
 01/24/2023  Name: Amy [name] MRN: [id number] DOB: 02-21-1935  Today's TOC FU Call Status: Today's TOC FU Call Status:: Successful TOC FU Call Completed TOC FU Call Complete Date: 01/24/23 Patient's Name and Date of Birth confirmed.  Transition Care Management Follow-up Telephone Call Date of Discharge: 01/23/23 Discharge Facility: [facility]) Type of Discharge: Emergency Department Reason for ED Visit: Other: (fractured rib) How have you been since you were released from the hospital?: Better Any questions or concerns?: No  Items Reviewed: Did you receive and understand the discharge instructions provided?: Yes Medications obtained,verified, and reconciled?: Yes (Medications Reviewed) Any new allergies since your discharge?: No Dietary orders reviewed?: Yes Do you have support at home?: Yes People in Home: spouse  Medications Reviewed Today: Medications Reviewed Today     Reviewed by Emmitt Pan, LPN (Licensed Practical Nurse) on 01/24/23 at 1053  Med List Status: <None>   Medication Order Taking? Sig Documenting Provider Last Dose Status Informant  acetaminophen  (TYLENOL ) 500 MG tablet [id number] No Take 2 tablets (1,000 mg total) by mouth every 6 (six) hours. Vann, Jessica U, DO Taking Active   ALPRAZolam  (XANAX ) 0.25 MG tablet [id number] No Take 1 tablet (0.25 mg total) by mouth 2 (two) times daily as needed for anxiety. Crain, Whitney L, PA Taking Active   amiodarone  (PACERONE ) 200 MG tablet [id number] No Take 1 tablet (200 mg total) by mouth daily. Okey Vina GAILS, MD Taking Active   amLODipine  (NORVASC ) 2.5 MG tablet [id number] No Take 1 tablet (2.5 mg total) by mouth daily. Okey Vina GAILS, MD Taking Active   atenolol  (TENORMIN ) 25 MG tablet [id number]  TAKE 1 TABLET (25 MG TOTAL) BY MOUTH DAILY. Okey Vina GAILS, MD  Active   Calcium  Carbonate-Vitamin D  (CALCIUM  + D PO) [id number] No Take 1 tablet by mouth daily. [provider] Taking Active Family Member,  Pharmacy Records  latanoprost  (XALATAN ) 0.005 % ophthalmic solution [id number] No Place 1 drop into both eyes at bedtime. Landy Barnie RAMAN, NP Taking Active Family Member, Pharmacy Records  levothyroxine  (SYNTHROID ) 50 MCG tablet [id number] No TAKE 1 TABLET BY MOUTH EVERY DAY Okey Vina GAILS, MD Taking Active   lidocaine  (LIDODERM ) 5 % [id number]  Place 1 patch onto the skin daily. Remove & Discard patch within 12 hours or as directed by MD Victor Lynwood DASEN, PA-C  Active   linaclotide  (LINZESS ) 72 MCG capsule [id number] No Take 1 capsule (72 mcg total) by mouth as needed (for constipation). Landy Barnie RAMAN, NP Taking Active Family Member, Pharmacy Records  melatonin 5 MG TABS [id number] No Take 5 mg by mouth at bedtime. [provider] Taking Active Family Member, Pharmacy Records  methocarbamol  (ROBAXIN ) 500 MG tablet [id number] No Take 1 tablet (500 mg total) by mouth 3 (three) times daily.  Patient taking differently: Take 500 mg by mouth as needed for muscle spasms.   Landy Barnie RAMAN, NP Taking Active Family Member, Pharmacy Records  oxyCODONE  (ROXICODONE ) 5 MG immediate release tablet [id number]  Take 1 tablet (5 mg total) by mouth every 6 (six) hours as needed for up to 3 days for severe pain (pain score 7-10). Victor Lynwood DASEN, PA-C  Active   polyethylene glycol (MIRALAX  / GLYCOLAX ) 17 g packet [id number] No Take 17 g by mouth 2 (two) times daily. Vann, Jessica U, DO Taking Active   Rivaroxaban  (XARELTO ) 15 MG TABS tablet [id number] No Take 1 tablet (15 mg total) by mouth daily with supper.  Patient taking differently: Take 15 mg  by mouth daily with supper. Pt takes every other day   Vann, Jessica U, DO Taking Active   sertraline  (ZOLOFT ) 25 MG tablet [id number] No Take 1 tablet (25 mg total) by mouth daily. Geofm Glade PARAS, MD Taking Active             Home Care and Equipment/Supplies: Were Home Health Services Ordered?: NA Any new equipment or medical supplies ordered?:  NA  Functional Questionnaire: Do you need assistance with bathing/showering or dressing?: No Do you need assistance with meal preparation?: No Do you need assistance with eating?: No Do you have difficulty maintaining continence: No Do you need assistance with getting out of bed/getting out of a chair/moving?: No Do you have difficulty managing or taking your medications?: No  Follow up appointments reviewed: PCP Follow-up appointment confirmed?: Yes Date of PCP follow-up appointment?: 02/03/23 [facility] Follow-up appointment confirmed?: NA Do you need transportation to your follow-up appointment?: No Do you understand care options if your condition(s) worsen?: Yes-patient verbalized understanding    SIGNATURE Julian Lemmings, LPN [facility] Nurse Health Advisor Direct Dial [phone]

## 2023-01-27 ENCOUNTER — Ambulatory Visit (INDEPENDENT_AMBULATORY_CARE_PROVIDER_SITE_OTHER): Payer: Medicare PPO

## 2023-01-27 DIAGNOSIS — I480 Paroxysmal atrial fibrillation: Secondary | ICD-10-CM

## 2023-01-28 ENCOUNTER — Telehealth: Payer: Self-pay | Admitting: Internal Medicine

## 2023-01-28 ENCOUNTER — Other Ambulatory Visit: Payer: Self-pay

## 2023-01-28 ENCOUNTER — Other Ambulatory Visit: Payer: Self-pay | Admitting: Internal Medicine

## 2023-01-28 LAB — CUP PACEART REMOTE DEVICE CHECK
Date Time Interrogation Session: 20250105232001
Implantable Pulse Generator Implant Date: 20240329

## 2023-01-28 MED ORDER — AMLODIPINE BESYLATE 2.5 MG PO TABS: ORAL_TABLET | ORAL | 3 refills | Status: DC

## 2023-01-28 NOTE — Telephone Encounter (Signed)
 Pt c/o medication issue:  1. Name of Medication: amLODipine  (NORVASC ) 2.5 MG tablet   2. How are you currently taking this medication (dosage and times per day)?    3. Are you having a reaction (difficulty breathing--STAT)? no  4. What is your medication issue? Husband calling to speak to the nurse about medication. He states that it is urgent. Please advise

## 2023-01-28 NOTE — Telephone Encounter (Signed)
 New RX with new quantity sent in for the pt.

## 2023-02-02 ENCOUNTER — Encounter: Payer: Self-pay | Admitting: Internal Medicine

## 2023-02-02 DIAGNOSIS — S2241XA Multiple fractures of ribs, right side, initial encounter for closed fracture: Secondary | ICD-10-CM | POA: Insufficient documentation

## 2023-02-02 NOTE — Patient Instructions (Addendum)
        Medications changes include :   start mirtazapine  7.5 mg at night -- STOP melatonin, tylenol  pm  Take tylenol  1000 mg three times a day   Let me know about physical therapy at home when you are ready in a couple of weeks.   Return for follow up as scheduled.

## 2023-02-02 NOTE — Progress Notes (Signed)
 Subjective:    Patient ID: Amy [name], female    DOB: 1936-01-16, 88 y.o.   MRN: [id number]     HPI Amy [name] is here for follow up from the hospital  ED 01/23/23 - went for fall at home and right rib pain.  The day prior she fell in the bathroom and landed on her right ribs.  She denied SOB, chest pain outside of the rib pain.  No other injuries.  No LOC or head trauma.  Xray showed mildly displaced fracture of 4-5th ribs.  She was given a lidocaine  patch.  Discharged home with oxycodone .    Taking tramadol  for pain that I prescribed in the past.  Also taking tylenol .  Has taken oxycodone  on occasion for pain and that helps more than the tramadol .  She still struggles with her appetite.  Sometimes she will be eating something and initially taste good, but then you no longer taste good.  She sometimes has to force herself to eat.  Using walker all the time.  Walking in house.      Medications and allergies reviewed with patient and updated if appropriate.  Current Outpatient Medications on File Prior to Visit  Medication Sig Dispense Refill   acetaminophen  (TYLENOL ) 500 MG tablet Take 2 tablets (1,000 mg total) by mouth every 6 (six) hours. 30 tablet 0   ALPRAZolam  (XANAX ) 0.25 MG tablet Take 1 tablet (0.25 mg total) by mouth 2 (two) times daily as needed for anxiety. 20 tablet 0   amiodarone  (PACERONE ) 200 MG tablet Take 1 tablet (200 mg total) by mouth daily. 90 tablet 3   amLODipine  (NORVASC ) 2.5 MG tablet Take one tablet (2.5 mg) by mouth daily and may take an extra tablet (2.5 mg) for BP staying greater than 160. 120 tablet 3   atenolol  (TENORMIN ) 25 MG tablet TAKE 1 TABLET (25 MG TOTAL) BY MOUTH DAILY. 90 tablet 3   brimonidine (ALPHAGAN) 0.2 % ophthalmic solution SMARTSIG:In Eye(s)     Calcium  Carbonate-Vitamin D  (CALCIUM  + D PO) Take 1 tablet by mouth daily.     latanoprost  (XALATAN ) 0.005 % ophthalmic solution Place 1 drop into both eyes at bedtime. 2.5 mL 0    levothyroxine  (SYNTHROID ) 50 MCG tablet TAKE 1 TABLET BY MOUTH EVERY DAY 90 tablet 3   lidocaine  (LIDODERM ) 5 % Place 1 patch onto the skin daily. Remove & Discard patch within 12 hours or as directed by MD 30 patch 0   linaclotide  (LINZESS ) 72 MCG capsule Take 1 capsule (72 mcg total) by mouth as needed (for constipation). 30 capsule 0   melatonin 5 MG TABS Take 5 mg by mouth at bedtime.     methocarbamol  (ROBAXIN ) 500 MG tablet Take 1 tablet (500 mg total) by mouth 3 (three) times daily. (Patient taking differently: Take 500 mg by mouth as needed for muscle spasms.) 90 tablet 0   polyethylene glycol (MIRALAX  / GLYCOLAX ) 17 g packet Take 17 g by mouth 2 (two) times daily. 14 each 0   Rivaroxaban  (XARELTO ) 15 MG TABS tablet Take 1 tablet (15 mg total) by mouth daily with supper. (Patient taking differently: Take 15 mg by mouth daily with supper. Pt takes every other day) 42 tablet    sertraline  (ZOLOFT ) 25 MG tablet Take 1 tablet (25 mg total) by mouth daily.     No current facility-administered medications on file prior to visit.     Review of Systems  Constitutional:  Negative for fever.  Respiratory:  Negative for cough, shortness of breath and wheezing.   Cardiovascular:  Negative for chest pain, palpitations and leg swelling.  Gastrointestinal:  Positive for constipation.  Neurological:  Positive for light-headedness (her husband - mild). Negative for headaches.       Objective:   Vitals:   02/03/23 0902  BP: 132/84  Pulse: 69  Temp: 98 F (36.7 C)  SpO2: 100%   BP Readings from Last 3 Encounters:  02/03/23 132/84  01/23/23 (!) 162/130  01/17/23 126/74   Wt Readings from Last 3 Encounters:  02/03/23 105 lb (47.6 kg)  01/17/23 108 lb (49 kg)  12/31/22 111 lb 6.4 oz (50.5 kg)   Body mass index is 16.45 kg/m.    Physical Exam Constitutional:      General: She is not in acute distress.    Appearance: Normal appearance.  HENT:     Head: Normocephalic and atraumatic.   Eyes:     Conjunctiva/sclera: Conjunctivae normal.  Cardiovascular:     Rate and Rhythm: Normal rate and regular rhythm.     Heart sounds: Normal heart sounds.  Pulmonary:     Effort: Pulmonary effort is normal. No respiratory distress.     Breath sounds: Normal breath sounds. No wheezing.  Chest:     Chest wall: Tenderness (Right ribs where fractures are and with breathing and deep/moving) present.  Musculoskeletal:     Cervical back: Neck supple.     Right lower leg: No edema.     Left lower leg: No edema.  Lymphadenopathy:     Cervical: No cervical adenopathy.  Skin:    General: Skin is warm and dry.     Findings: No rash.  Neurological:     Mental Status: She is alert. Mental status is at baseline.  Psychiatric:        Mood and Affect: Mood normal.        Behavior: Behavior normal.        Lab Results  Component Value Date   WBC 11.2 (H) 01/07/2023   HGB 11.7 01/07/2023   HCT 36.8 01/07/2023   PLT 226 01/07/2023   GLUCOSE 118 (H) 12/31/2022   CHOL 196 05/01/2022   TRIG 96.0 05/01/2022   HDL 93.50 05/01/2022   LDLDIRECT 84.9 11/15/2008   LDLCALC 83 05/01/2022   ALT 32 12/31/2022   AST 21 12/31/2022   NA 138 12/31/2022   K 4.6 12/31/2022   CL 101 12/31/2022   CREATININE 1.56 (H) 12/31/2022   BUN 28 (H) 12/31/2022   CO2 21 12/31/2022   TSH 3.570 12/31/2022   INR 1.0 07/18/2022   HGBA1C 4.8 05/01/2022     Assessment & Plan:    See Problem List for Assessment and Plan of chronic medical problems.

## 2023-02-03 ENCOUNTER — Encounter: Payer: Self-pay | Admitting: Internal Medicine

## 2023-02-03 ENCOUNTER — Ambulatory Visit: Payer: Medicare PPO | Admitting: Internal Medicine

## 2023-02-03 VITALS — BP 132/84 | HR 69 | Temp 98.0°F | Ht 67.0 in | Wt 105.0 lb

## 2023-02-03 DIAGNOSIS — S2241XA Multiple fractures of ribs, right side, initial encounter for closed fracture: Secondary | ICD-10-CM

## 2023-02-03 DIAGNOSIS — W1830XA Fall on same level, unspecified, initial encounter: Secondary | ICD-10-CM

## 2023-02-03 DIAGNOSIS — F32A Depression, unspecified: Secondary | ICD-10-CM | POA: Diagnosis not present

## 2023-02-03 DIAGNOSIS — F419 Anxiety disorder, unspecified: Secondary | ICD-10-CM

## 2023-02-03 DIAGNOSIS — F5101 Primary insomnia: Secondary | ICD-10-CM

## 2023-02-03 DIAGNOSIS — I10 Essential (primary) hypertension: Secondary | ICD-10-CM

## 2023-02-03 DIAGNOSIS — R5381 Other malaise: Secondary | ICD-10-CM

## 2023-02-03 MED ORDER — MIRTAZAPINE 7.5 MG PO TABS: 7.5000 mg | ORAL_TABLET | Freq: Every day | ORAL | 3 refills | Status: DC

## 2023-02-03 NOTE — Assessment & Plan Note (Addendum)
 Acute Related to fall 01/22/2023 at home Evaluated in the ED-mildly displaced fractures right 4-fifth ribs Pain management with Tylenol -advised taking Tylenol  3 times a day up to 3000 mg daily Tramadol  2-3 times a day for now since pain is more severe-can take with Tylenol  Ideally avoid oxycodone , but if she has severe pain and still has some at home she can take as needed Encouraged her to get up and move around throughout the day

## 2023-02-03 NOTE — Assessment & Plan Note (Signed)
 Chronic Currently taking melatonin nightly and sometimes more than once a night Sometimes takes Tylenol  PM, which I have advised her not to take anymore Stop melatonin Retry mirtazapine  7.5 mg nightly which may help with some low-level depression, sleep and hopefully improve her appetite-if this makes her too drowsy we will go back to melatonin

## 2023-02-03 NOTE — Assessment & Plan Note (Signed)
 Chronic With frequent falls Currently using the walker-rollator in the house all the time-stressed that she needs to use this continuously In good amount of pain from her rib fractures, but in a few weeks when pain is better discussed home PT-she will let me know so that I can get this ordered

## 2023-02-03 NOTE — Assessment & Plan Note (Signed)
 Chronic Blood pressure today adequately controlled Managed by cardiology Continue amlodipine 2.5 mg daily, atenolol 25 mg daily She does monitor it at home

## 2023-02-03 NOTE — Assessment & Plan Note (Addendum)
 Denies depression, anxiety  Not on any medication for depression Initially she denies depression, but states that not being able to do some of the things she wants to do does cause some depression Retry mirtazapine  7.5 mg at bedtime-hopefully this will also help stimulate appetite Advised you not to take any melatonin or other sleep aids Continue alprazolam  0.25 mg twice daily as needed

## 2023-02-03 NOTE — Assessment & Plan Note (Signed)
 Recent fall at home resulting in 2 rib fractures on the right History of falls Physical deconditioning, poor balance Discussed fall prevention, physical therapy, balance exercises

## 2023-02-20 ENCOUNTER — Other Ambulatory Visit: Payer: Self-pay | Admitting: Internal Medicine

## 2023-02-22 ENCOUNTER — Encounter (HOSPITAL_COMMUNITY): Payer: Self-pay | Admitting: *Deleted

## 2023-02-22 ENCOUNTER — Emergency Department (HOSPITAL_COMMUNITY): Payer: Medicare PPO

## 2023-02-22 ENCOUNTER — Other Ambulatory Visit: Payer: Self-pay

## 2023-02-22 ENCOUNTER — Emergency Department (HOSPITAL_COMMUNITY)
Admission: EM | Admit: 2023-02-22 | Discharge: 2023-02-22 | Disposition: A | Discharge status: Home / Self Care | Payer: Medicare PPO | Attending: Emergency Medicine | Admitting: Emergency Medicine

## 2023-02-22 DIAGNOSIS — Z79899 Other long term (current) drug therapy: Secondary | ICD-10-CM | POA: Insufficient documentation

## 2023-02-22 DIAGNOSIS — I129 Hypertensive chronic kidney disease with stage 1 through stage 4 chronic kidney disease, or unspecified chronic kidney disease: Secondary | ICD-10-CM | POA: Insufficient documentation

## 2023-02-22 DIAGNOSIS — S0990XA Unspecified injury of head, initial encounter: Secondary | ICD-10-CM | POA: Diagnosis not present

## 2023-02-22 DIAGNOSIS — S3210XD Unspecified fracture of sacrum, subsequent encounter for fracture with routine healing: Secondary | ICD-10-CM | POA: Diagnosis not present

## 2023-02-22 DIAGNOSIS — M47814 Spondylosis without myelopathy or radiculopathy, thoracic region: Secondary | ICD-10-CM | POA: Diagnosis not present

## 2023-02-22 DIAGNOSIS — M549 Dorsalgia, unspecified: Secondary | ICD-10-CM | POA: Diagnosis not present

## 2023-02-22 DIAGNOSIS — G9389 Other specified disorders of brain: Secondary | ICD-10-CM | POA: Diagnosis not present

## 2023-02-22 DIAGNOSIS — Z043 Encounter for examination and observation following other accident: Secondary | ICD-10-CM | POA: Diagnosis not present

## 2023-02-22 DIAGNOSIS — N189 Chronic kidney disease, unspecified: Secondary | ICD-10-CM | POA: Insufficient documentation

## 2023-02-22 DIAGNOSIS — S22050A Wedge compression fracture of T5-T6 vertebra, initial encounter for closed fracture: Secondary | ICD-10-CM | POA: Diagnosis not present

## 2023-02-22 DIAGNOSIS — W01198A Fall on same level from slipping, tripping and stumbling with subsequent striking against other object, initial encounter: Secondary | ICD-10-CM | POA: Insufficient documentation

## 2023-02-22 DIAGNOSIS — Z7901 Long term (current) use of anticoagulants: Secondary | ICD-10-CM | POA: Diagnosis not present

## 2023-02-22 DIAGNOSIS — M48061 Spinal stenosis, lumbar region without neurogenic claudication: Secondary | ICD-10-CM | POA: Diagnosis not present

## 2023-02-22 DIAGNOSIS — M419 Scoliosis, unspecified: Secondary | ICD-10-CM | POA: Diagnosis not present

## 2023-02-22 DIAGNOSIS — S32591A Other specified fracture of right pubis, initial encounter for closed fracture: Secondary | ICD-10-CM | POA: Diagnosis not present

## 2023-02-22 DIAGNOSIS — M47816 Spondylosis without myelopathy or radiculopathy, lumbar region: Secondary | ICD-10-CM | POA: Diagnosis not present

## 2023-02-22 DIAGNOSIS — S32511A Fracture of superior rim of right pubis, initial encounter for closed fracture: Secondary | ICD-10-CM | POA: Diagnosis not present

## 2023-02-22 DIAGNOSIS — I6782 Cerebral ischemia: Secondary | ICD-10-CM | POA: Diagnosis not present

## 2023-02-22 DIAGNOSIS — M546 Pain in thoracic spine: Secondary | ICD-10-CM | POA: Diagnosis present

## 2023-02-22 DIAGNOSIS — M4186 Other forms of scoliosis, lumbar region: Secondary | ICD-10-CM | POA: Diagnosis not present

## 2023-02-22 MED ORDER — LIDOCAINE 5 % EX PTCH: 1.0000 | MEDICATED_PATCH | CUTANEOUS | 0 refills | Status: DC

## 2023-02-22 MED ORDER — LIDOCAINE 5 % EX PTCH
1.0000 | MEDICATED_PATCH | CUTANEOUS | Status: DC
  Filled 2023-02-22: qty 1

## 2023-02-22 MED ORDER — ACETAMINOPHEN 325 MG PO TABS
650.0000 mg | ORAL_TABLET | Freq: Once | ORAL | Status: AC
  Administered 2023-02-22: 650 mg via ORAL
  Filled 2023-02-22: qty 2

## 2023-02-22 NOTE — ED Triage Notes (Signed)
Pt using a rolling walker while putting some water in the refrigerator, pt states she landed on her bottom.  Denies hitting her head. Pt c/o pain to back.

## 2023-02-22 NOTE — Discharge Instructions (Addendum)
It was a pleasure taking care of you today.  You were evaluated in the ER for back pain after a fall.  Your results showed likely mild compression fracture of your T6 vertebrae.  You can take over-the-counter Tylenol as directed on packaging for discomfort and you were also prescribed topical lidocaine patches to help with your pain.  Your blood pressure was elevated in the ER.  Make sure you take your medications at home and have your primary care doctor recheck this closely.  It is important to follow-up closely with your primary care doctor and/or any specialist as discussed.  Come back to the ER if you have any new or worsening symptoms.

## 2023-02-22 NOTE — ED Provider Notes (Cosign Needed Addendum)
[place] EMERGENCY DEPARTMENT AT [facility] Provider Note   CSN: [id number] Arrival date & time: 02/22/23  1320     History  Chief Complaint  Patient presents with   Amy [name] is a 88 y.o. female.  [name] the ER today complaining of mid upper back pain after a fall.  She states she was using her rollator to get to the refrigerator.  When she turned around and head slightly moved and she did not grab onto it and fell to the ground.  States she hit her back on her dishwasher and "lightly" touch her head but denies significant head strike or LOC.  She is on Xarelto.  Denies headache nausea or vomiting, no chest pain or shortness of breath.  Did not get dizzy or pass out leading up to the fall having no other complaints at this time but states her back is sore to the touch.  No numbness tingling or weakness   Fall       Home Medications Prior to Admission medications   Medication Sig Start Date End Date Taking? Authorizing Provider  acetaminophen (TYLENOL) 500 MG tablet Take 2 tablets (1,000 mg total) by mouth every 6 (six) hours. 07/22/22   Joseph Art, DO  ALPRAZolam (XANAX) 0.25 MG tablet Take 1 tablet (0.25 mg total) by mouth 2 (two) times daily as needed for anxiety. 08/09/22   Crain, Whitney L, PA  amiodarone (PACERONE) 200 MG tablet Take 1 tablet (200 mg total) by mouth daily. 01/02/23   Pricilla Riffle, MD  amLODipine (NORVASC) 2.5 MG tablet Take one tablet (2.5 mg) by mouth daily and may take an extra tablet (2.5 mg) for BP staying greater than 160. 01/28/23   Pricilla Riffle, MD  atenolol (TENORMIN) 25 MG tablet TAKE 1 TABLET (25 MG TOTAL) BY MOUTH DAILY. 01/24/23   Pricilla Riffle, MD  brimonidine [facility]) 0.2 % ophthalmic solution SMARTSIG:In Eye(s) 01/30/23   [provider]  Calcium Carbonate-Vitamin D (CALCIUM + D PO) Take 1 tablet by mouth daily.    [provider]  latanoprost (XALATAN) 0.005 % ophthalmic solution Place 1 drop into both  eyes at bedtime. 07/05/22   Sharee Holster, NP  levothyroxine (SYNTHROID) 50 MCG tablet TAKE 1 TABLET BY MOUTH EVERY DAY 11/06/22   Pricilla Riffle, MD  lidocaine (LIDODERM) 5 % Place 1 patch onto the skin daily. Remove & Discard patch within 12 hours or as directed by MD 02/22/23   Ma Rings, PA-C  linaclotide (LINZESS) 72 MCG capsule Take 1 capsule (72 mcg total) by mouth as needed (for constipation). 07/05/22   Sharee Holster, NP  melatonin 5 MG TABS Take 5 mg by mouth at bedtime.    [provider]  methocarbamol (ROBAXIN) 500 MG tablet Take 1 tablet (500 mg total) by mouth 3 (three) times daily. Patient taking differently: Take 500 mg by mouth as needed for muscle spasms. 07/05/22   Sharee Holster, NP  mirtazapine (REMERON) 7.5 MG tablet Take 1 tablet (7.5 mg total) by mouth at bedtime. 02/03/23   Pincus Sanes, MD  polyethylene glycol (MIRALAX / GLYCOLAX) 17 g packet Take 17 g by mouth 2 (two) times daily. 07/22/22   Joseph Art, DO  Rivaroxaban (XARELTO) 15 MG TABS tablet Take 1 tablet (15 mg total) by mouth daily with supper. Patient taking differently: Take 15 mg by mouth daily with supper. Pt takes every other day  07/22/22   Joseph Art, DO  traMADol (ULTRAM) 50 MG tablet TAKE 1 TABLET BY MOUTH EVERY 8 HOURS AS NEEDED 02/20/23   Pincus Sanes, MD      Allergies    Patient has no known allergies.    Review of Systems   Review of Systems  Physical Exam Updated Vital Signs BP (!) 186/96   Pulse 61   Temp 98.9 F (37.2 C) (Oral)   Resp 18   Ht 5\' 7"  (1.702 m)   Wt 45.4 kg   LMP  (LMP Unknown)   SpO2 98%   BMI 15.66 kg/m  Physical Exam Vitals and nursing note reviewed.  Constitutional:      General: She is not in acute distress.    Appearance: She is well-developed.  HENT:     Head: Normocephalic and atraumatic.  Eyes:     Conjunctiva/sclera: Conjunctivae normal.  Cardiovascular:     Rate and Rhythm: Normal rate and regular rhythm.     Heart  sounds: No murmur heard. Pulmonary:     Effort: Pulmonary effort is normal. No respiratory distress.     Breath sounds: Normal breath sounds.  Abdominal:     Palpations: Abdomen is soft.     Tenderness: There is no abdominal tenderness.  Musculoskeletal:        General: No swelling.     Cervical back: Neck supple.  Skin:    General: Skin is warm and dry.     Capillary Refill: Capillary refill takes less than 2 seconds.  Neurological:     Mental Status: She is alert and oriented to person, place, and time.  Psychiatric:        Mood and Affect: Mood normal.     ED Results / Procedures / Treatments   Labs (all labs ordered are listed, but only abnormal results are displayed) Labs Reviewed - No data to display  EKG None  Radiology DG Thoracic Spine 2 View Result Date: 02/22/2023 CLINICAL DATA:  Fall with upper back pain EXAM: THORACIC SPINE 2 VIEWS COMPARISON:  None Available. FINDINGS: Electronic device over left chest. Zipper artifact over the upper anterior chest. Mild scoliosis. Age indeterminate mild compression deformity of mid to upper thoracic vertebra approximate T6 level. Moderate lower thoracic degenerative changes IMPRESSION: Age indeterminate mild compression deformity of mid to upper thoracic vertebra approximate T6 level. Correlate for point tenderness. Electronically Signed   By: Jasmine Pang M.D.   On: 02/22/2023 17:40   CT Head Wo Contrast Result Date: 02/22/2023 CLINICAL DATA:  Minor head trauma due to a fall. EXAM: CT HEAD WITHOUT CONTRAST TECHNIQUE: Contiguous axial images were obtained from the base of the skull through the vertex without intravenous contrast. RADIATION DOSE REDUCTION: This exam was performed according to the departmental dose-optimization program which includes automated exposure control, adjustment of the mA and/or kV according to patient size and/or use of iterative reconstruction technique. COMPARISON:  06/16/2022 FINDINGS: Brain: Diffuse  cerebral atrophy. Ventricular dilatation consistent with central atrophy. Low-attenuation changes in the deep white matter consistent with small vessel ischemia. No abnormal extra-axial fluid collections. No mass effect or midline shift. Gray-white matter junctions are distinct. Basal cisterns are not effaced. No acute intracranial hemorrhage. Vascular: No hyperdense vessel or unexpected calcification. Skull: Normal. Negative for fracture or focal lesion. Sinuses/Orbits: No acute finding. Other: None. IMPRESSION: No acute intracranial abnormalities. Chronic atrophy and small vessel ischemic changes. Electronically Signed   By: Burman Nieves M.D.   On: 02/22/2023 17:27  DG Pelvis 1-2 Views Result Date: 02/22/2023 CLINICAL DATA:  Fall EXAM: PELVIS - 1-2 VIEW COMPARISON:  07/19/2022 FINDINGS: Single fixating screw across the sacrum and SI joints with stable alignment. Chronic right segment superior pubic ramus fracture with additional inferior pubic ramus fracture with some callus formation. Some resorption and sclerosis along the fracture margins since prior exam. Sclerosis at the right greater than left sacral ala corresponding to history of fractures. No hip dislocation. IMPRESSION: 1. No acute osseous abnormality. 2. Chronic displaced right superior and inferior pubic rami fractures with some callus formation but increased resorption and sclerosis at fracture margins. 3. Chronic right greater than left sacral ala fractures with fixating screw across the sacrum and SI joints, stable alignment. Electronically Signed   By: Jasmine Pang M.D.   On: 02/22/2023 15:56   DG Lumbar Spine Complete Result Date: 02/22/2023 CLINICAL DATA:  Fall EXAM: LUMBAR SPINE - COMPLETE 4+ VIEW COMPARISON:  07/19/2022 pelvic radiograph FINDINGS: Marked scol S shaped scoliosis of the lumbar spine. Vertebral body heights are grossly maintained. Advanced disc space narrowing and degenerative change L1 through L5. Prominent lower lumbar  facet degenerative changes. Slightly limited assessment for fracture due to scoliosis and degenerative change IMPRESSION: Marked scoliosis and degenerative change. No definite acute osseous abnormality. Electronically Signed   By: Jasmine Pang M.D.   On: 02/22/2023 15:51    Procedures Procedures    Medications Ordered in ED Medications  lidocaine (LIDODERM) 5 % 1 patch (0 patches Transdermal Not Given 02/22/23 1658)  acetaminophen (TYLENOL) tablet 650 mg (650 mg Oral Given 02/22/23 1657)    ED Course/ Medical Decision Making/ A&P                                 Medical Decision Making This patient presents to the ED for concern of mid back pain after fall, this involves an extensive number of treatment options, and is a complaint that carries with it a high risk of complications and morbidity.  The differential diagnosis includes, fracture, contusion, muscle strain, other   Co morbidities that complicate the patient evaluation  Hypertension, CKD, A-fib on anticoagulation   Additional history obtained:  Additional history obtained from EMR External records from outside source obtained and reviewed including prior notes, med list, labs     Imaging Studies ordered:  I ordered imaging studies including CT head I independently visualized and interpreted imaging which showed no intracranial hemorrhage Additionally x-ray pelvis showed chronic fractures but no acute findings, lumbar x-rays showed chronic changes but no acute findings, thoracic spine x-ray shows age-indeterminate compression fracture approximately T6 level  I agree with the radiologist interpretation     Problem List / ED Course / Critical interventions / Medication management  Fall with back pain-patient had mechanical fall, unfortunately is on blood thinners so CT head ordered due to fall with possible mild head trauma and is normal, she had no neurologic findings, no neck pain, her only pain is in her thoracic  spine around level of T5-T6 which is corresponding to a mild compression deformity on x-ray.  Patient's pain was controlled with lidocaine patch and Tylenol.  She was able to ambulate with her rollator in the ED to the bathroom without difficulty she has no neurologic deficits including no numbness tingling or weakness.  Advised on neurosurgery follow-up and return precautions.  I have reviewed the patients home medicines and have made adjustments as needed  Amount and/or Complexity of Data Reviewed Radiology: ordered.  Risk OTC drugs. Prescription drug management.           Final Clinical Impression(s) / ED Diagnoses Final diagnoses:  Compression fracture of T6 vertebra, initial encounter [facility])    Rx / DC Orders ED Discharge Orders          Ordered    lidocaine (LIDODERM) 5 %  Every 24 hours        02/22/23 1806              Ma Rings, PA-C 02/22/23 1812    Ma Rings, PA-C 02/22/23 1813    Eber Hong, MD 02/23/23 [phone]

## 2023-02-25 ENCOUNTER — Telehealth: Payer: Self-pay

## 2023-02-25 NOTE — Transitions of Care (Post Inpatient/ED Visit) (Signed)
 02/25/2023  Name: Amy [name] MRN: [id number] DOB: 08-17-1935  Today's TOC FU Call Status: Today's TOC FU Call Status:: Successful TOC FU Call Completed TOC FU Call Complete Date: 02/25/23 Patient's Name and Date of Birth confirmed.  Transition Care Management Follow-up Telephone Call Date of Discharge: 02/22/23 [facility] Type of Discharge: Emergency Department Reason for ED Visit: Other: (Compression fracture of T6 vertebra) How have you been since you were released from the hospital?: Same Any questions or concerns?: Yes Patient Questions/Concerns:: patient fell again on Sunday once she was home, states she was trying to put something in the refrigerator and got tangled up and fell. She is sore but says she is ok. Patient Questions/Concerns Addressed: Notified Provider of Patient Questions/Concerns  Items Reviewed: Did you receive and understand the discharge instructions provided?: Yes Medications obtained,verified, and reconciled?: Yes (Medications Reviewed) Any new allergies since your discharge?: No Dietary orders reviewed?: NA Do you have support at home?: Yes People in Home: spouse  Medications Reviewed Today: Medications Reviewed Today     Reviewed by Lang Avelina PARAS, CMA (Certified Medical Assistant) on 02/25/23 at 1613  Med List Status: <None>   Medication Order Taking? Sig Documenting Provider Last Dose Status Informant  acetaminophen  (TYLENOL ) 500 MG tablet [id number] No Take 2 tablets (1,000 mg total) by mouth every 6 (six) hours. Vann, Jessica U, DO Taking Active   ALPRAZolam  (XANAX ) 0.25 MG tablet [id number] No Take 1 tablet (0.25 mg total) by mouth 2 (two) times daily as needed for anxiety. Crain, Whitney L, PA Taking Active   amiodarone  (PACERONE ) 200 MG tablet [id number] No Take 1 tablet (200 mg total) by mouth daily. Okey Vina GAILS, MD Taking Active   amLODipine  (NORVASC ) 2.5 MG tablet [id number] No Take one tablet (2.5 mg) by mouth  daily and may take an extra tablet (2.5 mg) for BP staying greater than 160. Okey Vina GAILS, MD Taking Active   atenolol  (TENORMIN ) 25 MG tablet [id number] No TAKE 1 TABLET (25 MG TOTAL) BY MOUTH DAILY. Okey Vina GAILS, MD Taking Active   brimonidine (ALPHAGAN) 0.2 % ophthalmic solution [id number] No SMARTSIG:In Eye(s) [provider] Taking Active   Calcium  Carbonate-Vitamin D  (CALCIUM  + D PO) [id number] No Take 1 tablet by mouth daily. [provider] Taking Active Family Member, Pharmacy Records  latanoprost  (XALATAN ) 0.005 % ophthalmic solution [id number] No Place 1 drop into both eyes at bedtime. Landy Barnie RAMAN, NP Taking Active Family Member, Pharmacy Records  levothyroxine  (SYNTHROID ) 50 MCG tablet [id number] No TAKE 1 TABLET BY MOUTH EVERY DAY Okey Vina GAILS, MD Taking Active   lidocaine  (LIDODERM ) 5 % [id number]  Place 1 patch onto the skin daily. Remove & Discard patch within 12 hours or as directed by MD Suellen Cantor A, PA-C  Active   linaclotide  (LINZESS ) 72 MCG capsule [id number] No Take 1 capsule (72 mcg total) by mouth as needed (for constipation). Landy Barnie RAMAN, NP Taking Active Family Member, Pharmacy Records  melatonin 5 MG TABS [id number] No Take 5 mg by mouth at bedtime. [provider] Taking Active Family Member, Pharmacy Records  methocarbamol  (ROBAXIN ) 500 MG tablet [id number] No Take 1 tablet (500 mg total) by mouth 3 (three) times daily.  Patient taking differently: Take 500 mg by mouth as needed for muscle spasms.   Landy Barnie RAMAN, NP Taking Active Family Member, Pharmacy Records  mirtazapine  (REMERON ) 7.5 MG tablet [id number]  Take 1 tablet (7.5 mg total) by mouth at bedtime. Burns,  Glade PARAS, MD  Active   polyethylene glycol (MIRALAX  / GLYCOLAX ) 17 g packet [id number] No Take 17 g by mouth 2 (two) times daily. Vann, Jessica U, DO Taking Active   Rivaroxaban  (XARELTO ) 15 MG TABS tablet [id number] No Take 1 tablet (15 mg total) by mouth daily with supper.   Patient taking differently: Take 15 mg by mouth daily with supper. Pt takes every other day   Vann, Jessica U, DO Taking Active   traMADol  (ULTRAM ) 50 MG tablet [id number]  TAKE 1 TABLET BY MOUTH EVERY 8 HOURS AS NEEDED Geofm Glade PARAS, MD  Active             Home Care and Equipment/Supplies: Were Home Health Services Ordered?: NA Any new equipment or medical supplies ordered?: NA  Functional Questionnaire: Do you need assistance with bathing/showering or dressing?: No Do you need assistance with meal preparation?: No Do you need assistance with eating?: No Do you have difficulty maintaining continence: No Do you need assistance with getting out of bed/getting out of a chair/moving?: No Do you have difficulty managing or taking your medications?: No  Follow up appointments reviewed: PCP Follow-up appointment confirmed?: Yes Date of PCP follow-up appointment?: 02/27/23 Follow-up Provider: Glade Geofm, MD Specialist Hospital Follow-up appointment confirmed?: NA Do you need transportation to your follow-up appointment?: No Do you understand care options if your condition(s) worsen?: Yes-patient verbalized understanding    SIGNATURE Avelina Essex, CMA (AAMA)  [facility] [phone]

## 2023-02-26 ENCOUNTER — Encounter: Payer: Self-pay | Admitting: Internal Medicine

## 2023-02-26 ENCOUNTER — Other Ambulatory Visit: Payer: Self-pay | Admitting: Internal Medicine

## 2023-02-26 DIAGNOSIS — S22050A Wedge compression fracture of T5-T6 vertebra, initial encounter for closed fracture: Secondary | ICD-10-CM | POA: Insufficient documentation

## 2023-02-26 NOTE — Progress Notes (Signed)
 Subjective:    Patient ID: Amy [name], female    DOB: 1935/12/07, 88 y.o.   MRN: [id number]     HPI Amy [name] is here for follow up from the hospital   ED 01/23/23 - fell on right side at home in bathroom.  ED - mildly displaced fracture of anterolateral right 4-5th ribs.  Rx'd lidocaine  patch, oxycodone .  ED 02/22/23 - fall at home - turned in kitchen with rollator and fell.  Hit back of head lightly and back.  No LOC. Xrays - T6 compression fracture.     The rib pain is ok.  The back pain is bad.  She made a quick movement the other day and the back pain increased.    Medications and allergies reviewed with patient and updated if appropriate.  Current Outpatient Medications on File Prior to Visit  Medication Sig Dispense Refill   acetaminophen  (TYLENOL ) 500 MG tablet Take 2 tablets (1,000 mg total) by mouth every 6 (six) hours. 30 tablet 0   ALPRAZolam  (XANAX ) 0.25 MG tablet Take 1 tablet (0.25 mg total) by mouth 2 (two) times daily as needed for anxiety. 20 tablet 0   amiodarone  (PACERONE ) 200 MG tablet Take 1 tablet (200 mg total) by mouth daily. 90 tablet 3   amLODipine  (NORVASC ) 2.5 MG tablet Take one tablet (2.5 mg) by mouth daily and may take an extra tablet (2.5 mg) for BP staying greater than 160. 120 tablet 3   atenolol  (TENORMIN ) 25 MG tablet TAKE 1 TABLET (25 MG TOTAL) BY MOUTH DAILY. 90 tablet 3   brimonidine (ALPHAGAN) 0.2 % ophthalmic solution SMARTSIG:In Eye(s)     Calcium  Carbonate-Vitamin D  (CALCIUM  + D PO) Take 1 tablet by mouth daily.     dorzolamide (TRUSOPT) 2 % ophthalmic solution 1 drop every morning.     latanoprost  (XALATAN ) 0.005 % ophthalmic solution Place 1 drop into both eyes at bedtime. 2.5 mL 0   levothyroxine  (SYNTHROID ) 50 MCG tablet TAKE 1 TABLET BY MOUTH EVERY DAY 90 tablet 3   lidocaine  (LIDODERM ) 5 % Place 1 patch onto the skin daily. Remove & Discard patch within 12 hours or as directed by MD 30 patch 0   linaclotide  (LINZESS ) 72 MCG  capsule Take 1 capsule (72 mcg total) by mouth as needed (for constipation). 30 capsule 0   melatonin 5 MG TABS Take 5 mg by mouth at bedtime.     methocarbamol  (ROBAXIN ) 500 MG tablet Take 1 tablet (500 mg total) by mouth 3 (three) times daily. (Patient taking differently: Take 500 mg by mouth as needed for muscle spasms.) 90 tablet 0   polyethylene glycol (MIRALAX  / GLYCOLAX ) 17 g packet Take 17 g by mouth 2 (two) times daily. 14 each 0   Rivaroxaban  (XARELTO ) 15 MG TABS tablet Take 1 tablet (15 mg total) by mouth daily with supper. (Patient taking differently: Take 15 mg by mouth daily with supper. Pt takes every other day) 42 tablet    traMADol  (ULTRAM ) 50 MG tablet TAKE 1 TABLET BY MOUTH EVERY 8 HOURS AS NEEDED 90 tablet 0   No current facility-administered medications on file prior to visit.     Review of Systems  Constitutional:  Negative for fever.  Respiratory:  Negative for cough, shortness of breath and wheezing.   Cardiovascular:  Negative for chest pain.  Gastrointestinal:  Negative for constipation.  Musculoskeletal:  Positive for back pain (wraps around to front).  Neurological:  Negative for light-headedness and  headaches.       Objective:   Vitals:   02/27/23 1109  BP: 136/74  Pulse: 62  Temp: 97.9 F (36.6 C)  SpO2: 97%   BP Readings from Last 3 Encounters:  02/27/23 136/74  02/22/23 (!) 181/98  02/03/23 132/84   Wt Readings from Last 3 Encounters:  02/22/23 100 lb (45.4 kg)  02/03/23 105 lb (47.6 kg)  01/17/23 108 lb (49 kg)   Body mass index is 15.66 kg/m.    Physical Exam Constitutional:      General: She is not in acute distress.    Appearance: Normal appearance.  HENT:     Head: Normocephalic and atraumatic.  Eyes:     Conjunctiva/sclera: Conjunctivae normal.  Cardiovascular:     Rate and Rhythm: Normal rate and regular rhythm.     Heart sounds: Normal heart sounds.  Pulmonary:     Effort: Pulmonary effort is normal. No respiratory  distress.     Breath sounds: Normal breath sounds. No wheezing.  Musculoskeletal:     Cervical back: Neck supple.     Right lower leg: No edema.     Left lower leg: No edema.  Lymphadenopathy:     Cervical: No cervical adenopathy.  Skin:    General: Skin is warm and dry.     Findings: No rash.  Neurological:     Mental Status: She is alert. Mental status is at baseline.  Psychiatric:        Mood and Affect: Mood normal.        Behavior: Behavior normal.        Lab Results  Component Value Date   WBC 11.2 (H) 01/07/2023   HGB 11.7 01/07/2023   HCT 36.8 01/07/2023   PLT 226 01/07/2023   GLUCOSE 118 (H) 12/31/2022   CHOL 196 05/01/2022   TRIG 96.0 05/01/2022   HDL 93.50 05/01/2022   LDLDIRECT 84.9 11/15/2008   LDLCALC 83 05/01/2022   ALT 32 12/31/2022   AST 21 12/31/2022   NA 138 12/31/2022   K 4.6 12/31/2022   CL 101 12/31/2022   CREATININE 1.56 (H) 12/31/2022   BUN 28 (H) 12/31/2022   CO2 21 12/31/2022   TSH 3.570 12/31/2022   INR 1.0 07/18/2022   HGBA1C 4.8 05/01/2022     Assessment & Plan:    See Problem List for Assessment and Plan of chronic medical problems.

## 2023-02-26 NOTE — Patient Instructions (Addendum)
       Medications changes include :   oxycodone  twice daily as needed.  Take with tylenol .     We will look into Evenity  for your osteoporosis.      Return for follow up as scheduled.

## 2023-02-27 ENCOUNTER — Ambulatory Visit: Payer: Medicare PPO | Admitting: Internal Medicine

## 2023-02-27 VITALS — BP 136/74 | HR 62 | Temp 97.9°F | Ht 67.0 in

## 2023-02-27 DIAGNOSIS — S2241XA Multiple fractures of ribs, right side, initial encounter for closed fracture: Secondary | ICD-10-CM

## 2023-02-27 DIAGNOSIS — M8000XA Age-related osteoporosis with current pathological fracture, unspecified site, initial encounter for fracture: Secondary | ICD-10-CM | POA: Diagnosis not present

## 2023-02-27 DIAGNOSIS — S22050A Wedge compression fracture of T5-T6 vertebra, initial encounter for closed fracture: Secondary | ICD-10-CM

## 2023-02-27 MED ORDER — OXYCODONE HCL 5 MG PO TABS: 5.0000 mg | ORAL_TABLET | Freq: Two times a day (BID) | ORAL | 0 refills | Status: DC | PRN

## 2023-02-27 NOTE — Assessment & Plan Note (Addendum)
 New Related to fall at home 02/22/2023 Pain management with Tylenol  3000 mg a day, tramadol  50 mg 3 times daily as needed Pain not ideally controlled - will give small amount of oxycodone  5 mg Q12 hr prn for severe pain -- advised to take tramadol  for mild- moderate pain or tyelnol Her and her husband know she can not take both tramadol  and oxycodone  at same time Discussed side effects Discussed treatment of her osteoporosis Discussed fall prevention

## 2023-02-27 NOTE — Assessment & Plan Note (Addendum)
 Chronic Multiple fractures secondary to frequent falls We have discussed medication in the past and I have stressed that she needs to be on something Discussed Evenity  and Prolia-Evenity  is ideal, but it would be difficult for her to get here once a month Start Evenity  injections monthly x 12 months then prolia Q 6 months

## 2023-02-27 NOTE — Assessment & Plan Note (Addendum)
 Acute Related to fall 01/22/2023 at home Evaluated in the ED-mildly displaced fractures right 4-5th ribs Pain management with Tylenol -advised taking Tylenol  3 times a day up to 3000 mg daily Tramadol  2-3 times a day for now since pain is more severe-can take with Tylenol  Pain is controlled Encouraged her to get up and move around throughout the day Discussed home PT

## 2023-02-27 NOTE — Transitions of Care (Post Inpatient/ED Visit) (Signed)
   02/27/2023  Name: Amy [name] MRN: [id number] DOB: 12/30/1935  Today's TOC FU Call Status: Today's TOC FU Call Status:: Unsuccessful Call (2nd Attempt) Unsuccessful Call (2nd Attempt) Date: 02/27/23 [employer] FU Call Complete Date: 02/25/23  Attempted to reach the patient regarding the most recent Inpatient/ED visit.  Follow Up Plan: No further outreach attempts will be made at this time. We have been unable to contact the patient. Patient already seen by PCP.  Signature Avelina Essex, CMA (AAMA)  [facility] [phone]

## 2023-02-28 ENCOUNTER — Telehealth: Payer: Self-pay

## 2023-02-28 ENCOUNTER — Telehealth: Payer: Self-pay | Admitting: *Deleted

## 2023-02-28 DIAGNOSIS — I10 Essential (primary) hypertension: Secondary | ICD-10-CM

## 2023-02-28 NOTE — Progress Notes (Signed)
 Complex Care Management Note Care Guide Note  02/28/2023 Name: Amy [name] MRN: [id number] DOB: 05-15-1935   Complex Care Management Outreach Attempts: An unsuccessful telephone outreach was attempted today to offer the patient information about available complex care management services.  Follow Up Plan:  Additional outreach attempts will be made to offer the patient complex care management information and services.   Encounter Outcome:  No Answer  Thedford Franks, CMA, Care Guide [facility]  [facility], [facility] Guide Direct Dial: [phone]  Fax: [phone] Website: [email or web address]

## 2023-03-03 ENCOUNTER — Ambulatory Visit (INDEPENDENT_AMBULATORY_CARE_PROVIDER_SITE_OTHER): Payer: Medicare PPO

## 2023-03-03 DIAGNOSIS — R55 Syncope and collapse: Secondary | ICD-10-CM

## 2023-03-03 DIAGNOSIS — I480 Paroxysmal atrial fibrillation: Secondary | ICD-10-CM

## 2023-03-03 NOTE — Progress Notes (Signed)
 Complex Care Management Note  Care Guide Note 03/03/2023 Name: Amy [name] MRN: [id number] DOB: 10/18/1935  [name] is a 88 y.o. year old female who sees Burns, Beckey Bourgeois, MD for primary care. I reached out to [name] by phone today to offer complex care management services.  [name] was given information about Complex Care Management services today including:   The Complex Care Management services include support from the care team which includes your Nurse Care Manager, Clinical Social Worker, or Pharmacist.  The Complex Care Management team is here to help remove barriers to the health concerns and goals most important to you. Complex Care Management services are voluntary, and the patient may decline or stop services at any time by request to their care team member.   Complex Care Management Consent Status: Patient did not agree to participate in complex care management services at this time.  Encounter Outcome:  Patient Refused  Kandis Ormond, CMA, Care Guide [facility]  [facility], [facility] Guide Direct Dial: [phone]  Fax: [phone] Website: [email or web address]

## 2023-03-04 ENCOUNTER — Other Ambulatory Visit: Payer: Self-pay | Admitting: Internal Medicine

## 2023-03-04 LAB — CUP PACEART REMOTE DEVICE CHECK
Date Time Interrogation Session: 20250209232126
Implantable Pulse Generator Implant Date: 20240329

## 2023-03-04 MED ORDER — OXYCODONE HCL 5 MG PO TABS: 5.0000 mg | ORAL_TABLET | Freq: Two times a day (BID) | ORAL | 0 refills | Status: DC | PRN

## 2023-03-04 NOTE — Telephone Encounter (Signed)
Copied from CRM [id number]. Topic: Clinical - Medication Refill >> Mar 04, 2023 10:38 AM Marica Otter wrote: Most Recent Primary Care Visit:  Provider: BURNS, Bobette Mo  Department: [facility]  Visit Type: HOSPITAL FOLLOW UP  Date: 02/27/2023  Medication: oxyCODONE (ROXICODONE) 5 MG immediate release tablet  Has the patient contacted their pharmacy? Yes, Needs doctor approval (Agent: If no, request that the patient contact the pharmacy for the refill. If patient does not wish to contact the pharmacy document the reason why and proceed with request.) (Agent: If yes, when and what did the pharmacy advise?)  Is this the correct pharmacy for this prescription? Yes If no, delete pharmacy and type the correct one.  This is the patient's preferred pharmacy:  [id number] - [place], [place] - [address] [place] [address] Phone: [phone] Fax: [phone]     Has the prescription been filled recently? Yes, 2/6  Is the patient out of the medication? Yes  Has the patient been seen for an appointment in the last year OR does the patient have an upcoming appointment? Yes  Can we respond through MyChart? No  Agent: Please be advised that Rx refills may take up to 3 business days. We ask that you follow-up with your pharmacy.

## 2023-03-10 ENCOUNTER — Encounter: Payer: Self-pay | Admitting: Internal Medicine

## 2023-03-10 NOTE — Addendum Note (Signed)
Addended by: Geralyn Flash D on: 03/10/2023 12:01 PM   Modules accepted: Orders

## 2023-03-10 NOTE — Progress Notes (Signed)
 Carelink Summary Report / Loop Recorder

## 2023-03-14 ENCOUNTER — Telehealth: Payer: Self-pay | Admitting: Internal Medicine

## 2023-03-14 NOTE — Telephone Encounter (Signed)
Patient following back up to speak with [name], please advise.

## 2023-03-14 NOTE — Telephone Encounter (Signed)
Pt c/o swelling/edema: STAT if pt has developed SOB within 24 hours  If swelling, where is the swelling located? Both ankles and feet  How much weight have you gained and in what time span? Loss weight  Have you gained 2 pounds in a day or 5 pounds in a week? no  Do you have a log of your daily weights (if so, list)? no  Are you currently taking a fluid pill? no  Are you currently SOB? no  Have you traveled recently in a car or plane for an extended period of time? no

## 2023-03-14 NOTE — Telephone Encounter (Addendum)
Pt called to report that she has been having some lower extremity bilateral edema for several weeks... up to her ankles only.. she has not been eating any added NA in her diet... she does not elevate her feet when she is sitting... she says she is breathing well and no edema in her abdomen or hands. She says she has lost a lot of weight and she is seeing her PCP next Wed 03/19/23 to talk about it.   She says she cannot wear her normal shoes due to them being tight.   She is unsure what her BP is but will check it this afternoon.   I advised her to watch her NA intake, be sure she is drinking fluids and she is not retaining because she is dry.. to elevate her legs when she is sitting.   I will send to Dr Tenny Craw.   Pt has hair appt today ay 3 pm so will need to be called thereafter.

## 2023-03-14 NOTE — Telephone Encounter (Signed)
Patient following back up to speak with [name]

## 2023-03-17 NOTE — Telephone Encounter (Signed)
 Pt is calling back to check status of this call

## 2023-03-17 NOTE — Progress Notes (Unsigned)
 Subjective:    Patient ID: Amy [name], female    DOB: 06-11-1935, 88 y.o.   MRN: [id number]     HPI Amy [name] is here for follow up of her chronic medical problems.  Back pain from compression fx - taking the pain medication  Oxycodone plus tylenol once a day prn - not taking it daily.    Activity - not very active.  Uses rollator.    Foot and lower leg swelling - x 2-3 weeks.  Most of the day she has her feet up.  No salt intake.  No change in meds x 4 weeks.    Drinks water.  Less urination.  At night urinates q 2 hrs - minimal urine  Medications and allergies reviewed with patient and updated if appropriate.  Current Outpatient Medications on File Prior to Visit  Medication Sig Dispense Refill   acetaminophen (TYLENOL) 500 MG tablet Take 2 tablets (1,000 mg total) by mouth every 6 (six) hours. 30 tablet 0   amiodarone (PACERONE) 200 MG tablet Take 1 tablet (200 mg total) by mouth daily. 90 tablet 3   amLODipine (NORVASC) 2.5 MG tablet Take one tablet (2.5 mg) by mouth daily and may take an extra tablet (2.5 mg) for BP staying greater than 160. 120 tablet 3   atenolol (TENORMIN) 25 MG tablet TAKE 1 TABLET (25 MG TOTAL) BY MOUTH DAILY. 90 tablet 3   Calcium Carbonate-Vitamin D (CALCIUM + D PO) Take 1 tablet by mouth daily.     dorzolamide (TRUSOPT) 2 % ophthalmic solution 1 drop every morning.     levothyroxine (SYNTHROID) 50 MCG tablet TAKE 1 TABLET BY MOUTH EVERY DAY 90 tablet 3   lidocaine (LIDODERM) 5 % Place 1 patch onto the skin daily. Remove & Discard patch within 12 hours or as directed by MD 30 patch 0   linaclotide (LINZESS) 72 MCG capsule Take 1 capsule (72 mcg total) by mouth as needed (for constipation). 30 capsule 0   melatonin 5 MG TABS Take 5 mg by mouth at bedtime.     methocarbamol (ROBAXIN) 500 MG tablet Take 1 tablet (500 mg total) by mouth 3 (three) times daily. (Patient taking differently: Take 500 mg by mouth as needed for muscle spasms.) 90 tablet 0    oxyCODONE (ROXICODONE) 5 MG immediate release tablet Take 1 tablet (5 mg total) by mouth every 12 (twelve) hours as needed for severe pain (pain score 7-10) (For chronic back pain). 30 tablet 0   polyethylene glycol (MIRALAX / GLYCOLAX) 17 g packet Take 17 g by mouth 2 (two) times daily. 14 each 0   Rivaroxaban (XARELTO) 15 MG TABS tablet Take 1 tablet (15 mg total) by mouth daily with supper. (Patient taking differently: Take 15 mg by mouth daily with supper. Pt takes every other day) 42 tablet    traMADol (ULTRAM) 50 MG tablet TAKE 1 TABLET BY MOUTH EVERY 8 HOURS AS NEEDED 90 tablet 0   ALPRAZolam (XANAX) 0.25 MG tablet Take 1 tablet (0.25 mg total) by mouth 2 (two) times daily as needed for anxiety. (Patient not taking: Reported on 03/18/2023) 20 tablet 0   brimonidine (ALPHAGAN) 0.2 % ophthalmic solution SMARTSIG:In Eye(s) (Patient not taking: Reported on 03/18/2023)     latanoprost (XALATAN) 0.005 % ophthalmic solution Place 1 drop into both eyes at bedtime. (Patient not taking: Reported on 03/18/2023) 2.5 mL 0   No current facility-administered medications on file prior to visit.  Review of Systems  Constitutional:  Negative for fever.  Respiratory:  Negative for cough, shortness of breath and wheezing.   Cardiovascular:  Positive for leg swelling. Negative for chest pain and palpitations.  Gastrointestinal:  Positive for constipation. Negative for abdominal pain and nausea.  Neurological:  Negative for headaches.       Objective:   Vitals:   03/18/23 1514  BP: 110/72  Pulse: 62  SpO2: 99%   BP Readings from Last 3 Encounters:  03/18/23 110/72  02/27/23 136/74  02/22/23 (!) 181/98   Wt Readings from Last 3 Encounters:  03/18/23 105 lb 9.6 oz (47.9 kg)  02/22/23 100 lb (45.4 kg)  02/03/23 105 lb (47.6 kg)   Body mass index is 16.54 kg/m.    Physical Exam Constitutional:      General: She is not in acute distress.    Appearance: Normal appearance.  HENT:      Head: Normocephalic and atraumatic.  Eyes:     Conjunctiva/sclera: Conjunctivae normal.  Cardiovascular:     Rate and Rhythm: Normal rate and regular rhythm.     Heart sounds: Normal heart sounds.  Pulmonary:     Effort: Pulmonary effort is normal. No respiratory distress.     Breath sounds: Normal breath sounds. No wheezing.  Musculoskeletal:     Cervical back: Neck supple.     Right lower leg: Edema (ankles and feet - 1+ pitting) present.     Left lower leg: Edema (ankles and feet - 1+ pitting) present.  Lymphadenopathy:     Cervical: No cervical adenopathy.  Skin:    General: Skin is warm and dry.     Findings: No rash.  Neurological:     Mental Status: She is alert. Mental status is at baseline.  Psychiatric:        Mood and Affect: Mood normal.        Behavior: Behavior normal.        Lab Results  Component Value Date   WBC 11.2 (H) 01/07/2023   HGB 11.7 01/07/2023   HCT 36.8 01/07/2023   PLT 226 01/07/2023   GLUCOSE 118 (H) 12/31/2022   CHOL 196 05/01/2022   TRIG 96.0 05/01/2022   HDL 93.50 05/01/2022   LDLDIRECT 84.9 11/15/2008   LDLCALC 83 05/01/2022   ALT 32 12/31/2022   AST 21 12/31/2022   NA 138 12/31/2022   K 4.6 12/31/2022   CL 101 12/31/2022   CREATININE 1.56 (H) 12/31/2022   BUN 28 (H) 12/31/2022   CO2 21 12/31/2022   TSH 3.570 12/31/2022   INR 1.0 07/18/2022   HGBA1C 4.8 05/01/2022     Assessment & Plan:    See Problem List for Assessment and Plan of chronic medical problems.

## 2023-03-18 ENCOUNTER — Telehealth: Payer: Self-pay

## 2023-03-18 ENCOUNTER — Ambulatory Visit: Payer: Medicare PPO | Admitting: Internal Medicine

## 2023-03-18 ENCOUNTER — Other Ambulatory Visit: Payer: Self-pay

## 2023-03-18 ENCOUNTER — Encounter: Payer: Self-pay | Admitting: Internal Medicine

## 2023-03-18 VITALS — BP 110/72 | HR 62 | Ht 67.0 in | Wt 105.6 lb

## 2023-03-18 DIAGNOSIS — S22050A Wedge compression fracture of T5-T6 vertebra, initial encounter for closed fracture: Secondary | ICD-10-CM | POA: Diagnosis not present

## 2023-03-18 DIAGNOSIS — I10 Essential (primary) hypertension: Secondary | ICD-10-CM | POA: Diagnosis not present

## 2023-03-18 DIAGNOSIS — M8000XA Age-related osteoporosis with current pathological fracture, unspecified site, initial encounter for fracture: Secondary | ICD-10-CM | POA: Diagnosis not present

## 2023-03-18 DIAGNOSIS — R600 Localized edema: Secondary | ICD-10-CM | POA: Insufficient documentation

## 2023-03-18 DIAGNOSIS — R636 Underweight: Secondary | ICD-10-CM

## 2023-03-18 DIAGNOSIS — R8290 Unspecified abnormal findings in urine: Secondary | ICD-10-CM

## 2023-03-18 DIAGNOSIS — I4821 Permanent atrial fibrillation: Secondary | ICD-10-CM | POA: Diagnosis not present

## 2023-03-18 DIAGNOSIS — E039 Hypothyroidism, unspecified: Secondary | ICD-10-CM | POA: Diagnosis not present

## 2023-03-18 DIAGNOSIS — N1832 Chronic kidney disease, stage 3b: Secondary | ICD-10-CM

## 2023-03-18 LAB — CBC WITH DIFFERENTIAL/PLATELET
Basophils Absolute: 0 10*3/uL (ref 0.0–0.1)
Basophils Relative: 0.5 % (ref 0.0–3.0)
Eosinophils Absolute: 0.1 10*3/uL (ref 0.0–0.7)
Eosinophils Relative: 0.9 % (ref 0.0–5.0)
HCT: 35.3 % — ABNORMAL LOW (ref 36.0–46.0)
Hemoglobin: 11.6 g/dL — ABNORMAL LOW (ref 12.0–15.0)
Lymphocytes Relative: 11.1 % — ABNORMAL LOW (ref 12.0–46.0)
Lymphs Abs: 1 10*3/uL (ref 0.7–4.0)
MCHC: 33 g/dL (ref 30.0–36.0)
MCV: 102.9 fL — ABNORMAL HIGH (ref 78.0–100.0)
Monocytes Absolute: 0.9 10*3/uL (ref 0.1–1.0)
Monocytes Relative: 10.2 % (ref 3.0–12.0)
Neutro Abs: 6.7 10*3/uL (ref 1.4–7.7)
Neutrophils Relative %: 77.3 % — ABNORMAL HIGH (ref 43.0–77.0)
Platelets: 328 10*3/uL (ref 150.0–400.0)
RBC: 3.43 Mil/uL — ABNORMAL LOW (ref 3.87–5.11)
RDW: 16.2 % — ABNORMAL HIGH (ref 11.5–15.5)
WBC: 8.7 10*3/uL (ref 4.0–10.5)

## 2023-03-18 LAB — COMPREHENSIVE METABOLIC PANEL
ALT: 24 U/L (ref 0–35)
AST: 25 U/L (ref 0–37)
Albumin: 4.2 g/dL (ref 3.5–5.2)
Alkaline Phosphatase: 173 U/L — ABNORMAL HIGH (ref 39–117)
BUN: 21 mg/dL (ref 6–23)
CO2: 25 meq/L (ref 19–32)
Calcium: 9.4 mg/dL (ref 8.4–10.5)
Chloride: 102 meq/L (ref 96–112)
Creatinine, Ser: 1.67 mg/dL — ABNORMAL HIGH (ref 0.40–1.20)
GFR: 27.74 mL/min — ABNORMAL LOW (ref >60.00)
Glucose, Bld: 102 mg/dL — ABNORMAL HIGH (ref 70–99)
Potassium: 4.7 meq/L (ref 3.5–5.1)
Sodium: 137 meq/L (ref 135–145)
Total Bilirubin: 0.4 mg/dL (ref 0.2–1.2)
Total Protein: 7.6 g/dL (ref 6.0–8.3)

## 2023-03-18 LAB — TSH: TSH: 24.5 u[IU]/mL — ABNORMAL HIGH (ref 0.35–5.50)

## 2023-03-18 MED ORDER — ROMOSOZUMAB-AQQG 105 MG/1.17ML ~~LOC~~ SOSY
210.0000 mg | PREFILLED_SYRINGE | Freq: Once | SUBCUTANEOUS | Status: AC
Start: 2023-03-18 — End: 2023-04-02
  Administered 2023-04-02: 210 mg via SUBCUTANEOUS

## 2023-03-18 NOTE — Assessment & Plan Note (Signed)
 Chronic Blood pressure well-controlled today Need to make sure she is not having orthostatic hypotension-at this point it is difficult to tell because she is very inactive Managed by cardiology Continue amlodipine 2.5 mg daily, atenolol 25 mg daily

## 2023-03-18 NOTE — Telephone Encounter (Signed)
 I called and spoke with the pt today and she is seeing Dr Lawerance Bach today... I made her a f/u appt with Dr Tenny Craw for 04/22/23.. she sounded much better on the phone today... not as "tired" and much less confused.

## 2023-03-18 NOTE — Telephone Encounter (Signed)
 Amy [name]-- FYI   You are going to see Amy [name] tomorrow   She has called out office a few times to tell me about her ankle swelling, foot swelling   With your appt tomorrow with her I did not schedule anything  Pt's husband told me she is moving much less, gets agitated more easily, does not want help in home My nurse [name] says that she sounded slow on the the phone, sluggish     Family lives out of town

## 2023-03-18 NOTE — Assessment & Plan Note (Signed)
 subacute Related to fall at home 02/22/2023 Pain is adequately controlled Pain management with Tylenol up to 3000 mg a day, oxycodone 5 mg daily prn -does not on a daily basis Okay to continue current regimen Discussed fall prevention Will be starting treatment for osteoporosis

## 2023-03-18 NOTE — Assessment & Plan Note (Addendum)
 Chronic Following with cardiology On atenolol and amiodarone, Xarelto 15 mg every other day

## 2023-03-18 NOTE — Assessment & Plan Note (Signed)
 Chronic Using rollator to prevent falls Encouraged PT, but she would like to work on exercising on her own Looking into Evenity-if so co-pay is reasonable will start Evenity x 1 year then transition to Prolia-if not reasonable will start with Prolia

## 2023-03-18 NOTE — Assessment & Plan Note (Signed)
 Chronic Check TSH Continue levothyroxine 50 mcg daily

## 2023-03-18 NOTE — Assessment & Plan Note (Signed)
 Chronic Has lost weight More recent weight has been stable She feels appetite has improved slightly, but her husband disagrees Stressed getting more protein in her diet which they are working on Stressed that she needs to be forced herself to be eating more Stressed combination of protein intake and exercising more to help build up some muscle mass

## 2023-03-18 NOTE — Telephone Encounter (Signed)
 Evenity VOB initiated via [email or web address]

## 2023-03-18 NOTE — Assessment & Plan Note (Signed)
 Chronic There has been a decrease in her kidney function ?  Increased swelling and decreased urination related to worsening kidney function CBC, CMP

## 2023-03-18 NOTE — Assessment & Plan Note (Signed)
 Acute Started about 3 weeks ago without obvious cause Denies any increase in sodium intake No change in medications She does sleep in the bed at night and during the day she elevates her legs No shortness of breath, but very sedentary so difficult to know for sure ?  Worsening kidney function versus other CMP, CBC, TSH

## 2023-03-18 NOTE — Patient Instructions (Addendum)
      Blood work was ordered.       Medications changes include :   None      Return in about 3 months (around 06/15/2023) for follow up.

## 2023-03-19 ENCOUNTER — Encounter: Payer: Self-pay | Admitting: Internal Medicine

## 2023-03-19 ENCOUNTER — Telehealth: Payer: Self-pay | Admitting: Internal Medicine

## 2023-03-19 DIAGNOSIS — E039 Hypothyroidism, unspecified: Secondary | ICD-10-CM

## 2023-03-19 LAB — URINALYSIS, ROUTINE W REFLEX MICROSCOPIC
Bilirubin Urine: NEGATIVE
Ketones, ur: NEGATIVE
Nitrite: NEGATIVE
Specific Gravity, Urine: 1.025 (ref 1.000–1.030)
Total Protein, Urine: 30 — AB
Urine Glucose: NEGATIVE
Urobilinogen, UA: 0.2 (ref 0.0–1.0)
pH: 6 (ref 5.0–8.0)

## 2023-03-19 LAB — MICROALBUMIN / CREATININE URINE RATIO
Creatinine,U: 151.2 mg/dL
Microalb Creat Ratio: 83.8 mg/g — ABNORMAL HIGH (ref 0.0–30.0)
Microalb, Ur: 12.7 mg/dL — ABNORMAL HIGH (ref 0.0–1.9)

## 2023-03-19 MED ORDER — LEVOTHYROXINE SODIUM 50 MCG PO TABS
100.0000 ug | ORAL_TABLET | Freq: Every day | ORAL | Status: DC
Start: 2023-03-19 — End: 2023-03-20

## 2023-03-19 NOTE — Telephone Encounter (Signed)
 Dr Tenny Craw - she has 1 + pitting edema ankles and feet.  No SOB.  She is elevating her legs, no change in diet.  Tsh is very elevated which is likely the cause.  I have adjusted the dose and will recheck her tsh in April.    Weight is stable.  Not moving much - deferred PT - wants to do it on her own.

## 2023-03-19 NOTE — Telephone Encounter (Signed)
 Your kidney function is decreased - it has decreased over the past few years.  We will discuss possibly seeing a kidney doctor at your next visit.   Liver tests are normal. Your have very mild anemia.    Your thyroid is underactive which is likely the cause of the leg swelling.  We need to increase the dose of your medication  -start taking 2 of the thyroid pills daily.  We will recheck your thyroid level at your next appointment.

## 2023-03-20 ENCOUNTER — Ambulatory Visit: Payer: Medicare PPO

## 2023-03-20 ENCOUNTER — Encounter: Payer: Self-pay | Admitting: Internal Medicine

## 2023-03-20 ENCOUNTER — Telehealth: Payer: Self-pay | Admitting: Internal Medicine

## 2023-03-20 MED ORDER — CEPHALEXIN 500 MG PO CAPS: 500.0000 mg | ORAL_CAPSULE | Freq: Two times a day (BID) | ORAL | 0 refills | Status: DC

## 2023-03-20 MED ORDER — LEVOTHYROXINE SODIUM 50 MCG PO TABS: 50.0000 ug | ORAL_TABLET | Freq: Every day | ORAL | Status: DC

## 2023-03-20 NOTE — Telephone Encounter (Signed)
 I spoke with the pt and advised her that we are waiting for Dr Lawerance Bach to review her labwork.. I will send to Dr Tenny Craw since the pt is still concerned about her pedal edema.

## 2023-03-20 NOTE — Addendum Note (Signed)
 Addended by: Pincus Sanes on: 03/20/2023 08:07 PM   Modules accepted: Orders

## 2023-03-20 NOTE — Telephone Encounter (Signed)
 Called pt and relayed PCP message. Pt reports since her BP has been low she has stopped all medication including thyroid medication since beginning of Feb. It has been at least 3 weeks since she has had levothyroxine and just wants to make sure it will be ok to start back taking it with 2 pills daily?

## 2023-03-20 NOTE — Telephone Encounter (Signed)
 I would recommend with BP being low that she stop amlodipine    Cut atenolol in 1/2   Follow BP and HR

## 2023-03-20 NOTE — Addendum Note (Signed)
 Addended by: Pincus Sanes on: 03/20/2023 12:20 PM   Modules accepted: Orders

## 2023-03-20 NOTE — Telephone Encounter (Signed)
 Patient called to follow-up on lab results.

## 2023-03-20 NOTE — Telephone Encounter (Signed)
 Since she has not been taking it -- she should start back one pill of the thyroid - we will recheck it when she is here in April.

## 2023-03-20 NOTE — Telephone Encounter (Signed)
 Called pt and relayed MD response, pt verbalized understanding and reports she will do that

## 2023-03-21 ENCOUNTER — Other Ambulatory Visit: Payer: Self-pay | Admitting: Internal Medicine

## 2023-03-21 ENCOUNTER — Telehealth: Payer: Self-pay

## 2023-03-21 NOTE — Telephone Encounter (Signed)
 Copied from CRM [id number]. Topic: General - Other >> Mar 21, 2023  1:47 PM Truddie Crumble wrote: Reason for CRM: patient returning call to doctor Burns nurse

## 2023-03-24 MED ORDER — ATENOLOL 25 MG PO TABS: 12.5000 mg | ORAL_TABLET | Freq: Every day | ORAL | Status: AC

## 2023-03-25 ENCOUNTER — Encounter: Payer: Self-pay | Admitting: Internal Medicine

## 2023-03-25 NOTE — Telephone Encounter (Signed)
 Marland Kitchen

## 2023-03-27 ENCOUNTER — Other Ambulatory Visit (HOSPITAL_COMMUNITY): Payer: Self-pay

## 2023-03-27 NOTE — Telephone Encounter (Signed)
 Pharmacy Patient Advocate Encounter   Received notification from  [employer] Portal  that prior authorization for Evenity is required/requested.   Insurance verification completed.   The patient is insured through [employer] .   Per test claim: PA required; PA submitted to above mentioned insurance via CoverMyMeds Key/confirmation #/EOC  [id number] Status is pending

## 2023-03-28 ENCOUNTER — Other Ambulatory Visit (HOSPITAL_COMMUNITY): Payer: Self-pay

## 2023-03-31 ENCOUNTER — Other Ambulatory Visit (HOSPITAL_COMMUNITY): Payer: Self-pay

## 2023-03-31 NOTE — Telephone Encounter (Signed)
 Pharmacy Patient Advocate Encounter  Received notification from [facility] that Prior Authorization for Amy [name] has been APPROVED from 03/27/23 to 01/21/24   PA #/Case ID/Reference #: [id number]  Approval letter indexed to media tab

## 2023-03-31 NOTE — Telephone Encounter (Signed)
 Pt ready for scheduling for Evenity on or after : 03/31/23  Option# 1 Buy/Bill (Office supplied medication)  Out-of-pocket cost due at time of  office visit: $40  Number of injection/visits approved: --  Primary: [employer] - Medicare Evenity co-insurance: $40 Admin fee co-insurance: 0%  Secondary: N/A Evenity co-insurance:  Admin fee co-insurance:   Medical Benefit Details: Date Benefits were checked: 03/18/23 Deductible: no/ Coinsurance: $40/ Admin Fee: 0%  Prior Auth: Approved PA# [id number] Expiration Date: 03/27/23 to 01/21/24  # of doses approved: ------------------------------------------------------------------------- Option# 2- Med Obtained from pharmacy  Pharmacy benefit: Copay $- (Paid to pharmacy) Admin Fee: - (Pay at clinic)  Prior Auth: - PA# Expiration Date:   # of doses approved:  If patient wants fill through the pharmacy benefit please send prescription to:  -- , and include estimated need by date in rx notes. Pharmacy will ship medication directly to the office.  Patient not eligible for Evenity Copay Card. Copay Card can make patient's cost as little as $25. Link to apply: [email or web address]://[email or web address].[email or web address]   This summary of benefits is an estimation of the patient's out-of-pocket cost. Exact cost may very based on individual plan coverage.

## 2023-04-01 ENCOUNTER — Encounter: Payer: Self-pay | Admitting: Internal Medicine

## 2023-04-01 NOTE — Progress Notes (Unsigned)
    Subjective:    Patient ID: [name], female    DOB: 22-Jun-1935, 88 y.o.   MRN: [id number]      HPI Elfa is here for No chief complaint on file.   2/25 edema 1+ feet and ankles     Medications and allergies reviewed with patient and updated if appropriate.  Current Outpatient Medications on File Prior to Visit  Medication Sig Dispense Refill   acetaminophen (TYLENOL) 500 MG tablet Take 2 tablets (1,000 mg total) by mouth every 6 (six) hours. 30 tablet 0   amiodarone (PACERONE) 200 MG tablet Take 1 tablet (200 mg total) by mouth daily. 90 tablet 3   atenolol (TENORMIN) 25 MG tablet Take 0.5 tablets (12.5 mg total) by mouth daily.     Calcium Carbonate-Vitamin D (CALCIUM + D PO) Take 1 tablet by mouth daily.     cephALEXin (KEFLEX) 500 MG capsule Take 1 capsule (500 mg total) by mouth 2 (two) times daily. 14 capsule 0   dorzolamide (TRUSOPT) 2 % ophthalmic solution 1 drop every morning.     levothyroxine (SYNTHROID) 50 MCG tablet Take 1 tablet (50 mcg total) by mouth daily.     lidocaine (LIDODERM) 5 % Place 1 patch onto the skin daily. Remove & Discard patch within 12 hours or as directed by MD 30 patch 0   linaclotide (LINZESS) 72 MCG capsule Take 1 capsule (72 mcg total) by mouth as needed (for constipation). 30 capsule 0   melatonin 5 MG TABS Take 5 mg by mouth at bedtime.     oxyCODONE (ROXICODONE) 5 MG immediate release tablet Take 1 tablet (5 mg total) by mouth every 12 (twelve) hours as needed for severe pain (pain score 7-10) (For chronic back pain). 30 tablet 0   polyethylene glycol (MIRALAX / GLYCOLAX) 17 g packet Take 17 g by mouth 2 (two) times daily. 14 each 0   Rivaroxaban (XARELTO) 15 MG TABS tablet Take 1 tablet (15 mg total) by mouth daily with supper. (Patient taking differently: Take 15 mg by mouth daily with supper. Pt takes every other day) 42 tablet    Current Facility-Administered Medications on File Prior to Visit  Medication Dose Route Frequency  Provider Last Rate Last Admin   Romosozumab-aqqg (EVENITY) 105 MG/1. injection 210 mg  210 mg Subcutaneous Once Pincus Sanes, MD        Review of Systems     Objective:  There were no vitals filed for this visit. BP Readings from Last 3 Encounters:  03/18/23 110/72  02/27/23 136/74  02/22/23 (!) 181/98   Wt Readings from Last 3 Encounters:  03/18/23 105 lb 9.6 oz (47.9 kg)  02/22/23 100 lb (45.4 kg)  02/03/23 105 lb (47.6 kg)   There is no height or weight on file to calculate BMI.    Physical Exam         Assessment & Plan:    See Problem List for Assessment and Plan of chronic medical problems.

## 2023-04-01 NOTE — Patient Instructions (Incomplete)
   Evenity injection given .   Blood work was ordered.       Medications changes include :   None    A referral was ordered for nephrology (kidney specialist) and someone will call you to schedule an appointment.     Return for 05/02/23 .

## 2023-04-02 ENCOUNTER — Ambulatory Visit: Admitting: Internal Medicine

## 2023-04-02 ENCOUNTER — Encounter: Payer: Self-pay | Admitting: Internal Medicine

## 2023-04-02 ENCOUNTER — Other Ambulatory Visit (HOSPITAL_COMMUNITY): Payer: Self-pay

## 2023-04-02 ENCOUNTER — Other Ambulatory Visit (INDEPENDENT_AMBULATORY_CARE_PROVIDER_SITE_OTHER)

## 2023-04-02 VITALS — BP 116/74 | HR 58 | Temp 97.7°F | Ht 67.0 in | Wt 107.0 lb

## 2023-04-02 DIAGNOSIS — N1832 Chronic kidney disease, stage 3b: Secondary | ICD-10-CM | POA: Diagnosis not present

## 2023-04-02 DIAGNOSIS — E039 Hypothyroidism, unspecified: Secondary | ICD-10-CM

## 2023-04-02 DIAGNOSIS — M8000XA Age-related osteoporosis with current pathological fracture, unspecified site, initial encounter for fracture: Secondary | ICD-10-CM | POA: Diagnosis not present

## 2023-04-02 DIAGNOSIS — R600 Localized edema: Secondary | ICD-10-CM

## 2023-04-02 LAB — BASIC METABOLIC PANEL
BUN: 21 mg/dL (ref 6–23)
CO2: 29 meq/L (ref 19–32)
Calcium: 9.6 mg/dL (ref 8.4–10.5)
Chloride: 100 meq/L (ref 96–112)
Creatinine, Ser: 1.45 mg/dL — ABNORMAL HIGH (ref 0.40–1.20)
GFR: 32.85 mL/min — ABNORMAL LOW (ref >60.00)
Glucose, Bld: 98 mg/dL (ref 70–99)
Potassium: 4.5 meq/L (ref 3.5–5.1)
Sodium: 136 meq/L (ref 135–145)

## 2023-04-02 MED ORDER — OXYCODONE HCL 5 MG PO TABS: 5.0000 mg | ORAL_TABLET | Freq: Every day | ORAL | 0 refills | Status: DC | PRN

## 2023-04-02 MED ORDER — ROMOSOZUMAB-AQQG 105 MG/1.17ML ~~LOC~~ SOSY: 210.0000 mg | PREFILLED_SYRINGE | Freq: Once | SUBCUTANEOUS | Status: AC

## 2023-04-02 NOTE — Assessment & Plan Note (Signed)
 Subacute Started about a month or so ago She is sleeping in bed with her legs horizontal and elevates her legs during the day She is moving around during the day Denies any increased sodium intake Kidney function has gotten worse Blood pressure variable Not taking any NSAIDs Advised to start wearing low strength compression socks Denies shortness of breath Thyroid not ideally controlled-she did stop the medication for a while, but has restarted BMP today Will recheck TSH in 1 month

## 2023-04-02 NOTE — Assessment & Plan Note (Addendum)
 Chronic Had stopped taking her medication and most recent TSH was very elevated This could also be contributing to her leg swelling Has restarted her medication and is taking daily Will recheck in 1 month

## 2023-04-02 NOTE — Assessment & Plan Note (Signed)
 GFR has worsened This could be contributing to her leg swelling Not taking any NSAIDs She has increased her water intake-may be drinking 2 bottles or glasses a day Nutrition slightly better BMP today Referral to nephrology

## 2023-04-02 NOTE — Assessment & Plan Note (Signed)
 Chronic Has had several fractures Agrees to start medication Evenity No. 1 today-will continue for 12 months and then transition to Prolia Fall precautions-uses rollator consistently

## 2023-04-07 ENCOUNTER — Ambulatory Visit (INDEPENDENT_AMBULATORY_CARE_PROVIDER_SITE_OTHER): Payer: Medicare PPO

## 2023-04-07 DIAGNOSIS — H401111 Primary open-angle glaucoma, right eye, mild stage: Secondary | ICD-10-CM | POA: Diagnosis not present

## 2023-04-07 DIAGNOSIS — R55 Syncope and collapse: Secondary | ICD-10-CM

## 2023-04-07 DIAGNOSIS — I480 Paroxysmal atrial fibrillation: Secondary | ICD-10-CM

## 2023-04-07 DIAGNOSIS — H40012 Open angle with borderline findings, low risk, left eye: Secondary | ICD-10-CM | POA: Diagnosis not present

## 2023-04-08 LAB — CUP PACEART REMOTE DEVICE CHECK
Date Time Interrogation Session: 20250316232019
Implantable Pulse Generator Implant Date: 20240329

## 2023-04-09 ENCOUNTER — Encounter: Payer: Self-pay | Admitting: Internal Medicine

## 2023-04-09 ENCOUNTER — Ambulatory Visit: Admitting: Internal Medicine

## 2023-04-09 MED ORDER — AMLODIPINE BESYLATE 2.5 MG PO TABS: ORAL_TABLET | ORAL | Status: DC

## 2023-04-10 NOTE — Addendum Note (Signed)
 Addended by: Geralyn Flash D on: 04/10/2023 04:02 PM   Modules accepted: Orders

## 2023-04-10 NOTE — Progress Notes (Signed)
 Carelink Summary Report / Loop Recorder

## 2023-04-14 ENCOUNTER — Ambulatory Visit (INDEPENDENT_AMBULATORY_CARE_PROVIDER_SITE_OTHER)

## 2023-04-14 VITALS — Ht 67.0 in | Wt 107.0 lb

## 2023-04-14 DIAGNOSIS — Z0000 Encounter for general adult medical examination without abnormal findings: Secondary | ICD-10-CM | POA: Diagnosis not present

## 2023-04-14 NOTE — Progress Notes (Signed)
 Subjective:   Amy [name] is a 88 y.o. who presents for a Medicare Wellness preventive visit.  Visit Complete: Virtual I connected with  Amy [name] on 04/14/23 by a audio enabled telemedicine application and verified that I am speaking with the correct person using two identifiers.  Patient Location: Home  Provider Location: Office/Clinic  I discussed the limitations of evaluation and management by telemedicine. The patient expressed understanding and agreed to proceed.  Vital Signs: Because this visit was a virtual/telehealth visit, some criteria may be missing or patient reported. Any vitals not documented were not able to be obtained and vitals that have been documented are patient reported.  VideoDeclined- This patient declined Librarian, academic. Therefore the visit was completed with audio only.  Persons Participating in Visit: Patient.  AWV Questionnaire: No: Patient Medicare AWV questionnaire was not completed prior to this visit.  Cardiac Risk Factors include: advanced age (>79men, >65 women);hypertension     Objective:    Today's Vitals   04/14/23 1345  Weight: 107 lb (48.5 kg)  Height: 5\' 7"  (1.702 m)   Body mass index is 16.76 kg/m.     04/14/2023    1:43 PM 02/22/2023    1:50 PM 01/23/2023    9:34 AM 07/18/2022    6:00 AM 07/04/2022   12:58 PM 06/18/2022    2:28 PM 06/18/2022    9:59 AM  Advanced Directives  Does Patient Have a Medical Advance Directive? Yes Yes No Yes Yes Yes Yes  Type of Estate agent of [place];Living will Healthcare Power of [place];Living will  Healthcare Power of [place];Living will Living will;Healthcare Power of Attorney Living will;Healthcare Power of [employer] Power of [place];Living will  Does patient want to make changes to medical advance directive? No - Patient declined   No - Patient declined No - Patient declined No - Patient declined   Copy of Healthcare Power  of Attorney in Chart? Yes - validated most recent copy scanned in chart (See row information)   No - copy requested No - copy requested No - copy requested No - copy requested    Current Medications (verified) Outpatient Encounter Medications as of 04/14/2023  Medication Sig   acetaminophen (TYLENOL) 500 MG tablet Take 2 tablets (1,000 mg total) by mouth every 6 (six) hours.   amiodarone (PACERONE) 200 MG tablet Take 1 tablet (200 mg total) by mouth daily.   amLODipine (NORVASC) 2.5 MG tablet Take one tablet (2.5 mg) by mouth daily and may take an extra tablet (2.5 mg) for BP staying greater than 160.   atenolol (TENORMIN) 25 MG tablet Take 0.5 tablets (12.5 mg total) by mouth daily.   Calcium Carbonate-Vitamin D (CALCIUM + D PO) Take 1 tablet by mouth daily.   dorzolamide (TRUSOPT) 2 % ophthalmic solution 1 drop every morning.   levothyroxine (SYNTHROID) 50 MCG tablet Take 1 tablet (50 mcg total) by mouth daily.   lidocaine (LIDODERM) 5 % Place 1 patch onto the skin daily. Remove & Discard patch within 12 hours or as directed by MD   linaclotide (LINZESS) 72 MCG capsule Take 1 capsule (72 mcg total) by mouth as needed (for constipation).   melatonin 5 MG TABS Take 5 mg by mouth at bedtime.   oxyCODONE (ROXICODONE) 5 MG immediate release tablet Take 1 tablet (5 mg total) by mouth daily as needed for severe pain (pain score 7-10) (For chronic back pain).   polyethylene glycol (MIRALAX / GLYCOLAX) 17  g packet Take 17 g by mouth 2 (two) times daily.   Rivaroxaban (XARELTO) 15 MG TABS tablet Take 1 tablet (15 mg total) by mouth daily with supper. (Patient taking differently: Take 15 mg by mouth daily with supper. Pt takes every other day)   [DISCONTINUED] mirtazapine (REMERON) 7.5 MG tablet    Facility-Administered Encounter Medications as of 04/14/2023  Medication   [START ON 04/16/2023] Romosozumab-aqqg (EVENITY) 105 MG/1. injection 210 mg    Allergies (verified) Patient has no known  allergies.   History: 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   Right Malar Cheek   Renal artery stenosis (HCC)    Past Surgical History:  Procedure Laterality Date   A FLUTTER ABLATION     KNEE ARTHROSCOPY Right    ORIF PELVIC FRACTURE WITH PERCUTANEOUS SCREWS N/A 07/19/2022   Procedure: ORIF PELVIC FRACTURE WITH PERCUTANEOUS SCREWS;  Surgeon: Roby Lofts, MD;  Location: [facility] OR;  Service: Orthopedics;  Laterality: N/A;   PERIPHERAL VASCULAR INTERVENTION Right 02/13/2021   Procedure: PERIPHERAL VASCULAR INTERVENTION;  Surgeon: Nada Libman, MD;  Location: [facility] INVASIVE CV LAB;  Service: Cardiovascular;  Laterality: Right;   RENAL ANGIOGRAPHY N/A 02/13/2021   Procedure: RENAL ANGIOGRAPHY;  Surgeon: Nada Libman, MD;  Location: [facility] INVASIVE CV LAB;  Service: Cardiovascular;  Laterality: N/A;   TUBAL LIGATION     Family History  Problem Relation Age of Onset   Stroke Father 90       died from stroke at age 44   Stroke Mother 84       died from stroke at age 69   Heart disease Mother    Colon cancer Brother    Social History   Socioeconomic History   Marital status: Married    Spouse name: Not on file   Number of children: 1   Years of education: Not on file   Highest education level: Not on file  Occupational History   Occupation: Retired  Tobacco Use   Smoking status: Never    Passive exposure: Never   Smokeless tobacco: Never  Vaping Use   Vaping status: Never Used  Substance and Sexual Activity   Alcohol use: No   Drug use: No   Sexual activity: Not on file  Other Topics Concern   Not on file  Social History Narrative   Occ caffeine    Social Drivers of Health   Financial Resource Strain: Low Risk  (04/14/2023)    Overall Financial Resource Strain (CARDIA)    Difficulty of Paying Living Expenses: Not hard at all  Food Insecurity: No Food Insecurity (04/14/2023)   Hunger Vital Sign    Worried About Running Out of Food in the Last Year: Never true    Ran Out of Food in the Last Year: Never true  Transportation Needs: No Transportation Needs (04/14/2023)   PRAPARE - Administrator, Civil Service (Medical): No    Lack of Transportation (Non-Medical): No  Physical Activity: Insufficiently Active (04/14/2023)   Exercise Vital Sign    Days of Exercise per Week: 1 day    Minutes of Exercise per Session: 10 min  Stress: No Stress Concern  Present (04/14/2023)   [employer] of Occupational Health - Occupational Stress Questionnaire    Feeling of Stress : Not at all  Social Connections: Socially Integrated (04/14/2023)   Social Connection and Isolation Panel [NHANES]    Frequency of Communication with Friends and Family: More than three times a week    Frequency of Social Gatherings with Friends and Family: More than three times a week    Attends Religious Services: More than 4 times per year    Active Member of [employer] or Organizations: Yes    Attends Engineer, structural: More than 4 times per year    Marital Status: Married    Tobacco Counseling Counseling given: No    Clinical Intake:  Pre-visit preparation completed: Yes  Pain : No/denies pain     BMI - recorded: 16.76 Nutritional Status: BMI <19  Underweight Nutritional Risks: None Diabetes: No  Lab Results  Component Value Date   HGBA1C 4.8 05/01/2022     How often do you need to have someone help you when you read instructions, pamphlets, or other written materials from your doctor or pharmacy?: 1 - Never  Interpreter Needed?: No  Information entered by :: Hassell Halim, CMA   Activities of Daily Living     04/14/2023    1:47 PM 07/18/2022    6:00 AM  In your present state of health, do you have any  difficulty performing the following activities:  Hearing? 0 0  Vision? 0 0  Difficulty concentrating or making decisions? 0 0  Walking or climbing stairs? 0 1  Dressing or bathing? 0 0  Doing errands, shopping? 0 0  Preparing Food and eating ? N   Using the Toilet? N   In the past six months, have you accidently leaked urine? N   Do you have problems with loss of bowel control? N   Managing your Medications? N   Managing your Finances? N   Housekeeping or managing your Housekeeping? N     Patient Care Team: Pincus Sanes, MD as PCP - General (Internal Medicine) Pricilla Riffle, MD as PCP - Cardiology (Cardiology) Glyn Ade, PA-C as Physician Assistant (Dermatology) Diona Foley, MD as Consulting Physician (Ophthalmology)  Indicate any recent Medical Services you may have received from other than [facility] providers in the past year (date may be approximate).     Assessment:   This is a routine wellness examination for Amy [name].  Hearing/Vision screen Hearing Screening - Comments:: Denies hearing difficulties   Vision Screening - Comments:: Wears eye contact lenses with [facility] - Dr Benjamine Mola   Goals Addressed               This Visit's Progress     Patient Stated (pt-stated)        Patient stated she plans to stay active.       Depression Screen     04/14/2023    1:51 PM 02/03/2023    9:09 AM 01/17/2023    3:28 PM 09/04/2022    2:13 PM 07/17/2022    1:24 PM 05/01/2022    1:59 PM 05/01/2022    1:58 PM  PHQ 2/9 Scores  PHQ - 2 Score 0 0 0 5 2 0 0  PHQ- 9 Score 0   10 16 1      Fall Risk     04/14/2023    1:48 PM 02/03/2023    9:08 AM 01/17/2023    3:28 PM 09/04/2022  2:13 PM 07/17/2022    1:23 PM  Fall Risk   Falls in the past year? 1 1 0 1 1  Number falls in past yr: 1 0 0 1 0  Comment 4      Injury with Fall? 0 1 0 1 1  Comment     fracture right pubic, left side pubic hurts  Risk for fall due to : No Fall Risks;Impaired balance/gait No Fall  Risks No Fall Risks History of fall(s) Other (Comment)  Follow up Falls prevention discussed;Falls evaluation completed Falls evaluation completed Falls evaluation completed Falls evaluation completed Falls evaluation completed    MEDICARE RISK AT HOME:  Medicare Risk at Home Any stairs in or around the home?: No If so, are there any without handrails?: No Home free of loose throw rugs in walkways, pet beds, electrical cords, etc?: Yes Adequate lighting in your home to reduce risk of falls?: Yes Life alert?: No Use of a cane, walker or w/c?: Yes (walker) Grab bars in the bathroom?: No Shower chair or bench in shower?: Yes Elevated toilet seat or a handicapped toilet?: Yes  TIMED UP AND GO:  Was the test performed?  No  Cognitive Function: 6CIT completed        04/14/2023    1:51 PM 06/26/2021    4:17 PM  6CIT Screen  What Year? 0 points 0 points  What month? 0 points 0 points  What time? 0 points 0 points  Count back from 20 0 points 0 points  Months in reverse 0 points 0 points  Repeat phrase 2 points 0 points  Total Score 2 points 0 points    Immunizations Immunization History  Administered Date(s) Administered   PFIZER(Purple Top)SARS-COV-2 Vaccination 02/14/2019, 03/08/2019, 11/20/2019    Screening Tests Health Maintenance  Topic Date Due   DTaP/Tdap/Td (1 - Tdap) Never done   Zoster Vaccines- Shingrix (1 of 2) Never done   COVID-19 Vaccine (4 - 2024-25 season) 09/22/2022   Pneumonia Vaccine 53+ Years old (1 of 2 - PCV) 05/01/2023 (Originally 07/28/1941)   Medicare Annual Wellness (AWV)  04/13/2024   HPV VACCINES  Aged Out   INFLUENZA VACCINE  Discontinued   DEXA SCAN  Discontinued   Colonoscopy  Discontinued    Health Maintenance  Health Maintenance Due  Topic Date Due   DTaP/Tdap/Td (1 - Tdap) Never done   Zoster Vaccines- Shingrix (1 of 2) Never done   COVID-19 Vaccine (4 - 2024-25 season) 09/22/2022   Health Maintenance Items  Addressed:   Additional Screening:  Vision Screening: Recommended annual ophthalmology exams for early detection of glaucoma and other disorders of the eye.  Dental Screening: Recommended annual dental exams for proper oral hygiene  Community Resource Referral / Chronic Care Management: CRR required this visit?  No   CCM required this visit?  No     Plan:     I have personally reviewed and noted the following in the patient's chart:   Medical and social history Use of alcohol, tobacco or illicit drugs  Current medications and supplements including opioid prescriptions. Patient is currently taking opioid prescriptions. Information provided to patient regarding non-opioid alternatives. Patient advised to discuss non-opioid treatment plan with their provider. Functional ability and status Nutritional status Physical activity Advanced directives List of other physicians Hospitalizations, surgeries, and ER visits in previous 12 months Vitals Screenings to include cognitive, depression, and falls Referrals and appointments  In addition, I have reviewed and discussed with patient certain preventive protocols,  quality metrics, and best practice recommendations. A written personalized care plan for preventive services as well as general preventive health recommendations were provided to patient.     Darreld Mclean, CMA   04/14/2023   After Visit Summary: (MyChart) Due to this being a telephonic visit, the after visit summary with patients personalized plan was offered to patient via MyChart   Notes: Nothing significant to report at this time.

## 2023-04-14 NOTE — Patient Instructions (Addendum)
 Amy [name] , Thank you for taking time to come for your Medicare Wellness Visit. I appreciate your ongoing commitment to your health goals. Please review the following plan we discussed and let me know if I can assist you in the future.   Referrals/Orders/Follow-Ups/Clinician Recommendations: Aim for 30 minutes of exercise or brisk walking, 6-8 glasses of water, and 5 servings of fruits and vegetables each day.   This is a list of the screening recommended for you and due dates:  Health Maintenance  Topic Date Due   DTaP/Tdap/Td vaccine (1 - Tdap) Never done   Zoster (Shingles) Vaccine (1 of 2) Never done   COVID-19 Vaccine (4 - 2024-25 season) 09/22/2022   Pneumonia Vaccine (1 of 2 - PCV) 05/01/2023*   Medicare Annual Wellness Visit  04/13/2024   HPV Vaccine  Aged Out   Flu Shot  Discontinued   DEXA scan (bone density measurement)  Discontinued   Colon Cancer Screening  Discontinued  *Topic was postponed. The date shown is not the original due date.    Advanced directives: (In Chart) A copy of your advanced directives are scanned into your chart should your provider ever need it.  Next Medicare Annual Wellness Visit scheduled for next year: Yes   Managing Pain Without Opioids Opioids are strong medicines used to treat moderate to severe pain. For some people, especially those who have long-term (chronic) pain, opioids may not be the best choice for pain management due to: Side effects like nausea, constipation, and sleepiness. The risk of addiction (opioid use disorder). The longer you take opioids, the greater your risk of addiction. Pain that lasts for more than 3 months is called chronic pain. Managing chronic pain usually requires more than one approach and is often provided by a team of health care providers working together (multidisciplinary approach). Pain management may be done at a pain management center or pain clinic. How to manage pain without the use of opioids Use  non-opioid medicines Non-opioid medicines for pain may include: Over-the-counter or prescription non-steroidal anti-inflammatory drugs (NSAIDs). These may be the first medicines used for pain. They work well for muscle and bone pain, and they reduce swelling. Acetaminophen. This over-the-counter medicine may work well for milder pain but not swelling. Antidepressants. These may be used to treat chronic pain. A certain type of antidepressant (tricyclics) is often used. These medicines are given in lower doses for pain than when used for depression. Anticonvulsants. These are usually used to treat seizures but may also reduce nerve (neuropathic) pain. Muscle relaxants. These relieve pain caused by sudden muscle tightening (spasms). You may also use a pain medicine that is applied to the skin as a patch, cream, or gel (topical analgesic), such as a numbing medicine. These may cause fewer side effects than medicines taken by mouth. Do certain therapies as directed Some therapies can help with pain management. They include: Physical therapy. You will do exercises to gain strength and flexibility. A physical therapist may teach you exercises to move and stretch parts of your body that are weak, stiff, or painful. You can learn these exercises at physical therapy visits and practice them at home. Physical therapy may also involve: Massage. Heat wraps or applying heat or cold to affected areas. Electrical signals that interrupt pain signals (transcutaneous electrical nerve stimulation, TENS). Weak lasers that reduce pain and swelling (low-level laser therapy). Signals from your body that help you learn to regulate pain (biofeedback). Occupational therapy. This helps you to learn ways to  function at home and work with less pain. Recreational therapy. This involves trying new activities or hobbies, such as a physical activity or drawing. Mental health therapy, including: Cognitive behavioral therapy (CBT).  This helps you learn coping skills for dealing with pain. Acceptance and commitment therapy (ACT) to change the way you think and react to pain. Relaxation therapies, including muscle relaxation exercises and mindfulness-based stress reduction. Pain management counseling. This may be individual, family, or group counseling.  Receive medical treatments Medical treatments for pain management include: Nerve block injections. These may include a pain blocker and anti-inflammatory medicines. You may have injections: Near the spine to relieve chronic back or neck pain. Into joints to relieve back or joint pain. Into nerve areas that supply a painful area to relieve body pain. Into muscles (trigger point injections) to relieve some painful muscle conditions. A medical device placed near your spine to help block pain signals and relieve nerve pain or chronic back pain (spinal cord stimulation device). Acupuncture. Follow these instructions at home Medicines Take over-the-counter and prescription medicines only as told by your health care provider. If you are taking pain medicine, ask your health care providers about possible side effects to watch out for. Do not drive or use heavy machinery while taking prescription opioid pain medicine. Lifestyle  Do not use drugs or alcohol to reduce pain. If you drink alcohol, limit how much you have to: 0-1 drink a day for women who are not pregnant. 0-2 drinks a day for men. Know how much alcohol is in a drink. In the [place] one drink equals one 12 oz bottle of beer (355 mL), one 5 oz glass of wine (148 mL), or one 1 oz glass of hard liquor (44 mL). Do not use any products that contain nicotine or tobacco. These products include cigarettes, chewing tobacco, and vaping devices, such as e-cigarettes. If you need help quitting, ask your health care provider. Eat a healthy diet and maintain a healthy weight. Poor diet and excess weight may make pain worse. Eat  foods that are high in fiber. These include fresh fruits and vegetables, whole grains, and beans. Limit foods that are high in fat and processed sugars, such as fried and sweet foods. Exercise regularly. Exercise lowers stress and may help relieve pain. Ask your health care provider what activities and exercises are safe for you. If your health care provider approves, join an exercise class that combines movement and stress reduction. Examples include yoga and tai chi. Get enough sleep. Lack of sleep may make pain worse. Lower stress as much as possible. Practice stress reduction techniques as told by your therapist. General instructions Work with all your pain management providers to find the treatments that work best for you. You are an important member of your pain management team. There are many things you can do to reduce pain on your own. Consider joining an online or in-person support group for people who have chronic pain. Keep all follow-up visits. This is important. Where to find more information You can find more information about managing pain without opioids from: American Academy of Pain Medicine: painmed.org Institute for Chronic Pain: instituteforchronicpain.org American Chronic Pain Association: theacpa.org Contact a health care provider if: You have side effects from pain medicine. Your pain gets worse or does not get better with treatments or home therapy. You are struggling with anxiety or depression. Summary Many types of pain can be managed without opioids. Chronic pain may respond better to pain management without  opioids. Pain is best managed when you and a team of health care providers work together. Pain management without opioids may include non-opioid medicines, medical treatments, physical therapy, mental health therapy, and lifestyle changes. Tell your health care providers if your pain gets worse or is not being managed well enough. This information is not  intended to replace advice given to you by your health care provider. Make sure you discuss any questions you have with your health care provider. Document Revised: 04/19/2020 Document Reviewed: 04/19/2020 Elsevier Patient Education  2024 [employer].

## 2023-04-16 NOTE — Telephone Encounter (Signed)
 Agree   Take amlodipine if BP over 150/ Please try to get records from [facility]

## 2023-04-16 NOTE — Telephone Encounter (Addendum)
 Pt is still having lower extremity edema.Marland KitchenMarland KitchenDr Lawerance Bach last note from 04/02/23:  GFR has worsened This could be contributing to her leg swelling Not taking any NSAIDs She has increased her water intake-may be drinking 2 bottles or glasses a day Nutrition slightly better BMP today Referral to nephrology      I will [facility] today to see if I can get her appt made sooner than later. Pt will let me know what her BP is running and I will talk with Dr Tenny Craw.   Pt has OV 04/22/23.    Per [facility].. they will be calling the pt soon to make her appt.

## 2023-04-17 ENCOUNTER — Telehealth: Payer: Self-pay | Admitting: Internal Medicine

## 2023-04-17 NOTE — Telephone Encounter (Signed)
 Spoke with patients husband, he is concerned as his wife's swelling is worsening. Bilateral feet/leg swelling, right leg and foot is worse than the left. States her feet have a blue/purple like tint to them from being so red. He did reach out to [facility], and he was told they have to review patients case first. He wants to know if there are any other options at the moment, patient is scheduled to see cardiology on 03/31 and PCP on 04/11.

## 2023-04-17 NOTE — Telephone Encounter (Signed)
 Try stopping the compression socks.  We can try a low-dose water pill to see if that helps a little bit with the swelling, but could drop her BP slightly so she would need to be very careful about not falling.  Let me know if they want to try that.

## 2023-04-17 NOTE — Telephone Encounter (Signed)
 Copied from CRM [id number]. Topic: Clinical - Medical Advice >> Apr 17, 2023 11:18 AM Elizebeth Brooking wrote: Reason for CRM: Patient husband called stating he would like for Dr.Burns or her nurse to call him back

## 2023-04-20 NOTE — Progress Notes (Unsigned)
 Cardiology Office Note   Date:  04/21/2023   ID:  Amy [name], DOB 1935/11/15, MRN [id number]  PCP:  Pincus Sanes, MD  Cardiologist:   Dietrich Pates, MD    Pt presents for follow up of atrial fib, HTN and syncope  History of Present Illness: Amy [name] is a 88 y.o. female with a history of atrial flutter (s/p ablation 2008), atrial fibrillation, syncope, HTN, RAS (s/p PTA/stent to R renal artery Jan 2023) Oct 2023   ER visit for severe HTN  USN of renal arteries showed >60% R renal artery stenosis. -Jan 2024  SBP 120-150   Amldodipine decreased to 5 mg ; Pt switched from losartan to olmesartan 40mg   -Feb 14, 2022:  ER visit for severe BP elevation.  Given clonidine and IV fluids    In SR at times  On walking out of ER, pt  had a syncopal spell   No prodome.   Suffered maxillary, orbital and L olecranon fracture    Admitted for BP control     -Feb 2024  Seen by Margaretha Glassing   BP 126  P 50  (on amlodipine 2.5; clonidine 0.1 tid; hydralazine 100 tid; losartan 50 bid).  Carotid USN mild CVdz on R carotid  Zio patch showed afib 91% time  Average HR 77 bpm -March 2024  Seen by Rosette Reveal   ILR placed    -May 2024   [name]   Suffered pelvic fracture    Worsened with ramus and other fractures    Underwnt surgical repair. - Pt seen BP 158/80   Interrogation of ILR showed very high rates   Based on this pt started on amiodarone 200 mg bid  I saw the pt in Sept 2024     Bp at home very labile  110s to 180s   Mostly in 140s    Couple days 170s     Has taken 2.5 mg amlodpine if BP over 160     Denies palpitations  Does brace herself to get dressed    No presyncope / syncope  I last saw the pt in Dec 2024 Since seen, the patient was seen for a fall in February 2023 (ER) she denied syncope. She continues to have problems with lower extremity swelling.  Note TSH was checked earlier this winter it was elevated and thyroid was adjusted.  Patient says her swelling gets worse as the day goes on comes down  in the morning.  She denies chest pain.  No shortness of breath.  No palpitations.  She is frustrated by the swelling in her legs. The patient's blood pressure is labile at home running 120s to 170 systolic.  She has stopped taking her blood pressure medicines because she says it does not help The patient says she is taking her Xarelto every other day because she bruises too much   Outpatient Medications Prior to Visit  Medication Sig Dispense Refill   acetaminophen (TYLENOL) 500 MG tablet Take 2 tablets (1,000 mg total) by mouth every 6 (six) hours. 30 tablet 0   amiodarone (PACERONE) 200 MG tablet Take 1 tablet (200 mg total) by mouth daily. 90 tablet 3   atenolol (TENORMIN) 25 MG tablet Take 0.5 tablets (12.5 mg total) by mouth daily.     Calcium Carbonate-Vitamin D (CALCIUM + D PO) Take 1 tablet by mouth daily.     dorzolamide (TRUSOPT) 2 % ophthalmic solution 1 drop every morning.     levothyroxine (  SYNTHROID) 50 MCG tablet Take 1 tablet (50 mcg total) by mouth daily.     lidocaine (LIDODERM) 5 % Place 1 patch onto the skin daily. Remove & Discard patch within 12 hours or as directed by MD 30 patch 0   linaclotide (LINZESS) 72 MCG capsule Take 1 capsule (72 mcg total) by mouth as needed (for constipation). 30 capsule 0   melatonin 5 MG TABS Take 5 mg by mouth at bedtime.     oxyCODONE (ROXICODONE) 5 MG immediate release tablet Take 1 tablet (5 mg total) by mouth daily as needed for severe pain (pain score 7-10) (For chronic back pain). 30 tablet 0   polyethylene glycol (MIRALAX / GLYCOLAX) 17 g packet Take 17 g by mouth 2 (two) times daily. 14 each 0   Rivaroxaban (XARELTO) 15 MG TABS tablet Take 1 tablet (15 mg total) by mouth daily with supper. (Patient taking differently: Take 15 mg by mouth daily with supper. Pt takes every other day) 42 tablet    amLODipine (NORVASC) 2.5 MG tablet Take one tablet (2.5 mg) by mouth daily and may take an extra tablet (2.5 mg) for BP staying greater than  160.     Facility-Administered Medications Prior to Visit  Medication Dose Route Frequency Provider Last Rate Last Admin   Romosozumab-aqqg (EVENITY) 105 MG/1. injection 210 mg  210 mg Subcutaneous Once Pincus Sanes, MD         Allergies:   Patient has no known allergies.   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   Right Malar Cheek   Renal artery stenosis (HCC)     Past Surgical History:  Procedure Laterality Date   A FLUTTER ABLATION     KNEE ARTHROSCOPY Right    ORIF PELVIC FRACTURE WITH PERCUTANEOUS SCREWS N/A 07/19/2022   Procedure: ORIF PELVIC FRACTURE WITH PERCUTANEOUS SCREWS;  Surgeon: Roby Lofts, MD;  Location: [facility] OR;  Service: Orthopedics;  Laterality: N/A;   PERIPHERAL VASCULAR INTERVENTION Right 02/13/2021   Procedure: PERIPHERAL VASCULAR INTERVENTION;  Surgeon: Nada Libman, MD;  Location: [facility] INVASIVE CV LAB;  Service: Cardiovascular;  Laterality: Right;   RENAL ANGIOGRAPHY N/A 02/13/2021   Procedure: RENAL ANGIOGRAPHY;  Surgeon: Nada Libman, MD;  Location: [facility] INVASIVE CV LAB;  Service: Cardiovascular;  Laterality: N/A;   TUBAL LIGATION       Social History:  The patient  reports that she has never smoked. She has never been exposed to tobacco smoke. She has never used smokeless tobacco. She reports that she does not drink alcohol and does not use drugs.   Family History:  The patient's family history includes Colon cancer in her brother; Heart disease in her mother; Stroke (age of onset: 27) in her mother; Stroke (age of onset: 55) in her father.    ROS:  Please see the history of present illness. All other systems are reviewed and  Negative to the above problem except as noted.    PHYSICAL EXAM: VS:   BP 130/80   Pulse 69   Ht 5\' 7"  (1.702 m)   Wt 48.3 kg   LMP  (LMP Unknown)   SpO2 96%   BMI  16.66 kg/m      Blood pressure on my check is 160/80 with office cuff and her cuff  GEN: Thin 88 yo who is in NAD  Examined in chair   HEENT.  Face a little puffy Neck: JVP is not elevated     Cardiac:Irreg irreg ; no murmur  Respiratory:  clear to auscultation bilaterally  Extremities: 1+ lower extremity edema.  Bruising noted on limbs   EKG:  EKG not done  today   Event monitor   Jan 2024  Predominant rhythm:  Atrial fibrillation 91% of time   Rates 47 to 133 bpm   Average HR 77 bpm   Rare PVC  Carotid USN  Jan 2024  Right Carotid: Velocities in the right ICA are consistent with a 1-39%  stenosis.  Left Carotid: There is no evidence of stenosis in the left ICA. The  extracranial vessels were near-normal with only minimal wall thickening or plaque.  Vertebrals:  Bilateral vertebral arteries demonstrate antegrade flow.  Subclavians: Normal flow hemodynamics were seen in bilateral subclavian arteries.    Echo   02/15/22 1. Left ventricular ejection fraction, by estimation, is 65 to 70%. The  left ventricle has normal function. The left ventricle has no regional  wall motion abnormalities. Left ventricular diastolic parameters are  consistent with Grade II diastolic  dysfunction (pseudonormalization).   2. Right ventricular systolic function is normal. The right ventricular  size is normal. There is moderately elevated pulmonary artery systolic  pressure. The estimated right ventricular systolic pressure is 54.0 mmHg.   3. Left atrial size was moderately dilated.   4. A small pericardial effusion is present. The pericardial effusion is  posterior to the left ventricle.   5. The mitral valve is degenerative. Mild mitral valve regurgitation.   6. Tricuspid valve regurgitation is mild to moderate.   7. The aortic valve is tricuspid. There is mild calcification of the  aortic  valve. Aortic valve regurgitation is trivial. Aortic valve  sclerosis/calcification is present, without any evidence of aortic  stenosis.   8. The inferior vena cava is normal in size with greater than 50%  respiratory variability, suggesting right atrial pressure of 3 mmHg.   Renal Artery [place] 11/20/21 Summary:  Renal:    Right: Evidence of a > 60% stenosis of the right renal artery. RRV         flow present. Abnormal size for the right kidney. Abnormal         right Resistive Index. Normal cortical thickness of right         kidney.  Left:  No evidence of left renal artery stenosis. LRV flow present.         Abnormal size for the left kidney. Abnormal left Resisitve         Index. Normal cortical thickness of the left kidney.  Mesenteric:  Normal Celiac artery findings. 70 to 99% stenosis in the superior  mesenteric  artery. Areas of limited visceral study include right kidney size, left  kidney  size and right parenchymal flow.    Summary: Renal: Right: Abnormal right Resistive Index. Normal cortical thickness of right kidney. Probable occlusion of the right renal artery. Left: Abnormal left Resisitve Index. Normal cortical thickness of the left kidney. Normal size of left kidney. No evidence of left renal artery stenosis. LRV flow present. Mesenteric: Normal Celiac artery findings. 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   Lipid Panel    Component Value Date/Time  CHOL 196 05/01/2022 1450   CHOL 219 (H) 10/12/2020 1617   TRIG 96.0 05/01/2022 1450   HDL 93.50 05/01/2022 1450   HDL 95 10/12/2020 1617   CHOLHDL 2 05/01/2022 1450   VLDL 19.2 05/01/2022 1450   LDLCALC 83 05/01/2022 1450   LDLCALC 109 (H) 10/12/2020 1617   LDLDIRECT 84.9 11/15/2008 0924      Wt Readings from Last 3 Encounters:  04/21/23 48.3 kg  04/14/23 48.5 kg  04/02/23 48.5 kg      ASSESSMENT AND PLAN:  1 edema: Patient does have lower extremity edema as well as some facial  swelling.  Overall though her volume status does not appear bad.   Will repeat TSH to see if indeed it is improving.  I would also check her albumin.    2 atrial fibrllation patient is stopped her amiodarone. She continues Xarelto every other day.  I told her this should be a daily drug as without it she is at increased risk for stroke.   2  HTN  BP remains very labile   I again have recommended that she take amlodipine 2.5 for elevated blood pressure.  3  Syncope   One spell when in ER at [facility]   No prodrome  ILR in place   4   Lipids   LDL 83 in April 2024.  5 debility.  Patient appears weak.  Her husband says she shuffles at home with her walker.  She does have significant valgus deformity with her gait.  Encouraged her to use bands and to stand more, keep muscles in her legs tone. Overall she does appear to have declined since I last saw her  Plans for follow-up later this summer  Current medicines are reviewed at length with the patient today.  The patient does not have concerns regarding medicines.  Signed, Dietrich Pates, MD  04/21/2023 10:49 PM    [facility] [address] [place], [place], [place]  [address] Phone: [phone]; Fax: [phone]

## 2023-04-21 ENCOUNTER — Ambulatory Visit: Attending: Internal Medicine | Admitting: Internal Medicine

## 2023-04-21 ENCOUNTER — Encounter: Payer: Self-pay | Admitting: Internal Medicine

## 2023-04-21 VITALS — BP 130/80 | HR 69 | Ht 67.0 in | Wt 106.4 lb

## 2023-04-21 DIAGNOSIS — I10 Essential (primary) hypertension: Secondary | ICD-10-CM | POA: Diagnosis not present

## 2023-04-21 DIAGNOSIS — Z79899 Other long term (current) drug therapy: Secondary | ICD-10-CM | POA: Diagnosis not present

## 2023-04-21 DIAGNOSIS — I4891 Unspecified atrial fibrillation: Secondary | ICD-10-CM | POA: Diagnosis not present

## 2023-04-21 DIAGNOSIS — M7989 Other specified soft tissue disorders: Secondary | ICD-10-CM

## 2023-04-21 DIAGNOSIS — E039 Hypothyroidism, unspecified: Secondary | ICD-10-CM | POA: Diagnosis not present

## 2023-04-21 DIAGNOSIS — D649 Anemia, unspecified: Secondary | ICD-10-CM

## 2023-04-21 DIAGNOSIS — R600 Localized edema: Secondary | ICD-10-CM | POA: Diagnosis not present

## 2023-04-21 MED ORDER — AMLODIPINE BESYLATE 2.5 MG PO TABS: ORAL_TABLET | ORAL | 1 refills | Status: DC

## 2023-04-21 NOTE — Patient Instructions (Signed)
 Medication Instructions:   Your physician recommends that you continue on your current medications as directed. Please refer to the Current Medication list given to you today.  *If you need a refill on your cardiac medications before your next appointment, please call your pharmacy*  Lab Work:  TODAY--DOWNSTAIRS FIRST FLOOR OF THIS BUILDING--TSH, CMET, CBC W DIFF, PRO-BNP  If you have labs (blood work) drawn today and your tests are completely normal, you will receive your results only by: MyChart Message (if you have MyChart) OR A paper copy in the mail If you have any lab test that is abnormal or we need to change your treatment, we will call you to review the results.   Testing/Procedures:  Your physician has requested that you have an echocardiogram. Echocardiography is a painless test that uses sound waves to create images of your heart. It provides your doctor with information about the size and shape of your heart and how well your heart's chambers and valves are working. This procedure takes approximately one hour. There are no restrictions for this procedure. Please do NOT wear cologne, perfume, aftershave, or lotions (deodorant is allowed). Please arrive 15 minutes prior to your appointment time.  Please note: We ask at that you not bring children with you during ultrasound (echo/ vascular) testing. Due to room size and safety concerns, children are not allowed in the ultrasound rooms during exams. Our front office staff cannot provide observation of children in our lobby area while testing is being conducted. An adult accompanying a patient to their appointment will only be allowed in the ultrasound room at the discretion of the ultrasound technician under special circumstances. We apologize for any inconvenience.   Follow-Up:  SOMETIME IN JULY 2025 WITH DR ROSS         1st Floor: - Lobby - Registration  - Pharmacy  - Lab - Cafe  2nd Floor: - PV Lab - Diagnostic  Testing (echo, CT, nuclear med)  3rd Floor: - Vacant  4th Floor: - TCTS (cardiothoracic surgery) - AFib Clinic - Structural Heart Clinic - Vascular Surgery  - Vascular Ultrasound  5th Floor: - [facility] Cardiology (general and EP) - Clinical Pharmacy for coumadin, hypertension, lipid, weight-loss medications, and med management appointments    Valet parking services will be available as well.

## 2023-04-21 NOTE — Telephone Encounter (Signed)
 Spoke with spouse and patient seeing Dr. Tenny Craw today so he would like to hold off.

## 2023-04-22 ENCOUNTER — Ambulatory Visit: Payer: Medicare PPO | Admitting: Internal Medicine

## 2023-04-22 LAB — CBC WITH DIFFERENTIAL/PLATELET
Basophils Absolute: 0.1 10*3/uL (ref 0.0–0.2)
Basos: 1 %
EOS (ABSOLUTE): 0 10*3/uL (ref 0.0–0.4)
Eos: 0 %
Hematocrit: 37 % (ref 34.0–46.6)
Hemoglobin: 12.1 g/dL (ref 11.1–15.9)
Immature Grans (Abs): 0.1 10*3/uL (ref 0.0–0.1)
Immature Granulocytes: 1 %
Lymphocytes Absolute: 1.5 10*3/uL (ref 0.7–3.1)
Lymphs: 21 %
MCH: 33.3 pg — ABNORMAL HIGH (ref 26.6–33.0)
MCHC: 32.7 g/dL (ref 31.5–35.7)
MCV: 102 fL — ABNORMAL HIGH (ref 79–97)
Monocytes Absolute: 0.6 10*3/uL (ref 0.1–0.9)
Monocytes: 8 %
Neutrophils Absolute: 5 10*3/uL (ref 1.4–7.0)
Neutrophils: 69 %
Platelets: 252 10*3/uL (ref 150–450)
RBC: 3.63 x10E6/uL — ABNORMAL LOW (ref 3.77–5.28)
RDW: 12.4 % (ref 11.7–15.4)
WBC: 7.2 10*3/uL (ref 3.4–10.8)

## 2023-04-22 LAB — COMPREHENSIVE METABOLIC PANEL WITH GFR
ALT: 27 IU/L (ref 0–32)
AST: 28 IU/L (ref 0–40)
Albumin: 4.5 g/dL (ref 3.7–4.7)
Alkaline Phosphatase: 236 IU/L — ABNORMAL HIGH (ref 44–121)
BUN/Creatinine Ratio: 14 (ref 12–28)
BUN: 21 mg/dL (ref 8–27)
Bilirubin Total: 0.4 mg/dL (ref 0.0–1.2)
CO2: 21 mmol/L (ref 20–29)
Calcium: 9.5 mg/dL (ref 8.7–10.3)
Chloride: 103 mmol/L (ref 96–106)
Creatinine, Ser: 1.46 mg/dL — ABNORMAL HIGH (ref 0.57–1.00)
Globulin, Total: 2.6 g/dL (ref 1.5–4.5)
Glucose: 142 mg/dL — ABNORMAL HIGH (ref 70–99)
Potassium: 4 mmol/L (ref 3.5–5.2)
Sodium: 139 mmol/L (ref 134–144)
Total Protein: 7.1 g/dL (ref 6.0–8.5)
eGFR: 35 mL/min/{1.73_m2} — ABNORMAL LOW (ref >59)

## 2023-04-22 LAB — PRO B NATRIURETIC PEPTIDE: NT-Pro BNP: 881 pg/mL — ABNORMAL HIGH (ref 0–738)

## 2023-04-22 LAB — TSH: TSH: 18 u[IU]/mL — ABNORMAL HIGH (ref 0.450–4.500)

## 2023-04-24 ENCOUNTER — Encounter: Payer: Self-pay | Admitting: Internal Medicine

## 2023-04-24 ENCOUNTER — Other Ambulatory Visit: Payer: Self-pay | Admitting: Internal Medicine

## 2023-04-24 MED ORDER — LEVOTHYROXINE SODIUM 88 MCG PO TABS: 88.0000 ug | ORAL_TABLET | Freq: Every day | ORAL | 3 refills | Status: AC

## 2023-04-24 NOTE — Telephone Encounter (Signed)
 Spoke with pt regarding her blood pressure readings. Pt states she is not having any symptoms. Pt was asked about her diet and she stated that she has been eating processed foods and that her eating those foods does correlate with her high blood pressures. Pt was advised to refrain from salty and processed foods. Pt was also advised to continue monitoring her blood pressure and to make sure she is taking it an hour after she has taken her medications. Pt told that the information she provided would be forwarded to Dr. Tenny Craw and her nurse for advise. Pt told to go to the ED if she has any visual changes, headache, weakness or confusion. Pt verbalized understanding. All questions, if any, were answered.

## 2023-04-25 ENCOUNTER — Other Ambulatory Visit: Payer: Self-pay

## 2023-04-25 DIAGNOSIS — M8000XA Age-related osteoporosis with current pathological fracture, unspecified site, initial encounter for fracture: Secondary | ICD-10-CM

## 2023-04-25 MED ORDER — ROMOSOZUMAB-AQQG 105 MG/1.17ML ~~LOC~~ SOSY
210.0000 mg | PREFILLED_SYRINGE | Freq: Once | SUBCUTANEOUS | Status: AC
  Administered 2023-05-02: 210 mg via SUBCUTANEOUS

## 2023-04-25 NOTE — Telephone Encounter (Signed)
 Encourage patient to keep following BP    Have amlodipine to take as needed  Avoid salty, processed foods

## 2023-05-01 ENCOUNTER — Encounter: Payer: Self-pay | Admitting: Internal Medicine

## 2023-05-01 NOTE — Patient Instructions (Addendum)
 Blood work was ordered.   Have this done when you come next month for your evenity shot.      Medications changes include :   trazodone for sleep 25-50 mg at bedtime     Return in about 6 months (around 11/01/2023) for follow up.   Health Maintenance, Female Adopting a healthy lifestyle and getting preventive care are important in promoting health and wellness. Ask your health care provider about: The right schedule for you to have regular tests and exams. Things you can do on your own to prevent diseases and keep yourself healthy. What should I know about diet, weight, and exercise? Eat a healthy diet  Eat a diet that includes plenty of vegetables, fruits, low-fat dairy products, and lean protein. Do not eat a lot of foods that are high in solid fats, added sugars, or sodium. Maintain a healthy weight Body mass index (BMI) is used to identify weight problems. It estimates body fat based on height and weight. Your health care provider can help determine your BMI and help you achieve or maintain a healthy weight. Get regular exercise Get regular exercise. This is one of the most important things you can do for your health. Most adults should: Exercise for at least 150 minutes each week. The exercise should increase your heart rate and make you sweat (moderate-intensity exercise). Do strengthening exercises at least twice a week. This is in addition to the moderate-intensity exercise. Spend less time sitting. Even light physical activity can be beneficial. Watch cholesterol and blood lipids Have your blood tested for lipids and cholesterol at 86 years of age, then have this test every 5 years. Have your cholesterol levels checked more often if: Your lipid or cholesterol levels are high. You are older than 86 years of age. You are at high risk for heart disease. What should I know about cancer screening? Depending on your health history and family history, you may need to  have cancer screening at various ages. This may include screening for: Breast cancer. Cervical cancer. Colorectal cancer. Skin cancer. Lung cancer. What should I know about heart disease, diabetes, and high blood pressure? Blood pressure and heart disease High blood pressure causes heart disease and increases the risk of stroke. This is more likely to develop in people who have high blood pressure readings or are overweight. Have your blood pressure checked: Every 3-5 years if you are 20-51 years of age. Every year if you are 63 years old or older. Diabetes Have regular diabetes screenings. This checks your fasting blood sugar level. Have the screening done: Once every three years after age 10 if you are at a normal weight and have a low risk for diabetes. More often and at a younger age if you are overweight or have a high risk for diabetes. What should I know about preventing infection? Hepatitis B If you have a higher risk for hepatitis B, you should be screened for this virus. Talk with your health care provider to find out if you are at risk for hepatitis B infection. Hepatitis C Testing is recommended for: Everyone born from [birth year] through 1963. Anyone with known risk factors for hepatitis C. Sexually transmitted infections (STIs) Get screened for STIs, including gonorrhea and chlamydia, if: You are sexually active and are younger than 86 years of age. You are older than 86 years of age and your health care provider tells you that you are at risk for this type of infection. Your  sexual activity has changed since you were last screened, and you are at increased risk for chlamydia or gonorrhea. Ask your health care provider if you are at risk. Ask your health care provider about whether you are at high risk for HIV. Your health care provider may recommend a prescription medicine to help prevent HIV infection. If you choose to take medicine to prevent HIV, you should first get tested for  HIV. You should then be tested every 3 months for as long as you are taking the medicine. Pregnancy If you are about to stop having your period (premenopausal) and you may become pregnant, seek counseling before you get pregnant. Take 400 to 800 micrograms (mcg) of folic acid every day if you become pregnant. Ask for birth control (contraception) if you want to prevent pregnancy. Osteoporosis and menopause Osteoporosis is a disease in which the bones lose minerals and strength with aging. This can result in bone fractures. If you are 58 years old or older, or if you are at risk for osteoporosis and fractures, ask your health care provider if you should: Be screened for bone loss. Take a calcium or vitamin D supplement to lower your risk of fractures. Be given hormone replacement therapy (HRT) to treat symptoms of menopause. Follow these instructions at home: Alcohol use Do not drink alcohol if: Your health care provider tells you not to drink. You are pregnant, may be pregnant, or are planning to become pregnant. If you drink alcohol: Limit how much you have to: 0-1 drink a day. Know how much alcohol is in your drink. In the [place] one drink equals one 12 oz bottle of beer (355 mL), one 5 oz glass of wine (148 mL), or one 1 oz glass of hard liquor (44 mL). Lifestyle Do not use any products that contain nicotine or tobacco. These products include cigarettes, chewing tobacco, and vaping devices, such as e-cigarettes. If you need help quitting, ask your health care provider. Do not use street drugs. Do not share needles. Ask your health care provider for help if you need support or information about quitting drugs. General instructions Schedule regular health, dental, and eye exams. Stay current with your vaccines. Tell your health care provider if: You often feel depressed. You have ever been abused or do not feel safe at home. Summary Adopting a healthy lifestyle and getting preventive  care are important in promoting health and wellness. Follow your health care provider's instructions about healthy diet, exercising, and getting tested or screened for diseases. Follow your health care provider's instructions on monitoring your cholesterol and blood pressure. This information is not intended to replace advice given to you by your health care provider. Make sure you discuss any questions you have with your health care provider. Document Revised: 05/29/2020 Document Reviewed: 05/29/2020 Elsevier Patient Education  2024 [employer].

## 2023-05-01 NOTE — Progress Notes (Unsigned)
 Subjective:    Patient ID: Amy [name], female    DOB: [birth year], 88 y.o.   MRN: [id number]      HPI Amy [name] is here for a Physical exam and her chronic medical problems.    Has had difficulty sleeping - last night did not sleep at all.  Very few nights where she is up 3-4 hrs. she typically does not feel tired throughout the day which she finds surprising.  No falls.  Using rollator  BP is good at home.    Edema is better controlled  Has knee pain - wonder if she can use voltaren gel  Medications and allergies reviewed with patient and updated if appropriate.  Current Outpatient Medications on File Prior to Visit  Medication Sig Dispense Refill   acetaminophen (TYLENOL) 500 MG tablet Take 2 tablets (1,000 mg total) by mouth every 6 (six) hours. 30 tablet 0   amLODipine (NORVASC) 2.5 MG tablet Take one tablet (2.5 mg) by mouth daily and may take an extra tablet (2.5 mg) for BP staying greater than 160. 90 tablet 1   atenolol (TENORMIN) 25 MG tablet Take 0.5 tablets (12.5 mg total) by mouth daily.     Calcium Carbonate-Vitamin D (CALCIUM + D PO) Take 1 tablet by mouth daily.     dorzolamide (TRUSOPT) 2 % ophthalmic solution 1 drop every morning.     levothyroxine (SYNTHROID) 88 MCG tablet Take 1 tablet (88 mcg total) by mouth daily. 90 tablet 3   lidocaine (LIDODERM) 5 % Place 1 patch onto the skin daily. Remove & Discard patch within 12 hours or as directed by MD 30 patch 0   linaclotide (LINZESS) 72 MCG capsule Take 1 capsule (72 mcg total) by mouth as needed (for constipation). 30 capsule 0   melatonin 5 MG TABS Take 5 mg by mouth at bedtime.     oxyCODONE (ROXICODONE) 5 MG immediate release tablet Take 1 tablet (5 mg total) by mouth daily as needed for severe pain (pain score 7-10) (For chronic back pain). 30 tablet 0   polyethylene glycol (MIRALAX / GLYCOLAX) 17 g packet Take 17 g by mouth 2 (two) times daily. 14 each 0   Rivaroxaban (XARELTO) 15 MG TABS tablet Take 1  tablet (15 mg total) by mouth daily with supper. (Patient taking differently: Take 15 mg by mouth daily with supper. Pt takes every other day) 42 tablet    Current Facility-Administered Medications on File Prior to Visit  Medication Dose Route Frequency Provider Last Rate Last Admin   Romosozumab-aqqg (EVENITY) 105 MG/1. injection 210 mg  210 mg Subcutaneous Once Pincus Sanes, MD        Review of Systems  Constitutional:  Negative for fever.  HENT:  Negative for trouble swallowing.   Eyes:  Negative for visual disturbance.  Respiratory:  Negative for cough, shortness of breath and wheezing.   Cardiovascular:  Positive for leg swelling (mild - much better controlled). Negative for chest pain and palpitations.  Gastrointestinal:  Positive for constipation. Negative for abdominal pain, blood in stool and diarrhea.       No gerd  Genitourinary:  Positive for decreased urine volume. Negative for dysuria.  Musculoskeletal:  Positive for arthralgias and back pain (moderate with standing - none with sitting).  Skin:  Negative for rash.  Neurological:  Negative for light-headedness and headaches.  Psychiatric/Behavioral:  Negative for dysphoric mood. The patient is nervous/anxious.        Objective:  Vitals:   05/02/23 1302  BP: 134/74  Pulse: 78  Temp: 98 F (36.7 C)  SpO2: 96%   Filed Weights   05/02/23 1302  Weight: 106 lb (48.1 kg)   Body mass index is 16.6 kg/m.  BP Readings from Last 3 Encounters:  05/02/23 134/74  04/21/23 130/80  04/02/23 116/74    Wt Readings from Last 3 Encounters:  05/02/23 106 lb (48.1 kg)  04/21/23 106 lb 6.4 oz (48.3 kg)  04/14/23 107 lb (48.5 kg)       Physical Exam Constitutional: She appears well-developed and well-nourished. No distress.  HENT:  Head: Normocephalic and atraumatic.  Right Ear: External ear normal. Normal ear canal and TM Left Ear: External ear normal.  Normal ear canal and TM Mouth/Throat: Oropharynx is clear  and moist.  Eyes: Conjunctivae normal.  Neck: Neck supple. No tracheal deviation present. No thyromegaly present.  No carotid bruit  Cardiovascular: Normal rate, regular rhythm and normal heart sounds.   No murmur heard.  Trace b/l LE  edema. Pulmonary/Chest: Effort normal and breath sounds normal. No respiratory distress. She has no wheezes. She has no rales.  Breast: deferred   Abdominal: Soft. She exhibits no distension. There is no tenderness.  Lymphadenopathy: She has no cervical adenopathy.  Skin: Skin is warm and dry. She is not diaphoretic.  Psychiatric: She has a normal mood and affect. Her behavior is normal.     Lab Results  Component Value Date   WBC 7.2 04/21/2023   HGB 12.1 04/21/2023   HCT 37.0 04/21/2023   PLT 252 04/21/2023   GLUCOSE 142 (H) 04/21/2023   CHOL 196 05/01/2022   TRIG 96.0 05/01/2022   HDL 93.50 05/01/2022   LDLDIRECT 84.9 11/15/2008   LDLCALC 83 05/01/2022   ALT 27 04/21/2023   AST 28 04/21/2023   NA 139 04/21/2023   K 4.0 04/21/2023   CL 103 04/21/2023   CREATININE 1.46 (H) 04/21/2023   BUN 21 04/21/2023   CO2 21 04/21/2023   TSH 18.000 (H) 04/21/2023   INR 1.0 07/18/2022   HGBA1C 4.8 05/01/2022   MICROALBUR 12.7 (H) 03/18/2023         Assessment & Plan:   Physical exam: Screening blood work  ordered Exercise  minimal Weight  low - trying to increase weight Substance abuse  none   Reviewed recommended immunizations.   Health Maintenance  Topic Date Due   DTaP/Tdap/Td (1 - Tdap) Never done   Pneumonia Vaccine 79+ Years old (1 of 2 - PCV) Never done   Zoster Vaccines- Shingrix (1 of 2) Never done   COVID-19 Vaccine (4 - 2024-25 season) 09/22/2022   Medicare Annual Wellness (AWV)  04/13/2024   HPV VACCINES  Aged Out   Meningococcal B Vaccine  Aged Out   INFLUENZA VACCINE  Discontinued   DEXA SCAN  Discontinued   Colonoscopy  Discontinued          See Problem List for Assessment and Plan of chronic medical  problems.

## 2023-05-02 ENCOUNTER — Ambulatory Visit: Payer: Medicare PPO | Admitting: Internal Medicine

## 2023-05-02 VITALS — BP 134/74 | HR 78 | Temp 98.0°F | Ht 67.0 in | Wt 106.0 lb

## 2023-05-02 DIAGNOSIS — E039 Hypothyroidism, unspecified: Secondary | ICD-10-CM

## 2023-05-02 DIAGNOSIS — Z0000 Encounter for general adult medical examination without abnormal findings: Secondary | ICD-10-CM | POA: Diagnosis not present

## 2023-05-02 DIAGNOSIS — S22050D Wedge compression fracture of T5-T6 vertebra, subsequent encounter for fracture with routine healing: Secondary | ICD-10-CM

## 2023-05-02 DIAGNOSIS — R5381 Other malaise: Secondary | ICD-10-CM

## 2023-05-02 DIAGNOSIS — F32A Depression, unspecified: Secondary | ICD-10-CM

## 2023-05-02 DIAGNOSIS — E538 Deficiency of other specified B group vitamins: Secondary | ICD-10-CM | POA: Diagnosis not present

## 2023-05-02 DIAGNOSIS — N1832 Chronic kidney disease, stage 3b: Secondary | ICD-10-CM

## 2023-05-02 DIAGNOSIS — M8000XA Age-related osteoporosis with current pathological fracture, unspecified site, initial encounter for fracture: Secondary | ICD-10-CM

## 2023-05-02 DIAGNOSIS — F419 Anxiety disorder, unspecified: Secondary | ICD-10-CM | POA: Diagnosis not present

## 2023-05-02 DIAGNOSIS — I4821 Permanent atrial fibrillation: Secondary | ICD-10-CM

## 2023-05-02 DIAGNOSIS — R600 Localized edema: Secondary | ICD-10-CM

## 2023-05-02 DIAGNOSIS — I10 Essential (primary) hypertension: Secondary | ICD-10-CM

## 2023-05-02 DIAGNOSIS — E43 Unspecified severe protein-calorie malnutrition: Secondary | ICD-10-CM

## 2023-05-02 MED ORDER — ROMOSOZUMAB-AQQG 105 MG/1.17ML ~~LOC~~ SOSY: 210.0000 mg | PREFILLED_SYRINGE | Freq: Once | SUBCUTANEOUS | Status: AC

## 2023-05-02 MED ORDER — TRAZODONE HCL 50 MG PO TABS: 25.0000 mg | ORAL_TABLET | Freq: Every day | ORAL | 5 refills | Status: DC

## 2023-05-02 NOTE — Assessment & Plan Note (Signed)
 Chronic Blood pressure well-controlled today Stressed that she should continue her current medications daily Continue amlodipine 2.5 mg daily, atenolol 12.5 mg daily

## 2023-05-02 NOTE — Assessment & Plan Note (Signed)
 Chronic She did stop taking her medication for a while and medication dose has been adjusted Medication dose just adjusted-recheck TSH in 1 month Continue levothyroxine 88 mcg daily

## 2023-05-02 NOTE — Assessment & Plan Note (Signed)
 Chronic Continue oral B12 1000 mcg daily Check B12 level

## 2023-05-02 NOTE — Assessment & Plan Note (Signed)
 Chronic Following with cardiology On atenolol 12.5 mg daily, Xarelto 15 mg every day

## 2023-05-02 NOTE — Assessment & Plan Note (Signed)
 GFR has worsened This could be contributing to her leg swelling Not taking any NSAIDs She has increased her water intake Has been referred to nephrology-awaiting appointment Nutrition slightly better CMP in 1 month

## 2023-05-02 NOTE — Assessment & Plan Note (Signed)
 Chronic She is chronically anxious, but denies depression Not currently on any medication for anxiety and would like to avoid it At 1 point she was on alprazolam and I do not think that is a great option for her Will monitor

## 2023-05-02 NOTE — Assessment & Plan Note (Signed)
 Chronic With frequent falls Currently using the walker-rollator in the house all the time-stressed that she needs to use this continuously Encouraged her to be as active as possible

## 2023-05-02 NOTE — Assessment & Plan Note (Addendum)
 Chronic Improved Decreased kidney function, recent uncontrolled thyroid likely both contributing Elevate legs when sitting Continue to be active Low-sodium diet Maintain good water intake Not taking any NSAIDs Blood pressure is well-controlled Will get thyroid function controlled

## 2023-05-02 NOTE — Assessment & Plan Note (Signed)
 Chronic Has had several fractures Evenity No. 2 today-plan to continue for 12 months and then transition to Prolia Fall precautions-uses rollator consistently and advised to continue to use that Continue calcium and vitamin D supplementation Check vitamin D level in 1 month

## 2023-05-02 NOTE — Assessment & Plan Note (Signed)
 Chronic Nutrition has improved and her appetite is better and she is eating better Weight is stable Stressed increasing protein-she will look into getting some protein shakes and drink them to help increase her protein

## 2023-05-02 NOTE — Assessment & Plan Note (Signed)
 subacute Related to fall at home 02/22/2023 Pain is adequately controlled Pain management with Tylenol up to 3000 mg a day, oxycodone 5 mg daily prn -does not on a daily basis Okay to continue current regimen Discussed fall prevention-continue to use rollator Continue Evenity monthly

## 2023-05-08 ENCOUNTER — Telehealth: Payer: Self-pay | Admitting: Internal Medicine

## 2023-05-08 NOTE — Telephone Encounter (Signed)
 What is getting worse - the leg swelling?  It was pretty good the last time she was here.

## 2023-05-08 NOTE — Telephone Encounter (Signed)
 Copied from CRM [id number]. Topic: Referral - Question >> May 07, 2023  4:18 PM [name] wrote: Reason for CRM: Patient was referred to [facility] but has been placed on a list for scheduling. She is getting worse and does not think she can wait to be seen. She was wondering if there was another place that she may be referred to for Nephrology. She also asked that you please contact her to let her know if this is possible. Thank you.

## 2023-05-09 ENCOUNTER — Encounter: Payer: Self-pay | Admitting: Internal Medicine

## 2023-05-09 DIAGNOSIS — I83893 Varicose veins of bilateral lower extremities with other complications: Secondary | ICD-10-CM

## 2023-05-09 NOTE — Telephone Encounter (Signed)
 Message sent via [employer]

## 2023-05-12 ENCOUNTER — Ambulatory Visit (INDEPENDENT_AMBULATORY_CARE_PROVIDER_SITE_OTHER): Payer: Medicare PPO

## 2023-05-12 DIAGNOSIS — R55 Syncope and collapse: Secondary | ICD-10-CM

## 2023-05-12 DIAGNOSIS — I4891 Unspecified atrial fibrillation: Secondary | ICD-10-CM

## 2023-05-13 LAB — CUP PACEART REMOTE DEVICE CHECK
Date Time Interrogation Session: 20250420232001
Implantable Pulse Generator Implant Date: 20240329

## 2023-05-15 ENCOUNTER — Other Ambulatory Visit: Payer: Self-pay | Admitting: Internal Medicine

## 2023-05-15 ENCOUNTER — Encounter: Payer: Self-pay | Admitting: Internal Medicine

## 2023-05-15 MED ORDER — OXYCODONE HCL 5 MG PO TABS: 5.0000 mg | ORAL_TABLET | Freq: Every day | ORAL | 0 refills | Status: DC | PRN

## 2023-05-15 NOTE — Telephone Encounter (Signed)
 Copied from CRM [id number]. Topic: Clinical - Medication Refill >> May 15, 2023  1:21 PM Freya Jesus wrote: Most Recent Primary Care Visit:  Provider: Colene Dauphin  Department: [facility]  Visit Type: PHYSICAL  Date: 05/02/2023  Medication: oxyCODONE  (ROXICODONE ) 5 MG immediate release tablet [[id number]]  Has the patient contacted their pharmacy? Yes (Agent: If no, request that the patient contact the pharmacy for the refill. If patient does not wish to contact the pharmacy document the reason why and proceed with request.) (Agent: If yes, when and what did the pharmacy advise?)Pharmacy said they will not fill.  Is this the correct pharmacy for this prescription? Yes If no, delete pharmacy and type the correct one.  This is the patient's preferred pharmacy:  [id number] - [place], [place] - [address] [place] [address] Phone: [phone] Fax: [phone]   Has the prescription been filled recently? Yes  Is the patient out of the medication? No, 3 left  Has the patient been seen for an appointment in the last year OR does the patient have an upcoming appointment? Yes  Can we respond through MyChart? Yes  Agent: Please be advised that Rx refills may take up to 3 business days. We ask that you follow-up with your pharmacy.

## 2023-05-19 ENCOUNTER — Ambulatory Visit (HOSPITAL_COMMUNITY): Attending: Cardiovascular Disease

## 2023-05-19 DIAGNOSIS — I10 Essential (primary) hypertension: Secondary | ICD-10-CM | POA: Diagnosis not present

## 2023-05-19 DIAGNOSIS — M7989 Other specified soft tissue disorders: Secondary | ICD-10-CM | POA: Insufficient documentation

## 2023-05-19 DIAGNOSIS — R600 Localized edema: Secondary | ICD-10-CM

## 2023-05-19 LAB — ECHOCARDIOGRAM COMPLETE
Area-P 1/2: 4.6 cm2
MV M vel: 5.93 m/s
MV Peak grad: 140.7 mmHg
Radius: 0.7 cm
S' Lateral: 2.7 cm

## 2023-05-20 ENCOUNTER — Encounter: Payer: Self-pay | Admitting: Internal Medicine

## 2023-05-22 MED ORDER — FUROSEMIDE 40 MG PO TABS
40.0000 mg | ORAL_TABLET | ORAL | 0 refills | Status: AC
Start: 2023-05-22 — End: ?

## 2023-05-22 NOTE — Telephone Encounter (Signed)
 I called and spoke with the pt re: her Echo results... she will keep her 06/05/23 HF clinic appt.

## 2023-05-26 NOTE — Progress Notes (Signed)
 Carelink Summary Report / Loop Recorder

## 2023-05-26 NOTE — Addendum Note (Signed)
 Addended by: Edra Govern D on: 05/26/2023 02:50 PM   Modules accepted: Orders

## 2023-05-28 ENCOUNTER — Other Ambulatory Visit: Payer: Self-pay

## 2023-05-28 DIAGNOSIS — I872 Venous insufficiency (chronic) (peripheral): Secondary | ICD-10-CM

## 2023-06-02 ENCOUNTER — Other Ambulatory Visit: Payer: Self-pay | Admitting: Adult Health

## 2023-06-05 ENCOUNTER — Ambulatory Visit (HOSPITAL_COMMUNITY)
Admission: RE | Admit: 2023-06-05 | Discharge: 2023-06-05 | Disposition: A | Discharge status: Home / Self Care | Source: Ambulatory Visit | Attending: Vascular Surgery | Admitting: Vascular Surgery

## 2023-06-05 DIAGNOSIS — I872 Venous insufficiency (chronic) (peripheral): Secondary | ICD-10-CM | POA: Diagnosis not present

## 2023-06-17 ENCOUNTER — Ambulatory Visit: Payer: Medicare PPO | Admitting: Internal Medicine

## 2023-06-17 ENCOUNTER — Ambulatory Visit: Payer: Medicare PPO

## 2023-06-17 DIAGNOSIS — I4891 Unspecified atrial fibrillation: Secondary | ICD-10-CM

## 2023-06-18 DIAGNOSIS — N1832 Chronic kidney disease, stage 3b: Secondary | ICD-10-CM | POA: Diagnosis not present

## 2023-06-18 DIAGNOSIS — E538 Deficiency of other specified B group vitamins: Secondary | ICD-10-CM | POA: Diagnosis not present

## 2023-06-18 DIAGNOSIS — N390 Urinary tract infection, site not specified: Secondary | ICD-10-CM | POA: Diagnosis not present

## 2023-06-18 DIAGNOSIS — M1990 Unspecified osteoarthritis, unspecified site: Secondary | ICD-10-CM | POA: Diagnosis not present

## 2023-06-18 DIAGNOSIS — E039 Hypothyroidism, unspecified: Secondary | ICD-10-CM | POA: Diagnosis not present

## 2023-06-18 DIAGNOSIS — I4891 Unspecified atrial fibrillation: Secondary | ICD-10-CM | POA: Diagnosis not present

## 2023-06-18 LAB — CUP PACEART REMOTE DEVICE CHECK
Date Time Interrogation Session: 20250526233146
Implantable Pulse Generator Implant Date: 20240329

## 2023-06-19 ENCOUNTER — Ambulatory Visit: Payer: Self-pay | Admitting: Internal Medicine

## 2023-06-19 ENCOUNTER — Encounter: Payer: Self-pay | Admitting: Vascular Surgery

## 2023-06-19 ENCOUNTER — Ambulatory Visit: Attending: Vascular Surgery | Admitting: Vascular Surgery

## 2023-06-19 VITALS — BP 118/65 | HR 82 | Temp 98.3°F | Resp 20 | Ht 67.0 in | Wt 112.0 lb

## 2023-06-19 DIAGNOSIS — R600 Localized edema: Secondary | ICD-10-CM

## 2023-06-19 NOTE — Progress Notes (Signed)
 Office Note     CC: Bilateral lower extremity edema Requesting Provider:  Colene Dauphin, MD  HPI: Amy [name] is a 88 y.o. ([birth year]) female who presents at the request of Burns, Beckey Bourgeois, MD for evaluation of bilateral lower extremity edema.  On exam, Amy [name] was doing well, accompanied by her husband.  A native of [place], her and her husband have lived in [place] for most of their lives.  They have 1 daughter, who lives in [place] city.  She was a Diplomatic Services operational officer by trade, and is now retired.  Since March, she has noted significant bilateral lower extremity edema, worse by days end.  This occurred after being placed on amlodipine  due to uncontrolled hypertension.  The swelling has improved with the use of Lasix  medication.  She has attempted compression stockings, but stated it actually made the swelling worse.  She notes heavy, tired, aching feeling by days end.  Denies bleeding, skin break, ulceration.  Denies previous history of venous procedures, denies previous history of DVT   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   Right Malar Cheek   Renal artery stenosis (HCC)     Past Surgical History:  Procedure Laterality Date   A FLUTTER ABLATION     KNEE ARTHROSCOPY Right    ORIF PELVIC FRACTURE WITH PERCUTANEOUS SCREWS N/A 07/19/2022   Procedure: ORIF PELVIC FRACTURE WITH PERCUTANEOUS SCREWS;  Surgeon: Laneta Pintos, MD;  Location: [facility] OR;  Service: Orthopedics;  Laterality: N/A;   PERIPHERAL VASCULAR INTERVENTION Right 02/13/2021   Procedure: PERIPHERAL VASCULAR INTERVENTION;  Surgeon: Margherita Shell, MD;  Location: [facility] INVASIVE CV LAB;  Service: Cardiovascular;  Laterality: Right;   RENAL ANGIOGRAPHY N/A 02/13/2021   Procedure: RENAL  ANGIOGRAPHY;  Surgeon: Margherita Shell, MD;  Location: [facility] INVASIVE CV LAB;  Service: Cardiovascular;  Laterality: N/A;   TUBAL LIGATION      Social History   Socioeconomic History   Marital status: Married    Spouse name: Not on file   Number of children: 1   Years of education: Not on file   Highest education level: Not on file  Occupational History   Occupation: Retired  Tobacco Use   Smoking status: Never    Passive exposure: Never   Smokeless tobacco: Never  Vaping Use   Vaping status: Never Used  Substance and Sexual Activity   Alcohol use: No   Drug use: No   Sexual activity: Not on file  Other Topics Concern   Not on file  Social History Narrative   Occ caffeine    Social Drivers of Health   Financial Resource Strain: Low Risk  (04/14/2023)   Overall Financial Resource Strain (CARDIA)    Difficulty of Paying Living Expenses: Not hard at all  Food Insecurity: No Food Insecurity (04/14/2023)   Hunger Vital Sign    Worried About Running Out of Food in the Last Year: Never true    Ran Out of Food in the Last Year: Never true  Transportation Needs: No Transportation Needs (04/14/2023)   PRAPARE - Administrator, Civil Service (Medical): No    Lack of Transportation (Non-Medical): No  Physical Activity:  Insufficiently Active (04/14/2023)   Exercise Vital Sign    Days of Exercise per Week: 1 day    Minutes of Exercise per Session: 10 min  Stress: No Stress Concern Present (04/14/2023)   [employer] of Occupational Health - Occupational Stress Questionnaire    Feeling of Stress : Not at all  Social Connections: Socially Integrated (04/14/2023)   Social Connection and Isolation Panel [NHANES]    Frequency of Communication with Friends and Family: More than three times a week    Frequency of Social Gatherings with Friends and Family: More than three times a week    Attends Religious Services: More than 4 times per year    Active Member of [employer] or  Organizations: Yes    Attends Engineer, structural: More than 4 times per year    Marital Status: Married  Catering manager Violence: Not At Risk (04/14/2023)   Humiliation, Afraid, Rape, and Kick questionnaire    Fear of Current or Ex-Partner: No    Emotionally Abused: No    Physically Abused: No    Sexually Abused: No   Family History  Problem Relation Age of Onset   Stroke Father 56       died from stroke at age 59   Stroke Mother 9       died from stroke at age 25   Heart disease Mother    Colon cancer Brother     Current Outpatient Medications  Medication Sig Dispense Refill   acetaminophen  (TYLENOL ) 500 MG tablet Take 2 tablets (1,000 mg total) by mouth every 6 (six) hours. 30 tablet 0   amLODipine  (NORVASC ) 2.5 MG tablet Take one tablet (2.5 mg) by mouth daily and may take an extra tablet (2.5 mg) for BP staying greater than 160. 90 tablet 1   atenolol  (TENORMIN ) 25 MG tablet Take 0.5 tablets (12.5 mg total) by mouth daily.     Calcium  Carbonate-Vitamin D  (CALCIUM  + D PO) Take 1 tablet by mouth daily.     dorzolamide (TRUSOPT) 2 % ophthalmic solution 1 drop every morning.     furosemide  (LASIX ) 40 MG tablet Take 1 tablet (40 mg total) by mouth once a week. 30 tablet 0   levothyroxine  (SYNTHROID ) 88 MCG tablet Take 1 tablet (88 mcg total) by mouth daily. 90 tablet 3   lidocaine  (LIDODERM ) 5 % Place 1 patch onto the skin daily. Remove & Discard patch within 12 hours or as directed by MD 30 patch 0   linaclotide  (LINZESS ) 72 MCG capsule Take 1 capsule (72 mcg total) by mouth as needed (for constipation). 30 capsule 0   melatonin 5 MG TABS Take 5 mg by mouth at bedtime.     oxyCODONE  (ROXICODONE ) 5 MG immediate release tablet Take 1 tablet (5 mg total) by mouth daily as needed for severe pain (pain score 7-10) (For chronic back pain). 30 tablet 0   polyethylene glycol (MIRALAX  / GLYCOLAX ) 17 g packet Take 17 g by mouth 2 (two) times daily. 14 each 0   Rivaroxaban   (XARELTO ) 15 MG TABS tablet Take 1 tablet (15 mg total) by mouth daily with supper. (Patient taking differently: Take 15 mg by mouth daily with supper. Pt takes every other day) 42 tablet    traZODone  (DESYREL ) 50 MG tablet Take 0.5-1 tablets (25-50 mg total) by mouth at bedtime. 30 tablet 5   Current Facility-Administered Medications  Medication Dose Route Frequency Provider Last Rate Last Admin   Romosozumab -aqqg (EVENITY ) 105 MG/1.17ML injection  210 mg  210 mg Subcutaneous Once Colene Dauphin, MD       Romosozumab -aqqg (EVENITY ) 105 MG/1. injection 210 mg  210 mg Subcutaneous Once Colene Dauphin, MD        No Known Allergies   REVIEW OF SYSTEMS:   [X]  denotes positive finding, [ ]  denotes negative finding Cardiac  Comments:  Chest pain or chest pressure:    Shortness of breath upon exertion:    Short of breath when lying flat:    Irregular heart rhythm:        Vascular    Pain in calf, thigh, or hip brought on by ambulation:    Pain in feet at night that wakes you up from your sleep:     Blood clot in your veins:    Leg swelling:  X       Pulmonary    Oxygen at home:    Productive cough:     Wheezing:         Neurologic    Sudden weakness in arms or legs:     Sudden numbness in arms or legs:     Sudden onset of difficulty speaking or slurred speech:    Temporary loss of vision in one eye:     Problems with dizziness:         Gastrointestinal    Blood in stool:     Vomited blood:         Genitourinary    Burning when urinating:     Blood in urine:        Psychiatric    Major depression:         Hematologic    Bleeding problems:    Problems with blood clotting too easily:        Skin    Rashes or ulcers:        Constitutional    Fever or chills:      PHYSICAL EXAMINATION:  Vitals:   06/19/23 1041  BP: 118/65  Pulse: 82  Resp: 20  Temp: 98.3 F (36.8 C)  TempSrc: Temporal  SpO2: 100%  Weight: 112 lb (50.8 kg)  Height: 5\' 7"  (1.702 m)     General:  WDWN in NAD; vital signs documented above Gait: Not observed HENT: WNL, normocephalic Pulmonary: normal non-labored breathing , without Rales, rhonchi,  wheezing Cardiac: regular HR Abdomen: soft, NT, no masses Skin: without rashes Vascular Exam/Pulses:  Right Left  Radial 2+ (normal) 2+ (normal)  Ulnar    Femoral    Popliteal    DP 2+ (normal)   PT  2+ (normal)   Extremities: without ischemic changes, without Gangrene , without cellulitis; without open wounds;  Musculoskeletal: no muscle wasting or atrophy  Neurologic: A&O X 3;  No focal weakness or paresthesias are detected Psychiatric:  The pt has Normal affect.   Non-Invasive Vascular Imaging:     Venous Reflux Times  +--------------+---------+------+-----------+------------+--------+  RIGHT        Reflux NoRefluxReflux TimeDiameter cmsComments                          Yes                                   +--------------+---------+------+-----------+------------+--------+  CFV          no                                              +--------------+---------+------+-----------+------------+--------+  FV mid        no                                              +--------------+---------+------+-----------+------------+--------+  Popliteal    no                                              +--------------+---------+------+-----------+------------+--------+  GSV at [facility]    no                            0.52              +--------------+---------+------+-----------+------------+--------+  GSV prox thighno                            0.49              +--------------+---------+------+-----------+------------+--------+  GSV mid thigh no                            0.42              +--------------+---------+------+-----------+------------+--------+  GSV dist thighno                            0.42               +--------------+---------+------+-----------+------------+--------+  GSV at knee   no                            0.37              +--------------+---------+------+-----------+------------+--------+  GSV prox calf no                            0.22              +--------------+---------+------+-----------+------------+--------+  SSV Pop Fossa no                            0.24              +--------------+---------+------+-----------+------------+--------+  SSV prox calf no                            0.14              +--------------+---------+------+-----------+------------+--------+     ASSESSMENT/PLAN:: 88 y.o. female presenting with bilateral lower extremity edema worse by days end.  On physical exam, there was minimal edema today. She had palpable pulses in the feet. Reflux ultrasound study was reviewed demonstrating no reflux in the right lower extremity.  I had a long conversation with Amy [name] and her husband regarding the lower extremity edema.  I think it is directly related to starting amlodipine  and March.  We discussed that there are several different medication alternatives that could be tried with different side effect profiles.  I think she would be best served with discontinuation of amlodipine , trial of another antihypertensive medication.  With CKD 3B, she may need to supplement Lasix  intermittently, however that will be a decision for Dr. Donnette Gal and Dr. Cindra Cree.    Kayla Part, MD [facility] [phone] Total time of patient care including pre-visit research, consultation, and documentation greater than 45 minutes

## 2023-06-22 ENCOUNTER — Encounter: Payer: Self-pay | Admitting: Internal Medicine

## 2023-06-22 DIAGNOSIS — I872 Venous insufficiency (chronic) (peripheral): Secondary | ICD-10-CM

## 2023-06-22 DIAGNOSIS — I4891 Unspecified atrial fibrillation: Secondary | ICD-10-CM

## 2023-06-22 DIAGNOSIS — D649 Anemia, unspecified: Secondary | ICD-10-CM

## 2023-06-22 DIAGNOSIS — Z79899 Other long term (current) drug therapy: Secondary | ICD-10-CM

## 2023-06-22 DIAGNOSIS — I10 Essential (primary) hypertension: Secondary | ICD-10-CM

## 2023-06-22 DIAGNOSIS — I480 Paroxysmal atrial fibrillation: Secondary | ICD-10-CM

## 2023-06-22 DIAGNOSIS — R600 Localized edema: Secondary | ICD-10-CM

## 2023-06-25 NOTE — Telephone Encounter (Signed)
 Go ahead and stop amlodipine    Follow BP   may need to try another medicine for BP if edema improves Note by Dr Christia Cowboy she did not have edema at that visit

## 2023-06-26 NOTE — Telephone Encounter (Signed)
 For now, yes   Take amlodipine   Ms Boster should com in to recheck TSH and CMET, BNP and CBC

## 2023-06-26 NOTE — Addendum Note (Signed)
 Addended by: Alanna Alley on: 06/26/2023 04:39 PM   Modules accepted: Orders

## 2023-07-02 NOTE — Addendum Note (Signed)
 Addended by: Edra Govern D on: 07/02/2023 10:47 AM   Modules accepted: Orders

## 2023-07-02 NOTE — Progress Notes (Signed)
 Carelink Summary Report / Loop Recorder

## 2023-07-09 ENCOUNTER — Other Ambulatory Visit: Payer: Self-pay | Admitting: Adult Health

## 2023-07-10 DIAGNOSIS — E039 Hypothyroidism, unspecified: Secondary | ICD-10-CM | POA: Diagnosis not present

## 2023-07-10 DIAGNOSIS — H26492 Other secondary cataract, left eye: Secondary | ICD-10-CM | POA: Diagnosis not present

## 2023-07-10 DIAGNOSIS — Z79899 Other long term (current) drug therapy: Secondary | ICD-10-CM | POA: Diagnosis not present

## 2023-07-10 DIAGNOSIS — M7989 Other specified soft tissue disorders: Secondary | ICD-10-CM | POA: Diagnosis not present

## 2023-07-10 DIAGNOSIS — I10 Essential (primary) hypertension: Secondary | ICD-10-CM | POA: Diagnosis not present

## 2023-07-10 DIAGNOSIS — I4891 Unspecified atrial fibrillation: Secondary | ICD-10-CM | POA: Diagnosis not present

## 2023-07-10 DIAGNOSIS — H524 Presbyopia: Secondary | ICD-10-CM | POA: Diagnosis not present

## 2023-07-10 DIAGNOSIS — H40012 Open angle with borderline findings, low risk, left eye: Secondary | ICD-10-CM | POA: Diagnosis not present

## 2023-07-10 DIAGNOSIS — H35371 Puckering of macula, right eye: Secondary | ICD-10-CM | POA: Diagnosis not present

## 2023-07-10 DIAGNOSIS — H401111 Primary open-angle glaucoma, right eye, mild stage: Secondary | ICD-10-CM | POA: Diagnosis not present

## 2023-07-10 DIAGNOSIS — D649 Anemia, unspecified: Secondary | ICD-10-CM | POA: Diagnosis not present

## 2023-07-10 DIAGNOSIS — R600 Localized edema: Secondary | ICD-10-CM | POA: Diagnosis not present

## 2023-07-11 DIAGNOSIS — N1832 Chronic kidney disease, stage 3b: Secondary | ICD-10-CM | POA: Diagnosis not present

## 2023-07-11 DIAGNOSIS — N390 Urinary tract infection, site not specified: Secondary | ICD-10-CM | POA: Diagnosis not present

## 2023-07-11 LAB — COMPREHENSIVE METABOLIC PANEL WITH GFR
ALT: 16 IU/L (ref 0–32)
AST: 20 IU/L (ref 0–40)
Albumin: 4.5 g/dL (ref 3.7–4.7)
Alkaline Phosphatase: 152 IU/L — ABNORMAL HIGH (ref 44–121)
BUN/Creatinine Ratio: 17 (ref 12–28)
BUN: 24 mg/dL (ref 8–27)
Bilirubin Total: 0.3 mg/dL (ref 0.0–1.2)
CO2: 23 mmol/L (ref 20–29)
Calcium: 9.4 mg/dL (ref 8.7–10.3)
Chloride: 105 mmol/L (ref 96–106)
Creatinine, Ser: 1.38 mg/dL — ABNORMAL HIGH (ref 0.57–1.00)
Globulin, Total: 2.6 g/dL (ref 1.5–4.5)
Glucose: 98 mg/dL (ref 70–99)
Potassium: 4.5 mmol/L (ref 3.5–5.2)
Sodium: 140 mmol/L (ref 134–144)
Total Protein: 7.1 g/dL (ref 6.0–8.5)
eGFR: 38 mL/min/{1.73_m2} — ABNORMAL LOW (ref >59)

## 2023-07-11 LAB — TSH: TSH: 3.89 u[IU]/mL (ref 0.450–4.500)

## 2023-07-11 LAB — PRO B NATRIURETIC PEPTIDE: NT-Pro BNP: 1034 pg/mL — ABNORMAL HIGH (ref 0–738)

## 2023-07-14 ENCOUNTER — Ambulatory Visit: Payer: Self-pay | Admitting: Internal Medicine

## 2023-07-14 ENCOUNTER — Other Ambulatory Visit: Payer: Self-pay | Admitting: Adult Health

## 2023-07-15 ENCOUNTER — Other Ambulatory Visit: Payer: Self-pay

## 2023-07-15 ENCOUNTER — Encounter: Payer: Self-pay | Admitting: Internal Medicine

## 2023-07-15 NOTE — Telephone Encounter (Signed)
 Refill Xarelto

## 2023-07-16 ENCOUNTER — Telehealth: Payer: Self-pay | Admitting: Internal Medicine

## 2023-07-16 NOTE — Telephone Encounter (Signed)
 Copied from CRM [id number]. Topic: Clinical - Medication Refill >> Jul 16, 2023  3:38 PM Viola F wrote: Medication: Rivaroxaban  (XARELTO ) 15 MG TABS tablet [[id number]]  Has the patient contacted their pharmacy? Yes (Agent: If no, request that the patient contact the pharmacy for the refill. If patient does not wish to contact the pharmacy document the reason why and proceed with request.) (Agent: If yes, when and what did the pharmacy advise?)  This is the patient's preferred pharmacy:  [id number] - [place], [place] - [address] [place] [address] Phone: [phone] Fax: [phone]   Is this the correct pharmacy for this prescription? Yes If no, delete pharmacy and type the correct one.   Has the prescription been filled recently? Yes  Is the patient out of the medication? Yes, patient has been out for 6 days   Has the patient been seen for an appointment in the last year OR does the patient have an upcoming appointment? Yes  Can we respond through MyChart? Yes  Agent: Please be advised that Rx refills may take up to 3 business days. We ask that you follow-up with your pharmacy.

## 2023-07-17 ENCOUNTER — Ambulatory Visit

## 2023-07-17 ENCOUNTER — Other Ambulatory Visit: Payer: Self-pay

## 2023-07-17 DIAGNOSIS — R55 Syncope and collapse: Secondary | ICD-10-CM

## 2023-07-17 LAB — CUP PACEART REMOTE DEVICE CHECK
Date Time Interrogation Session: 20250625234314
Implantable Pulse Generator Implant Date: 20240329

## 2023-07-17 MED ORDER — RIVAROXABAN 15 MG PO TABS: 15.0000 mg | ORAL_TABLET | Freq: Every day | ORAL | 1 refills | Status: DC

## 2023-07-18 ENCOUNTER — Telehealth: Payer: Self-pay

## 2023-07-18 ENCOUNTER — Other Ambulatory Visit: Payer: Self-pay | Admitting: Internal Medicine

## 2023-07-18 MED ORDER — RIVAROXABAN 15 MG PO TABS: 15.0000 mg | ORAL_TABLET | Freq: Every day | ORAL | 1 refills | Status: DC

## 2023-07-18 NOTE — Progress Notes (Signed)
 Refill sent to preferred pharmacy.

## 2023-07-18 NOTE — Telephone Encounter (Signed)
 Spoke with patient today.  She was calling Xarelto  which is managed by Cardiology and not us .  Let her know that Dr. Okey refilled medication for her today.

## 2023-07-18 NOTE — Addendum Note (Signed)
 Addended by: MELIDA ROLIN HERO on: 07/18/2023 01:42 PM   Modules accepted: Orders

## 2023-07-18 NOTE — Telephone Encounter (Signed)
 Copied from CRM [id number]. Topic: Clinical - Medication Question >> Jul 18, 2023  2:18 PM Viola F wrote: Reason for CRM: Patient would like a call back today regarding her [phone] (M) - the weekend is coming and she is completely out. She says the pharmacy has been trying to get it refilled all week. >> Jul 18, 2023  2:23 PM CMA Lila C wrote: Wrong office

## 2023-07-20 ENCOUNTER — Ambulatory Visit: Payer: Self-pay | Admitting: Internal Medicine

## 2023-07-21 ENCOUNTER — Telehealth: Payer: Self-pay | Admitting: Internal Medicine

## 2023-07-21 NOTE — Telephone Encounter (Signed)
*  STAT* If patient is at the pharmacy, call can be transferred to refill team.   1. Which medications need to be refilled? (please list name of each medication and dose if known)   Rivaroxaban  (XARELTO ) 15 MG TABS tablet  furosemide  (LASIX ) 40 MG tablet   2. Would you like to learn more about the convenience, safety, & potential cost savings by using the [facility]?   3. Are you open to using the [facility] (Type [facility]. ).  4. Which pharmacy/location (including street and city if local pharmacy) is medication to be sent to?  [id number] - [place], [place] - [address]   5. Do they need a 30 day or 90 day supply?   30 day  Patient stated she is completely out of these medications.

## 2023-07-29 ENCOUNTER — Telehealth: Payer: Self-pay | Admitting: Internal Medicine

## 2023-07-29 MED ORDER — RIVAROXABAN 15 MG PO TABS: 15.0000 mg | ORAL_TABLET | Freq: Every day | ORAL | 1 refills | Status: DC

## 2023-07-29 NOTE — Telephone Encounter (Signed)
 Prescription refill request for Xarelto  received.  Indication: PAF Last office visit: 04/21/23  Amy Gull MD Weight: 48.3kg Age: 88 Scr: 1.38 on 07/10/23  Epic CrCl: 22.31  Based on above findings Xarelto  15mg  daily is the appropriate dose.  Refill approved.

## 2023-07-29 NOTE — Telephone Encounter (Signed)
*  STAT* If patient is at the pharmacy, call can be transferred to refill team.   1. Which medications need to be refilled? (please list name of each medication and dose if known)   Rivaroxaban  (XARELTO ) 15 MG TABS tablet    2. Which pharmacy/location (including street and city if local pharmacy) is medication to be sent to?  [id number] - [place], [place] - [address]      3. Do they need a 30 day or 90 day supply? 90 day    Pt has been out of medication for 3 weeks now

## 2023-07-31 ENCOUNTER — Other Ambulatory Visit: Payer: Self-pay | Admitting: Adult Health

## 2023-07-31 ENCOUNTER — Encounter: Payer: Self-pay | Admitting: Internal Medicine

## 2023-07-31 DIAGNOSIS — I4891 Unspecified atrial fibrillation: Secondary | ICD-10-CM

## 2023-08-01 ENCOUNTER — Ambulatory Visit (INDEPENDENT_AMBULATORY_CARE_PROVIDER_SITE_OTHER)

## 2023-08-01 DIAGNOSIS — M810 Age-related osteoporosis without current pathological fracture: Secondary | ICD-10-CM | POA: Diagnosis not present

## 2023-08-01 MED ORDER — RIVAROXABAN 15 MG PO TABS: 15.0000 mg | ORAL_TABLET | Freq: Every day | ORAL | 1 refills | Status: AC

## 2023-08-01 MED ORDER — ROMOSOZUMAB-AQQG 105 MG/1.17ML ~~LOC~~ SOSY
210.0000 mg | PREFILLED_SYRINGE | Freq: Once | SUBCUTANEOUS | Status: AC
  Administered 2023-09-01 (×2): 210 mg via SUBCUTANEOUS

## 2023-08-01 MED ORDER — ROMOSOZUMAB-AQQG 105 MG/1.17ML ~~LOC~~ SOSY
210.0000 mg | PREFILLED_SYRINGE | Freq: Once | SUBCUTANEOUS | Status: AC
  Administered 2023-08-01: 210 mg via SUBCUTANEOUS

## 2023-08-01 NOTE — Progress Notes (Signed)
 Patient here for monthly Evenity   injection per Dr. Geofm. Both Evenity  shots given in right arm subcutaneous and patient tolerated injections well today.

## 2023-08-01 NOTE — Addendum Note (Signed)
 Addended by: CLAUDENE TOBIAS PARAS on: 08/01/2023 04:07 PM   Modules accepted: Orders

## 2023-08-01 NOTE — Telephone Encounter (Signed)
 Prescription refill request for Xarelto  received.  Indication: Afib  Last office visit: 04/21/23 Sheri)  Weight: 50.8kg Age: 88 Scr: 1.38 (07/10/23)  CrCl: 23.48ml/min  Appropriate dose. Refill sent.

## 2023-08-04 ENCOUNTER — Telehealth: Payer: Self-pay

## 2023-08-04 NOTE — Telephone Encounter (Signed)
 Evenity  VOB initiated via [email or web address]  Last Evenity  inj: 08/01/23 Next Evenity  inj DUE: 09/01/23

## 2023-08-05 ENCOUNTER — Other Ambulatory Visit (HOSPITAL_COMMUNITY): Payer: Self-pay

## 2023-08-05 DIAGNOSIS — M170 Bilateral primary osteoarthritis of knee: Secondary | ICD-10-CM | POA: Diagnosis not present

## 2023-08-05 DIAGNOSIS — M25562 Pain in left knee: Secondary | ICD-10-CM | POA: Diagnosis not present

## 2023-08-05 DIAGNOSIS — M25561 Pain in right knee: Secondary | ICD-10-CM | POA: Diagnosis not present

## 2023-08-05 DIAGNOSIS — R262 Difficulty in walking, not elsewhere classified: Secondary | ICD-10-CM | POA: Diagnosis not present

## 2023-08-05 NOTE — Telephone Encounter (Signed)
 MEDICAL PA

## 2023-08-05 NOTE — Telephone Encounter (Signed)
 [name]

## 2023-08-05 NOTE — Telephone Encounter (Signed)
 PA submitted via CMM for Pharmacy benefit. Key: [id number]   PA denied.

## 2023-08-06 NOTE — Telephone Encounter (Signed)
 Pt ready for scheduling for EVENITY  on or after : 09/01/23  Option# 1 Buy/Bill (Office supplied medication)  Out-of-pocket cost due at time of  office visit: $507.41  Number of injection/visits approved: 12  Primary: [employer] Evenity  co-insurance: 20% Admin fee co-insurance: 20%  Secondary: --- Evenity  co-insurance:  Admin fee co-insurance:   Medical Benefit Details: Date Benefits were checked: 08/05/23 Deductible: NO/ Coinsurance: 20%/ Admin Fee: 20%  Prior Auth: APPROVED PA# [id number] Expiration Date: 03/27/23-01/21/24   # of doses approved: 12 ------------------------------------------------------------------------- Option# 2- Med Obtained from pharmacy  Pharmacy benefit: Copay $--- (Paid to pharmacy) Admin Fee: --- (Pay at clinic)  Prior Auth: DENIED PA# Expiration Date:   # of doses approved:  If patient wants fill through the pharmacy benefit please send prescription to: ---, and include estimated need by date in rx notes. Pharmacy will ship medication directly to the office.  Patient NOT eligible for Evenity  Copay Card. Copay Card can make patient's cost as little as $25. Link to apply: [email or web address]   This summary of benefits is an estimation of the patient's out-of-pocket cost. Exact cost may very based on individual plan coverage.

## 2023-08-07 NOTE — Progress Notes (Signed)
 Carelink Summary Report / Loop Recorder

## 2023-08-07 NOTE — Addendum Note (Signed)
 Addended by: TAWNI DRILLING D on: 08/07/2023 04:00 PM   Modules accepted: Orders

## 2023-08-18 ENCOUNTER — Ambulatory Visit (INDEPENDENT_AMBULATORY_CARE_PROVIDER_SITE_OTHER)

## 2023-08-18 DIAGNOSIS — I4891 Unspecified atrial fibrillation: Secondary | ICD-10-CM | POA: Diagnosis not present

## 2023-08-19 LAB — CUP PACEART REMOTE DEVICE CHECK
Date Time Interrogation Session: 20250727234141
Implantable Pulse Generator Implant Date: 20240329

## 2023-08-22 ENCOUNTER — Ambulatory Visit: Payer: Self-pay | Admitting: Internal Medicine

## 2023-08-27 NOTE — Progress Notes (Unsigned)
 Cardiology Office Note   Date:  08/28/2023   ID:  Amy [name], DOB 02-03-1935, MRN [id number]  PCP:  Geofm Glade PARAS, MD  Cardiologist:   Vina Gull, MD    Pt here  for follow up of atrial fib, HTN and syncope  History of Present Illness: Amy [name] is a 88 y.o. female with a history of atrial flutter (s/p ablation 2008), atrial fibrillation, syncope, HTN, RAS (s/p PTA/stent to R renal artery Jan 2023) and hypothyroidism. Oct 2023   ER visit for severe HTN  USN of renal arteries showed >60% R renal artery stenosis. -Jan 2024  SBP 120-150   Amldodipine decreased to 5 mg ; Pt switched from losartan  to olmesartan  40mg   -Feb 14, 2022:  ER visit for severe BP elevation.  Given clonidine  and IV fluids    In SR at time  Walking out of ER, pt  had a syncopal spell   No prodrome.   Suffered maxillary, orbital and L olecranon fracture    Admitted for BP control     -Feb 2024  Seen by ONEIDA Fabry   BP controlled at that time.  Carotid USN mild CVdz on R carotid  Zio patch showed afib 91% time  Average HR 77 bpm -March 2024  Seen by KANDICE Birmingham   ILR placed given syncope  -May 2024   [facility]   Suffered pelvic fracture    Worsened with ramus and other fractures    Underwnt surgical repair. - Pt seen BP 158/80   Interrogation of ILR showed very high rates   Based on this pt started on amiodarone  200 mg bid Sept 2024  Pt with very labile BP readings.  110s to 180s   Mostly in 140s    Couple days  Dec 2024  Pt stopped amiodarone   Jan 2025  Amlodipine  called in for elevated HTN Feb 2025   Pt complained of LE edema     TSH 24.5 at time     March 2025  Pt seen in clinic   Had LE and facial swelling    Very weak  TSH still abnormal at time  18  The pt says she is currently is  feeling good  NO LE edema.  No palpitations   No CP  No dizziness  Breathing is OK     Taking Xarelto  every other day.     Outpatient Medications Prior to Visit  Medication Sig Dispense Refill   amLODipine  (NORVASC ) 2.5 MG tablet  Take 2.5 mg by mouth 2 (two) times daily.     acetaminophen  (TYLENOL ) 500 MG tablet Take 2 tablets (1,000 mg total) by mouth every 6 (six) hours. 30 tablet 0   atenolol  (TENORMIN ) 25 MG tablet Take 0.5 tablets (12.5 mg total) by mouth daily.     Calcium  Carbonate-Vitamin D  (CALCIUM  + D PO) Take 1 tablet by mouth daily.     dorzolamide (TRUSOPT) 2 % ophthalmic solution 1 drop every morning.     furosemide  (LASIX ) 40 MG tablet Take 1 tablet (40 mg total) by mouth once a week. (Patient not taking: Reported on 08/28/2023) 30 tablet 0   levothyroxine  (SYNTHROID ) 88 MCG tablet Take 1 tablet (88 mcg total) by mouth daily. 90 tablet 3   lidocaine  (LIDODERM ) 5 % Place 1 patch onto the skin daily. Remove & Discard patch within 12 hours or as directed by MD (Patient not taking: Reported on 08/28/2023) 30 patch 0   linaclotide  (LINZESS ) 72 MCG  capsule Take 1 capsule (72 mcg total) by mouth as needed (for constipation). (Patient not taking: Reported on 08/28/2023) 30 capsule 0   melatonin 5 MG TABS Take 5 mg by mouth at bedtime.     oxyCODONE  (ROXICODONE ) 5 MG immediate release tablet Take 1 tablet (5 mg total) by mouth daily as needed for severe pain (pain score 7-10) (For chronic back pain). 30 tablet 0   polyethylene glycol (MIRALAX  / GLYCOLAX ) 17 g packet Take 17 g by mouth 2 (two) times daily. (Patient not taking: Reported on 08/28/2023) 14 each 0   Rivaroxaban  (XARELTO ) 15 MG TABS tablet Take 1 tablet (15 mg total) by mouth daily with supper. 90 tablet 1   traZODone  (DESYREL ) 50 MG tablet Take 0.5-1 tablets (25-50 mg total) by mouth at bedtime. (Patient not taking: Reported on 08/28/2023) 30 tablet 5   Facility-Administered Medications Prior to Visit  Medication Dose Route Frequency Provider Last Rate Last Admin   Romosozumab -aqqg (EVENITY ) 105 MG/1. injection 210 mg  210 mg Subcutaneous Once Geofm Glade PARAS, MD       Romosozumab -aqqg (EVENITY ) 105 MG/1. injection 210 mg  210 mg Subcutaneous Once Geofm Glade PARAS, MD       [START ON 09/01/2023] Romosozumab -aqqg (EVENITY ) 105 MG/1. injection 210 mg  210 mg Subcutaneous Once Geofm Glade PARAS, MD         Allergies:   Patient has no known allergies.   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   Right Malar Cheek   Renal artery stenosis (HCC)     Past Surgical History:  Procedure Laterality Date   A FLUTTER ABLATION     KNEE ARTHROSCOPY Right    ORIF PELVIC FRACTURE WITH PERCUTANEOUS SCREWS N/A 07/19/2022   Procedure: ORIF PELVIC FRACTURE WITH PERCUTANEOUS SCREWS;  Surgeon: Kendal Franky SQUIBB, MD;  Location: [facility] OR;  Service: Orthopedics;  Laterality: N/A;   PERIPHERAL VASCULAR INTERVENTION Right 02/13/2021   Procedure: PERIPHERAL VASCULAR INTERVENTION;  Surgeon: Serene Gaile ORN, MD;  Location: [facility] INVASIVE CV LAB;  Service: Cardiovascular;  Laterality: Right;   RENAL ANGIOGRAPHY N/A 02/13/2021   Procedure: RENAL ANGIOGRAPHY;  Surgeon: Serene Gaile ORN, MD;  Location: [facility]VAS[facility] CV LAB;  Service: Cardiovascular;  Laterality: N/A;   TUBAL LIGATION       Social History:  The patient  reports that she has never smoked. She has never been exposed to tobacco smoke. She has never used smokeless tobacco. She reports that she does not drink alcohol and does not use drugs.   Family History:  The patient's family history includes Colon cancer in her brother; Heart disease in her mother; Stroke (age of onset: 80) in her mother; Stroke (age of onset: 24) in her father.    ROS:  Please see the history of present illness. All other systems are reviewed and  Negative to the above problem except as noted.    PHYSICAL EXAM: VS:  BP 124/80   Pulse 93   Ht 5' 7 (1.702 m)   Wt 117 lb 6.4 oz (53.3 kg)   LMP  (LMP  Unknown)   SpO2 99%   BMI 18.39 kg/m  GEN: Thin 88 yo who is in NAD   Neck: JVP is not elevated     Cardiac:Irreg irreg ; no murmurs Respiratory:  clear to auscultation bilaterally  Extremities:No LE edema     EKG:  EKG not done  today   Event monitor   Jan 2024  Predominant rhythm:  Atrial fibrillation 91% of time   Rates 47 to 133 bpm   Average HR 77 bpm   Rare PVC  Carotid USN  Jan 2024  Right Carotid: Velocities in the right ICA are consistent with a 1-39%  stenosis.  Left Carotid: There is no evidence of stenosis in the left ICA. The  extracranial vessels were near-normal with only minimal wall thickening or plaque.  Vertebrals:  Bilateral vertebral arteries demonstrate antegrade flow.  Subclavians: Normal flow hemodynamics were seen in bilateral subclavian arteries.    Echo   02/15/22 1. Left ventricular ejection fraction, by estimation, is 65 to 70%. The  left ventricle has normal function. The left ventricle has no regional  wall motion abnormalities. Left ventricular diastolic parameters are  consistent with Grade II diastolic  dysfunction (pseudonormalization).   2. Right ventricular systolic function is normal. The right ventricular  size is normal. There is moderately elevated pulmonary artery systolic  pressure. The estimated right ventricular systolic pressure is 54.0 mmHg.   3. Left atrial size was moderately dilated.   4. A small pericardial effusion is present. The pericardial effusion is  posterior to the left ventricle.   5. The mitral valve is degenerative. Mild mitral valve regurgitation.   6. Tricuspid valve regurgitation is mild to moderate.   7. The aortic valve is tricuspid. There is mild calcification of the  aortic valve. Aortic valve regurgitation is trivial. Aortic valve  sclerosis/calcification is present, without any evidence of aortic  stenosis.   8. The inferior vena cava is normal in size with greater than 50%  respiratory  variability, suggesting right atrial pressure of 3 mmHg.   Renal Artery US  11/20/21 Summary:  Renal:    Right: Evidence of a > 60% stenosis of the right renal artery. RRV         flow present. Abnormal size for the right kidney. Abnormal         right Resistive Index. Normal cortical thickness of right         kidney.  Left:  No evidence of left renal artery stenosis. LRV flow present.         Abnormal size for the left kidney. Abnormal left Resisitve         Index. Normal cortical thickness of the left kidney.  Mesenteric:  Normal Celiac artery findings. 70 to 99% stenosis in the superior  mesenteric  artery. Areas of limited visceral study include right kidney size, left  kidney  size and right parenchymal flow.    Summary: Renal: Right: Abnormal right Resistive Index. Normal cortical thickness of right kidney. Probable occlusion of the right renal artery. Left: Abnormal left Resisitve Index. Normal cortical thickness of the left kidney. Normal size of left kidney. No evidence of left renal artery stenosis. LRV flow present. Mesenteric: Normal Celiac artery findings. 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   Lipid Panel    Component Value Date/Time   CHOL 196 05/01/2022 1450   CHOL 219 (H) 10/12/2020 1617   TRIG 96.0 05/01/2022 1450   HDL 93.50 05/01/2022 1450   HDL 95 10/12/2020 1617   CHOLHDL 2 05/01/2022 1450  VLDL 19.2 05/01/2022 1450   LDLCALC 83 05/01/2022 1450   LDLCALC 109 (H) 10/12/2020 1617   LDLDIRECT 84.9 11/15/2008 0924      Wt Readings from Last 3 Encounters:  08/28/23 117 lb 6.4 oz (53.3 kg)  06/19/23 112 lb (50.8 kg)  05/02/23 106 lb (48.1 kg)      ASSESSMENT AND PLAN:  1Edema   This has resolved with thyroid  replacement    Continue  2  Atiral fibrillation   Pt currently with rate control   Stopped amiodarone    (note TSH was normal before stopping) Continue rate control The pt is taking  Xarelto  every other day (15 mg)   I  spoke to her again about increased stroke risk   She is not on therapeutic dosing       3 HTN  BP is currently well controlled  Follow at home     4  Syncope  Syncopal spell at [facility] ER   Pt now with ILF  5   Lipids   LDL 83 in April 2024.  6  Hypothyroidism.  Last TSH was normal   Pt much improved   Energy good   No edema on exam  Plans for follow-up later this summer  Current medicines are reviewed at length with the patient today.  The patient does not have concerns regarding medicines.  Signed, Vina Gull, MD  08/28/2023 1:18 PM    [facility] [address] [place], [place], [place]  [address] Phone: [phone]; Fax: [phone]

## 2023-08-28 ENCOUNTER — Ambulatory Visit: Attending: Internal Medicine | Admitting: Internal Medicine

## 2023-08-28 ENCOUNTER — Encounter: Payer: Self-pay | Admitting: Internal Medicine

## 2023-08-28 VITALS — BP 124/80 | HR 93 | Ht 67.0 in | Wt 117.4 lb

## 2023-08-28 DIAGNOSIS — I10 Essential (primary) hypertension: Secondary | ICD-10-CM

## 2023-08-28 NOTE — Patient Instructions (Signed)
 Medication Instructions:  Your physician recommends that you continue on your current medications as directed. Please refer to the Current Medication list given to you today.  *If you need a refill on your cardiac medications before your next appointment, please call your pharmacy*  Follow-Up: At [facility], you and your health needs are our priority.  As part of our continuing mission to provide you with exceptional heart care, our providers are all part of one team.  This team includes your primary Cardiologist (physician) and Advanced Practice Providers or APPs (Physician Assistants and Nurse Practitioners) who all work together to provide you with the care you need, when you need it.  Your next appointment:   6 month(s)  Provider:   Vina Gull, MD   We recommend signing up for the patient portal called MyChart.  Sign up information is provided on this After Visit Summary.  MyChart is used to connect with patients for Virtual Visits (Telemedicine).  Patients are able to view lab/test results, encounter notes, upcoming appointments, etc.  Non-urgent messages can be sent to your provider as well.    To learn more about what you can do with MyChart, go to [email or web address].

## 2023-09-01 ENCOUNTER — Ambulatory Visit

## 2023-09-01 DIAGNOSIS — M810 Age-related osteoporosis without current pathological fracture: Secondary | ICD-10-CM

## 2023-09-01 NOTE — Progress Notes (Signed)
 After obtaining consent, and per orders of Dr. Lawerance Bach, injection of Evenity given by Ferdie Ping. Patient instructed to report any adverse reaction to me immediately.

## 2023-09-12 ENCOUNTER — Encounter: Payer: Self-pay | Admitting: Radiology

## 2023-09-18 ENCOUNTER — Ambulatory Visit

## 2023-09-18 DIAGNOSIS — I4891 Unspecified atrial fibrillation: Secondary | ICD-10-CM

## 2023-09-18 LAB — CUP PACEART REMOTE DEVICE CHECK
Date Time Interrogation Session: 20250827233520
Implantable Pulse Generator Implant Date: 20240329

## 2023-09-22 ENCOUNTER — Ambulatory Visit: Payer: Self-pay | Admitting: Internal Medicine

## 2023-09-23 DIAGNOSIS — N1832 Chronic kidney disease, stage 3b: Secondary | ICD-10-CM | POA: Diagnosis not present

## 2023-09-24 ENCOUNTER — Telehealth: Payer: Self-pay | Admitting: Radiology

## 2023-09-24 DIAGNOSIS — N1832 Chronic kidney disease, stage 3b: Secondary | ICD-10-CM | POA: Diagnosis not present

## 2023-09-24 DIAGNOSIS — I739 Peripheral vascular disease, unspecified: Secondary | ICD-10-CM | POA: Diagnosis not present

## 2023-09-24 DIAGNOSIS — I701 Atherosclerosis of renal artery: Secondary | ICD-10-CM | POA: Diagnosis not present

## 2023-09-24 DIAGNOSIS — I4892 Unspecified atrial flutter: Secondary | ICD-10-CM | POA: Diagnosis not present

## 2023-09-24 DIAGNOSIS — I129 Hypertensive chronic kidney disease with stage 1 through stage 4 chronic kidney disease, or unspecified chronic kidney disease: Secondary | ICD-10-CM | POA: Diagnosis not present

## 2023-09-24 NOTE — Telephone Encounter (Signed)
 Copied from CRM [id number]. Topic: Clinical - Medical Advice >> Sep 24, 2023 11:30 AM Amy [name] wrote: Reason for CRM: Patient would like to know when she should come in again to get her Evenity  injection. Patient would also like a call to schedule.

## 2023-09-25 NOTE — Telephone Encounter (Signed)
 Last injection was done on 09/01/23.  She is due for next injection on 9/11.  If she calls back please schedule her on the nurse visit.

## 2023-09-25 NOTE — Telephone Encounter (Signed)
 Message left for her today to contact office to schedule.

## 2023-10-02 ENCOUNTER — Ambulatory Visit

## 2023-10-02 ENCOUNTER — Telehealth: Payer: Self-pay

## 2023-10-02 DIAGNOSIS — M810 Age-related osteoporosis without current pathological fracture: Secondary | ICD-10-CM

## 2023-10-02 MED ORDER — ROMOSOZUMAB-AQQG 105 MG/1.17ML ~~LOC~~ SOSY
210.0000 mg | PREFILLED_SYRINGE | SUBCUTANEOUS | Status: AC
  Administered 2023-10-02: 210 mg via SUBCUTANEOUS

## 2023-10-02 NOTE — Progress Notes (Signed)
 Remote Loop Recorder Transmission

## 2023-10-02 NOTE — Telephone Encounter (Signed)
 Patient in today for her Evenity , and insurance told her we were running her medical benefits not her pharmacy benefits.  Patient provided information provided to us  as we only have access to her medical not pharmacy benefits. Informed patient that we are unable to access or get estimates of that information. She stated she would call and let us  know what they said, and order medication in the future.

## 2023-10-02 NOTE — Progress Notes (Addendum)
 There were no question or concerns pt responded well to injection.   Please sign as MD is out office

## 2023-10-03 NOTE — Progress Notes (Signed)
 Carelink Summary Report / Loop Recorder

## 2023-10-15 DIAGNOSIS — M25562 Pain in left knee: Secondary | ICD-10-CM | POA: Diagnosis not present

## 2023-10-15 DIAGNOSIS — M25561 Pain in right knee: Secondary | ICD-10-CM | POA: Diagnosis not present

## 2023-10-15 DIAGNOSIS — R262 Difficulty in walking, not elsewhere classified: Secondary | ICD-10-CM | POA: Diagnosis not present

## 2023-10-15 DIAGNOSIS — M170 Bilateral primary osteoarthritis of knee: Secondary | ICD-10-CM | POA: Diagnosis not present

## 2023-10-20 ENCOUNTER — Ambulatory Visit (INDEPENDENT_AMBULATORY_CARE_PROVIDER_SITE_OTHER)

## 2023-10-20 DIAGNOSIS — I4891 Unspecified atrial fibrillation: Secondary | ICD-10-CM | POA: Diagnosis not present

## 2023-10-20 LAB — CUP PACEART REMOTE DEVICE CHECK
Date Time Interrogation Session: 20250928233918
Implantable Pulse Generator Implant Date: 20240329

## 2023-10-21 ENCOUNTER — Ambulatory Visit: Payer: Self-pay | Admitting: Internal Medicine

## 2023-10-22 DIAGNOSIS — R262 Difficulty in walking, not elsewhere classified: Secondary | ICD-10-CM | POA: Diagnosis not present

## 2023-10-22 DIAGNOSIS — M170 Bilateral primary osteoarthritis of knee: Secondary | ICD-10-CM | POA: Diagnosis not present

## 2023-10-22 DIAGNOSIS — M25562 Pain in left knee: Secondary | ICD-10-CM | POA: Diagnosis not present

## 2023-10-22 DIAGNOSIS — M25561 Pain in right knee: Secondary | ICD-10-CM | POA: Diagnosis not present

## 2023-10-22 NOTE — Progress Notes (Signed)
 Remote Loop Recorder Transmission

## 2023-10-23 NOTE — Progress Notes (Signed)
 Remote Loop Recorder Transmission

## 2023-10-29 DIAGNOSIS — M25562 Pain in left knee: Secondary | ICD-10-CM | POA: Diagnosis not present

## 2023-10-29 DIAGNOSIS — R262 Difficulty in walking, not elsewhere classified: Secondary | ICD-10-CM | POA: Diagnosis not present

## 2023-10-29 DIAGNOSIS — M25561 Pain in right knee: Secondary | ICD-10-CM | POA: Diagnosis not present

## 2023-10-29 DIAGNOSIS — M170 Bilateral primary osteoarthritis of knee: Secondary | ICD-10-CM | POA: Diagnosis not present

## 2023-11-02 ENCOUNTER — Encounter: Payer: Self-pay | Admitting: Internal Medicine

## 2023-11-02 NOTE — Progress Notes (Unsigned)
 Subjective:    Patient ID: Amy [name], female    DOB: 11/24/1935, 88 y.o.   MRN: [id number]     HPI [name] is here for follow up of her chronic medical problems.  She denies any back pain.  [facility] - knee OA - gel injections  BP last night 179/105 - she took an extra amlodipine .  Occasionally will spike up, but typically well-controlled.  No falls.   Taking melatonin and tylenol  pm.  No daytime naps.  Bed at 11pm - can take 1-2 hours to sleep.  Gets up 2-3 times to urinate at night - gets back to sleep w/o difficulty.   Wakes up at 7-8 am.  Denies being tired during day.     Medications and allergies reviewed with patient and updated if appropriate.  Current Outpatient Medications on File Prior to Visit  Medication Sig Dispense Refill   acetaminophen  (TYLENOL ) 500 MG tablet Take 2 tablets (1,000 mg total) by mouth every 6 (six) hours. 30 tablet 0   amLODipine  (NORVASC ) 2.5 MG tablet Take 2.5 mg by mouth 2 (two) times daily.     atenolol  (TENORMIN ) 25 MG tablet Take 0.5 tablets (12.5 mg total) by mouth daily.     Calcium  Carbonate-Vitamin D  (CALCIUM  + D PO) Take 1 tablet by mouth daily.     dorzolamide (TRUSOPT) 2 % ophthalmic solution 1 drop every morning.     levothyroxine  (SYNTHROID ) 88 MCG tablet Take 1 tablet (88 mcg total) by mouth daily. 90 tablet 3   melatonin 5 MG TABS Take 5 mg by mouth at bedtime.     oxyCODONE  (ROXICODONE ) 5 MG immediate release tablet Take 1 tablet (5 mg total) by mouth daily as needed for severe pain (pain score 7-10) (For chronic back pain). 30 tablet 0   Rivaroxaban  (XARELTO ) 15 MG TABS tablet Take 1 tablet (15 mg total) by mouth daily with supper. 90 tablet 1   furosemide  (LASIX ) 40 MG tablet Take 1 tablet (40 mg total) by mouth once a week. (Patient not taking: Reported on 11/03/2023) 30 tablet 0   linaclotide  (LINZESS ) 72 MCG capsule Take 1 capsule (72 mcg total) by mouth as needed (for constipation). (Patient not  taking: Reported on 08/28/2023) 30 capsule 0   polyethylene glycol (MIRALAX  / GLYCOLAX ) 17 g packet Take 17 g by mouth 2 (two) times daily. (Patient not taking: Reported on 11/03/2023) 14 each 0   traZODone  (DESYREL ) 50 MG tablet Take 0.5-1 tablets (25-50 mg total) by mouth at bedtime. (Patient not taking: Reported on 11/03/2023) 30 tablet 5   Current Facility-Administered Medications on File Prior to Visit  Medication Dose Route Frequency Provider Last Rate Last Admin   Romosozumab -aqqg (EVENITY ) 105 MG/1. injection 210 mg  210 mg Subcutaneous Once Geofm Glade PARAS, MD       Romosozumab -aqqg (EVENITY ) 105 MG/1. injection 210 mg  210 mg Subcutaneous Once Geofm Glade PARAS, MD         Review of Systems  Constitutional:  Positive for appetite change (decreased). Negative for fever.  Respiratory:  Negative for cough, shortness of breath and wheezing.   Cardiovascular:  Negative for chest pain, palpitations and leg swelling.  Gastrointestinal:  Negative for abdominal pain, constipation and diarrhea.  Neurological:  Negative for dizziness, light-headedness and headaches.       Objective:   Vitals:   11/03/23 1034  BP: 114/72  Pulse: 68  Temp: 98 F (36.7 C)  SpO2: 91%  BP Readings from Last 3 Encounters:  11/03/23 114/72  08/28/23 124/80  06/19/23 118/65   Wt Readings from Last 3 Encounters:  11/03/23 125 lb (56.7 kg)  08/28/23 117 lb 6.4 oz (53.3 kg)  06/19/23 112 lb (50.8 kg)   Body mass index is 19.58 kg/m.    Physical Exam Constitutional:      General: She is not in acute distress.    Appearance: Normal appearance.  HENT:     Head: Normocephalic and atraumatic.  Eyes:     Conjunctiva/sclera: Conjunctivae normal.  Cardiovascular:     Rate and Rhythm: Normal rate and regular rhythm.     Heart sounds: Normal heart sounds.  Pulmonary:     Effort: Pulmonary effort is normal. No respiratory distress.     Breath sounds: Normal breath sounds. No wheezing.   Musculoskeletal:     Cervical back: Neck supple.     Right lower leg: No edema.     Left lower leg: No edema.  Lymphadenopathy:     Cervical: No cervical adenopathy.  Skin:    General: Skin is warm and dry.     Findings: No rash.  Neurological:     Mental Status: She is alert. Mental status is at baseline.  Psychiatric:        Mood and Affect: Mood normal.        Behavior: Behavior normal.        Lab Results  Component Value Date   WBC 7.2 04/21/2023   HGB 12.1 04/21/2023   HCT 37.0 04/21/2023   PLT 252 04/21/2023   GLUCOSE 98 07/10/2023   CHOL 196 05/01/2022   TRIG 96.0 05/01/2022   HDL 93.50 05/01/2022   LDLDIRECT 84.9 11/15/2008   LDLCALC 83 05/01/2022   ALT 16 07/10/2023   AST 20 07/10/2023   NA 140 07/10/2023   K 4.5 07/10/2023   CL 105 07/10/2023   CREATININE 1.38 (H) 07/10/2023   BUN 24 07/10/2023   CO2 23 07/10/2023   TSH 3.890 07/10/2023   INR 1.0 07/18/2022   HGBA1C 4.8 05/01/2022   MICROALBUR 12.7 (H) 03/18/2023     Assessment & Plan:    See Problem List for Assessment and Plan of chronic medical problems.

## 2023-11-03 ENCOUNTER — Encounter (HOSPITAL_COMMUNITY): Payer: Self-pay

## 2023-11-03 ENCOUNTER — Ambulatory Visit: Admitting: Internal Medicine

## 2023-11-03 ENCOUNTER — Telehealth: Payer: Self-pay

## 2023-11-03 ENCOUNTER — Other Ambulatory Visit: Payer: Self-pay

## 2023-11-03 ENCOUNTER — Other Ambulatory Visit (HOSPITAL_COMMUNITY): Payer: Self-pay

## 2023-11-03 VITALS — BP 114/72 | HR 68 | Temp 98.0°F | Ht 67.0 in | Wt 125.0 lb

## 2023-11-03 DIAGNOSIS — E039 Hypothyroidism, unspecified: Secondary | ICD-10-CM | POA: Diagnosis not present

## 2023-11-03 DIAGNOSIS — E538 Deficiency of other specified B group vitamins: Secondary | ICD-10-CM | POA: Diagnosis not present

## 2023-11-03 DIAGNOSIS — N1832 Chronic kidney disease, stage 3b: Secondary | ICD-10-CM

## 2023-11-03 DIAGNOSIS — I10 Essential (primary) hypertension: Secondary | ICD-10-CM

## 2023-11-03 DIAGNOSIS — I4821 Permanent atrial fibrillation: Secondary | ICD-10-CM | POA: Diagnosis not present

## 2023-11-03 DIAGNOSIS — F5101 Primary insomnia: Secondary | ICD-10-CM

## 2023-11-03 DIAGNOSIS — M8000XA Age-related osteoporosis with current pathological fracture, unspecified site, initial encounter for fracture: Secondary | ICD-10-CM | POA: Diagnosis not present

## 2023-11-03 LAB — CBC WITH DIFFERENTIAL/PLATELET
Basophils Absolute: 0 K/uL (ref 0.0–0.1)
Basophils Relative: 0.7 % (ref 0.0–3.0)
Eosinophils Absolute: 0.1 K/uL (ref 0.0–0.7)
Eosinophils Relative: 1.4 % (ref 0.0–5.0)
HCT: 35.2 % — ABNORMAL LOW (ref 36.0–46.0)
Hemoglobin: 11.4 g/dL — ABNORMAL LOW (ref 12.0–15.0)
Lymphocytes Relative: 18.1 % (ref 12.0–46.0)
Lymphs Abs: 1.3 K/uL (ref 0.7–4.0)
MCHC: 32.4 g/dL (ref 30.0–36.0)
MCV: 98.7 fl (ref 78.0–100.0)
Monocytes Absolute: 1 K/uL (ref 0.1–1.0)
Monocytes Relative: 14 % — ABNORMAL HIGH (ref 3.0–12.0)
Neutro Abs: 4.9 K/uL (ref 1.4–7.7)
Neutrophils Relative %: 65.8 % (ref 43.0–77.0)
Platelets: 240 K/uL (ref 150.0–400.0)
RBC: 3.57 Mil/uL — ABNORMAL LOW (ref 3.87–5.11)
RDW: 14.3 % (ref 11.5–15.5)
WBC: 7.4 K/uL (ref 4.0–10.5)

## 2023-11-03 LAB — COMPREHENSIVE METABOLIC PANEL WITH GFR
ALT: 12 U/L (ref 0–35)
AST: 18 U/L (ref 0–37)
Albumin: 4.5 g/dL (ref 3.5–5.2)
Alkaline Phosphatase: 105 U/L (ref 39–117)
BUN: 14 mg/dL (ref 6–23)
CO2: 27 meq/L (ref 19–32)
Calcium: 9.5 mg/dL (ref 8.4–10.5)
Chloride: 105 meq/L (ref 96–112)
Creatinine, Ser: 1.05 mg/dL (ref 0.40–1.20)
GFR: 48.19 mL/min — ABNORMAL LOW (ref >60.00)
Glucose, Bld: 91 mg/dL (ref 70–99)
Potassium: 4 meq/L (ref 3.5–5.1)
Sodium: 140 meq/L (ref 135–145)
Total Bilirubin: 0.7 mg/dL (ref 0.2–1.2)
Total Protein: 7.4 g/dL (ref 6.0–8.3)

## 2023-11-03 LAB — TSH: TSH: 0.86 u[IU]/mL (ref 0.35–5.50)

## 2023-11-03 LAB — VITAMIN D 25 HYDROXY (VIT D DEFICIENCY, FRACTURES): VITD: 29.62 ng/mL — ABNORMAL LOW (ref 30.00–100.00)

## 2023-11-03 LAB — VITAMIN B12: Vitamin B-12: 971 pg/mL — ABNORMAL HIGH (ref 211–911)

## 2023-11-03 MED ORDER — ROMOSOZUMAB-AQQG 105 MG/1.17ML ~~LOC~~ SOSY
210.0000 mg | PREFILLED_SYRINGE | Freq: Once | SUBCUTANEOUS | Status: AC
  Administered 2023-11-17: 210 mg via SUBCUTANEOUS

## 2023-11-03 MED ORDER — ROMOSOZUMAB-AQQG 105 MG/1.17ML ~~LOC~~ SOSY
210.0000 mg | PREFILLED_SYRINGE | Freq: Once | SUBCUTANEOUS | 0 refills | Status: AC
  Filled 2023-11-03: qty 3.51, 1d supply, fill #0

## 2023-11-03 NOTE — Assessment & Plan Note (Addendum)
 Chronic Has had several fractures Evenity  monthly x 12 then will go back to Prolia Q 6 months Evenity  due  Fall precautions-uses rollator consistently and advised to continue to use that Continue calcium  and vitamin D  supplementation Check vitamin D  level

## 2023-11-03 NOTE — Telephone Encounter (Signed)
 Referral completed

## 2023-11-03 NOTE — Assessment & Plan Note (Signed)
 Chronic Currently taking melatonin and Tylenol  PM At 1 point was on trazodone  Advised do not take Tylenol  PM because of concerns with that affecting memory Typically goes to bed around 11 PM and takes 1-2 hours to fall asleep, may get up 2-3 times to urinate but able to get back to sleep and wakes up at 7-8:00 in the morning Denies any tiredness during the day He would not recommend taking anything for sleep if she is able to go without medication just because of potential side effects

## 2023-11-03 NOTE — Telephone Encounter (Signed)
 Evenity  is not covered through pharmacy benefits-PA was denied. It is covered through medical benefits. Patient's out of pocket cost would be $507.41

## 2023-11-03 NOTE — Assessment & Plan Note (Signed)
 Chronic  Clinically euthyroid Check tsh and will titrate med dose if needed Continue levothyroxine  88 mcg daily

## 2023-11-03 NOTE — Patient Instructions (Addendum)
      Blood work was ordered.       Medications changes include :   None     Return in about 6 months (around 05/03/2024) for Physical Exam.

## 2023-11-03 NOTE — Assessment & Plan Note (Signed)
 Chronic Following with cardiology-Dr. Okey On atenolol  12.5 mg daily, amlodipine  2.5 mg twice daily, Xarelto  15 mg every day CBC, CMP

## 2023-11-03 NOTE — Assessment & Plan Note (Signed)
 Chronic Will see nephrology annually - Dr Macel Hibbs Not taking any NSAIDs She has increased her water intake Nutrition slightly better CMP, CBC

## 2023-11-03 NOTE — Assessment & Plan Note (Signed)
 Chronic Continue oral B12 1000 mcg daily Check B12 level

## 2023-11-03 NOTE — Assessment & Plan Note (Addendum)
 Chronic Blood pressure well-controlled today Occasional spikes and takes an extra amlodipine  if needed Continue amlodipine  2.5 mg twice daily, atenolol  12.5 mg daily

## 2023-11-05 ENCOUNTER — Other Ambulatory Visit: Payer: Self-pay

## 2023-11-05 DIAGNOSIS — M25561 Pain in right knee: Secondary | ICD-10-CM | POA: Diagnosis not present

## 2023-11-05 DIAGNOSIS — M25562 Pain in left knee: Secondary | ICD-10-CM | POA: Diagnosis not present

## 2023-11-05 DIAGNOSIS — R262 Difficulty in walking, not elsewhere classified: Secondary | ICD-10-CM | POA: Diagnosis not present

## 2023-11-05 DIAGNOSIS — M170 Bilateral primary osteoarthritis of knee: Secondary | ICD-10-CM | POA: Diagnosis not present

## 2023-11-05 NOTE — Progress Notes (Signed)
 Office will do buy and bill. Dis-enrolling.

## 2023-11-05 NOTE — Telephone Encounter (Signed)
 Okay to scheduled patient for monthly Evenity  at $40 out of pocket, for the remainder of 2025.

## 2023-11-07 ENCOUNTER — Ambulatory Visit: Payer: Self-pay | Admitting: Internal Medicine

## 2023-11-12 DIAGNOSIS — M170 Bilateral primary osteoarthritis of knee: Secondary | ICD-10-CM | POA: Diagnosis not present

## 2023-11-12 DIAGNOSIS — R262 Difficulty in walking, not elsewhere classified: Secondary | ICD-10-CM | POA: Diagnosis not present

## 2023-11-12 DIAGNOSIS — M25562 Pain in left knee: Secondary | ICD-10-CM | POA: Diagnosis not present

## 2023-11-12 DIAGNOSIS — M25561 Pain in right knee: Secondary | ICD-10-CM | POA: Diagnosis not present

## 2023-11-17 ENCOUNTER — Ambulatory Visit (INDEPENDENT_AMBULATORY_CARE_PROVIDER_SITE_OTHER)

## 2023-11-17 DIAGNOSIS — M8000XA Age-related osteoporosis with current pathological fracture, unspecified site, initial encounter for fracture: Secondary | ICD-10-CM | POA: Diagnosis not present

## 2023-11-17 MED ORDER — ROMOSOZUMAB-AQQG 105 MG/1.17ML ~~LOC~~ SOSY: 210.0000 mg | PREFILLED_SYRINGE | SUBCUTANEOUS | Status: AC

## 2023-11-17 NOTE — Progress Notes (Signed)
Pt was given Evenity injection with no complications.

## 2023-11-20 ENCOUNTER — Ambulatory Visit (INDEPENDENT_AMBULATORY_CARE_PROVIDER_SITE_OTHER)

## 2023-11-20 DIAGNOSIS — I4891 Unspecified atrial fibrillation: Secondary | ICD-10-CM

## 2023-11-20 LAB — CUP PACEART REMOTE DEVICE CHECK
Date Time Interrogation Session: 20251029232928
Implantable Pulse Generator Implant Date: 20240329

## 2023-11-20 NOTE — Progress Notes (Signed)
 Remote Loop Recorder Transmission

## 2023-11-24 ENCOUNTER — Encounter: Payer: Self-pay | Admitting: Radiology

## 2023-11-27 ENCOUNTER — Ambulatory Visit: Payer: Self-pay | Admitting: Internal Medicine

## 2023-12-21 ENCOUNTER — Ambulatory Visit

## 2023-12-22 ENCOUNTER — Encounter

## 2023-12-23 ENCOUNTER — Ambulatory Visit

## 2023-12-23 DIAGNOSIS — I4891 Unspecified atrial fibrillation: Secondary | ICD-10-CM

## 2023-12-24 LAB — CUP PACEART REMOTE DEVICE CHECK
Date Time Interrogation Session: 20251201232403
Implantable Pulse Generator Implant Date: 20240329

## 2023-12-26 ENCOUNTER — Ambulatory Visit: Payer: Self-pay | Admitting: Internal Medicine

## 2023-12-26 NOTE — Progress Notes (Signed)
 Remote Loop Recorder Transmission

## 2024-01-21 ENCOUNTER — Ambulatory Visit

## 2024-01-23 ENCOUNTER — Ambulatory Visit: Attending: Internal Medicine

## 2024-01-23 DIAGNOSIS — I4891 Unspecified atrial fibrillation: Secondary | ICD-10-CM | POA: Diagnosis not present

## 2024-01-24 LAB — CUP PACEART REMOTE DEVICE CHECK
Date Time Interrogation Session: 20260101231559
Implantable Pulse Generator Implant Date: 20240329

## 2024-01-25 ENCOUNTER — Ambulatory Visit: Payer: Self-pay | Admitting: Internal Medicine

## 2024-01-26 ENCOUNTER — Ambulatory Visit: Admitting: Orthopedic Surgery

## 2024-01-26 ENCOUNTER — Encounter: Payer: Self-pay | Admitting: Orthopedic Surgery

## 2024-01-26 ENCOUNTER — Other Ambulatory Visit (INDEPENDENT_AMBULATORY_CARE_PROVIDER_SITE_OTHER)

## 2024-01-26 VITALS — BP 114/72 | Ht 67.0 in | Wt 125.0 lb

## 2024-01-26 DIAGNOSIS — S52502A Unspecified fracture of the lower end of left radius, initial encounter for closed fracture: Secondary | ICD-10-CM

## 2024-01-26 DIAGNOSIS — S52602A Unspecified fracture of lower end of left ulna, initial encounter for closed fracture: Secondary | ICD-10-CM

## 2024-01-26 NOTE — Progress Notes (Signed)
" ° °  Patient: Amy [name]           Date of Birth: [birth year]           MRN: [id number] Visit Date: 01/26/2024 Requested by: Geofm Glade PARAS, MD [address] [place],  [place] [address] PCP: Geofm Glade PARAS, MD  Encounter Diagnosis  Name Primary?   Closed fracture of left distal radius and ulna, initial encounter Yes    Assessment and plan:  [address]  89 year old female previous fracture left wrist new fracture left wrist recommend cast  X-ray out of plaster 4 weeks  No orders of the defined types were placed in this encounter.  Blood thinner: Yes: Xarelto    Chief Complaint  Patient presents with   Wrist Injury    left   Problem list, medical hx, medications and allergies reviewed    History:  This is an 89 year old female with a history of prior left distal radius fracture treated nonoperatively presents after a fall on December 30.  Seen at urgent care.  Placed on a splint it was removable splint she took it off.  She uses a walker for weightbearing has difficulty getting out of a chair so bears weight through her wrist  Presents for evaluation and management.  Complains of left wrist pain  Focused exam findings:  She is tender over the distal radius and ulna minimal deformity there is some ulnar prominence from the prior fracture wrist range of motion is diminished because of pain neurovascular exam is intact  DG Wrist Complete Left Result Date: 01/26/2024 Imaging left wrist.  Patient fractured her left wrist about a week ago.  She went to urgent care she has an impacted fracture.  She also has a prior injury to the left wrist when she injured her left elbow Today's x-ray shows that the left wrist has an impacted fracture with ulnar positive variance.  Minimal angulation Impression history of prior fracture left distal radius with ulnar positive variance and new transverse impacted distal radius fracture nondisplaced nonangulated      "

## 2024-01-26 NOTE — Progress Notes (Signed)
" °  Intake history:  Chief Complaint  Patient presents with   Wrist Injury    left     BP 114/72 Comment: 11/03/23 BP error x 2 today incorrect cuff size need small only have regular / large  Ht 5' 7 (1.702 m)   Wt 125 lb (56.7 kg)   LMP  (LMP Unknown)   BMI 19.58 kg/m  Body mass index is 19.58 kg/m.  Pharmacy? ____[facility]__________________________________  WHAT ARE WE SEEING YOU FOR TODAY?   Left wrist  How long has this bothered you? (DOI?DOS?WS?)  on 01/21/24  Was there an injury? Yes  Anticoag.  Yes   Any ALLERGIES ________Allergies[1] ______________________________________   Treatment:  Have you taken:  Tylenol  No  Advil No  Had PT No  Had injection No  Other  ___splint from Urgent care increased pain/ not wearing it / has xray disc and report __________        [1] No Known Allergies  "

## 2024-01-29 NOTE — Progress Notes (Signed)
 Remote Loop Recorder Transmission

## 2024-02-21 ENCOUNTER — Ambulatory Visit

## 2024-02-23 ENCOUNTER — Encounter: Admitting: Orthopedic Surgery

## 2024-02-23 ENCOUNTER — Ambulatory Visit

## 2024-02-24 LAB — CUP PACEART REMOTE DEVICE CHECK
Date Time Interrogation Session: 20260201233828
Implantable Pulse Generator Implant Date: 20240329

## 2024-02-26 ENCOUNTER — Other Ambulatory Visit: Payer: Self-pay

## 2024-02-26 ENCOUNTER — Encounter: Payer: Self-pay | Admitting: Orthopedic Surgery

## 2024-02-26 ENCOUNTER — Ambulatory Visit: Admitting: Orthopedic Surgery

## 2024-02-26 DIAGNOSIS — S52502D Unspecified fracture of the lower end of left radius, subsequent encounter for closed fracture with routine healing: Secondary | ICD-10-CM

## 2024-02-26 NOTE — Progress Notes (Signed)
 FRACTURE CARE FOLLOW UP   Patient: Amy [name]           Date of Birth: [birth year]           MRN: [id number] Visit Date: 02/26/2024 Requested by: Geofm Glade PARAS, MD [address] [place],  [place] [address] PCP: Geofm Glade PARAS, MD  Encounter Diagnosis  Name Primary?   Closed fracture distal radius and ulna, left, with routine healing, subsequent encounter 01/20/24 Yes    DOI: January 20, 2024  GLOBAL PERIOD: January 26, 2024 to April 25, 2024  Chief Complaint  Patient presents with   Wrist Injury    Left /cast removed, has small skin tear from cast removal, from scissors against padding/ pinched. Steristrip placed     Cast off x-ray DG Wrist Complete Left Result Date: 02/26/2024 Pain left hand image report of left wrist Chief complaint fracture left wrist Patient had a prior fracture in the left distal radius and then a new fracture with dorsal angulation scapholunate widening.  The scapholunate widening may be chronic Slight dorsal tilt definite shortening noted increased bony sclerosis around the fracture area Healing fracture distal radius with dorsal tilt and shortening.  Note previous fracture also same wrist     Patient is doing well.  Removing the cast the patient sustained a skin tear we treated with the Steri-Strip  X-rays show fracture healing  Recommend removable Velcro splint   TREATMENT PLAN   X-ray on return

## 2024-02-26 NOTE — Progress Notes (Signed)
" ° ° °  02/26/2024   Chief Complaint  Patient presents with   Wrist Injury    Left /cast removed, has small skin tear from cast removal, from scissors against padding/ pinched. Steristrip placed     Encounter Diagnosis  Name Primary?   Closed fracture distal radius and ulna, left, with routine healing, subsequent encounter 01/20/24 Yes    What pharmacy do you use ? [facility]________  DOI/DOS/ Date: 01/20/24  Did you get better, worse or no change (Answer below)   Improved      "

## 2024-03-25 ENCOUNTER — Encounter: Admitting: Orthopedic Surgery

## 2024-03-25 ENCOUNTER — Ambulatory Visit

## 2024-04-25 ENCOUNTER — Ambulatory Visit

## 2024-05-04 ENCOUNTER — Ambulatory Visit

## 2024-05-04 ENCOUNTER — Encounter: Admitting: Internal Medicine

## 2024-05-26 ENCOUNTER — Ambulatory Visit

## 2024-06-04 ENCOUNTER — Ambulatory Visit

## 2024-06-26 ENCOUNTER — Ambulatory Visit

## 2024-07-27 ENCOUNTER — Ambulatory Visit

## 2024-08-27 ENCOUNTER — Ambulatory Visit

## 2024-09-27 ENCOUNTER — Ambulatory Visit

## 2024-10-28 ENCOUNTER — Ambulatory Visit

## 2024-11-28 ENCOUNTER — Ambulatory Visit

## 2024-12-29 ENCOUNTER — Ambulatory Visit

## 2025-01-29 ENCOUNTER — Ambulatory Visit
